# Patient Record
Sex: Female | Born: 1945
Health system: Southern US, Community
[De-identification: ages and names within clinical notes are randomized; demographics above are authoritative.]

## PROBLEM LIST (undated history)

## (undated) DIAGNOSIS — I10 Essential (primary) hypertension: Secondary | ICD-10-CM

## (undated) DIAGNOSIS — I5032 Chronic diastolic (congestive) heart failure: Secondary | ICD-10-CM

## (undated) DIAGNOSIS — I2510 Atherosclerotic heart disease of native coronary artery without angina pectoris: Secondary | ICD-10-CM

## (undated) DIAGNOSIS — R0609 Other forms of dyspnea: Secondary | ICD-10-CM

## (undated) DIAGNOSIS — R001 Bradycardia, unspecified: Secondary | ICD-10-CM

## (undated) DIAGNOSIS — Z7982 Long term (current) use of aspirin: Secondary | ICD-10-CM

## (undated) DIAGNOSIS — C4490 Unspecified malignant neoplasm of skin, unspecified: Secondary | ICD-10-CM

## (undated) DIAGNOSIS — M5136 Other intervertebral disc degeneration, lumbar region: Secondary | ICD-10-CM

## (undated) DIAGNOSIS — M51369 Other intervertebral disc degeneration, lumbar region without mention of lumbar back pain or lower extremity pain: Secondary | ICD-10-CM

## (undated) DIAGNOSIS — G8929 Other chronic pain: Secondary | ICD-10-CM

## (undated) DIAGNOSIS — I700 Atherosclerosis of aorta: Secondary | ICD-10-CM

## (undated) DIAGNOSIS — M1990 Unspecified osteoarthritis, unspecified site: Secondary | ICD-10-CM

## (undated) DIAGNOSIS — M5416 Radiculopathy, lumbar region: Secondary | ICD-10-CM

## (undated) DIAGNOSIS — M4317 Spondylolisthesis, lumbosacral region: Secondary | ICD-10-CM

## (undated) DIAGNOSIS — I739 Peripheral vascular disease, unspecified: Secondary | ICD-10-CM

## (undated) DIAGNOSIS — E7800 Pure hypercholesterolemia, unspecified: Secondary | ICD-10-CM

## (undated) DIAGNOSIS — M75121 Complete rotator cuff tear or rupture of right shoulder, not specified as traumatic: Secondary | ICD-10-CM

## (undated) DIAGNOSIS — M4310 Spondylolisthesis, site unspecified: Secondary | ICD-10-CM

## (undated) DIAGNOSIS — I779 Disorder of arteries and arterioles, unspecified: Secondary | ICD-10-CM

## (undated) DIAGNOSIS — C801 Malignant (primary) neoplasm, unspecified: Secondary | ICD-10-CM

## (undated) DIAGNOSIS — G4700 Insomnia, unspecified: Secondary | ICD-10-CM

## (undated) DIAGNOSIS — R002 Palpitations: Secondary | ICD-10-CM

## (undated) DIAGNOSIS — M79606 Pain in leg, unspecified: Secondary | ICD-10-CM

## (undated) DIAGNOSIS — R0600 Dyspnea, unspecified: Secondary | ICD-10-CM

## (undated) DIAGNOSIS — G3184 Mild cognitive impairment, so stated: Secondary | ICD-10-CM

## (undated) DIAGNOSIS — M5410 Radiculopathy, site unspecified: Secondary | ICD-10-CM

## (undated) DIAGNOSIS — R5382 Chronic fatigue, unspecified: Secondary | ICD-10-CM

## (undated) HISTORY — DX: Other chronic pain: G89.29

## (undated) HISTORY — DX: Peripheral vascular disease, unspecified: I73.9

## (undated) HISTORY — DX: Essential (primary) hypertension: I10

## (undated) HISTORY — DX: Bradycardia, unspecified: R00.1

## (undated) HISTORY — DX: Other forms of dyspnea: R06.09

## (undated) HISTORY — DX: Other intervertebral disc degeneration, lumbar region without mention of lumbar back pain or lower extremity pain: M51.369

## (undated) HISTORY — DX: Palpitations: R00.2

## (undated) HISTORY — PX: RECONSTRUCTION OF NOSE: SHX2301

## (undated) HISTORY — DX: Radiculopathy, site unspecified: M54.10

## (undated) HISTORY — DX: Dyspnea, unspecified: R06.00

## (undated) HISTORY — DX: Pure hypercholesterolemia, unspecified: E78.00

## (undated) HISTORY — DX: Pain in leg, unspecified: M79.606

## (undated) HISTORY — DX: Other intervertebral disc degeneration, lumbar region: M51.36

## (undated) HISTORY — DX: Disorder of arteries and arterioles, unspecified: I77.9

## (undated) HISTORY — PX: BACK SURGERY: SHX140

## (undated) HISTORY — DX: Chronic fatigue, unspecified: R53.82

## (undated) HISTORY — DX: Spondylolisthesis, site unspecified: M43.10

## (undated) HISTORY — DX: Insomnia, unspecified: G47.00

---

## 1973-10-21 HISTORY — PX: ABDOMINAL HYSTERECTOMY: SHX81

## 2004-10-01 ENCOUNTER — Ambulatory Visit: Payer: Self-pay | Admitting: Internal Medicine

## 2005-10-01 ENCOUNTER — Ambulatory Visit: Payer: Self-pay | Admitting: Internal Medicine

## 2005-12-04 ENCOUNTER — Ambulatory Visit: Payer: Self-pay | Admitting: Otolaryngology

## 2006-10-07 ENCOUNTER — Ambulatory Visit: Payer: Self-pay | Admitting: Internal Medicine

## 2007-11-03 ENCOUNTER — Ambulatory Visit: Payer: Self-pay | Admitting: Internal Medicine

## 2008-11-03 ENCOUNTER — Ambulatory Visit: Payer: Self-pay | Admitting: Internal Medicine

## 2010-09-11 ENCOUNTER — Ambulatory Visit: Payer: Self-pay | Admitting: Internal Medicine

## 2011-11-03 LAB — HM MAMMOGRAPHY

## 2011-11-12 ENCOUNTER — Ambulatory Visit: Payer: Self-pay | Admitting: Internal Medicine

## 2012-01-12 ENCOUNTER — Ambulatory Visit: Payer: Self-pay | Admitting: Internal Medicine

## 2012-01-12 LAB — RAPID STREP-A WITH REFLX: Micro Text Report: NEGATIVE

## 2012-01-14 LAB — BETA STREP CULTURE(ARMC)

## 2012-08-19 ENCOUNTER — Other Ambulatory Visit (HOSPITAL_COMMUNITY)
Admission: RE | Admit: 2012-08-19 | Discharge: 2012-08-19 | Disposition: A | Discharge status: Home / Self Care | Payer: Medicare Other | Source: Ambulatory Visit | Attending: Internal Medicine | Admitting: Internal Medicine

## 2012-08-19 ENCOUNTER — Ambulatory Visit (INDEPENDENT_AMBULATORY_CARE_PROVIDER_SITE_OTHER): Payer: Medicare Other | Admitting: Internal Medicine

## 2012-08-19 ENCOUNTER — Encounter: Payer: Self-pay | Admitting: Internal Medicine

## 2012-08-19 VITALS — BP 128/76 | HR 67 | Temp 97.0°F | Ht 65.0 in | Wt 180.0 lb

## 2012-08-19 DIAGNOSIS — E7800 Pure hypercholesterolemia, unspecified: Secondary | ICD-10-CM

## 2012-08-19 DIAGNOSIS — Z1151 Encounter for screening for human papillomavirus (HPV): Secondary | ICD-10-CM | POA: Insufficient documentation

## 2012-08-19 DIAGNOSIS — R51 Headache: Secondary | ICD-10-CM | POA: Diagnosis not present

## 2012-08-19 DIAGNOSIS — Z01419 Encounter for gynecological examination (general) (routine) without abnormal findings: Secondary | ICD-10-CM

## 2012-08-19 DIAGNOSIS — I10 Essential (primary) hypertension: Secondary | ICD-10-CM | POA: Diagnosis not present

## 2012-08-19 DIAGNOSIS — R5381 Other malaise: Secondary | ICD-10-CM | POA: Diagnosis not present

## 2012-08-19 DIAGNOSIS — R5383 Other fatigue: Secondary | ICD-10-CM | POA: Diagnosis not present

## 2012-08-19 DIAGNOSIS — Z124 Encounter for screening for malignant neoplasm of cervix: Secondary | ICD-10-CM | POA: Diagnosis not present

## 2012-08-19 LAB — COMPREHENSIVE METABOLIC PANEL
ALT: 23 U/L (ref 0–35)
AST: 21 U/L (ref 0–37)
Albumin: 3.7 g/dL (ref 3.5–5.2)
Alkaline Phosphatase: 68 U/L (ref 39–117)
BUN: 13 mg/dL (ref 6–23)
CO2: 27 mEq/L (ref 19–32)
Calcium: 9.7 mg/dL (ref 8.4–10.5)
Chloride: 104 mEq/L (ref 96–112)
Creatinine, Ser: 0.6 mg/dL (ref 0.4–1.2)
GFR: 108.62 mL/min (ref >60.00)
Glucose, Bld: 80 mg/dL (ref 70–99)
Potassium: 3.9 mEq/L (ref 3.5–5.1)
Sodium: 139 mEq/L (ref 135–145)
Total Bilirubin: 0.9 mg/dL (ref 0.3–1.2)
Total Protein: 7 g/dL (ref 6.0–8.3)

## 2012-08-19 LAB — CBC WITH DIFFERENTIAL/PLATELET
Basophils Absolute: 0.1 10*3/uL (ref 0.0–0.1)
Basophils Relative: 1 % (ref 0.0–3.0)
Eosinophils Absolute: 0.1 10*3/uL (ref 0.0–0.7)
Eosinophils Relative: 1.5 % (ref 0.0–5.0)
HCT: 38.4 % (ref 36.0–46.0)
Hemoglobin: 12.7 g/dL (ref 12.0–15.0)
Lymphocytes Relative: 22.7 % (ref 12.0–46.0)
Lymphs Abs: 1.4 10*3/uL (ref 0.7–4.0)
MCHC: 33 g/dL (ref 30.0–36.0)
MCV: 90.3 fl (ref 78.0–100.0)
Monocytes Absolute: 0.4 10*3/uL (ref 0.1–1.0)
Monocytes Relative: 6.7 % (ref 3.0–12.0)
Neutro Abs: 4.1 10*3/uL (ref 1.4–7.7)
Neutrophils Relative %: 68.1 % (ref 43.0–77.0)
Platelets: 256 10*3/uL (ref 150.0–400.0)
RBC: 4.25 Mil/uL (ref 3.87–5.11)
RDW: 12.9 % (ref 11.5–14.6)
WBC: 6 10*3/uL (ref 4.5–10.5)

## 2012-08-19 LAB — TSH: TSH: 3.6 u[IU]/mL (ref 0.35–5.50)

## 2012-08-19 LAB — SEDIMENTATION RATE: Sed Rate: 25 mm/hr — ABNORMAL HIGH (ref 0–22)

## 2012-08-19 NOTE — Patient Instructions (Addendum)
It was nice seeing you today.  I am glad you have been doing well.  I am going to check labs today and we will notify you of your results - once they are available.

## 2012-08-23 ENCOUNTER — Encounter: Payer: Self-pay | Admitting: Internal Medicine

## 2012-08-23 DIAGNOSIS — E785 Hyperlipidemia, unspecified: Secondary | ICD-10-CM | POA: Insufficient documentation

## 2012-08-23 DIAGNOSIS — I10 Essential (primary) hypertension: Secondary | ICD-10-CM | POA: Insufficient documentation

## 2012-08-23 NOTE — Assessment & Plan Note (Addendum)
Blood pressure elevated.  Discussed changing her meds.  She desires not to change meds at this time.  Will have her monitor her blood pressure and send in readings.  If persistent elevation, will require adjustments in her meds. She declines earlier appt to follow up on her blood pressure.  Assures me she will send in readings.

## 2012-08-23 NOTE — Assessment & Plan Note (Addendum)
History of elevated cholesterol.  Low cholesterol diet and exercise.  Desires not to take a statin.  Check lipid panel with next fasting labs.

## 2012-08-23 NOTE — Progress Notes (Signed)
  Subjective:    Patient ID: Kristen Strickland, female    DOB: 1945-12-20, 66 y.o.   MRN: [id number]  HPI 66 year old female with past history of hypertension and hypercholesterolemia who comes in today to follow up on these issues as well as for a complete physical exam.  States she has been doing relatively well.  She has noticed headache.  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  This resolved.  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  Intermittent.  None today.  No sinus symptoms.  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  No vision change.  Ibuprofen helps.  No nausea or vomiting.  No abdominal pain.    Past Medical History  Diagnosis Date  . Hypercholesterolemia   . Hypertension     Review of Systems Headache as outlined.  None today.  No lightheadedness or dizziness.  No vision change.  No sinus symptoms.   No chest pain, tightness or palpitations.  No increased shortness of breath, cough or congestion.  No nausea or vomiting.  No abdominal pain or cramping.  No bowel change, such as diarrhea, constipation, BRBPR or melana.  No urine change.        Objective:   Physical Exam Filed Vitals:   08/19/12 0920  BP: 128/76  Pulse: 67  Temp: 97 F (69.103 C)   66 year old female in no acute distress.   HEENT:  Nares- clear.  Oropharynx - without lesions.  No sinus tenderness to palpation.  No tenderness to palpation over the temporal arteries.   NECK:  Supple.  Nontender.  No audible bruit.  HEART:  Appears to be regular. LUNGS:  No crackles or wheezing audible.  Respirations even and unlabored.  RADIAL PULSE:  Equal bilaterally.    BREASTS:  No nipple discharge or nipple retraction present.  Could not appreciate any distinct nodules or axillary adenopathy.  ABDOMEN:  Soft, nontender.  Bowel sounds present and normal.  No audible abdominal bruit.  GU:  Normal external genitalia.  Vaginal vault without lesions.  S/P hysterectomy.  Pap  performed. Could not appreciate any adnexal masses or tenderness.   RECTAL:  Heme negative.   EXTREMITIES:  No increased edema present.  DP pulses palpable and equal bilaterally.           Assessment & Plan:  HEADACHE.  Discussed at length with her today.  No significant sinus or allergy triggers.  No vision change.  Discussed need to get her eyes examined.  Check cbc and ESR.  Discussed scanning.  She declines.  Wants to monitor.  Will notify me or be reevaluated if persistent or change.    GYN.  Pelvic and pap today.    FATIGUE.  Check cbc, met c and tsh.   HEALTH MAINTENANCE.  Physical today.  Schedule mammogram.  Check cholesterol.  She declines colonoscopy.  Declines flu shot.

## 2012-09-01 ENCOUNTER — Encounter: Payer: Self-pay | Admitting: *Deleted

## 2012-09-02 ENCOUNTER — Encounter: Payer: Self-pay | Admitting: *Deleted

## 2012-09-04 ENCOUNTER — Encounter: Payer: Self-pay | Admitting: *Deleted

## 2012-09-04 NOTE — Progress Notes (Signed)
Result letter mailed to patient's home address.

## 2012-09-07 IMAGING — MG MM DIGITAL SCREENING BILAT W/ CAD
1 series · 4 of 4 positions shown · non-contrast
Comparison: none

REASON FOR EXAM: screening
COMMENTS:  Submitted by practice: [facility] [HOSPITAL] Scheduled by
[REDACTED]

PROCEDURE:     MMM - MMM DGTL SCREENING MAMMO W/CAD  - November 12, 2011  [DATE]
RESULT:     No dominant masses or pathologic clustered calcifications are
demonstrated.  The exam is stable from prior exams. CAD evaluation is
non-focal.

[Series 4875: R CC · right · 4 of 4 slices shown]
[im 1/4]
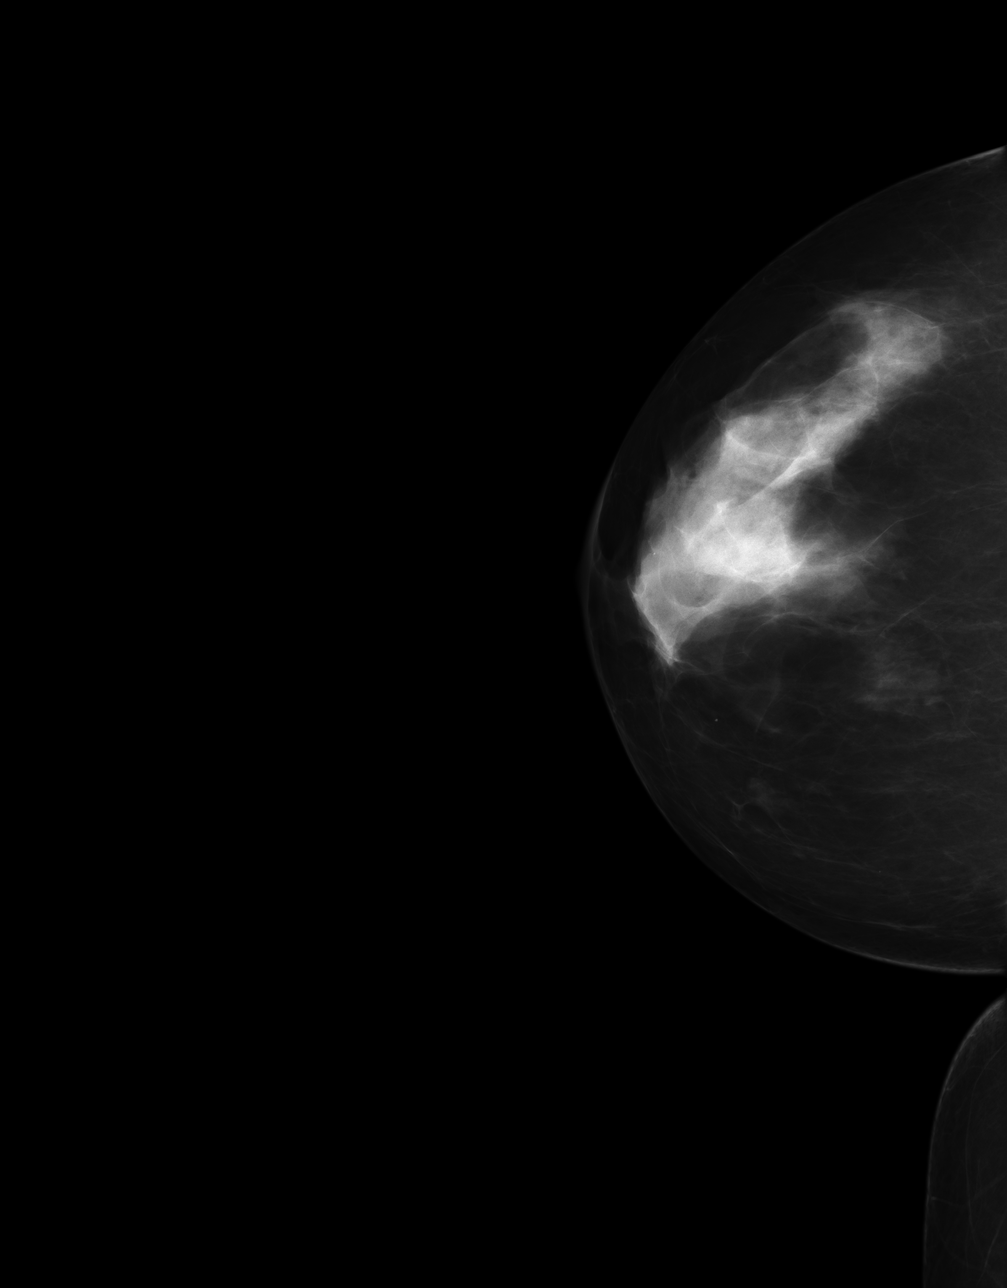
[im 2/4]
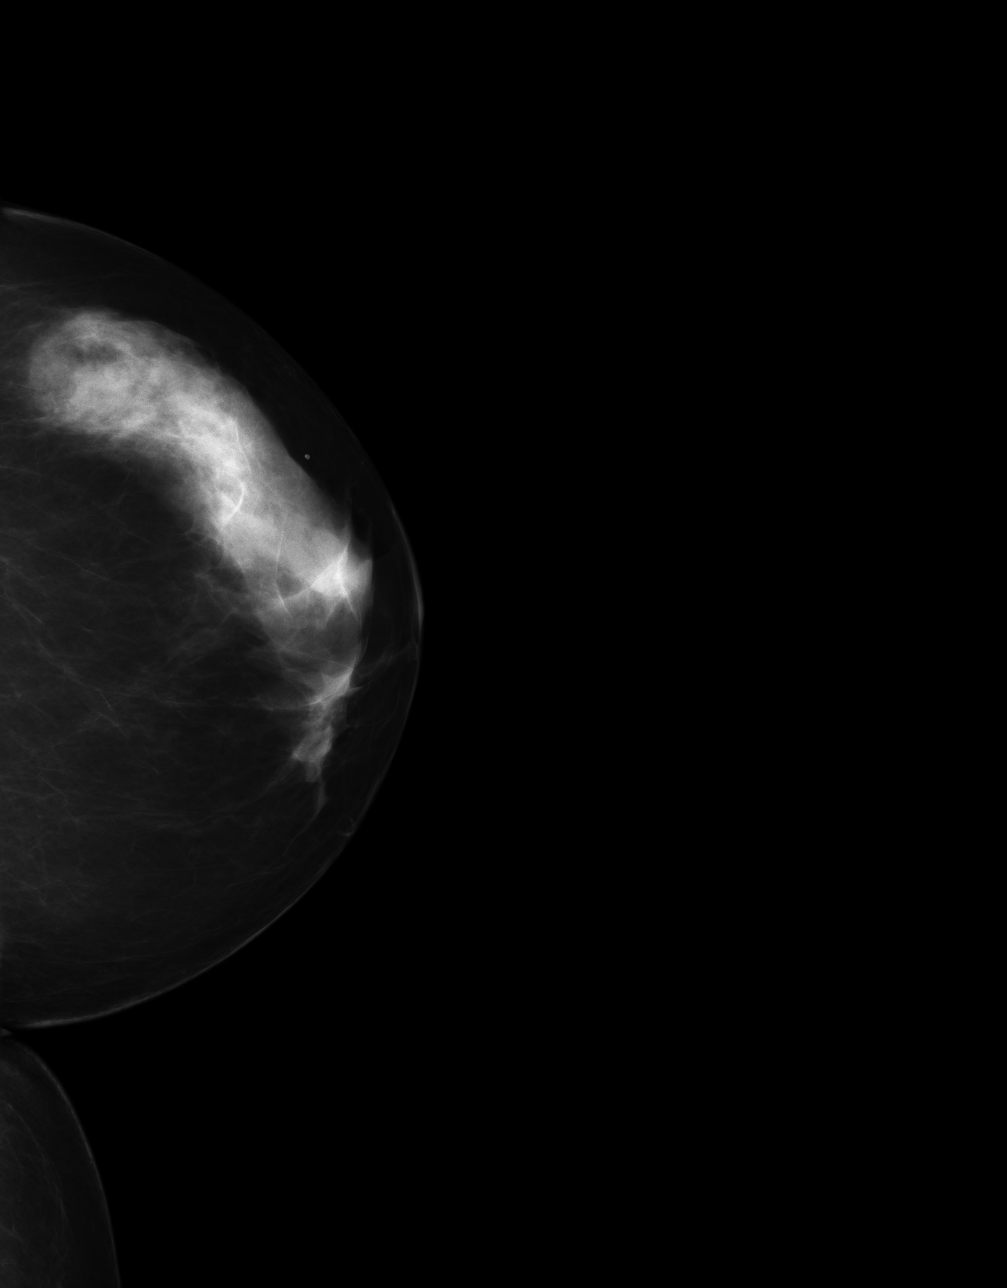
[im 3/4]
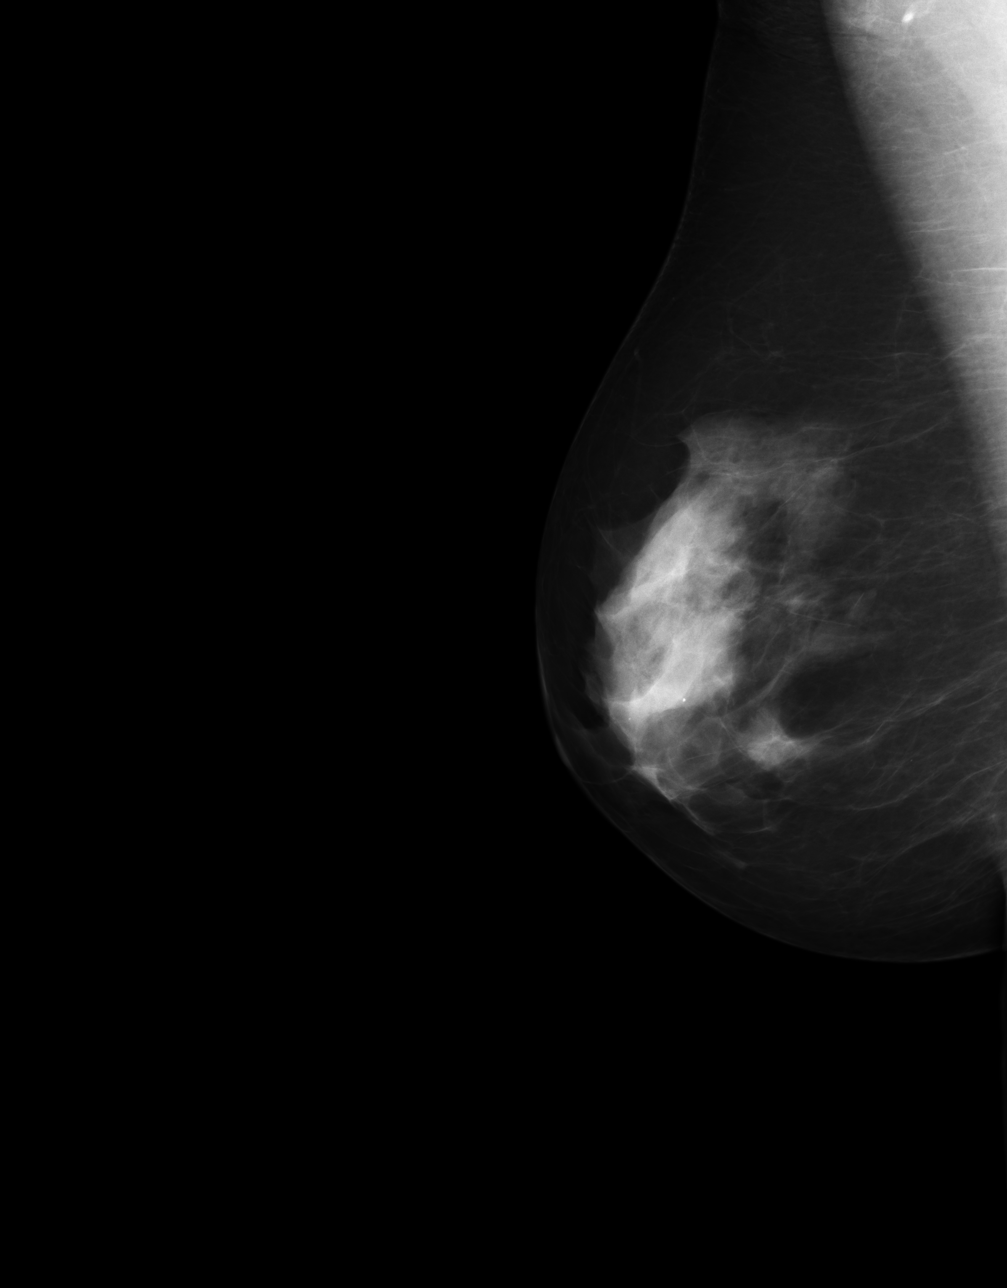
[im 4/4]
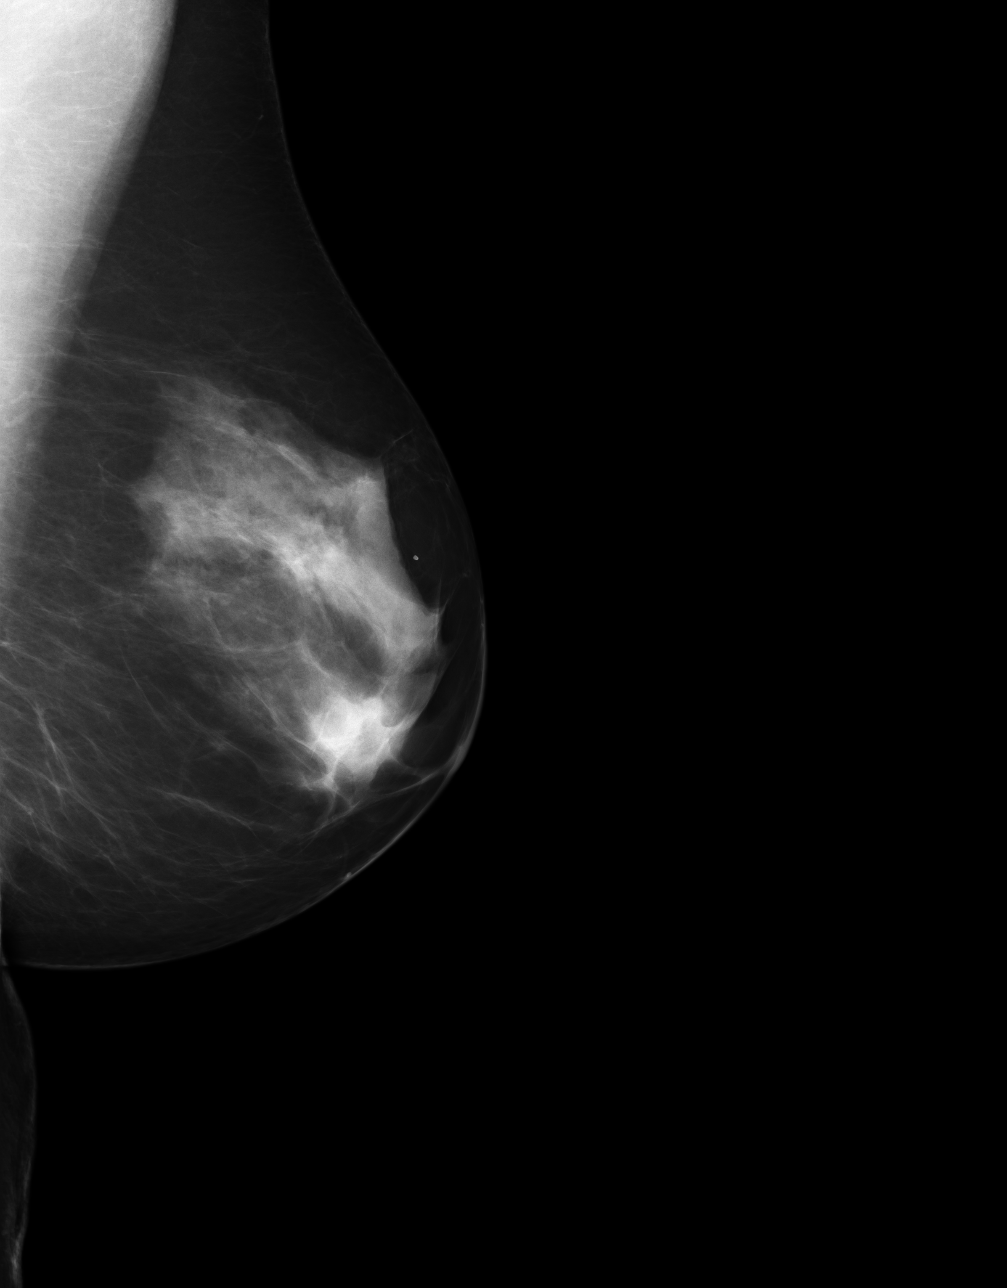

[4 of 4 positions shown; findings below may reference images not displayed]

IMPRESSION: 1.Benign exam. Routine yearly follow-up mammograms suggested.

BI-RADS: Category 2 - Benign Findings

A NEGATIVE MAMMOGRAM REPORT DOES NOT PRECLUDE BIOPSY OR OTHER EVALUATION OF
A CLINICALLY PALPABLE OR OTHERWISE SUSPICIOUS MASS OR LESION. BREAST CANCER
MAY NOT BE DETECTED BY MAMMOGRAPHY IN UP TO 10% OF CASES.

## 2012-11-23 ENCOUNTER — Telehealth: Payer: Self-pay | Admitting: *Deleted

## 2012-11-23 NOTE — Telephone Encounter (Signed)
Patient called stating that she is due to have a mammogram and blood work. Please advise.

## 2012-11-24 ENCOUNTER — Other Ambulatory Visit: Payer: Self-pay | Admitting: Internal Medicine

## 2012-11-24 DIAGNOSIS — Z139 Encounter for screening, unspecified: Secondary | ICD-10-CM

## 2012-11-24 DIAGNOSIS — E7800 Pure hypercholesterolemia, unspecified: Secondary | ICD-10-CM

## 2012-11-24 NOTE — Telephone Encounter (Signed)
I have placed order for both.  Amber should be calling her with a mammogram time and you will need to schedule her for a fasting lab time.  Thanks.

## 2012-11-25 NOTE — Telephone Encounter (Signed)
Left message for patient to call back to schedule labs

## 2012-12-01 ENCOUNTER — Ambulatory Visit: Payer: Self-pay | Admitting: Internal Medicine

## 2012-12-01 DIAGNOSIS — Z1231 Encounter for screening mammogram for malignant neoplasm of breast: Secondary | ICD-10-CM | POA: Diagnosis not present

## 2012-12-04 ENCOUNTER — Encounter: Payer: Self-pay | Admitting: Internal Medicine

## 2012-12-16 ENCOUNTER — Other Ambulatory Visit (INDEPENDENT_AMBULATORY_CARE_PROVIDER_SITE_OTHER): Payer: Medicare Other

## 2012-12-16 DIAGNOSIS — E7800 Pure hypercholesterolemia, unspecified: Secondary | ICD-10-CM | POA: Diagnosis not present

## 2012-12-16 LAB — LIPID PANEL
Cholesterol: 217 mg/dL — ABNORMAL HIGH (ref 0–200)
HDL: 49.4 mg/dL (ref >39.00)
Total CHOL/HDL Ratio: 4
Triglycerides: 136 mg/dL (ref 0.0–149.0)
VLDL: 27.2 mg/dL (ref 0.0–40.0)

## 2012-12-16 LAB — LDL CHOLESTEROL, DIRECT: Direct LDL: 149 mg/dL

## 2012-12-18 ENCOUNTER — Telehealth: Payer: Self-pay | Admitting: Internal Medicine

## 2012-12-18 ENCOUNTER — Encounter: Payer: Self-pay | Admitting: *Deleted

## 2012-12-18 NOTE — Telephone Encounter (Signed)
error 

## 2012-12-21 ENCOUNTER — Encounter: Payer: Self-pay | Admitting: Internal Medicine

## 2013-01-01 ENCOUNTER — Other Ambulatory Visit: Payer: Self-pay | Admitting: *Deleted

## 2013-01-01 MED ORDER — ESTRADIOL 0.1 MG/24HR TD PTWK: 1.0000 | MEDICATED_PATCH | TRANSDERMAL | Status: DC

## 2013-01-01 MED ORDER — HYDROCHLOROTHIAZIDE 25 MG PO TABS: 25.0000 mg | ORAL_TABLET | Freq: Every day | ORAL | Status: DC

## 2013-01-01 NOTE — Telephone Encounter (Signed)
Sent in to pharmacy.  

## 2013-02-09 ENCOUNTER — Encounter: Payer: Self-pay | Admitting: Internal Medicine

## 2013-02-09 ENCOUNTER — Ambulatory Visit (INDEPENDENT_AMBULATORY_CARE_PROVIDER_SITE_OTHER): Payer: Medicare Other | Admitting: Internal Medicine

## 2013-02-09 VITALS — BP 120/72 | HR 65 | Temp 98.5°F | Ht 65.0 in | Wt 158.8 lb

## 2013-02-09 DIAGNOSIS — I10 Essential (primary) hypertension: Secondary | ICD-10-CM

## 2013-02-09 DIAGNOSIS — R5383 Other fatigue: Secondary | ICD-10-CM

## 2013-02-09 DIAGNOSIS — R5381 Other malaise: Secondary | ICD-10-CM | POA: Diagnosis not present

## 2013-02-09 DIAGNOSIS — Z13220 Encounter for screening for lipoid disorders: Secondary | ICD-10-CM | POA: Diagnosis not present

## 2013-02-09 DIAGNOSIS — E7800 Pure hypercholesterolemia, unspecified: Secondary | ICD-10-CM | POA: Diagnosis not present

## 2013-02-15 ENCOUNTER — Encounter: Payer: Self-pay | Admitting: Internal Medicine

## 2013-02-15 NOTE — Progress Notes (Signed)
  Subjective:    Patient ID: Kristen Strickland, female    DOB: [birth year], 67 y.o.   MRN: [id number]  HPI 67 year old female with past history of hypertension and hypercholesterolemia who comes in today for a scheduled follow up.  States she has been doing relatively well.  Stays active.  No cardiac symptoms with increased activity or exertion.  Breathing stable.  No acid reflux.  Bowels stable.  Discussed cholesterol.  Discussed diet and exercise.  She desires not to take medication.    Past Medical History  Diagnosis Date  . Hypercholesterolemia   . Hypertension     Current Outpatient Prescriptions on File Prior to Visit  Medication Sig Dispense Refill  . estradiol (CLIMARA - DOSED IN MG/24 HR) 0.1 mg/24hr Place 1 patch (0.1 mg total) onto the skin once a week.  4 patch  5  . hydrochlorothiazide (HYDRODIURIL) 25 MG tablet Take 1 tablet (25 mg total) by mouth daily.  30 tablet  5   No current facility-administered medications on file prior to visit.    Review of Systems No headache.  No lightheadedness or dizziness.  No vision change.  No sinus symptoms.   No chest pain, tightness or palpitations.  No increased shortness of breath, cough or congestion.  No nausea or vomiting.  No acid reflux.  No abdominal pain or cramping.  No bowel change, such as diarrhea, constipation, BRBPR or melana.  No urine change.  Overall she feels she is doing relatively well.       Objective:   Physical Exam  Filed Vitals:   02/09/13 1023  BP: 120/72  Pulse: 65  Temp: 98.5 F (53.82 C)   67 year old female in no acute distress.   HEENT:  Nares- clear.  Oropharynx - without lesions.  No sinus tenderness to palpation.  No tenderness to palpation over the temporal arteries.   NECK:  Supple.  Nontender.  No audible bruit.  HEART:  Appears to be regular. LUNGS:  No crackles or wheezing audible.  Respirations even and unlabored.  RADIAL PULSE:  Equal bilaterally.  ABDOMEN:  Soft, nontender.  Bowel sounds  present and normal.  No audible abdominal bruit.   EXTREMITIES:  No increased edema present.  DP pulses palpable and equal bilaterally.           Assessment & Plan:  HEADACHE.  Resolved.  Not an issue now.    GYN.  Pelvic and pap 08/19/12.  Negative.     HEALTH MAINTENANCE.  Physical 08/19/12.  Mammogram 12/01/12 - Birads II.  Pap negative.  She declines colonoscopy.

## 2013-02-15 NOTE — Assessment & Plan Note (Signed)
History of elevated cholesterol.  Low cholesterol diet and exercise.  Desires not to take a statin.  Follow lipid profile.    

## 2013-02-15 NOTE — Assessment & Plan Note (Signed)
Blood pressure doing better.  Follow.  Continue hctz.  Follow metabolic panel.

## 2013-03-16 ENCOUNTER — Encounter: Payer: Self-pay | Admitting: Internal Medicine

## 2013-03-16 ENCOUNTER — Ambulatory Visit (INDEPENDENT_AMBULATORY_CARE_PROVIDER_SITE_OTHER): Payer: Medicare Other | Admitting: Internal Medicine

## 2013-03-16 VITALS — BP 160/90 | HR 67 | Temp 98.1°F | Ht 65.0 in | Wt 157.5 lb

## 2013-03-16 DIAGNOSIS — R5383 Other fatigue: Secondary | ICD-10-CM

## 2013-03-16 DIAGNOSIS — M542 Cervicalgia: Secondary | ICD-10-CM

## 2013-03-16 DIAGNOSIS — M79609 Pain in unspecified limb: Secondary | ICD-10-CM | POA: Diagnosis not present

## 2013-03-16 DIAGNOSIS — R209 Unspecified disturbances of skin sensation: Secondary | ICD-10-CM

## 2013-03-16 DIAGNOSIS — I10 Essential (primary) hypertension: Secondary | ICD-10-CM | POA: Diagnosis not present

## 2013-03-16 DIAGNOSIS — Z13220 Encounter for screening for lipoid disorders: Secondary | ICD-10-CM

## 2013-03-16 DIAGNOSIS — M79601 Pain in right arm: Secondary | ICD-10-CM

## 2013-03-16 DIAGNOSIS — R5381 Other malaise: Secondary | ICD-10-CM

## 2013-03-16 DIAGNOSIS — R200 Anesthesia of skin: Secondary | ICD-10-CM

## 2013-03-16 LAB — CBC WITH DIFFERENTIAL/PLATELET
Basophils Absolute: 0.1 10*3/uL (ref 0.0–0.1)
Basophils Relative: 1.4 % (ref 0.0–3.0)
Eosinophils Absolute: 0.1 10*3/uL (ref 0.0–0.7)
Eosinophils Relative: 2.4 % (ref 0.0–5.0)
HCT: 40.2 % (ref 36.0–46.0)
Hemoglobin: 13.8 g/dL (ref 12.0–15.0)
Lymphocytes Relative: 26.8 % (ref 12.0–46.0)
Lymphs Abs: 1.5 10*3/uL (ref 0.7–4.0)
MCHC: 34.4 g/dL (ref 30.0–36.0)
MCV: 88.8 fl (ref 78.0–100.0)
Monocytes Absolute: 0.4 10*3/uL (ref 0.1–1.0)
Monocytes Relative: 7.1 % (ref 3.0–12.0)
Neutro Abs: 3.5 10*3/uL (ref 1.4–7.7)
Neutrophils Relative %: 62.3 % (ref 43.0–77.0)
Platelets: 245 10*3/uL (ref 150.0–400.0)
RBC: 4.53 Mil/uL (ref 3.87–5.11)
RDW: 12.9 % (ref 11.5–14.6)
WBC: 5.7 10*3/uL (ref 4.5–10.5)

## 2013-03-16 LAB — COMPREHENSIVE METABOLIC PANEL
ALT: 24 U/L (ref 0–35)
AST: 17 U/L (ref 0–37)
Albumin: 3.7 g/dL (ref 3.5–5.2)
Alkaline Phosphatase: 48 U/L (ref 39–117)
BUN: 17 mg/dL (ref 6–23)
CO2: 25 mEq/L (ref 19–32)
Calcium: 9.4 mg/dL (ref 8.4–10.5)
Chloride: 104 mEq/L (ref 96–112)
Creatinine, Ser: 0.9 mg/dL (ref 0.4–1.2)
GFR: 68.35 mL/min (ref >60.00)
Glucose, Bld: 100 mg/dL — ABNORMAL HIGH (ref 70–99)
Potassium: 3.6 mEq/L (ref 3.5–5.1)
Sodium: 139 mEq/L (ref 135–145)
Total Bilirubin: 0.7 mg/dL (ref 0.3–1.2)
Total Protein: 6.7 g/dL (ref 6.0–8.3)

## 2013-03-16 LAB — TSH: TSH: 3.25 u[IU]/mL (ref 0.35–5.50)

## 2013-03-17 ENCOUNTER — Encounter: Payer: Self-pay | Admitting: *Deleted

## 2013-03-18 ENCOUNTER — Ambulatory Visit (HOSPITAL_COMMUNITY)
Admission: RE | Admit: 2013-03-18 | Discharge: 2013-03-18 | Disposition: A | Discharge status: Home / Self Care | Payer: Medicare Other | Source: Ambulatory Visit | Attending: Internal Medicine | Admitting: Internal Medicine

## 2013-03-18 ENCOUNTER — Encounter: Payer: Self-pay | Admitting: Internal Medicine

## 2013-03-18 ENCOUNTER — Telehealth: Payer: Self-pay | Admitting: Internal Medicine

## 2013-03-18 DIAGNOSIS — M79601 Pain in right arm: Secondary | ICD-10-CM

## 2013-03-18 DIAGNOSIS — M79609 Pain in unspecified limb: Secondary | ICD-10-CM | POA: Diagnosis not present

## 2013-03-18 DIAGNOSIS — M47812 Spondylosis without myelopathy or radiculopathy, cervical region: Secondary | ICD-10-CM | POA: Insufficient documentation

## 2013-03-18 DIAGNOSIS — R200 Anesthesia of skin: Secondary | ICD-10-CM

## 2013-03-18 DIAGNOSIS — M542 Cervicalgia: Secondary | ICD-10-CM

## 2013-03-18 DIAGNOSIS — M4802 Spinal stenosis, cervical region: Secondary | ICD-10-CM | POA: Insufficient documentation

## 2013-03-18 DIAGNOSIS — R29898 Other symptoms and signs involving the musculoskeletal system: Secondary | ICD-10-CM | POA: Diagnosis not present

## 2013-03-18 DIAGNOSIS — M5030 Other cervical disc degeneration, unspecified cervical region: Secondary | ICD-10-CM | POA: Diagnosis not present

## 2013-03-18 MED ORDER — GADOBENATE DIMEGLUMINE 529 MG/ML IV SOLN
15.0000 mL | Freq: Once | INTRAVENOUS | Status: AC
  Administered 2013-03-18: 15 mL via INTRAVENOUS

## 2013-03-18 NOTE — Telephone Encounter (Signed)
I gave her a prescription for a medrol dose pack (or she should have received). If not then call in medrol dose pack 6 day taper.  She should already be on this though.  Regarding her surgery appt -- Kristen Strickland should be contacting her with this appt.

## 2013-03-18 NOTE — Telephone Encounter (Signed)
Patient left a message on voicemail stating she need some prednisone called in and would like someone to call her. Also she would like to know if you have heard anything about the surgery.

## 2013-03-18 NOTE — Assessment & Plan Note (Signed)
Blood pressure elevated today.  She is in pain and is not resting.  Treat her arm pain as outlined.  Follow pressures.  Notify me if persistent elevation.

## 2013-03-18 NOTE — Telephone Encounter (Signed)
Agree/Noted. 

## 2013-03-18 NOTE — Progress Notes (Signed)
Subjective:    Patient ID: Kristen Strickland, female    DOB: [birth year], 67 y.o.   MRN: [id number]  Arm Pain   67 year old female with past history of hypertension and hypercholesterolemia who comes in today as a work in with concerns regarding right arm pain and neck pain.  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  She states the pain has persisted.  Some neck discomfort.  Able to turn head without significant difficulty.  Increased arm pain. Not able to rest.  No shoulder pain.  Does report some numbness in her hand.  Now experiencing pain in her right thumb and index finger.  Pain mostly localized above the elbow. Took her daughter's Flexeril.  No relief.  Taking antiinflammatories.  No relief.   No cardiac symptoms with increased activity or exertion.  Breathing stable.  No acid reflux.    Past Medical History  Diagnosis Date  . Hypercholesterolemia   . Hypertension     Current Outpatient Prescriptions on File Prior to Visit  Medication Sig Dispense Refill  . estradiol (CLIMARA - DOSED IN MG/24 HR) 0.1 mg/24hr Place 1 patch (0.1 mg total) onto the skin once a week.  4 patch  5  . hydrochlorothiazide (HYDRODIURIL) 25 MG tablet Take 1 tablet (25 mg total) by mouth daily.  30 tablet  5  . ibuprofen (ADVIL,MOTRIN) 200 MG tablet Take 200 mg by mouth daily.      . Multiple Vitamins-Minerals (CENTRUM PO) Take by mouth daily.       No current facility-administered medications on file prior to visit.    Review of Systems No headache.  No lightheadedness or dizziness.  No chest pain, tightness or palpitations. No acid reflux.  Some neck pain and right arm pain as outlined.  Not resting.  OTC meds not helping.  Flexeril not helping.  Hand numbness previously.  Thumb and index finger numbness now.  Only relief of her pain is when she lifts her arm over her head.       Objective:   Physical Exam  Filed Vitals:   03/16/13 0915   BP: 160/90  Pulse: 67  Temp: 98.1 F (5.44 C)   67 year old female in no acute distress.   HEENT:  Nares- clear.  Oropharynx - without lesions.   NECK:  Supple.  Minimal tenderness posterior neck.  No audible bruit.  Appears to have good rom.  HEART:  Appears to be regular. LUNGS:  No crackles or wheezing audible.  Respirations even and unlabored.  RADIAL PULSE:  Equal bilaterally.  MSK:  Appears to have good rom in her neck and right shoulder.  No pain to palpation over the arm.  Only relief is raising her arm above her head.             Assessment & Plan:  MSK.  Neck and arm pain and hand numbness as outlined.  Only relief is when she lifts her arm over her head.  Not resting.  No known injury.  Not responding to otc antiinflammatories.  Not responding to Flexeril.  Will give her a medrol dose pack.  Check MRI C-spine.  Refer to Dr Jeral Fruit at pts request.   HEADACHE.  Resolved.  Not an issue now.    GYN.  Pelvic and pap 08/19/12.  Negative.     HEALTH MAINTENANCE.  Physical 08/19/12.  Mammogram 12/01/12 - Birads II.  Pap negative.  She declines colonoscopy.

## 2013-03-18 NOTE — Telephone Encounter (Signed)
FYI: Pt is currently on the Medrol Dose pack, but states it will run out on Sunday. She states she feels better when she takes a dose, but doesn't feel that 6 days will be enough. I informed pt the Amber will give her a call Re: referral & I told the patient to call back on Monday with an update on her symptoms after she has completed her round of Prednisone

## 2013-03-19 NOTE — Progress Notes (Signed)
MRI report was faxed with referral to [facility] this morning.

## 2013-03-24 ENCOUNTER — Encounter: Payer: Self-pay | Admitting: Internal Medicine

## 2013-03-25 ENCOUNTER — Telehealth: Payer: Self-pay | Admitting: *Deleted

## 2013-03-25 MED ORDER — METHYLPREDNISOLONE 4 MG PO KIT: PACK | ORAL | Status: DC

## 2013-03-25 NOTE — Telephone Encounter (Signed)
Refilled pt informed.  

## 2013-03-25 NOTE — Telephone Encounter (Signed)
Just a FYI re: referral

## 2013-03-25 NOTE — Telephone Encounter (Signed)
Pt called stating that she spoke with Amber & it could be a while before she can get an appt with Neurosurgery. She states that she really needs something until she gets an appointment. Please see prior note re: Medrol dose pack

## 2013-03-25 NOTE — Telephone Encounter (Signed)
Patient is under review with Dr. Cassandria Santee office. They will not provide apt until he says ok.

## 2013-03-25 NOTE — Telephone Encounter (Signed)
Can call in another medrol dose pack and then send to Amber to see if we can get her in asap since having increased pain.

## 2013-04-02 ENCOUNTER — Telehealth: Payer: Self-pay | Admitting: Internal Medicine

## 2013-04-02 NOTE — Telephone Encounter (Signed)
Pt called wanting to know why it took so long to get her referral Please call pt asap

## 2013-04-07 DIAGNOSIS — M542 Cervicalgia: Secondary | ICD-10-CM | POA: Diagnosis not present

## 2013-04-07 DIAGNOSIS — M4802 Spinal stenosis, cervical region: Secondary | ICD-10-CM | POA: Diagnosis not present

## 2013-04-08 ENCOUNTER — Encounter (HOSPITAL_COMMUNITY): Payer: Self-pay | Admitting: Pharmacy Technician

## 2013-04-08 ENCOUNTER — Encounter (HOSPITAL_COMMUNITY): Payer: Self-pay | Admitting: *Deleted

## 2013-04-08 ENCOUNTER — Other Ambulatory Visit: Payer: Self-pay | Admitting: Neurosurgery

## 2013-04-08 MED ORDER — CEFAZOLIN SODIUM-DEXTROSE 2-3 GM-% IV SOLR
2.0000 g | INTRAVENOUS | Status: AC
  Administered 2013-04-09: 2 g via INTRAVENOUS
  Filled 2013-04-08: qty 50

## 2013-04-09 ENCOUNTER — Inpatient Hospital Stay (HOSPITAL_COMMUNITY): Payer: Medicare Other

## 2013-04-09 ENCOUNTER — Encounter (HOSPITAL_COMMUNITY): Payer: Self-pay | Admitting: *Deleted

## 2013-04-09 ENCOUNTER — Inpatient Hospital Stay (HOSPITAL_COMMUNITY): Payer: Medicare Other | Admitting: Anesthesiology

## 2013-04-09 ENCOUNTER — Inpatient Hospital Stay (HOSPITAL_COMMUNITY)
Admission: RE | Admit: 2013-04-09 | Discharge: 2013-04-10 | DRG: 473 | Disposition: A | Discharge status: Home / Self Care | Payer: Medicare Other | Source: Ambulatory Visit | Attending: Neurosurgery | Admitting: Neurosurgery

## 2013-04-09 ENCOUNTER — Encounter (HOSPITAL_COMMUNITY): Payer: Self-pay | Admitting: Anesthesiology

## 2013-04-09 ENCOUNTER — Encounter (HOSPITAL_COMMUNITY)
Admission: RE | Disposition: A | Discharge status: Home / Self Care | Payer: Self-pay | Source: Ambulatory Visit | Attending: Neurosurgery

## 2013-04-09 DIAGNOSIS — M79609 Pain in unspecified limb: Secondary | ICD-10-CM | POA: Diagnosis not present

## 2013-04-09 DIAGNOSIS — Z8249 Family history of ischemic heart disease and other diseases of the circulatory system: Secondary | ICD-10-CM | POA: Diagnosis not present

## 2013-04-09 DIAGNOSIS — M4802 Spinal stenosis, cervical region: Secondary | ICD-10-CM | POA: Diagnosis not present

## 2013-04-09 DIAGNOSIS — Z01818 Encounter for other preprocedural examination: Secondary | ICD-10-CM | POA: Diagnosis not present

## 2013-04-09 DIAGNOSIS — Z8049 Family history of malignant neoplasm of other genital organs: Secondary | ICD-10-CM

## 2013-04-09 DIAGNOSIS — Z79899 Other long term (current) drug therapy: Secondary | ICD-10-CM | POA: Diagnosis not present

## 2013-04-09 DIAGNOSIS — M47812 Spondylosis without myelopathy or radiculopathy, cervical region: Principal | ICD-10-CM | POA: Diagnosis present

## 2013-04-09 DIAGNOSIS — Z8041 Family history of malignant neoplasm of ovary: Secondary | ICD-10-CM

## 2013-04-09 DIAGNOSIS — I10 Essential (primary) hypertension: Secondary | ICD-10-CM | POA: Diagnosis present

## 2013-04-09 DIAGNOSIS — Z800 Family history of malignant neoplasm of digestive organs: Secondary | ICD-10-CM | POA: Diagnosis not present

## 2013-04-09 DIAGNOSIS — E7800 Pure hypercholesterolemia, unspecified: Secondary | ICD-10-CM | POA: Diagnosis not present

## 2013-04-09 DIAGNOSIS — Z7982 Long term (current) use of aspirin: Secondary | ICD-10-CM

## 2013-04-09 DIAGNOSIS — M542 Cervicalgia: Secondary | ICD-10-CM | POA: Diagnosis not present

## 2013-04-09 DIAGNOSIS — M5382 Other specified dorsopathies, cervical region: Secondary | ICD-10-CM | POA: Diagnosis not present

## 2013-04-09 DIAGNOSIS — J40 Bronchitis, not specified as acute or chronic: Secondary | ICD-10-CM | POA: Diagnosis not present

## 2013-04-09 HISTORY — DX: Unspecified osteoarthritis, unspecified site: M19.90

## 2013-04-09 HISTORY — PX: ANTERIOR CERVICAL DECOMP/DISCECTOMY FUSION: SHX1161

## 2013-04-09 LAB — BASIC METABOLIC PANEL
BUN: 12 mg/dL (ref 6–23)
CO2: 27 mEq/L (ref 19–32)
Calcium: 9.3 mg/dL (ref 8.4–10.5)
Chloride: 105 mEq/L (ref 96–112)
Creatinine, Ser: 0.62 mg/dL (ref 0.50–1.10)
GFR calc Af Amer: >90 mL/min (ref >90)
GFR calc non Af Amer: >90 mL/min (ref >90)
Glucose, Bld: 98 mg/dL (ref 70–99)
Potassium: 4.6 mEq/L (ref 3.5–5.1)
Sodium: 140 mEq/L (ref 135–145)

## 2013-04-09 LAB — CBC
HCT: 37 % (ref 36.0–46.0)
Hemoglobin: 12.9 g/dL (ref 12.0–15.0)
MCH: 30.6 pg (ref 26.0–34.0)
MCHC: 34.9 g/dL (ref 30.0–36.0)
MCV: 87.9 fL (ref 78.0–100.0)
Platelets: 206 10*3/uL (ref 150–400)
RBC: 4.21 MIL/uL (ref 3.87–5.11)
RDW: 12.6 % (ref 11.5–15.5)
WBC: 5.2 10*3/uL (ref 4.0–10.5)

## 2013-04-09 LAB — SURGICAL PCR SCREEN
MRSA, PCR: NEGATIVE
Staphylococcus aureus: NEGATIVE

## 2013-04-09 LAB — GLUCOSE, CAPILLARY
Glucose-Capillary: 165 mg/dL — ABNORMAL HIGH (ref 70–99)
Glucose-Capillary: 173 mg/dL — ABNORMAL HIGH (ref 70–99)

## 2013-04-09 SURGERY — ANTERIOR CERVICAL DECOMPRESSION/DISCECTOMY FUSION 2 LEVELS
Anesthesia: General | Wound class: Clean

## 2013-04-09 MED ORDER — ESTRADIOL 0.1 MG/24HR TD PTWK
0.1000 mg | MEDICATED_PATCH | TRANSDERMAL | Status: DC
  Filled 2013-04-09: qty 1

## 2013-04-09 MED ORDER — EPHEDRINE SULFATE 50 MG/ML IJ SOLN
INTRAMUSCULAR | Status: DC | PRN
  Administered 2013-04-09: 5 mg via INTRAVENOUS

## 2013-04-09 MED ORDER — OXYCODONE-ACETAMINOPHEN 5-325 MG PO TABS
1.0000 | ORAL_TABLET | ORAL | Status: DC | PRN
  Administered 2013-04-09 – 2013-04-10 (×2): 2 via ORAL
  Filled 2013-04-09 (×2): qty 2

## 2013-04-09 MED ORDER — OXYCODONE HCL 5 MG/5ML PO SOLN: 5.0000 mg | Freq: Once | ORAL | Status: AC | PRN

## 2013-04-09 MED ORDER — DEXAMETHASONE 4 MG PO TABS
4.0000 mg | ORAL_TABLET | Freq: Four times a day (QID) | ORAL | Status: DC
  Administered 2013-04-09: 4 mg via ORAL
  Filled 2013-04-09 (×7): qty 1

## 2013-04-09 MED ORDER — MUPIROCIN 2 % EX OINT
TOPICAL_OINTMENT | CUTANEOUS | Status: AC
Start: 2013-04-09 — End: 2013-04-09
  Administered 2013-04-09: 1 via NASAL
  Filled 2013-04-09: qty 22

## 2013-04-09 MED ORDER — HYDROMORPHONE HCL PF 1 MG/ML IJ SOLN
INTRAMUSCULAR | Status: AC
  Filled 2013-04-09: qty 1

## 2013-04-09 MED ORDER — ACETAMINOPHEN 325 MG PO TABS: 650.0000 mg | ORAL_TABLET | ORAL | Status: DC | PRN

## 2013-04-09 MED ORDER — GLYCOPYRROLATE 0.2 MG/ML IJ SOLN
INTRAMUSCULAR | Status: DC | PRN
  Administered 2013-04-09: 0.4 mg via INTRAVENOUS
  Administered 2013-04-09: 0.2 mg via INTRAVENOUS

## 2013-04-09 MED ORDER — ALBUTEROL SULFATE HFA 108 (90 BASE) MCG/ACT IN AERS
INHALATION_SPRAY | RESPIRATORY_TRACT | Status: DC | PRN
  Administered 2013-04-09: 4 via RESPIRATORY_TRACT

## 2013-04-09 MED ORDER — LIDOCAINE HCL 4 % MT SOLN
OROMUCOSAL | Status: DC | PRN
  Administered 2013-04-09: 4 mL via TOPICAL

## 2013-04-09 MED ORDER — DIAZEPAM 5 MG PO TABS
ORAL_TABLET | ORAL | Status: AC
  Filled 2013-04-09: qty 1

## 2013-04-09 MED ORDER — NEOSTIGMINE METHYLSULFATE 1 MG/ML IJ SOLN
INTRAMUSCULAR | Status: DC | PRN
  Administered 2013-04-09: 3 mg via INTRAVENOUS

## 2013-04-09 MED ORDER — HYDROMORPHONE HCL PF 1 MG/ML IJ SOLN
0.2500 mg | INTRAMUSCULAR | Status: DC | PRN
  Administered 2013-04-09 (×2): 0.5 mg via INTRAVENOUS

## 2013-04-09 MED ORDER — PROMETHAZINE HCL 25 MG/ML IJ SOLN: 6.2500 mg | INTRAMUSCULAR | Status: DC | PRN

## 2013-04-09 MED ORDER — 0.9 % SODIUM CHLORIDE (POUR BTL) OPTIME
TOPICAL | Status: DC | PRN
  Administered 2013-04-09: 1000 mL

## 2013-04-09 MED ORDER — MENTHOL 3 MG MT LOZG
1.0000 | LOZENGE | OROMUCOSAL | Status: DC | PRN
  Filled 2013-04-09: qty 9

## 2013-04-09 MED ORDER — MORPHINE SULFATE 2 MG/ML IJ SOLN: 1.0000 mg | INTRAMUSCULAR | Status: DC | PRN

## 2013-04-09 MED ORDER — SODIUM CHLORIDE 0.9 % IJ SOLN
3.0000 mL | Freq: Two times a day (BID) | INTRAMUSCULAR | Status: DC
  Administered 2013-04-09: 3 mL via INTRAVENOUS

## 2013-04-09 MED ORDER — OXYCODONE HCL 5 MG PO TABS
ORAL_TABLET | ORAL | Status: AC
  Filled 2013-04-09: qty 1

## 2013-04-09 MED ORDER — PROPOFOL 10 MG/ML IV BOLUS
INTRAVENOUS | Status: DC | PRN
  Administered 2013-04-09: 180 mg via INTRAVENOUS

## 2013-04-09 MED ORDER — HEMOSTATIC AGENTS (NO CHARGE) OPTIME
TOPICAL | Status: DC | PRN
  Administered 2013-04-09: 1 via TOPICAL

## 2013-04-09 MED ORDER — MUPIROCIN 2 % EX OINT
TOPICAL_OINTMENT | Freq: Two times a day (BID) | CUTANEOUS | Status: DC
  Administered 2013-04-09: 1 via NASAL
  Filled 2013-04-09: qty 22

## 2013-04-09 MED ORDER — SODIUM CHLORIDE 0.9 % IV SOLN: 250.0000 mL | INTRAVENOUS | Status: DC

## 2013-04-09 MED ORDER — OXYCODONE HCL 5 MG PO TABS
5.0000 mg | ORAL_TABLET | Freq: Once | ORAL | Status: AC | PRN
  Administered 2013-04-09: 5 mg via ORAL

## 2013-04-09 MED ORDER — ZOLPIDEM TARTRATE 5 MG PO TABS
5.0000 mg | ORAL_TABLET | Freq: Every evening | ORAL | Status: DC | PRN
  Administered 2013-04-09: 5 mg via ORAL
  Filled 2013-04-09: qty 1

## 2013-04-09 MED ORDER — FENTANYL CITRATE 0.05 MG/ML IJ SOLN
INTRAMUSCULAR | Status: DC | PRN
  Administered 2013-04-09 (×3): 50 ug via INTRAVENOUS

## 2013-04-09 MED ORDER — SODIUM CHLORIDE 0.9 % IJ SOLN: 3.0000 mL | INTRAMUSCULAR | Status: DC | PRN

## 2013-04-09 MED ORDER — HYDROCHLOROTHIAZIDE 25 MG PO TABS
12.5000 mg | ORAL_TABLET | Freq: Every day | ORAL | Status: DC
  Administered 2013-04-09: 12.5 mg via ORAL
  Filled 2013-04-09 (×2): qty 0.5

## 2013-04-09 MED ORDER — MIDAZOLAM HCL 2 MG/2ML IJ SOLN: 1.0000 mg | INTRAMUSCULAR | Status: DC | PRN

## 2013-04-09 MED ORDER — LIDOCAINE HCL (CARDIAC) 20 MG/ML IV SOLN
INTRAVENOUS | Status: DC | PRN
  Administered 2013-04-09: 100 mg via INTRAVENOUS

## 2013-04-09 MED ORDER — THROMBIN 5000 UNITS EX SOLR
CUTANEOUS | Status: DC | PRN
  Administered 2013-04-09 (×3): 5000 [IU] via TOPICAL

## 2013-04-09 MED ORDER — MIDAZOLAM HCL 5 MG/5ML IJ SOLN
INTRAMUSCULAR | Status: DC | PRN
  Administered 2013-04-09: 2 mg via INTRAVENOUS

## 2013-04-09 MED ORDER — DIAZEPAM 5 MG PO TABS
5.0000 mg | ORAL_TABLET | Freq: Four times a day (QID) | ORAL | Status: DC | PRN
  Administered 2013-04-09 – 2013-04-10 (×2): 5 mg via ORAL
  Filled 2013-04-09: qty 1

## 2013-04-09 MED ORDER — DEXAMETHASONE SODIUM PHOSPHATE 4 MG/ML IJ SOLN
INTRAMUSCULAR | Status: DC | PRN
  Administered 2013-04-09: 8 mg via INTRAVENOUS

## 2013-04-09 MED ORDER — ONDANSETRON HCL 4 MG/2ML IJ SOLN
INTRAMUSCULAR | Status: DC | PRN
  Administered 2013-04-09: 4 mg via INTRAVENOUS

## 2013-04-09 MED ORDER — LACTATED RINGERS IV SOLN
INTRAVENOUS | Status: DC | PRN
  Administered 2013-04-09 (×2): via INTRAVENOUS

## 2013-04-09 MED ORDER — ONDANSETRON HCL 4 MG/2ML IJ SOLN: 4.0000 mg | INTRAMUSCULAR | Status: DC | PRN

## 2013-04-09 MED ORDER — FENTANYL CITRATE 0.05 MG/ML IJ SOLN: 50.0000 ug | Freq: Once | INTRAMUSCULAR | Status: DC

## 2013-04-09 MED ORDER — DEXAMETHASONE SODIUM PHOSPHATE 4 MG/ML IJ SOLN
4.0000 mg | Freq: Four times a day (QID) | INTRAMUSCULAR | Status: DC
  Administered 2013-04-09: 4 mg via INTRAVENOUS
  Filled 2013-04-09 (×5): qty 1

## 2013-04-09 MED ORDER — SODIUM CHLORIDE 0.9 % IV SOLN: INTRAVENOUS | Status: DC

## 2013-04-09 MED ORDER — CEFAZOLIN SODIUM 1-5 GM-% IV SOLN
1.0000 g | Freq: Three times a day (TID) | INTRAVENOUS | Status: AC
  Administered 2013-04-09: 1 g via INTRAVENOUS
  Filled 2013-04-09 (×2): qty 50

## 2013-04-09 MED ORDER — CEFAZOLIN SODIUM-DEXTROSE 2-3 GM-% IV SOLR
INTRAVENOUS | Status: AC
Start: 2013-04-09 — End: 2013-04-09
  Filled 2013-04-09: qty 50

## 2013-04-09 MED ORDER — ACETAMINOPHEN 650 MG RE SUPP: 650.0000 mg | RECTAL | Status: DC | PRN

## 2013-04-09 MED ORDER — ROCURONIUM BROMIDE 100 MG/10ML IV SOLN
INTRAVENOUS | Status: DC | PRN
  Administered 2013-04-09: 10 mg via INTRAVENOUS
  Administered 2013-04-09: 50 mg via INTRAVENOUS

## 2013-04-09 MED ORDER — PHENOL 1.4 % MT LIQD: 1.0000 | OROMUCOSAL | Status: DC | PRN

## 2013-04-09 SURGICAL SUPPLY — 51 items
BANDAGE GAUZE ELAST BULKY 4 IN (GAUZE/BANDAGES/DRESSINGS) ×4 IMPLANT
BENZOIN TINCTURE PRP APPL 2/3 (GAUZE/BANDAGES/DRESSINGS) ×2 IMPLANT
BIT DRILL SM SPINE QC 12 (BIT) ×2 IMPLANT
BLADE ULTRA TIP 2M (BLADE) ×2 IMPLANT
BUR BARREL STRAIGHT FLUTE 4.0 (BURR) IMPLANT
BUR MATCHSTICK NEURO 3.0 LAGG (BURR) ×2 IMPLANT
CANISTER SUCTION 2500CC (MISCELLANEOUS) ×2 IMPLANT
CLOTH BEACON ORANGE TIMEOUT ST (SAFETY) ×2 IMPLANT
CONT SPEC 4OZ CLIKSEAL STRL BL (MISCELLANEOUS) ×2 IMPLANT
COVER MAYO STAND STRL (DRAPES) ×2 IMPLANT
DRAPE C-ARM 42X72 X-RAY (DRAPES) ×4 IMPLANT
DRAPE LAPAROTOMY 100X72 PEDS (DRAPES) ×2 IMPLANT
DRAPE MICROSCOPE LEICA (MISCELLANEOUS) ×2 IMPLANT
DRAPE POUCH INSTRU U-SHP 10X18 (DRAPES) ×2 IMPLANT
DURAPREP 6ML APPLICATOR 50/CS (WOUND CARE) ×2 IMPLANT
ELECT REM PT RETURN 9FT ADLT (ELECTROSURGICAL) ×2
ELECTRODE REM PT RTRN 9FT ADLT (ELECTROSURGICAL) ×1 IMPLANT
GAUZE SPONGE 4X4 16PLY XRAY LF (GAUZE/BANDAGES/DRESSINGS) IMPLANT
GLOVE BIOGEL M 8.0 STRL (GLOVE) ×2 IMPLANT
GLOVE BIOGEL PI IND STRL 7.0 (GLOVE) ×2 IMPLANT
GLOVE BIOGEL PI INDICATOR 7.0 (GLOVE) ×2
GLOVE EXAM NITRILE LRG STRL (GLOVE) IMPLANT
GLOVE EXAM NITRILE MD LF STRL (GLOVE) ×2 IMPLANT
GLOVE EXAM NITRILE XL STR (GLOVE) IMPLANT
GLOVE EXAM NITRILE XS STR PU (GLOVE) IMPLANT
GLOVE INDICATOR 7.0 STRL GRN (GLOVE) ×2 IMPLANT
GOWN BRE IMP SLV AUR LG STRL (GOWN DISPOSABLE) ×2 IMPLANT
GOWN BRE IMP SLV AUR XL STRL (GOWN DISPOSABLE) ×4 IMPLANT
GOWN STRL REIN 2XL LVL4 (GOWN DISPOSABLE) IMPLANT
HEAD HALTER (SOFTGOODS) ×2 IMPLANT
HEMOSTAT POWDER KIT SURGIFOAM (HEMOSTASIS) ×2 IMPLANT
KIT BASIN OR (CUSTOM PROCEDURE TRAY) ×2 IMPLANT
KIT ROOM TURNOVER OR (KITS) ×2 IMPLANT
NEEDLE SPNL 22GX3.5 QUINCKE BK (NEEDLE) ×2 IMPLANT
NS IRRIG 1000ML POUR BTL (IV SOLUTION) ×2 IMPLANT
PACK LAMINECTOMY NEURO (CUSTOM PROCEDURE TRAY) ×2 IMPLANT
PATTIES SURGICAL .5 X1 (DISPOSABLE) ×2 IMPLANT
PLATE ANT CERV XTEND 2 LV 30 (Plate) ×2 IMPLANT
PUTTY BONE GRAFT KIT 2.5ML (Bone Implant) ×2 IMPLANT
RUBBERBAND STERILE (MISCELLANEOUS) ×4 IMPLANT
SCREW XTD VAR 4.2 SELF TAP 12 (Screw) ×12 IMPLANT
SPACER ACDF SM LORDOTIC 7 (Spacer) ×4 IMPLANT
SPONGE GAUZE 4X4 12PLY (GAUZE/BANDAGES/DRESSINGS) ×2 IMPLANT
SPONGE INTESTINAL PEANUT (DISPOSABLE) ×2 IMPLANT
SPONGE SURGIFOAM ABS GEL SZ50 (HEMOSTASIS) ×2 IMPLANT
STRIP CLOSURE SKIN 1/2X4 (GAUZE/BANDAGES/DRESSINGS) ×2 IMPLANT
SUT VIC AB 3-0 SH 8-18 (SUTURE) ×2 IMPLANT
SYR 20ML ECCENTRIC (SYRINGE) ×2 IMPLANT
TOWEL OR 17X24 6PK STRL BLUE (TOWEL DISPOSABLE) ×2 IMPLANT
TOWEL OR 17X26 10 PK STRL BLUE (TOWEL DISPOSABLE) ×2 IMPLANT
WATER STERILE IRR 1000ML POUR (IV SOLUTION) ×2 IMPLANT

## 2013-04-09 NOTE — Progress Notes (Signed)
ASSESSMENT DONE 1200 BY DORY DAY RN

## 2013-04-09 NOTE — Progress Notes (Signed)
Op note [phone]

## 2013-04-09 NOTE — Op Note (Signed)
NAMELAKEITA, PANTHER               ACCOUNT NO.:  [id number]  MEDICAL RECORD NO.:  [id number]  LOCATION:  [id number]                        FACILITY:  [facility]  PHYSICIAN:  Hilda Lias, M.D.   DATE OF BIRTH:  [birth year]  DATE OF PROCEDURE:  04/09/2013 DATE OF DISCHARGE:                              OPERATIVE REPORT   PREOPERATIVE DIAGNOSIS:  Cervical stenosis with spondylosis and chronic radiculopathy at the level cervical C5-C6 and C6-C7.  POSTOPERATIVE DIAGNOSIS:  Cervical stenosis with spondylosis and chronic radiculopathy at the level cervical C5-C6 and C6-C7.  PROCEDURE:  Anterior cervical C5-C6, C6-C7 diskectomy, decompression of the spinal cord, foraminotomy, interbody fusion with cage, plate, microscope.  SURGEON:  Hilda Lias, M.D.  ASSISTANT:  Marikay Alar, MD  CLINICAL HISTORY:  Ms. Friedli is a lady who was in my office complaining of neck pain with radiation to the right upper extremity with a burning sensation and weakness.  She has failed conservative treatment.  The x- rays show stenosis and spondylosis at C5-C6, C6-C7.  Surgery was advised.  The risks were explained to her and her family.  PROCEDURE IN DETAIL:  The patient was taken to the OR, and after intubation, the left side of the neck was cleaned with DuraPrep. Transverse incision was done through the skin, subcu tissue, and a straight to the cervical spine.  X-rays showed that we were at C5-C6. The space between C5-C6 was quite narrow, and we had to drill our way all the way posterior.  We brought the microscope into the area.  The posterior ligament was opened and using the 1 and 2 mm Kerrison punch, we decompressed the foramen bilateral as well the canal.  Then, the space was found for the spinal cord as well as the C6 nerve root.  The same procedure with the same narrowing between C6-C7 was found at the level C6-C7.  Decompression of the spinal cord as well as the C7 nerve root was done.  Then, the  endplates were drilled and 2 cages of 7 mm height, lordotic with autograft bone extender were inserted at those 2 levels.  This was followed with a plate using 6 screws.  Lateral cervical spine x-rays showed good position of the plate and the cage. The area was irrigated.  I waited 10 minute to be sure that we had good hemostasis.  Once this was achieved, the wound was closed with Vicryl and Steri-Strips.         ______________________________ Hilda Lias, M.D.    EB/MEDQ  D:  04/09/2013  T:  04/09/2013  Job:  [id number]

## 2013-04-09 NOTE — H&P (Signed)
Kristen Strickland is an 67 y.o. female.   Chief Complaint: right arm pain HPI: patient who came to my office with her husband and daughter complaining of neck pain with burning sensation radiating to the right arm for at least 6 months and lately to the left. She has had 2 rounds of prednisone with no improvement. Can not sleep  Past Medical History  Diagnosis Date  . Hypercholesterolemia   . Hypertension   . Arthritis     Past Surgical History  Procedure Laterality Date  . Abdominal hysterectomy  1975  . Back surgery      ruptured disc  . Reconstruction of nose      Family History  Problem Relation Age of Onset  . Cancer Mother     ovarian/uterine  . Heart disease Mother   . Colon cancer Father    Social History:  reports that she has never smoked. She has never used smokeless tobacco. She reports that  drinks alcohol. She reports that she does not use illicit drugs.  Allergies: No Known Allergies  No prescriptions prior to admission    No results found for this or any previous visit (from the past 48 hour(s)). No results found.  Review of Systems  Constitutional: Negative.   HENT: Positive for neck pain.   Eyes: Negative.   Respiratory: Negative.   Cardiovascular: Negative.   Gastrointestinal: Negative.   Skin: Negative.   Neurological: Positive for focal weakness.  Psychiatric/Behavioral: Negative.     There were no vitals taken for this visit. Physical Exam hent, nl. Neck, pain with lateralization. Cv, nl. Lungs, clear. Abdomen, soft. Extremities, nl. NEURO weakness of both biceps and right wrist extensor. Burning paing along the c6 dermatome. Dtr, decrease on both biceps. Mri shows spondylosis with foraminal narrow at cervical 5-6 and 6-7  Assessment/Plan Patient to go ahead with decompression and fusion at c56 and 67. sshe and her family are aware of risks and benefits  Orton Capell M 04/09/2013, 7:56 AM

## 2013-04-09 NOTE — Progress Notes (Signed)
UR COMPLETED  

## 2013-04-09 NOTE — Transfer of Care (Signed)
Immediate Anesthesia Transfer of Care Note  Patient: Kristen Strickland  Procedure(s) Performed: Procedure(s) with comments: ANTERIOR CERVICAL DECOMPRESSION/DISCECTOMY FUSION 2 LEVELS (N/A) - Cervical five-six Cervical six-seven  Anterior cervical decompression/diskectomy/fusion  Patient Location: PACU  Anesthesia Type:General  Level of Consciousness: awake, alert , oriented and patient cooperative  Airway & Oxygen Therapy: Patient Spontanous Breathing and Patient connected to nasal cannula oxygen  Post-op Assessment: Report given to PACU RN and Post -op Vital signs reviewed and stable  Post vital signs: Reviewed and stable  Complications: No apparent anesthesia complications

## 2013-04-09 NOTE — Preoperative (Signed)
Beta Blockers   Reason not to administer Beta Blockers:Not Applicable 

## 2013-04-09 NOTE — Anesthesia Preprocedure Evaluation (Addendum)
Anesthesia Evaluation  Patient identified by MRN, date of birth, ID band Patient awake    Reviewed: Allergy & Precautions, H&P , NPO status , Patient's Chart, lab work & pertinent test results  History of Anesthesia Complications Negative for: history of anesthetic complications  Airway Mallampati: II TM Distance: <3 FB Neck ROM: Limited    Dental  (+) Teeth Intact and Dental Advisory Given   Pulmonary neg pulmonary ROS,  breath sounds clear to auscultation        Cardiovascular hypertension, Pt. on medications Rhythm:Regular Rate:Normal     Neuro/Psych negative psych ROS   GI/Hepatic negative GI ROS, Neg liver ROS,   Endo/Other  negative endocrine ROS  Renal/GU negative Renal ROS     Musculoskeletal   Abdominal   Peds  Hematology negative hematology ROS (+)   Anesthesia Other Findings   Reproductive/Obstetrics negative OB ROS                          Anesthesia Physical Anesthesia Plan  ASA: II  Anesthesia Plan: General   Post-op Pain Management:    Induction: Intravenous  Airway Management Planned: Oral ETT  Additional Equipment:   Intra-op Plan:   Post-operative Plan: Extubation in OR  Informed Consent: I have reviewed the patients History and Physical, chart, labs and discussed the procedure including the risks, benefits and alternatives for the proposed anesthesia with the patient or authorized representative who has indicated his/her understanding and acceptance.     Plan Discussed with: CRNA and Surgeon  Anesthesia Plan Comments:         Anesthesia Quick Evaluation

## 2013-04-10 LAB — GLUCOSE, CAPILLARY: Glucose-Capillary: 127 mg/dL — ABNORMAL HIGH (ref 70–99)

## 2013-04-10 MED ORDER — HYDROCHLOROTHIAZIDE 12.5 MG PO CAPS
12.5000 mg | ORAL_CAPSULE | Freq: Every day | ORAL | Status: DC
  Filled 2013-04-10: qty 1

## 2013-04-10 NOTE — Progress Notes (Signed)
Pt d/c home with husband. D/c instructions and medications reviewed with Pt. Pt states understanding. All Pt questions answered

## 2013-04-10 NOTE — Discharge Instructions (Signed)
No lifting no bending no twisting no driving a riding a car unless she's come back and forth to see Dr. Jeral Fruit. Keep the incision clean and dry remove the outer dressing tomorrow leaving the Steri-Strips on and intact.

## 2013-04-10 NOTE — Discharge Summary (Signed)
  Physician Discharge Summary  Patient ID: Kristen Strickland MRN: [id number] DOB/AGE: 05/10/1946 67 y.o.  Admit date: 04/09/2013 Discharge date: 04/10/2013  Admission Diagnoses:cervical spondylosis with stenosis  Discharge Diagnoses: same Active Problems:   * No active hospital problems. *   Discharged Condition: good  Hospital Course: patient was admitted to the hospital underwent an ACDF and did very well postoperatively patient went to recovery room then the floor on the floor patient was convalescing well and living and voiding spontaneously tolerating a regular diet and was stable to be discharged home. She'll be discharged scheduled followup in Dr. Jeral Fruit and approximately 1-2 weeks  Consults: Significant Diagnostic Studies: Treatments:ACDF Discharge Exam: Blood pressure 129/54, pulse 72, temperature 98.1 F (36.7 C), temperature source Oral, resp. rate 18, height 5\' 4"  (1.626 m), weight 74.1 kg (163 lb 5.8 oz), SpO2 94.00%. Strength out of 5 wound clean and dry  Disposition: home   Future Appointments Provider Department Dept Phone   08/16/2013 8:15 AM [facility] [place] [phone]   08/19/2013 8:30 AM Charm Barges, MD [facility] [place] [phone]       Medication List    ASK your doctor about these medications       aspirin EC 81 MG tablet  Take 81 mg by mouth daily.     CENTRUM PO  Take 1 tablet by mouth daily.     estradiol 0.1 mg/24hr  Commonly known as:  CLIMARA - Dosed in mg/24 hr  Place 1 patch (0.1 mg total) onto the skin once a week.     hydrochlorothiazide 25 MG tablet  Commonly known as:  HYDRODIURIL  Take 12.5 mg by mouth daily.     ibuprofen 200 MG tablet  Commonly known as:  ADVIL,MOTRIN  Take 200 mg by mouth every 8 (eight) hours as needed for pain, fever or headache.         Signed: Petros Ahart P 04/10/2013, 6:47 AM

## 2013-04-10 NOTE — Progress Notes (Signed)
Subjective: Patient reports he still better she is having no arm pain her swallowing is manageable she is ready for discharge  Objective: Vital signs in last 24 hours: Temp:  [97 F (36.1 C)-98.1 F (36.7 C)] 98.1 F (36.7 C) (06/21 0537) Pulse Rate:  [50-72] 72 (06/21 0537) Resp:  [11-25] 18 (06/21 0537) BP: (129-180)/(54-79) 129/54 mmHg (06/21 0537) SpO2:  [93 %-99 %] 94 % (06/21 0537) Weight:  [69.6 kg (153 lb 7 oz)-74.1 kg (163 lb 5.8 oz)] 74.1 kg (163 lb 5.8 oz) (06/20 1606)  Intake/Output from previous day: 06/20 0701 - 06/21 0700 In: 1803 [P.O.:500; I.V.:1303] Out: 465 [Urine:365; Blood:100] Intake/Output this shift: Total I/O In: 503 [P.O.:500; I.V.:3] Out: 365 [Urine:365]  strength 5 out of 5 wound clean and dry  Lab Results:  Recent Labs  04/09/13 0851  WBC 5.2  HGB 12.9  HCT 37.0  PLT 206   BMET  Recent Labs  04/09/13 0851  NA 140  K 4.6  CL 105  CO2 27  GLUCOSE 98  BUN 12  CREATININE 0.62  CALCIUM 9.3    Studies/Results: Dg Chest 2 View  04/09/2013   *RADIOLOGY REPORT*  Clinical Data: Preoperative examination (cervical fusion)  CHEST - 2 VIEW  Comparison: None.  Findings:  Normal cardiac silhouette and mediastinal contours.  There is mild diffuse thickening of the pulmonary interstitium.  There is a minimal amount of pleural parenchymal thickening along the right minor fissure.  There is mild elevation of the right hemidiaphragm. No focal airspace opacity.  No pleural effusion or pneumothorax. No definite evidence of edema.  Unchanged bones.  IMPRESSION: Mild bronchitic change without acute cardiopulmonary disease.   Original Report Authenticated By: Tacey Ruiz, MD   Dg Cervical Spine 2-3 Views  04/09/2013   *RADIOLOGY REPORT*  Clinical Data: ACDF C5-C6 and C6-C7  CERVICAL SPINE - 2-3 VIEW  Comparison: Preoperative MRI of the cervical spine 03/18/2013  Findings: Two intraoperative cross-table lateral radiographs are submitted for interpretation.   The first image demonstrates a metallic marker positioned at the C5-C6 interspace.  The second image demonstrates interval surgical changes of the C5-C7 anterior cervical discectomy and fusion with interbody spacers at C5-C6 and C6-C7.  No evidence of immediate hardware complication.  Alignment remains anatomic.  The patient is intubated.  Serpentine linear radiopacity anterior to the spine likely reflects the radiopaque marker on a retained sponge.  IMPRESSION:  1.  Surgical changes of ACDF of C5-C7 as above.  2.  Serpentine linear radiopacity anterior to the spine likely reflects the radiopaque marker on a surgical sponge.   Original Report Authenticated By: Malachy Moan, M.D.    Assessment/Plan: Discharge home   LOS: 1 day     Kristen Strickland P 04/10/2013, 6:46 AM

## 2013-04-10 NOTE — Progress Notes (Signed)
Physical Therapy Evaluation Patient Details Name: Kristen Strickland MRN: [id number] DOB: 1946/06/24 Today's Date: 04/10/2013 Time: 1191-4782 PT Time Calculation (min): 33 min  PT Assessment / Plan / Recommendation Clinical Impression  Pt is 67 yo efmale s/p ACDF C5-7 who is mobilizing safely and independently. Postural education given as well as activity modifications. Pt does not need any f/u therapy. PT signing off.    PT Assessment  Patent does not need any further PT services    Follow Up Recommendations  No PT follow up    Does the patient have the potential to tolerate intense rehabilitation      Barriers to Discharge        Equipment Recommendations  None recommended by PT    Recommendations for Other Services     Frequency      Precautions / Restrictions Precautions Precautions: Cervical Required Braces or Orthoses: Cervical Brace Cervical Brace: Soft collar Restrictions Weight Bearing Restrictions: No   Pertinent Vitals/Pain No pain currently      Mobility  Bed Mobility Bed Mobility: Rolling Left;Left Sidelying to Sit;Sit to Supine Rolling Left: 7: Independent Left Sidelying to Sit: 7: Independent Sit to Supine: 7: Independent Details for Bed Mobility Assistance: pt educated on safe mvmt patterns to decrease pressure on neck Transfers Transfers: Sit to Stand;Stand to Sit Sit to Stand: 7: Independent Stand to Sit: 7: Independent Ambulation/Gait Ambulation/Gait Assistance: 7: Independent Ambulation Distance (Feet): 400 Feet Assistive device: None Gait Pattern: Within Functional Limits Gait velocity: WFL Stairs: No Wheelchair Mobility Wheelchair Mobility: No    Exercises     PT Diagnosis:    PT Problem List:   PT Treatment Interventions:     PT Goals    Visit Information  Last PT Received On: 04/10/13 Assistance Needed: +1    Subjective Data  Subjective: pt has many questions about what she can and can't do Patient Stated Goal: return  home   Prior Functioning  Home Living Lives With: Spouse Available Help at Discharge: Available PRN/intermittently;Family Type of Home: House Home Adaptive Equipment: None Prior Function Level of Independence: Independent Able to Take Stairs?: Yes Driving: Yes Vocation: Retired Musician: No difficulties    Copywriter, advertising Arousal/Alertness: Awake/alert Behavior During Therapy: WFL for tasks assessed/performed Overall Cognitive Status: Within Functional Limits for tasks assessed    Extremity/Trunk Assessment Right Upper Extremity Assessment RUE ROM/Strength/Tone: WFL for tasks assessed RUE Sensation: Deficits RUE Sensation Deficits: index finger still numb RUE Coordination: WFL - gross motor Left Upper Extremity Assessment LUE ROM/Strength/Tone: WFL for tasks assessed LUE Sensation: WFL - Light Touch;WFL - Proprioception LUE Coordination: WFL - gross motor Right Lower Extremity Assessment RLE ROM/Strength/Tone: Within functional levels RLE Sensation: WFL - Light Touch;WFL - Proprioception RLE Coordination: WFL - gross motor Left Lower Extremity Assessment LLE ROM/Strength/Tone: Within functional levels LLE Sensation: WFL - Light Touch;WFL - Proprioception LLE Coordination: WFL - gross motor Trunk Assessment Trunk Assessment: Normal   Balance Balance Balance Assessed: Yes Dynamic Standing Balance Dynamic Standing - Balance Support: No upper extremity supported;During functional activity Dynamic Standing - Level of Assistance: 7: Independent  End of Session PT - End of Session Equipment Utilized During Treatment: Cervical collar Activity Tolerance: Patient tolerated treatment well Patient left: in bed;with call bell/phone within reach;with family/visitor present Nurse Communication: Mobility status  GP   Lyanne Co, PT  Acute Rehab Services  [phone]   Lyanne Co 04/10/2013, 8:38 AM

## 2013-04-12 NOTE — Anesthesia Postprocedure Evaluation (Signed)
  Anesthesia Post-op Note  Patient: Kristen Strickland  Procedure(s) Performed: Procedure(s) with comments: ANTERIOR CERVICAL DECOMPRESSION/DISCECTOMY FUSION 2 LEVELS (N/A) - Cervical five-six Cervical six-seven  Anterior cervical decompression/diskectomy/fusion  Patient Location: PACU  Anesthesia Type:General  Level of Consciousness: awake and alert   Airway and Oxygen Therapy: Patient Spontanous Breathing  Post-op Pain: mild  Post-op Assessment: Post-op Vital signs reviewed, Patient's Cardiovascular Status Stable, Respiratory Function Stable, Patent Airway, No signs of Nausea or vomiting and Pain level controlled  Post-op Vital Signs: stable  Complications: No apparent anesthesia complications

## 2013-04-14 ENCOUNTER — Encounter (HOSPITAL_COMMUNITY): Payer: Self-pay | Admitting: Neurosurgery

## 2013-04-26 ENCOUNTER — Encounter: Payer: Self-pay | Admitting: Adult Health

## 2013-04-26 ENCOUNTER — Ambulatory Visit (INDEPENDENT_AMBULATORY_CARE_PROVIDER_SITE_OTHER): Payer: Medicare Other | Admitting: Adult Health

## 2013-04-26 VITALS — BP 158/70 | HR 73 | Temp 97.8°F | Resp 12 | Wt 154.0 lb

## 2013-04-26 DIAGNOSIS — T148 Other injury of unspecified body region: Secondary | ICD-10-CM | POA: Diagnosis not present

## 2013-04-26 DIAGNOSIS — M25511 Pain in right shoulder: Secondary | ICD-10-CM | POA: Insufficient documentation

## 2013-04-26 DIAGNOSIS — W57XXXA Bitten or stung by nonvenomous insect and other nonvenomous arthropods, initial encounter: Secondary | ICD-10-CM

## 2013-04-26 MED ORDER — PREDNISONE 10 MG PO TABS: ORAL_TABLET | ORAL | Status: DC

## 2013-04-26 MED ORDER — PREDNISONE (PAK) 10 MG PO TABS: 10.0000 mg | ORAL_TABLET | Freq: Every day | ORAL | Status: DC

## 2013-04-26 NOTE — Assessment & Plan Note (Signed)
Small papules on chest, abdomen, back, groin and legs. Appeared to be flea bites. Start prednisone taper - Take 60 mg (6 tablets) on the first day and then decrease by 10 mg (1 tablet) daily until gone. RTC if symptoms are not improved within 4-5 days or sooner if needed

## 2013-04-26 NOTE — Progress Notes (Signed)
  Subjective:    Patient ID: ARRYN TERRONES, female    DOB: 1946/09/06, 68 y.o.   MRN: [id number]  HPI  Patient is a 67 year old female who presents to clinic with complaints of rash and itching. She first noticed these symptoms on Saturday after she had been outdoors. She was also carrying a little dog that she noticed was scratching significantly. She has tried Benadryl topical spray with relief of the itching. Denies any new medications, soaps, lotions.   Current Outpatient Prescriptions on File Prior to Visit  Medication Sig Dispense Refill  . aspirin EC 81 MG tablet Take 81 mg by mouth daily.      Marland Kitchen estradiol (CLIMARA - DOSED IN MG/24 HR) 0.1 mg/24hr Place 1 patch (0.1 mg total) onto the skin once a week.  4 patch  5  . hydrochlorothiazide (HYDRODIURIL) 25 MG tablet Take 12.5 mg by mouth daily.      Marland Kitchen ibuprofen (ADVIL,MOTRIN) 200 MG tablet Take 200 mg by mouth every 8 (eight) hours as needed for pain, fever or headache.       . Multiple Vitamins-Minerals (CENTRUM PO) Take 1 tablet by mouth daily.        No current facility-administered medications on file prior to visit.     Review of Systems  Skin: Positive for rash on legs, groin, abdomen, back, chest. Negative for fever, chills    Objective:   Physical Exam  Skin: Papular rash noted on chest, back, abdomen, groin, legs. No evidence of infection.      Assessment & Plan:

## 2013-04-26 NOTE — Patient Instructions (Addendum)
  Start prednisone taper: Take 60 mg (6 tablets) on the first day and then decrease by 10 mg (1 tablet) daily until gone.  Continue to use the Benadryl topical spray for the itching.  You can also take one Benadryl at bedtime for itching.  Please let us know if symptoms are not improved within 4-5 days.

## 2013-05-03 DIAGNOSIS — M4802 Spinal stenosis, cervical region: Secondary | ICD-10-CM | POA: Diagnosis not present

## 2013-05-03 DIAGNOSIS — M542 Cervicalgia: Secondary | ICD-10-CM | POA: Diagnosis not present

## 2013-05-24 DIAGNOSIS — M4802 Spinal stenosis, cervical region: Secondary | ICD-10-CM | POA: Diagnosis not present

## 2013-05-24 DIAGNOSIS — Z6829 Body mass index (BMI) 29.0-29.9, adult: Secondary | ICD-10-CM | POA: Diagnosis not present

## 2013-06-09 ENCOUNTER — Other Ambulatory Visit: Payer: Self-pay | Admitting: Internal Medicine

## 2013-08-02 ENCOUNTER — Other Ambulatory Visit: Payer: Self-pay | Admitting: *Deleted

## 2013-08-02 MED ORDER — HYDROCHLOROTHIAZIDE 25 MG PO TABS: 12.5000 mg | ORAL_TABLET | Freq: Every day | ORAL | Status: DC

## 2013-08-16 ENCOUNTER — Other Ambulatory Visit (INDEPENDENT_AMBULATORY_CARE_PROVIDER_SITE_OTHER): Payer: Medicare Other

## 2013-08-16 DIAGNOSIS — E7800 Pure hypercholesterolemia, unspecified: Secondary | ICD-10-CM | POA: Diagnosis not present

## 2013-08-16 LAB — LIPID PANEL
Cholesterol: 222 mg/dL — ABNORMAL HIGH (ref 0–200)
HDL: 48.7 mg/dL (ref >39.00)
Total CHOL/HDL Ratio: 5
Triglycerides: 136 mg/dL (ref 0.0–149.0)
VLDL: 27.2 mg/dL (ref 0.0–40.0)

## 2013-08-16 LAB — LDL CHOLESTEROL, DIRECT: Direct LDL: 151.7 mg/dL

## 2013-08-19 ENCOUNTER — Encounter: Payer: Self-pay | Admitting: Internal Medicine

## 2013-08-19 ENCOUNTER — Ambulatory Visit (INDEPENDENT_AMBULATORY_CARE_PROVIDER_SITE_OTHER): Payer: Medicare Other | Admitting: Internal Medicine

## 2013-08-19 VITALS — BP 130/80 | HR 54 | Temp 97.8°F | Ht 64.0 in | Wt 154.5 lb

## 2013-08-19 DIAGNOSIS — E7800 Pure hypercholesterolemia, unspecified: Secondary | ICD-10-CM

## 2013-08-19 DIAGNOSIS — M25511 Pain in right shoulder: Secondary | ICD-10-CM

## 2013-08-19 DIAGNOSIS — I10 Essential (primary) hypertension: Secondary | ICD-10-CM | POA: Diagnosis not present

## 2013-08-19 DIAGNOSIS — M25519 Pain in unspecified shoulder: Secondary | ICD-10-CM

## 2013-08-19 MED ORDER — LISINOPRIL 10 MG PO TABS: 10.0000 mg | ORAL_TABLET | Freq: Every day | ORAL | Status: DC

## 2013-08-22 ENCOUNTER — Encounter: Payer: Self-pay | Admitting: Internal Medicine

## 2013-08-22 ENCOUNTER — Telehealth: Payer: Self-pay | Admitting: Internal Medicine

## 2013-08-22 NOTE — Assessment & Plan Note (Signed)
Neck pain better s/p anterior cervical decompression.  Some right shoulder pain.  Discussed further w/up.  She declines.  Wants to discuss with Dr Jeral Fruit.  Declines earlier appt.

## 2013-08-22 NOTE — Telephone Encounter (Signed)
Please schedule her for a non fasting lab appt in within the next 10-14 days.  This is because of the new medication she started.  Please contact her with an appt date and time.

## 2013-08-22 NOTE — Progress Notes (Signed)
Subjective:    Patient ID: Kristen Strickland, female    DOB: 1946-03-16, 68 y.o.   MRN: [id number]  HPI 67 year old female with past history of hypertension and hypercholesterolemia who comes in today to follow up on these issues as well as for a complete physical exam.   States she has been doing relatively well.  Stays active.  No cardiac symptoms with increased activity or exertion.  Breathing stable.  No acid reflux.  Bowels stable.  Discussed cholesterol.  Discussed diet and exercise.  She desires not to take medication.  S/p neck surgery.  Neck is better.  Still with some right shoulder pain.  Overall better.  She has f/u with her surgeon 09/22/13.  Desires no further intervention at this time.     Past Medical History  Diagnosis Date  . Hypercholesterolemia   . Hypertension   . Arthritis     Current Outpatient Prescriptions on File Prior to Visit  Medication Sig Dispense Refill  . aspirin EC 81 MG tablet Take 81 mg by mouth daily.      Marland Kitchen estradiol (CLIMARA - DOSED IN MG/24 HR) 0.1 mg/24hr patch APPLY 1 PATCH ON SKIN ONCE WEEKLY.  4 patch  4  . hydrochlorothiazide (HYDRODIURIL) 25 MG tablet Take 0.5 tablets (12.5 mg total) by mouth daily.  30 tablet  5  . ibuprofen (ADVIL,MOTRIN) 200 MG tablet Take 200 mg by mouth every 8 (eight) hours as needed for pain, fever or headache.       . Multiple Vitamins-Minerals (CENTRUM PO) Take 1 tablet by mouth daily.        No current facility-administered medications on file prior to visit.    Review of Systems occasional headache.  No severe headache.  No lightheadedness or dizziness.  No vision change.  No sinus symptoms.  No chest pain, tightness or palpitations.  No increased shortness of breath, cough or congestion.  No nausea or vomiting. No acid reflux.  No abdominal pain or cramping.  No bowel change, such as diarrhea, constipation, BRBPR or melana.  No urine change.  Overall she feels she is doing relatively well.  Neck is better since her  surgery.  Still with some right shoulder pain.  Desires not further intervention.  Some increased belching.       Objective:   Physical Exam  Filed Vitals:   08/19/13 0850  BP: 130/80  Pulse: 54  Temp: 97.8 F (36.6 C)   Blood pressure recheck:  57/4  67 year old female in no acute distress.   HEENT:  Nares- clear.  Oropharynx - without lesions. NECK:  Supple.  Nontender.  No audible bruit.  HEART:  Appears to be regular. LUNGS:  No crackles or wheezing audible.  Respirations even and unlabored.  RADIAL PULSE:  Equal bilaterally.    BREASTS:  No nipple discharge or nipple retraction present.  Could not appreciate any distinct nodules or axillary adenopathy.  ABDOMEN:  Soft, nontender.  Bowel sounds present and normal.  No audible abdominal bruit.  GU:  Not performed.    EXTREMITIES:  No increased edema present.  DP pulses palpable and equal bilaterally.          Assessment & Plan:  HEADACHE.  Describes occasional headaches.  Not severe.  Follow.    GYN.  Pelvic and pap 08/19/12.  Negative.     HEALTH MAINTENANCE.  Physical today.  Mammogram 12/01/12 - Birads II.  Pap negative.  She declines colonoscopy.  Again discussed  with her today.

## 2013-08-22 NOTE — Assessment & Plan Note (Signed)
History of elevated cholesterol.  Just checked - still elevated.  Low cholesterol diet and exercise.  Desires not to take a statin.  Follow lipid profile.

## 2013-08-22 NOTE — Assessment & Plan Note (Signed)
Blood pressure elevated on my check and has been elevated on previous visits.  She is not checking her pressure.  Will start lisinopril 10mg  q day.  Check metabolic panel in 10-14 days.  Follow pressures.  Get her back in soon to reassess.

## 2013-08-24 NOTE — Telephone Encounter (Signed)
Left message for pt to call office

## 2013-08-27 NOTE — Telephone Encounter (Signed)
Left message for pt to call office

## 2013-08-30 NOTE — Telephone Encounter (Signed)
Left message for pt to call office Made non fasting lab appointment for Monday 09/06/13 @ 2:15 Mailed appointment card to patient

## 2013-09-06 ENCOUNTER — Other Ambulatory Visit (INDEPENDENT_AMBULATORY_CARE_PROVIDER_SITE_OTHER): Payer: Medicare Other

## 2013-09-06 DIAGNOSIS — I10 Essential (primary) hypertension: Secondary | ICD-10-CM

## 2013-09-07 LAB — BASIC METABOLIC PANEL
BUN: 16 mg/dL (ref 6–23)
CO2: 28 mEq/L (ref 19–32)
Calcium: 9.2 mg/dL (ref 8.4–10.5)
Chloride: 103 mEq/L (ref 96–112)
Creatinine, Ser: 0.6 mg/dL (ref 0.4–1.2)
GFR: 102.24 mL/min (ref >60.00)
Glucose, Bld: 91 mg/dL (ref 70–99)
Potassium: 3.9 mEq/L (ref 3.5–5.1)
Sodium: 136 mEq/L (ref 135–145)

## 2013-09-08 ENCOUNTER — Encounter: Payer: Self-pay | Admitting: *Deleted

## 2013-09-20 ENCOUNTER — Other Ambulatory Visit: Payer: Self-pay | Admitting: Internal Medicine

## 2013-09-20 DIAGNOSIS — M545 Low back pain, unspecified: Secondary | ICD-10-CM | POA: Diagnosis not present

## 2013-09-20 DIAGNOSIS — E663 Overweight: Secondary | ICD-10-CM | POA: Diagnosis not present

## 2013-09-20 DIAGNOSIS — M4802 Spinal stenosis, cervical region: Secondary | ICD-10-CM | POA: Diagnosis not present

## 2013-09-24 ENCOUNTER — Encounter: Payer: Self-pay | Admitting: Internal Medicine

## 2013-09-24 ENCOUNTER — Encounter (INDEPENDENT_AMBULATORY_CARE_PROVIDER_SITE_OTHER): Payer: Self-pay

## 2013-09-24 ENCOUNTER — Ambulatory Visit (INDEPENDENT_AMBULATORY_CARE_PROVIDER_SITE_OTHER): Payer: Medicare Other | Admitting: Internal Medicine

## 2013-09-24 VITALS — BP 120/62 | HR 64 | Temp 98.0°F | Ht 64.0 in | Wt 154.5 lb

## 2013-09-24 DIAGNOSIS — M25519 Pain in unspecified shoulder: Secondary | ICD-10-CM

## 2013-09-24 DIAGNOSIS — I10 Essential (primary) hypertension: Secondary | ICD-10-CM

## 2013-09-24 DIAGNOSIS — M25511 Pain in right shoulder: Secondary | ICD-10-CM

## 2013-09-24 LAB — BASIC METABOLIC PANEL
BUN: 13 mg/dL (ref 6–23)
CO2: 29 mEq/L (ref 19–32)
Calcium: 9.7 mg/dL (ref 8.4–10.5)
Chloride: 102 mEq/L (ref 96–112)
Creatinine, Ser: 0.7 mg/dL (ref 0.4–1.2)
GFR: 87.43 mL/min (ref >60.00)
Glucose, Bld: 92 mg/dL (ref 70–99)
Potassium: 3.8 mEq/L (ref 3.5–5.1)
Sodium: 137 mEq/L (ref 135–145)

## 2013-09-24 MED ORDER — LISINOPRIL-HYDROCHLOROTHIAZIDE 10-12.5 MG PO TABS: 1.0000 | ORAL_TABLET | Freq: Every day | ORAL | Status: DC

## 2013-09-24 NOTE — Progress Notes (Signed)
  Subjective:    Patient ID: Kristen Strickland, female    DOB: May 21, 1946, 67 y.o.   MRN: [id number]  HPI 67 year old female with past history of hypertension and hypercholesterolemia who comes in today to for a scheduled follow up.  States she has been doing relatively well.  Stays active.  No cardiac symptoms with increased activity or exertion.  Breathing stable.  No acid reflux.  Bowels stable.  Discussed cholesterol.  Discussed diet and exercise.  She desires not to take medication.  S/p neck surgery.  Neck is better.  Still with some right shoulder pain.  Overall better.  Last visit, we started her on lisinopril 10mg  q day.  She is tolerating.  Blood pressure doing well.  She is taking 25mg  of hctz now q day.  Tolerating.  Feels good.      Past Medical History  Diagnosis Date  . Hypercholesterolemia   . Hypertension   . Arthritis     Current Outpatient Prescriptions on File Prior to Visit  Medication Sig Dispense Refill  . aspirin EC 81 MG tablet Take 81 mg by mouth daily.      Marland Kitchen estradiol (CLIMARA - DOSED IN MG/24 HR) 0.1 mg/24hr patch APPLY 1 PATCH ON SKIN ONCE WEEKLY.  4 patch  4  . hydrochlorothiazide (HYDRODIURIL) 25 MG tablet Take 0.5 tablets (12.5 mg total) by mouth daily.  30 tablet  5  . ibuprofen (ADVIL,MOTRIN) 200 MG tablet Take 200 mg by mouth every 8 (eight) hours as needed for pain, fever or headache.       . lisinopril (PRINIVIL,ZESTRIL) 10 MG tablet TAKE 1 TABLET BY MOUTH ONCE DAILY.  30 tablet  5  . Multiple Vitamins-Minerals (CENTRUM PO) Take 1 tablet by mouth daily.        No current facility-administered medications on file prior to visit.    Review of Systems no headache reported.  No lightheadedness or dizziness.  No vision change.  No sinus symptoms.  No chest pain, tightness or palpitations.  No increased shortness of breath, cough or congestion.  No nausea or vomiting. No acid reflux.  No abdominal pain or cramping.  No bowel change, such as diarrhea,  constipation, BRBPR or melana.  No urine change.  Overall she feels she is doing relatively well.  Neck is better since her surgery.  Still with some right shoulder pain.  Blood pressure better.  Tolerating lisinopril.       Objective:   Physical Exam  Filed Vitals:   09/24/13 1341  BP: 120/62  Pulse: 64  Temp: 98 F (36.7 C)   Blood pressure recheck:  21/73  67 year old female in no acute distress.   HEENT:  Nares- clear.  Oropharynx - without lesions. NECK:  Supple.  Nontender.  No audible bruit.  HEART:  Appears to be regular. LUNGS:  No crackles or wheezing audible.  Respirations even and unlabored.  RADIAL PULSE:  Equal bilaterally.    ABDOMEN:  Soft, nontender.  Bowel sounds present and normal.  No audible abdominal bruit.    EXTREMITIES:  No increased edema present.  DP pulses palpable and equal bilaterally.          Assessment & Plan:  HEADACHE.  No headaches reported today.    GYN.  Pelvic and pap 08/19/12.  Negative.     HEALTH MAINTENANCE.  Physical last visit.  Mammogram 12/01/12 - Birads II.  Pap negative.  She declines colonoscopy.

## 2013-09-24 NOTE — Progress Notes (Signed)
Pre-visit discussion using our clinic review tool. No additional management support is needed unless otherwise documented below in the visit note.  

## 2013-09-24 NOTE — Assessment & Plan Note (Addendum)
Blood pressure is better.  She wants to take one pill.  Will change her to lisinopril/hctz 10/12.5 q day.  Follow pressures.  Check metabolic panel.

## 2013-09-26 ENCOUNTER — Encounter: Payer: Self-pay | Admitting: Internal Medicine

## 2013-09-26 NOTE — Assessment & Plan Note (Addendum)
Neck pain better s/p anterior cervical decompression.  Some right shoulder pain.  Sees Dr Jeral Fruit.  Follow.  Doing well.

## 2013-09-27 ENCOUNTER — Encounter: Payer: Self-pay | Admitting: *Deleted

## 2013-09-27 ENCOUNTER — Ambulatory Visit: Payer: Medicare Other | Admitting: Internal Medicine

## 2013-09-27 IMAGING — MG MM DIGITAL SCREENING BILAT W/ CAD
1 series · 4 of 4 positions shown · non-contrast
Comparison: none

REASON FOR EXAM: SCR MAMMO NO ORDER
COMMENTS:

PROCEDURE:     MMM - MMM DGT SCR NO ORDER W/CAD  - December 01, 2012  [DATE]
RESULT:     Comparison made to multiple prior study dated 01/17/2003. No
mass. Benign calcifications. CAD evaluation nonfocal.

[R CC · right · 4 of 4 slices shown]
[im 1/4]
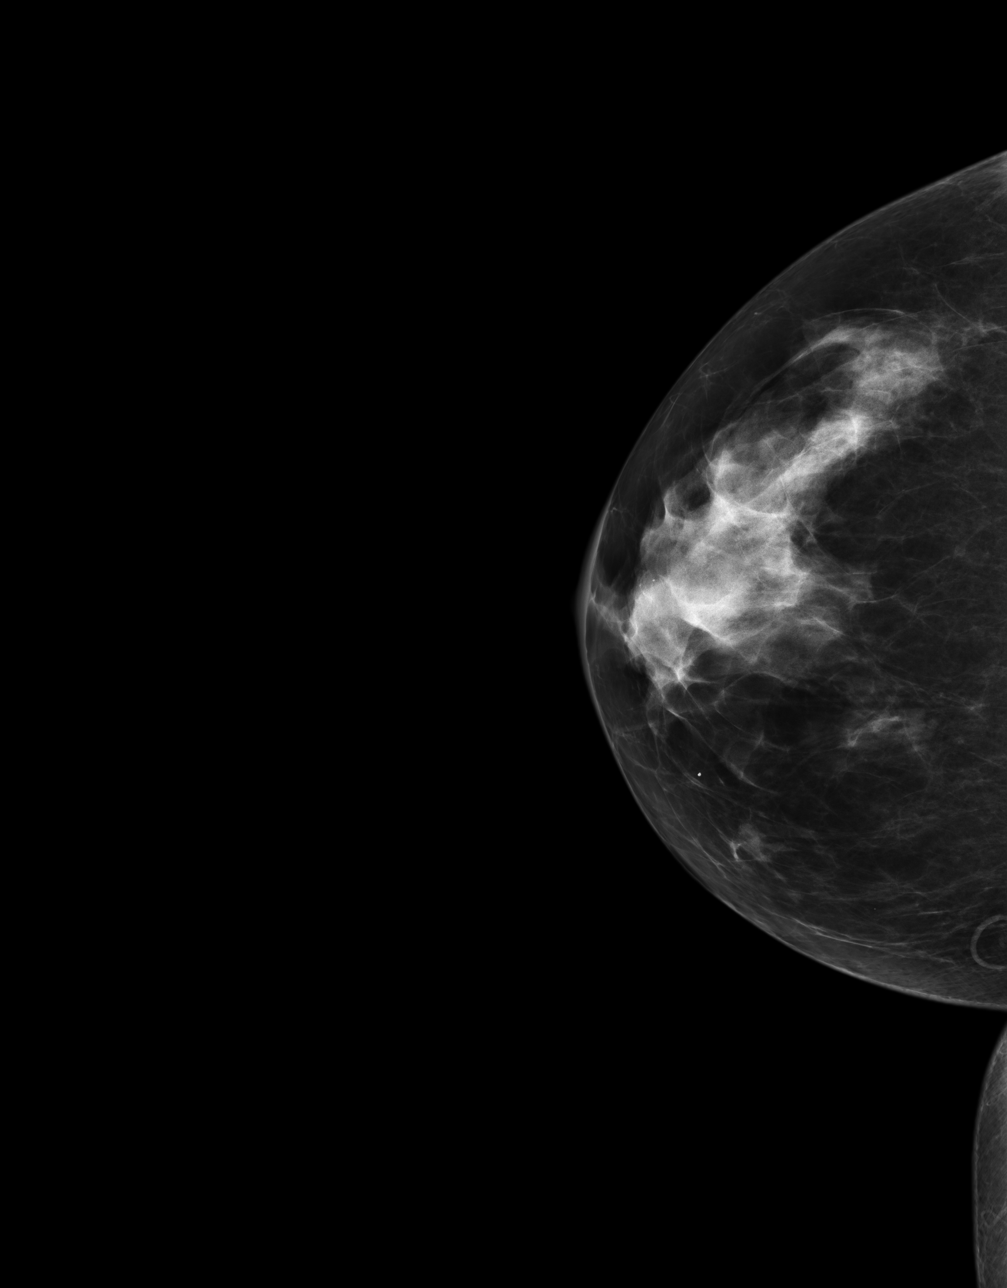
[im 2/4]
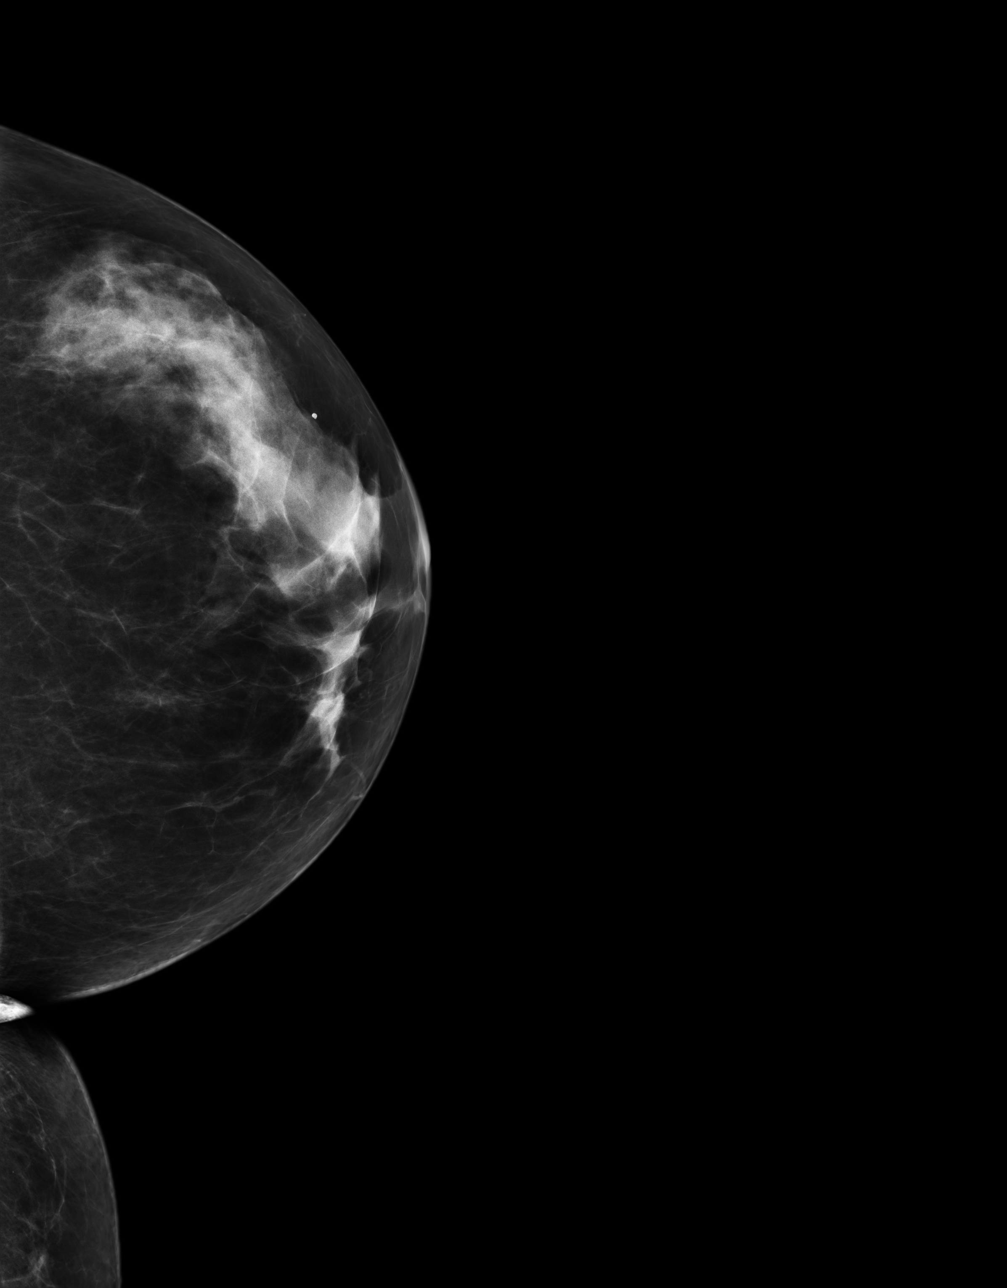
[im 3/4]
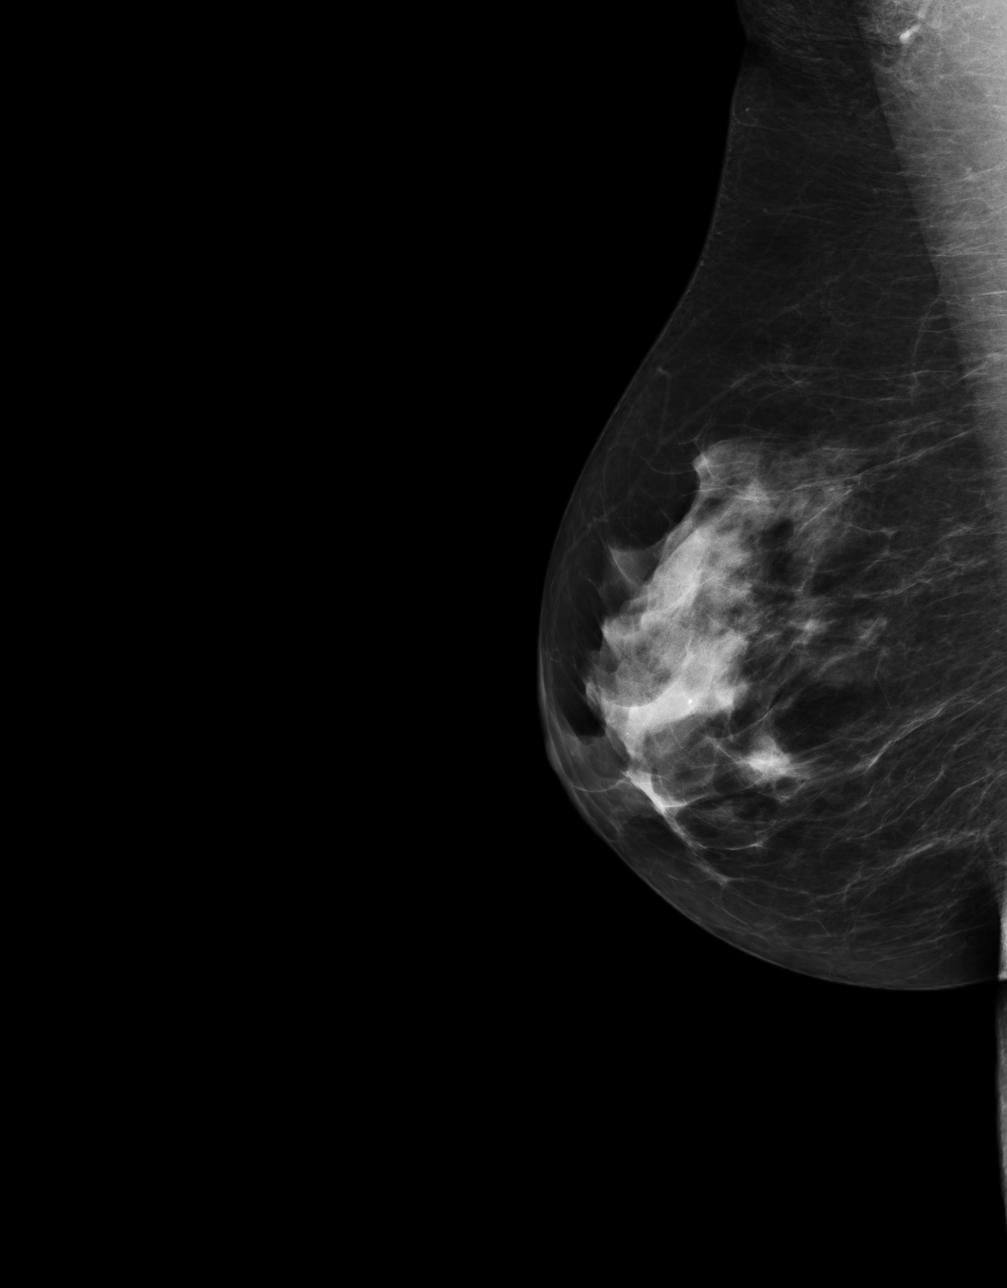
[im 4/4]
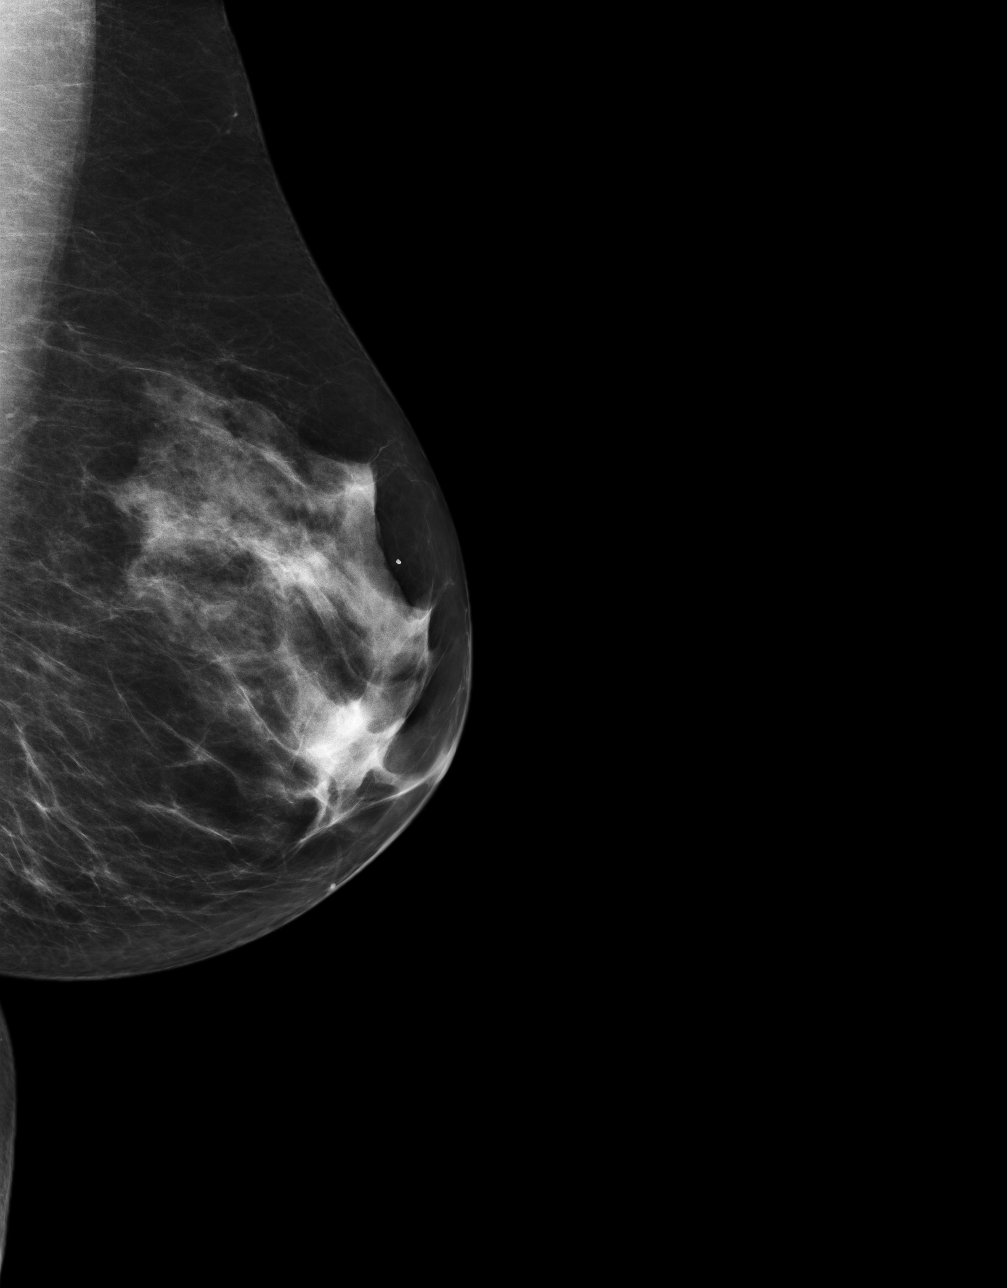

[4 of 4 positions shown; findings below may reference images not displayed]

IMPRESSION: Benign exam.

BI-RADS: Category 2- Benign Finding

A NEGATIVE MAMMOGRAM REPORT DOES NOT PRECLUDE BIOPSY OR OTHER EVALUATION OF
A CLINICALLY PALPABLE OR OTHERWISE SUSPICIOUS MASS OR LESION. BREAST CANCER
MAY NOT BE DETECTED IN UP TO 10% OF CASES.

Thank you for the oppurtunity to contribute to the care of your patient. .

BREAST COMPOSITION: The breast composition is SCATTERED FIBROGLANDULAR
TISSUE (glandular tissue is 25-50%)

## 2013-10-04 DIAGNOSIS — M545 Low back pain, unspecified: Secondary | ICD-10-CM | POA: Diagnosis not present

## 2013-11-08 DIAGNOSIS — I10 Essential (primary) hypertension: Secondary | ICD-10-CM | POA: Diagnosis not present

## 2013-11-08 DIAGNOSIS — M542 Cervicalgia: Secondary | ICD-10-CM | POA: Diagnosis not present

## 2013-11-08 DIAGNOSIS — E669 Obesity, unspecified: Secondary | ICD-10-CM | POA: Diagnosis not present

## 2013-11-30 ENCOUNTER — Other Ambulatory Visit: Payer: Self-pay | Admitting: Internal Medicine

## 2013-12-01 ENCOUNTER — Ambulatory Visit (INDEPENDENT_AMBULATORY_CARE_PROVIDER_SITE_OTHER): Payer: Medicare Other | Admitting: Adult Health

## 2013-12-01 ENCOUNTER — Encounter: Payer: Self-pay | Admitting: Adult Health

## 2013-12-01 VITALS — BP 140/68 | HR 59 | Temp 98.0°F | Resp 14 | Wt 157.0 lb

## 2013-12-01 DIAGNOSIS — J3489 Other specified disorders of nose and nasal sinuses: Secondary | ICD-10-CM | POA: Diagnosis not present

## 2013-12-01 DIAGNOSIS — R51 Headache: Secondary | ICD-10-CM

## 2013-12-01 DIAGNOSIS — R519 Headache, unspecified: Secondary | ICD-10-CM

## 2013-12-01 LAB — CBC WITH DIFFERENTIAL/PLATELET
Basophils Absolute: 0.1 10*3/uL (ref 0.0–0.1)
Basophils Relative: 1 % (ref 0–1)
Eosinophils Absolute: 0.1 10*3/uL (ref 0.0–0.7)
Eosinophils Relative: 2 % (ref 0–5)
HCT: 38.1 % (ref 36.0–46.0)
Hemoglobin: 12.9 g/dL (ref 12.0–15.0)
Lymphocytes Relative: 22 % (ref 12–46)
Lymphs Abs: 1.3 10*3/uL (ref 0.7–4.0)
MCH: 29.5 pg (ref 26.0–34.0)
MCHC: 33.9 g/dL (ref 30.0–36.0)
MCV: 87.2 fL (ref 78.0–100.0)
Monocytes Absolute: 0.5 10*3/uL (ref 0.1–1.0)
Monocytes Relative: 9 % (ref 3–12)
Neutro Abs: 3.9 10*3/uL (ref 1.7–7.7)
Neutrophils Relative %: 66 % (ref 43–77)
Platelets: 264 10*3/uL (ref 150–400)
RBC: 4.37 MIL/uL (ref 3.87–5.11)
RDW: 13.5 % (ref 11.5–15.5)
WBC: 5.9 10*3/uL (ref 4.0–10.5)

## 2013-12-01 NOTE — Progress Notes (Signed)
Pre visit review using our clinic review tool, if applicable. No additional management support is needed unless otherwise documented below in the visit note. 

## 2013-12-01 NOTE — Progress Notes (Signed)
Patient ID: Kristen Strickland, female   DOB: [birth year], 68 y.o.   MRN: [id number]    Subjective:    Patient ID: Kristen Strickland, female    DOB: 05/03/1946, 68 y.o.   MRN: [id number]  HPI  Pt is a pleasant 68 y/o female who presents with HA on the right side of her forehead and down the right side of her face. She reports having a viral infection that started January 9th but her symptoms improved. Her HA has been "off and on since then". She reports pain in the right temporal area as well as over her maxillary sinuses. She has a dull ache this morning but not sinus pain. Denies congestion, rhinorrhea or other symptoms. She reports feeling pressure within her ears while traveling in an airplane back on January 9th. None currently.   Past Medical History  Diagnosis Date  . Hypercholesterolemia   . Hypertension   . Arthritis     Current Outpatient Prescriptions on File Prior to Visit  Medication Sig Dispense Refill  . aspirin EC 81 MG tablet Take 81 mg by mouth daily.      Marland Kitchen estradiol (CLIMARA - DOSED IN MG/24 HR) 0.1 mg/24hr patch APPLY 1 PATCH ON SKIN ONCE WEEKLY.  4 patch  5  . ibuprofen (ADVIL,MOTRIN) 200 MG tablet Take 200 mg by mouth every 8 (eight) hours as needed for pain, fever or headache.       . lisinopril-hydrochlorothiazide (PRINZIDE,ZESTORETIC) 10-12.5 MG per tablet TAKE 1 TABLET BY MOUTH ONCE DAILY.  30 tablet  5  . Multiple Vitamins-Minerals (CENTRUM PO) Take 1 tablet by mouth daily.        No current facility-administered medications on file prior to visit.     Review of Systems  Constitutional: Negative for fever, chills and fatigue.  HENT: Positive for ear pain (pressure) and sinus pressure (maxillary sinus tenderness; none today). Negative for congestion, ear discharge, postnasal drip and rhinorrhea.   Eyes: Negative for visual disturbance.  Respiratory: Negative.   Cardiovascular: Negative.   Neurological: Positive for headaches.       Right temporal pain  All other  systems reviewed and are negative.       Objective:  BP 140/68  Pulse 59  Temp(Src) 98 F (36.7 C) (Oral)  Resp 14  Wt 157 lb (71.215 kg)  SpO2 97%   Physical Exam  Constitutional: She is oriented to person, place, and time. She appears well-developed and well-nourished. No distress.  HENT:  Head: Normocephalic and atraumatic.  Right Ear: External ear normal.  Left Ear: External ear normal.  Mouth/Throat: Oropharynx is clear and moist. No oropharyngeal exudate.  Eyes: Conjunctivae and EOM are normal.  Cardiovascular: Normal rate and regular rhythm.   Pulmonary/Chest: Effort normal. No respiratory distress.  Lymphadenopathy:    She has no cervical adenopathy.  Neurological: She is alert and oriented to person, place, and time.  Skin: Skin is warm and dry.  Psychiatric: She has a normal mood and affect. Her behavior is normal. Judgment and thought content normal.       Assessment & Plan:   1. Temporal pain There is no observable inflammation. Pt reports ongoing symptoms since 10/29/13. Pain on right temporal region and down the right side of her face. With the pain going on this long I do not suspect prodromal episode of shingles. Will check labs for temporal arteritis  - CBC with Differential - Sedimentation rate - Rheumatoid factor - C-reactive protein  2. Sinus pressure  This could be sequela of previous sinus infection she reported. Sample of Nasonex provided and asked her to complete the vial. Also start Dimetapp Elixir x 7 days for decongestant. She will need to f/u with PCP if no improvement in symptoms.

## 2013-12-01 NOTE — Addendum Note (Signed)
Addended by: Karlene Einstein D on: 12/01/2013 04:09 PM   Modules accepted: Orders

## 2013-12-01 NOTE — Patient Instructions (Signed)
  Please have your labs drawn prior to leaving.  Start Nasonex 2 sprays into each nostril daily until done.  Take [facility] Dimetapp for the next week.  If symptoms are not improved within 1 week please follow up with your doctor.

## 2013-12-02 ENCOUNTER — Encounter: Payer: Self-pay | Admitting: *Deleted

## 2013-12-02 LAB — RHEUMATOID FACTOR: Rhuematoid fact SerPl-aCnc: <10 IU/mL (ref <=14)

## 2013-12-02 LAB — C-REACTIVE PROTEIN: CRP: <0.5 mg/dL (ref <0.60)

## 2013-12-02 LAB — SEDIMENTATION RATE: Sed Rate: 10 mm/hr (ref 0–22)

## 2014-01-04 ENCOUNTER — Encounter: Payer: Self-pay | Admitting: Internal Medicine

## 2014-01-04 ENCOUNTER — Encounter (INDEPENDENT_AMBULATORY_CARE_PROVIDER_SITE_OTHER): Payer: Self-pay

## 2014-01-04 ENCOUNTER — Ambulatory Visit (INDEPENDENT_AMBULATORY_CARE_PROVIDER_SITE_OTHER): Payer: Medicare Other | Admitting: Internal Medicine

## 2014-01-04 VITALS — BP 140/80 | HR 60 | Temp 97.8°F | Ht 64.0 in | Wt 155.8 lb

## 2014-01-04 DIAGNOSIS — E7800 Pure hypercholesterolemia, unspecified: Secondary | ICD-10-CM

## 2014-01-04 DIAGNOSIS — R5383 Other fatigue: Secondary | ICD-10-CM

## 2014-01-04 DIAGNOSIS — R42 Dizziness and giddiness: Secondary | ICD-10-CM

## 2014-01-04 DIAGNOSIS — I10 Essential (primary) hypertension: Secondary | ICD-10-CM

## 2014-01-04 DIAGNOSIS — Z1211 Encounter for screening for malignant neoplasm of colon: Secondary | ICD-10-CM | POA: Diagnosis not present

## 2014-01-04 DIAGNOSIS — R5381 Other malaise: Secondary | ICD-10-CM

## 2014-01-04 MED ORDER — LISINOPRIL-HYDROCHLOROTHIAZIDE 20-12.5 MG PO TABS: 1.0000 | ORAL_TABLET | Freq: Every day | ORAL | Status: DC

## 2014-01-04 NOTE — Progress Notes (Signed)
Subjective:    Patient ID: Kristen Strickland, female    DOB: 1946/02/24, 68 y.o.   MRN: [id number]  HPI 68 year old female with past history of hypertension and hypercholesterolemia who comes in today to follow up on these issues as well as for a complete physical exam.  States she has been doing relatively well.  Stays active.  No cardiac symptoms with increased activity or exertion.  Breathing stable.  No acid reflux.  Bowels stable.  Discussed cholesterol.  Discussed diet and exercise.  She desires not to take medication.  S/p neck surgery.  Neck is better.  States in 1/15 had congestion, fever and cough.  Subsequently developed headache.  Was evaluated by Raquel in 2/15.  Developed inner ear.  States rolled over in bed and noticed room spinning.  Dizziness persisted.  Some nausea associated.  Took benadryl.  She is better.  She still notices if she turn quick - will feel light headed.  Also notices if she looks up.  No significant congestion.  Just occasional cough and runny nose.  No acid reflux.  No dysphagia.  No nausea or vomiting.  Eating and drinking well.      Past Medical History  Diagnosis Date  . Hypercholesterolemia   . Hypertension   . Arthritis     Current Outpatient Prescriptions on File Prior to Visit  Medication Sig Dispense Refill  . aspirin EC 81 MG tablet Take 81 mg by mouth daily.      Marland Kitchen estradiol (CLIMARA - DOSED IN MG/24 HR) 0.1 mg/24hr patch APPLY 1 PATCH ON SKIN ONCE WEEKLY.  4 patch  5  . ibuprofen (ADVIL,MOTRIN) 200 MG tablet Take 200 mg by mouth every 8 (eight) hours as needed for pain, fever or headache.       . lisinopril-hydrochlorothiazide (PRINZIDE,ZESTORETIC) 10-12.5 MG per tablet TAKE 1 TABLET BY MOUTH ONCE DAILY.  30 tablet  5  . Multiple Vitamins-Minerals (CENTRUM PO) Take 1 tablet by mouth daily.        No current facility-administered medications on file prior to visit.    Review of Systems Occasional headache.  No severe headache.  Describes the  light headedness and dizziness as outlined.  No vision change.  No significant sinus symptoms.  No chest pain, tightness or palpitations.  No increased shortness of breath, cough or congestion.  No nausea or vomiting. No actual acid reflux.  Does report feeling like something hanging in her throat.  No abdominal pain or cramping.  No bowel change, such as diarrhea, constipation, BRBPR or melana.  No urine change.  Neck is better since her surgery.        Objective:   Physical Exam  Filed Vitals:   01/04/14 0752  BP: 140/80  Pulse: 60  Temp: 97.8 F (36.6 C)   Blood pressure recheck:  37/70-33  68 year old female in no acute distress.   HEENT:  Nares- clear.  Oropharynx - without lesions. NECK:  Supple.  Nontender.  No audible bruit.  HEART:  Appears to be regular. LUNGS:  No crackles or wheezing audible.  Respirations even and unlabored.  RADIAL PULSE:  Equal bilaterally.    BREASTS:  No nipple discharge or nipple retraction present.  Could not appreciate any distinct nodules or axillary adenopathy.  ABDOMEN:  Soft, nontender.  Bowel sounds present and normal.  No audible abdominal bruit.  GU:  Not performed.    EXTREMITIES:  No increased edema present.  DP pulses palpable and  equal bilaterally.          Assessment & Plan:  HEADACHE.  Describes occasional headaches.  Not severe.  Follow.    GYN.  Pelvic and pap 08/19/12.  Negative.    GI.  Describes feeling something in her throat at times.  No actual acid reflux or significant dysphagia.  Overdue colonoscopy.  Refer to GI.     HEALTH MAINTENANCE.  Physical today.  Mammogram 12/01/12 - Birads II.  Schedule a f/u mammogram.  Pap negative. Has declined colonoscopy.  Discussed with her.

## 2014-01-04 NOTE — Progress Notes (Signed)
Pre-visit discussion using our clinic review tool. No additional management support is needed unless otherwise documented below in the visit note.  

## 2014-01-08 ENCOUNTER — Encounter: Payer: Self-pay | Admitting: Internal Medicine

## 2014-01-08 DIAGNOSIS — R42 Dizziness and giddiness: Secondary | ICD-10-CM | POA: Insufficient documentation

## 2014-01-08 NOTE — Assessment & Plan Note (Signed)
History of elevated cholesterol.  Low cholesterol diet and exercise.  Desires not to take a statin.  Follow lipid profile.    

## 2014-01-08 NOTE — Assessment & Plan Note (Signed)
Persistent intermittent dizziness.  Symptoms as outlined.  Will refer to ENT given persistence.

## 2014-01-08 NOTE — Assessment & Plan Note (Signed)
Blood pressure still elevated.  Will increase lisinopril/hctz to 20/12.5.  Follow pressures.  Follow metabolic panel.

## 2014-01-12 DIAGNOSIS — J0100 Acute maxillary sinusitis, unspecified: Secondary | ICD-10-CM | POA: Diagnosis not present

## 2014-01-12 DIAGNOSIS — R42 Dizziness and giddiness: Secondary | ICD-10-CM | POA: Diagnosis not present

## 2014-01-12 IMAGING — MR MR CERVICAL SPINE WO/W CM
5 of 8 series · 27 of 48 positions shown · IV contrast (multihance)
Comparison: None.

CLINICAL DATA: Right arm pain and weakness.  Neck pain

MRI CERVICAL SPINE WITHOUT AND WITH CONTRAST
TECHNIQUE: Multiplanar and multiecho pulse sequences of the
cervical spine, to include the craniocervical junction and
cervicothoracic junction, were obtained according to standard
protocol without and with intravenous contrast.
Contrast: 15mL MULTIHANCE GADOBENATE DIMEGLUMINE 529 MG/ML IV SOLN

[Series 2: T2 · sagittal · 3.0mm · 0.43mm/px · 4 of 12 slices shown (1 of 2)]
[im 1/12]
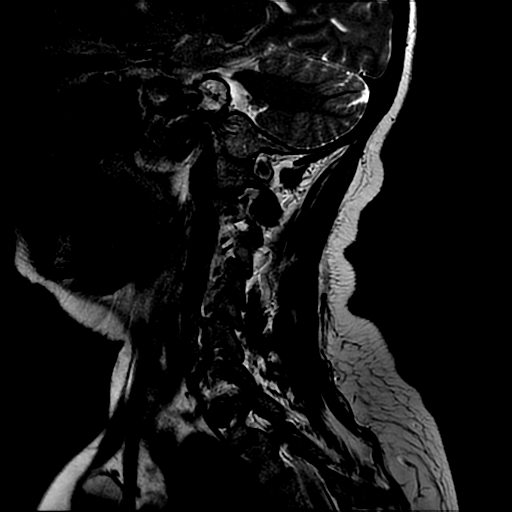
[im 4/12]
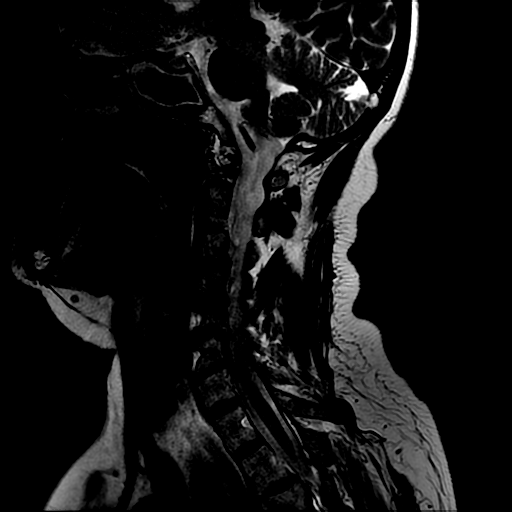
[im 8/12]
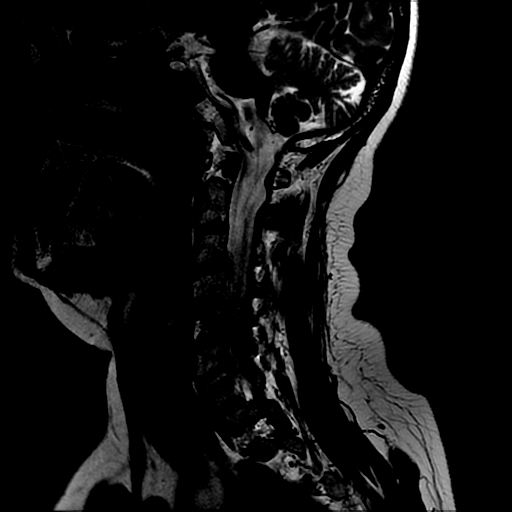
[im 12/12]
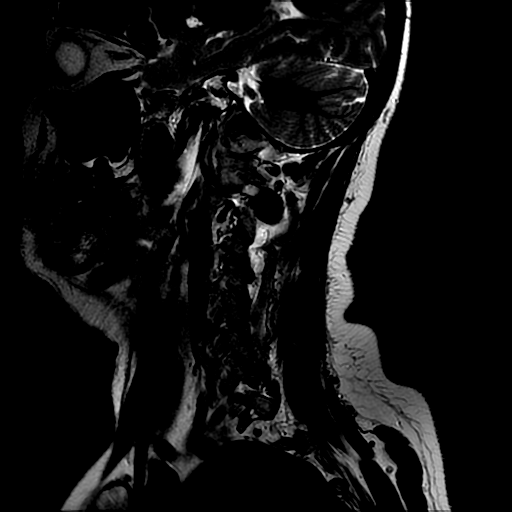

[Series 3: T1 · sagittal · 3.0mm · 0.43mm/px · 4 of 12 slices shown (1 of 2)]
[im 1/12]
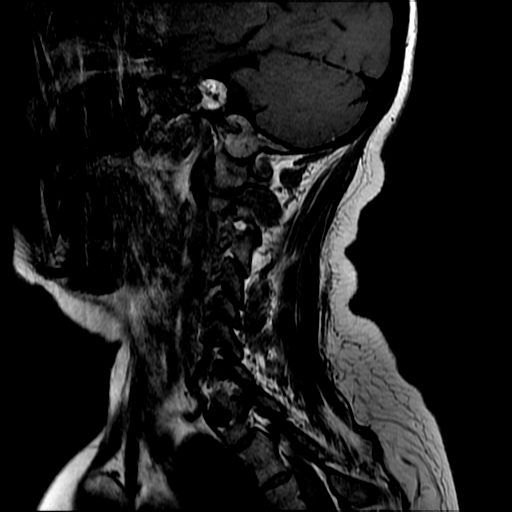
[im 4/12]
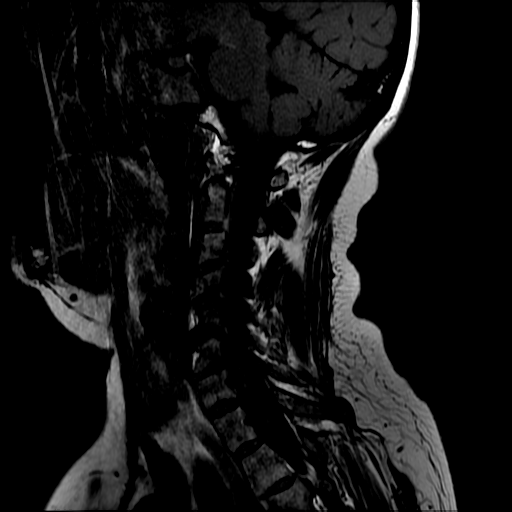
[im 8/12]
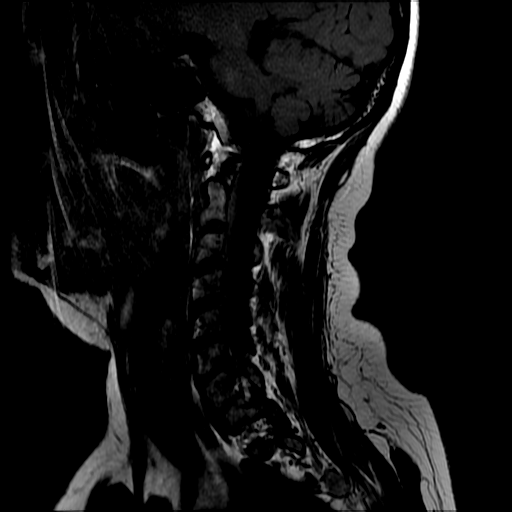
[im 12/12]
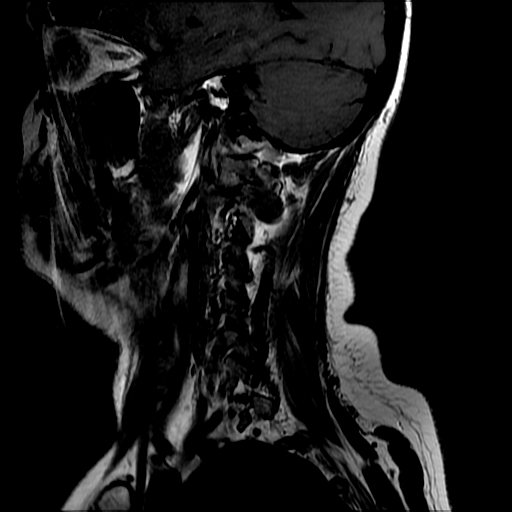

[Series 6: T2 · axial · 3.0mm · 0.39mm/px · z∈[-42,+43]mm · 8 of 26 slices shown (2 of 2)]
[im 1/26]
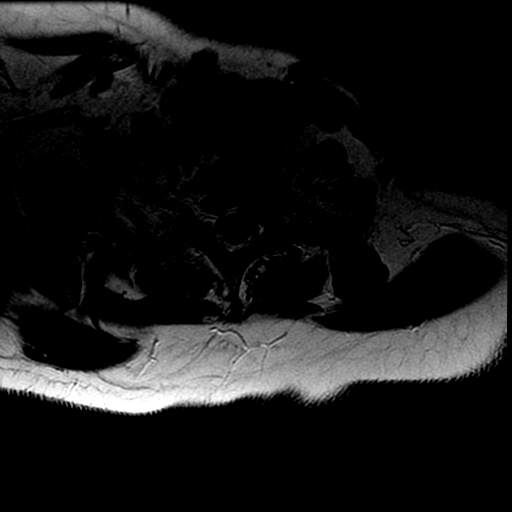
[im 4/26]
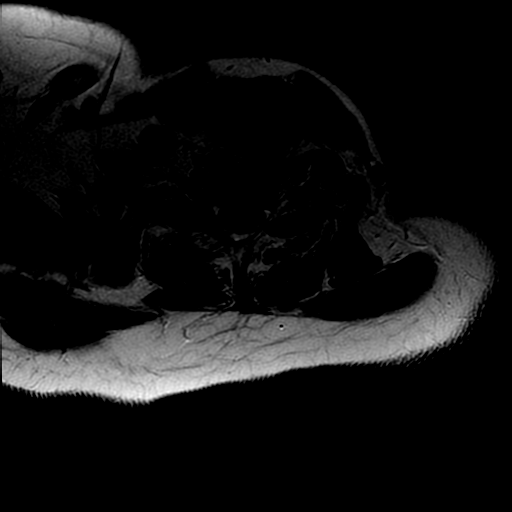
[im 8/26]
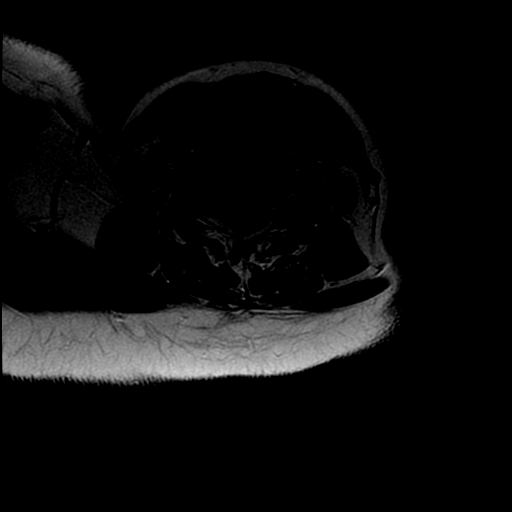
[im 11/26]
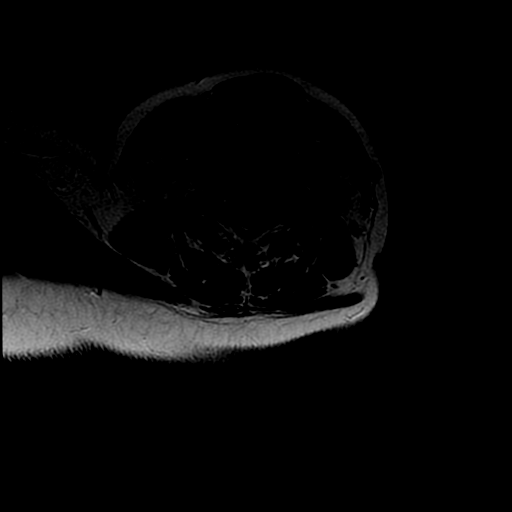
[im 15/26]
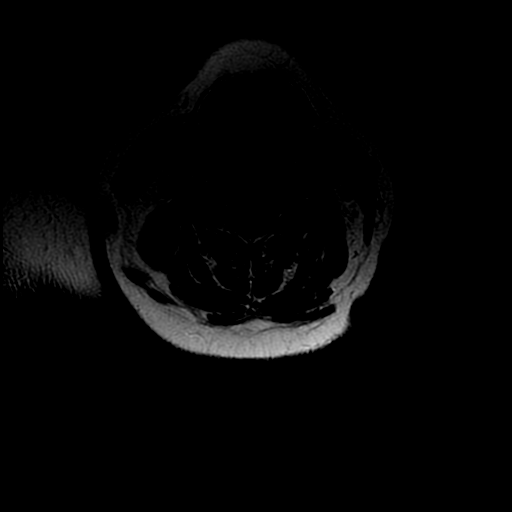
[im 18/26]
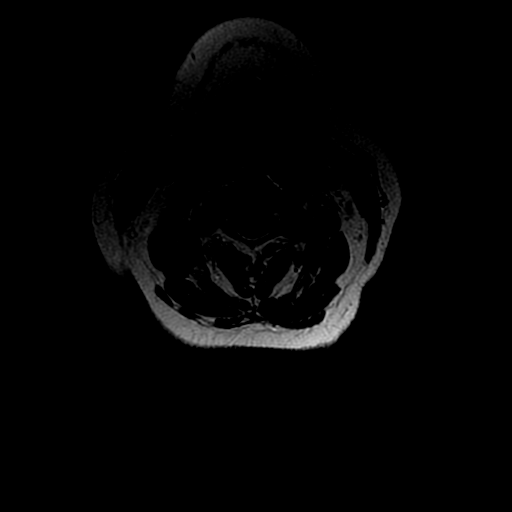
[im 22/26]
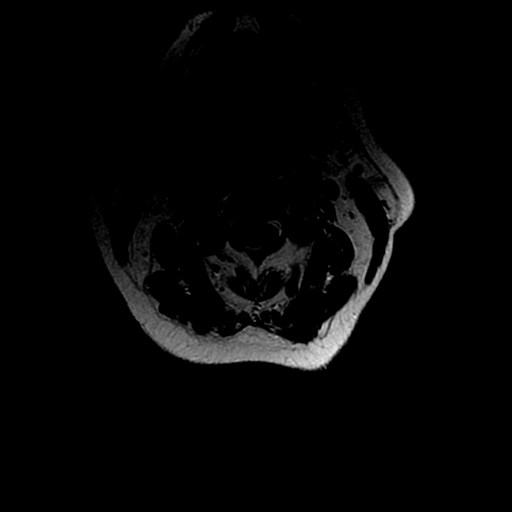
[im 26/26]
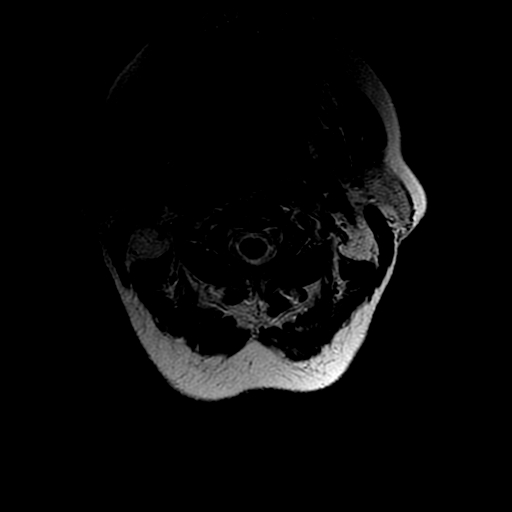

[Series 7: T1 · axial · non-contrast · 3.0mm · 0.78mm/px · z∈[-48,+49]mm · 8 of 26 slices shown (2 of 2)]
[im 1/26]
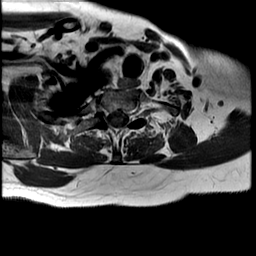
[im 4/26]
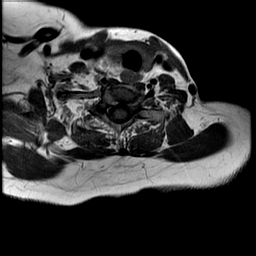
[im 8/26]
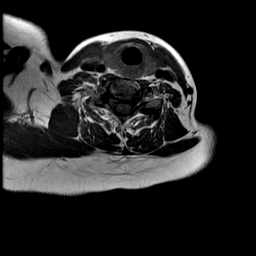
[im 11/26]
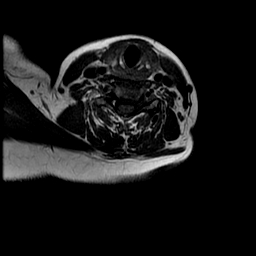
[im 15/26]
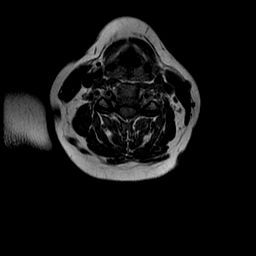
[im 18/26]
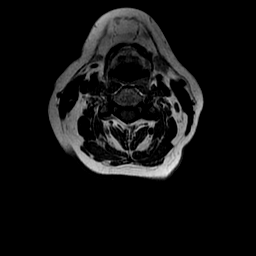
[im 22/26]
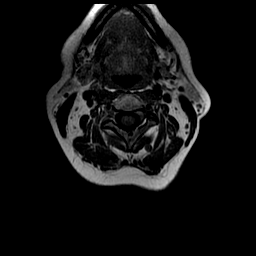
[im 26/26]
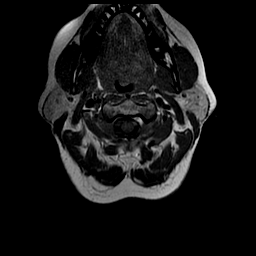

[Series 11: T1 fat-sat post-contrast · sagittal · 3.0mm · 0.86mm/px · 3 of 12 slices shown]
[im 1/12]
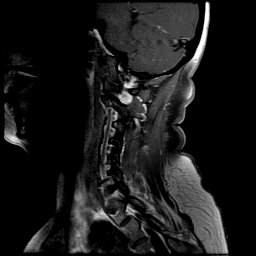
[im 4/12]
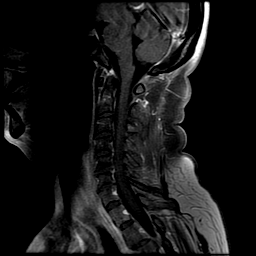
[im 8/12]
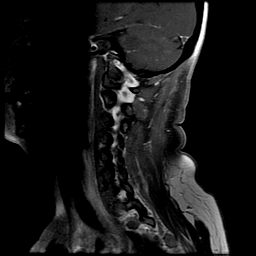

[27 of 48 positions shown; findings below may reference images not displayed]

FINDINGS: Normal cervical alignment.  Negative for fracture or
mass.  Spinal cord signal is normal.  No enhancing lesions are seen
following contrast administration.  Brainstem is normal.

C2-3:  Negative

C3-4:  Mild disc degeneration with early spurring.  No significant
spinal stenosis.

C4-5:  Disc degeneration and mild spondylosis.  Mild spinal
stenosis.

C5-6:  Disc degeneration and moderate spondylosis.  There is
diffuse uncinate spurring causing foraminal encroachment
bilaterally and mild spinal stenosis.

C6-7:  Disc degeneration and spondylosis causing foraminal
encroachment bilaterally and mild spinal stenosis

C7-T1:  Negative
IMPRESSION: Mild spondylosis C3-4 and C4-5.

Moderate spondylosis at C5-6 and C6-7 causing spinal stenosis and
foraminal encroachment bilaterally.  No acute disc protrusion.

## 2014-01-13 ENCOUNTER — Telehealth: Payer: Self-pay | Admitting: *Deleted

## 2014-01-13 NOTE — Telephone Encounter (Signed)
Called [employer] [facility] Prior Authorization form is being faxed over

## 2014-01-13 NOTE — Telephone Encounter (Signed)
Received PA request form placed in Dr.scott folder

## 2014-01-18 ENCOUNTER — Other Ambulatory Visit (INDEPENDENT_AMBULATORY_CARE_PROVIDER_SITE_OTHER): Payer: Medicare Other

## 2014-01-18 DIAGNOSIS — I10 Essential (primary) hypertension: Secondary | ICD-10-CM | POA: Diagnosis not present

## 2014-01-18 DIAGNOSIS — E7800 Pure hypercholesterolemia, unspecified: Secondary | ICD-10-CM | POA: Diagnosis not present

## 2014-01-18 DIAGNOSIS — R5381 Other malaise: Secondary | ICD-10-CM | POA: Diagnosis not present

## 2014-01-18 DIAGNOSIS — R5383 Other fatigue: Secondary | ICD-10-CM | POA: Diagnosis not present

## 2014-01-18 LAB — COMPREHENSIVE METABOLIC PANEL
ALT: 29 U/L (ref 0–35)
AST: 24 U/L (ref 0–37)
Albumin: 3.9 g/dL (ref 3.5–5.2)
Alkaline Phosphatase: 58 U/L (ref 39–117)
BUN: 13 mg/dL (ref 6–23)
CO2: 28 mEq/L (ref 19–32)
Calcium: 9.2 mg/dL (ref 8.4–10.5)
Chloride: 103 mEq/L (ref 96–112)
Creatinine, Ser: 0.7 mg/dL (ref 0.4–1.2)
GFR: 90.27 mL/min (ref >60.00)
Glucose, Bld: 98 mg/dL (ref 70–99)
Potassium: 4.4 mEq/L (ref 3.5–5.1)
Sodium: 139 mEq/L (ref 135–145)
Total Bilirubin: 1.1 mg/dL (ref 0.3–1.2)
Total Protein: 6.8 g/dL (ref 6.0–8.3)

## 2014-01-18 LAB — LIPID PANEL
Cholesterol: 230 mg/dL — ABNORMAL HIGH (ref 0–200)
HDL: 46.6 mg/dL (ref >39.00)
LDL Cholesterol: 157 mg/dL — ABNORMAL HIGH (ref 0–99)
Total CHOL/HDL Ratio: 5
Triglycerides: 132 mg/dL (ref 0.0–149.0)
VLDL: 26.4 mg/dL (ref 0.0–40.0)

## 2014-01-18 LAB — TSH: TSH: 4.76 u[IU]/mL (ref 0.35–5.50)

## 2014-01-19 ENCOUNTER — Other Ambulatory Visit: Payer: Self-pay | Admitting: Internal Medicine

## 2014-01-19 DIAGNOSIS — E7800 Pure hypercholesterolemia, unspecified: Secondary | ICD-10-CM

## 2014-01-19 NOTE — Progress Notes (Signed)
Order placed for labs.

## 2014-01-20 NOTE — Telephone Encounter (Signed)
PA request for Estradiol 0.1mg /24hr weekly transderm patch was denied. Medication is considered a high-risk medication in patients over the age of 47. (Denial notice place in your folder)

## 2014-01-24 ENCOUNTER — Encounter: Payer: Self-pay | Admitting: *Deleted

## 2014-01-24 NOTE — Telephone Encounter (Signed)
Notify pt that I sent in information for her estrogen patch and that her insurance is denying coverage.

## 2014-01-24 NOTE — Telephone Encounter (Signed)
Letter mailed

## 2014-01-30 ENCOUNTER — Other Ambulatory Visit: Payer: Self-pay | Admitting: Internal Medicine

## 2014-01-30 NOTE — Progress Notes (Signed)
Opened in error

## 2014-01-31 DIAGNOSIS — J342 Deviated nasal septum: Secondary | ICD-10-CM | POA: Diagnosis not present

## 2014-01-31 DIAGNOSIS — R42 Dizziness and giddiness: Secondary | ICD-10-CM | POA: Diagnosis not present

## 2014-02-01 DIAGNOSIS — F458 Other somatoform disorders: Secondary | ICD-10-CM | POA: Diagnosis not present

## 2014-02-01 DIAGNOSIS — Z1211 Encounter for screening for malignant neoplasm of colon: Secondary | ICD-10-CM | POA: Diagnosis not present

## 2014-02-01 DIAGNOSIS — K3189 Other diseases of stomach and duodenum: Secondary | ICD-10-CM | POA: Diagnosis not present

## 2014-02-01 DIAGNOSIS — R1013 Epigastric pain: Secondary | ICD-10-CM | POA: Diagnosis not present

## 2014-02-02 ENCOUNTER — Encounter: Payer: Self-pay | Admitting: *Deleted

## 2014-02-03 IMAGING — DX DG CERVICAL SPINE 2 OR 3 VIEWS
2 series · 2 of 2 positions shown · non-contrast
Comparison: Preoperative MRI of the cervical spine 03/18/2013

CLINICAL DATA: ACDF C5-C6 and C6-C7

CERVICAL SPINE - 2-3 VIEW

[lat (1 of 2)]
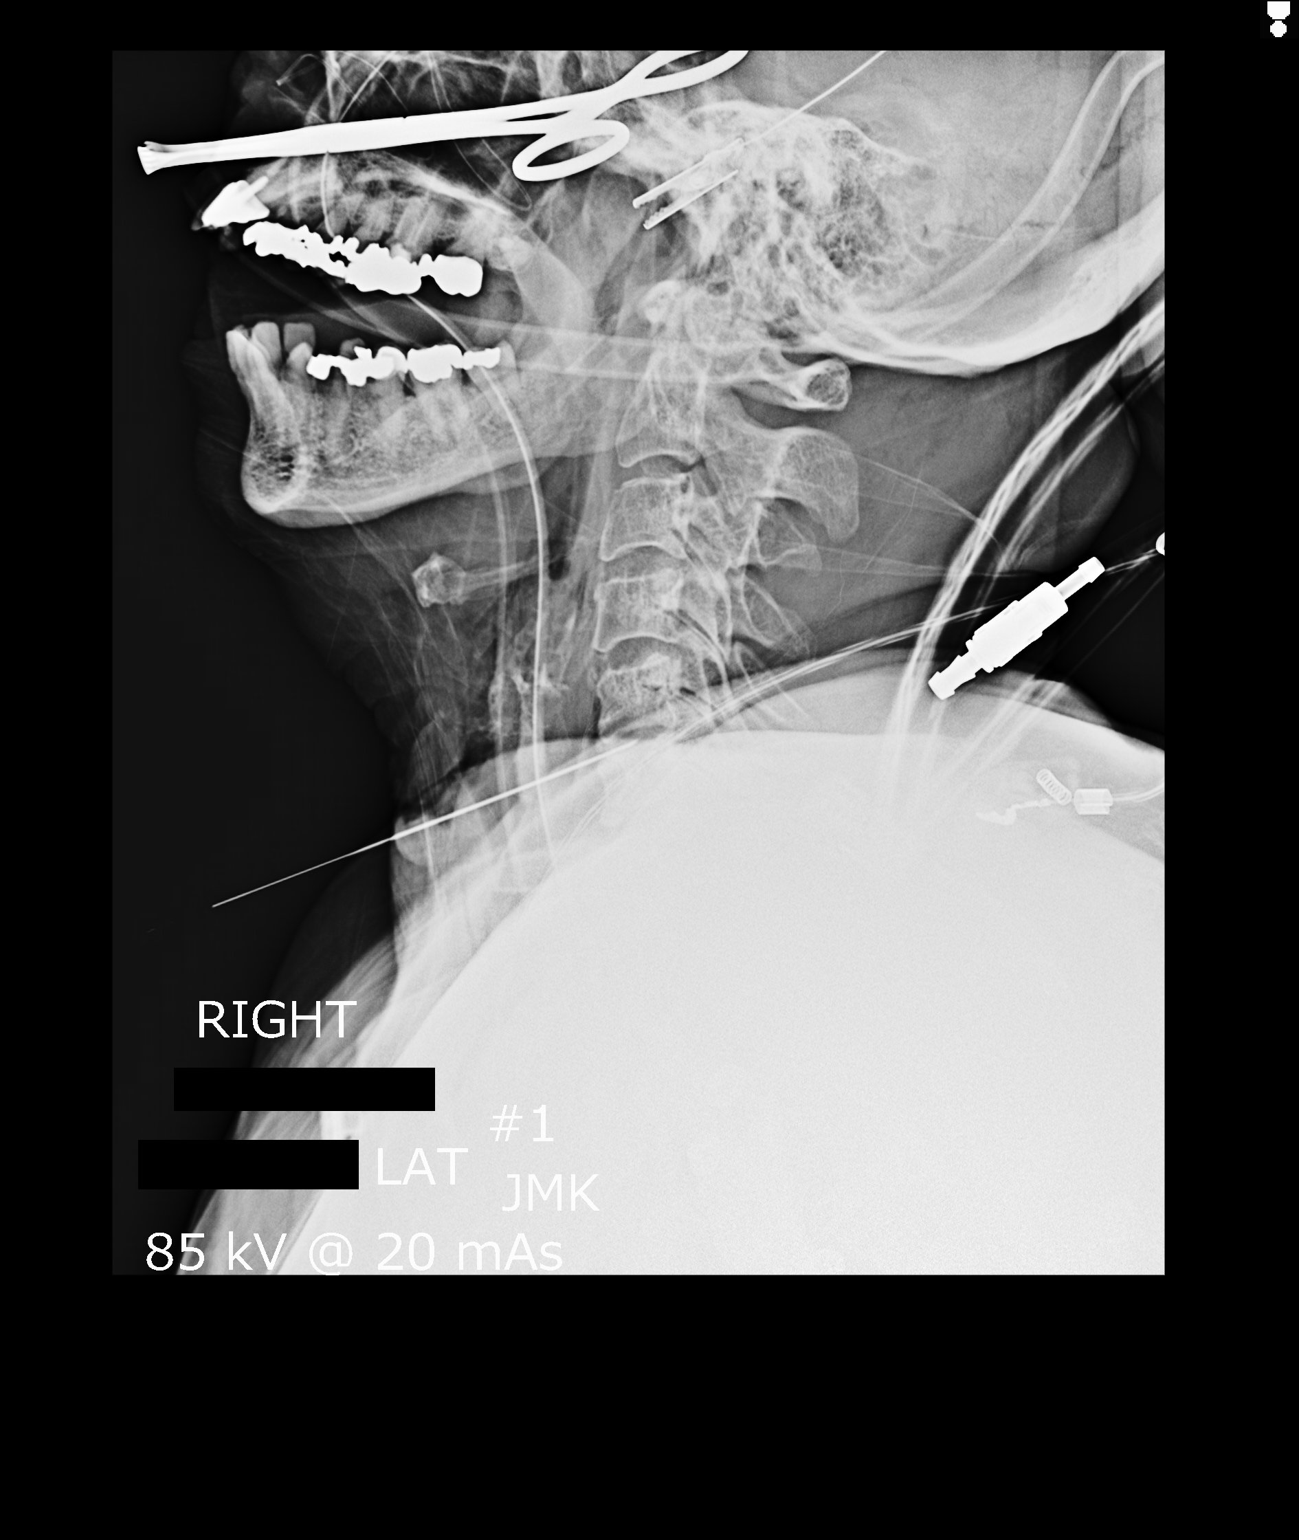

[lat (2 of 2)]
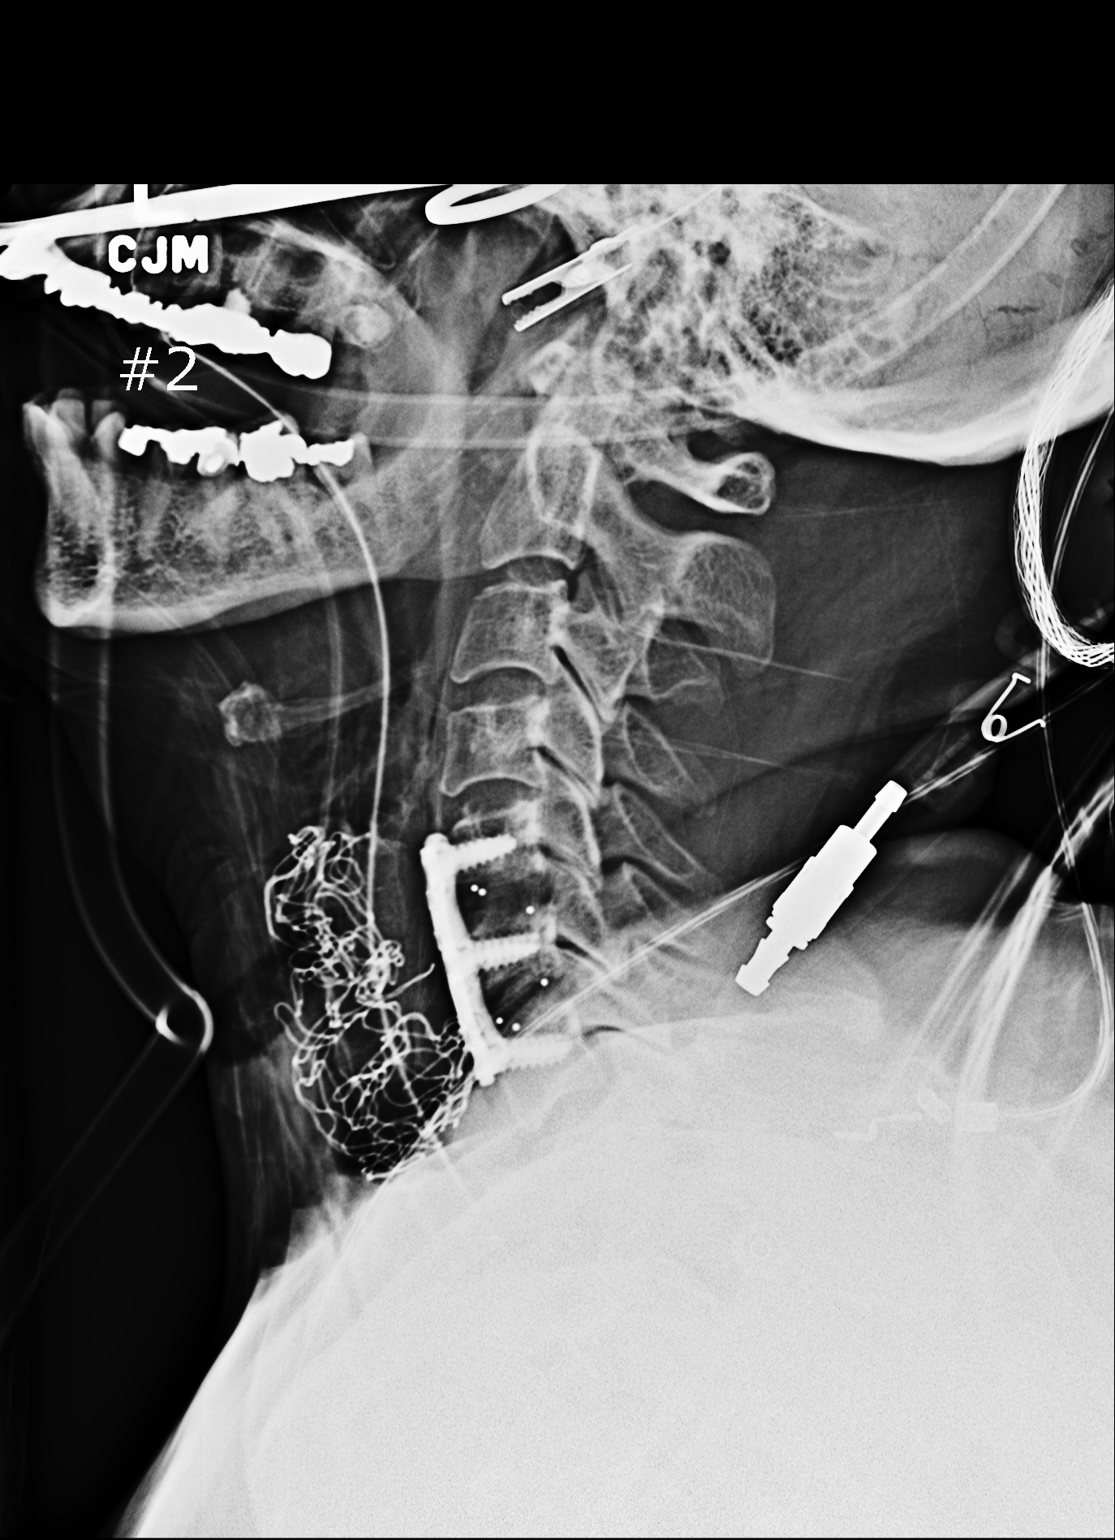

[2 of 2 positions shown; findings below may reference images not displayed]

FINDINGS: Two intraoperative cross-table lateral radiographs are
submitted for interpretation.  The first image demonstrates a
metallic marker positioned at the C5-C6 interspace.  The second
image demonstrates interval surgical changes of the C5-C7 anterior
cervical discectomy and fusion with interbody spacers at C5-C6 and
C6-C7.  No evidence of immediate hardware complication.  Alignment
remains anatomic.  The patient is intubated.  Serpentine linear
radiopacity anterior to the spine likely reflects the radiopaque
marker on a retained sponge.
IMPRESSION: 1.  Surgical changes of ACDF of C5-C7 as above.

2.  Serpentine linear radiopacity anterior to the spine likely
reflects the radiopaque marker on a surgical sponge.

## 2014-02-03 IMAGING — CR DG CHEST 2V
2 series · 2 of 2 positions shown · non-contrast
Comparison: None.

CLINICAL DATA: Preoperative examination (cervical fusion)

CHEST - 2 VIEW

[w chest pa]
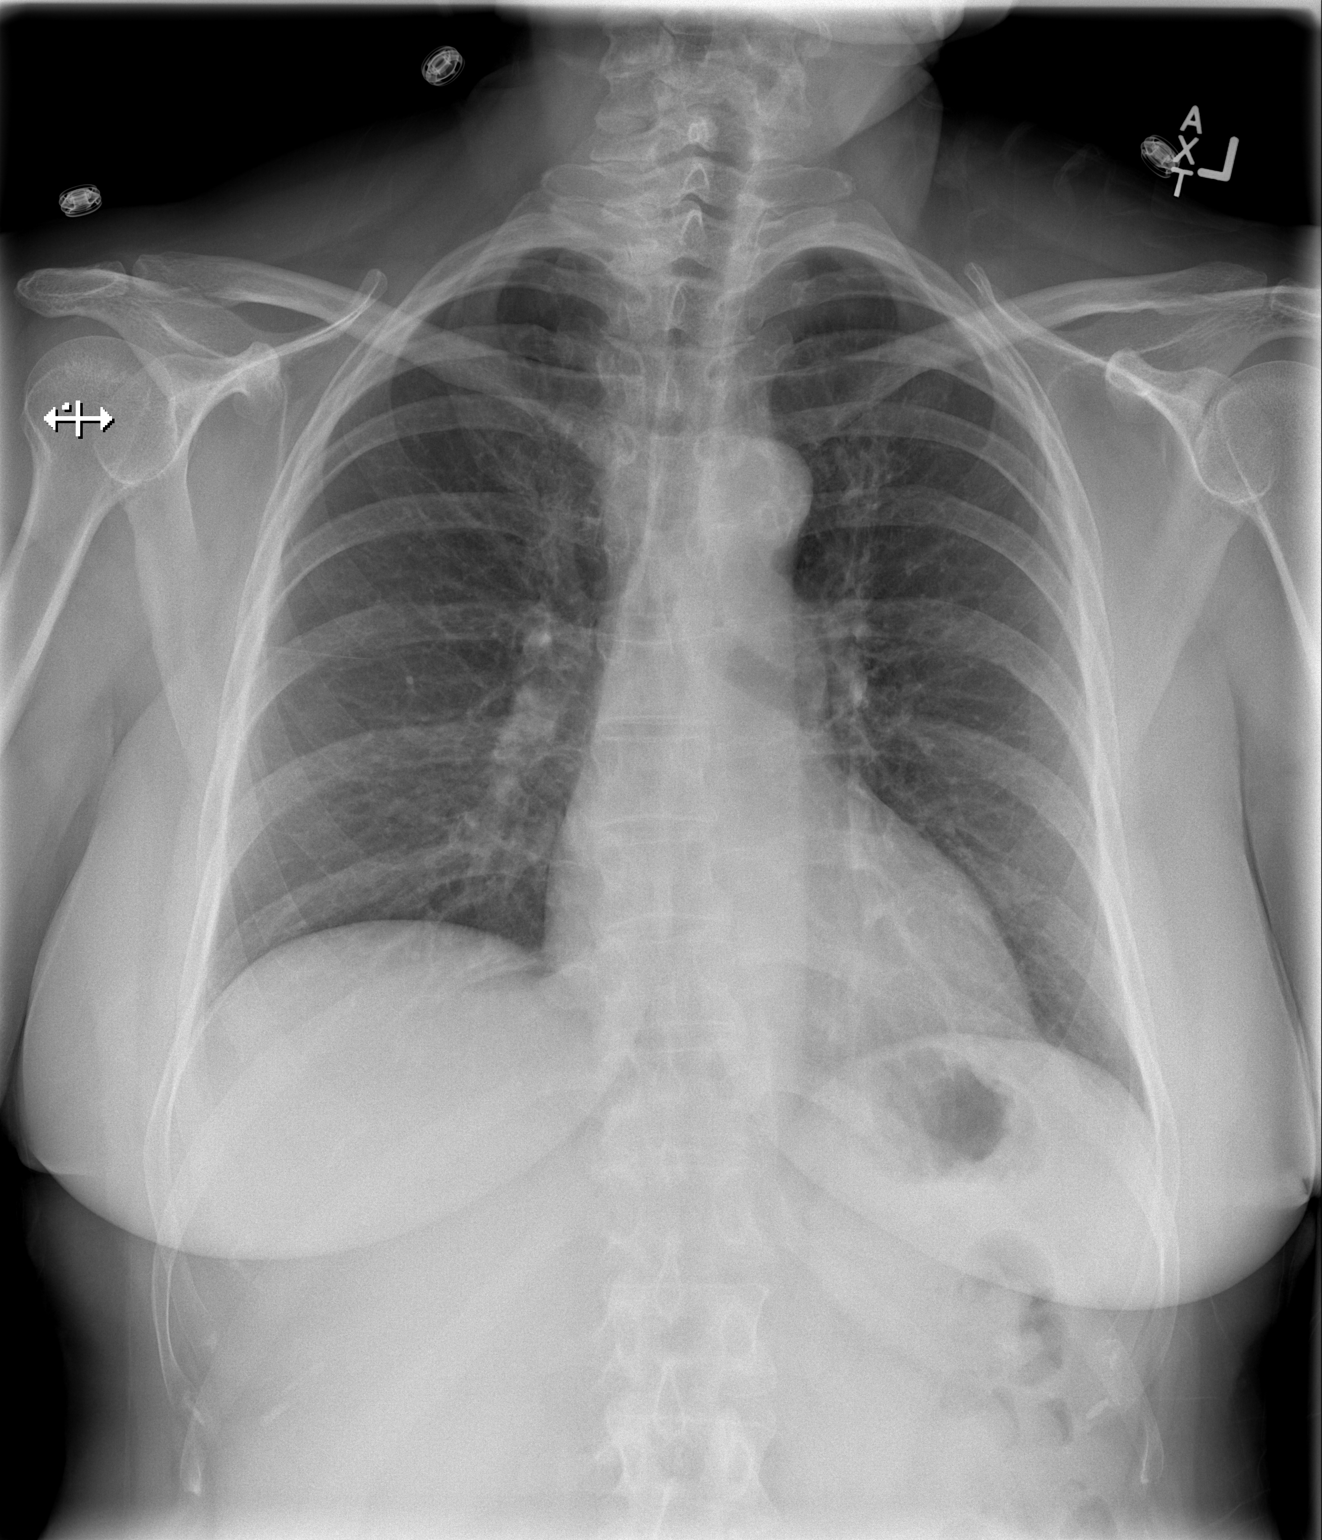

[w chest lat]
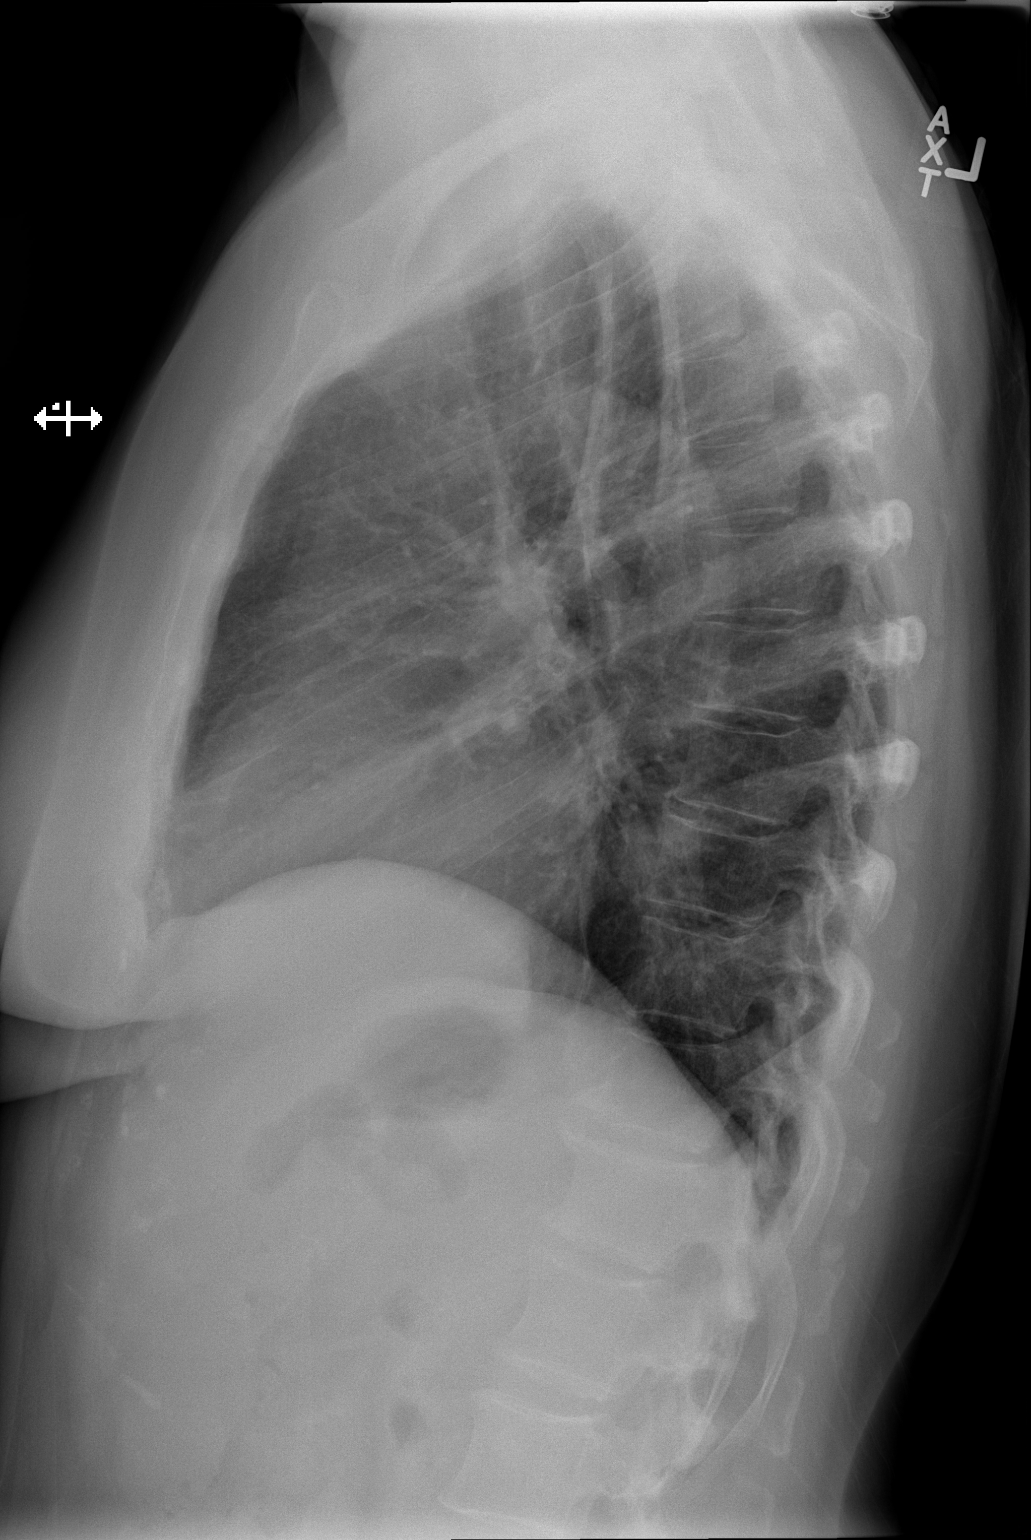

[2 of 2 positions shown; findings below may reference images not displayed]

FINDINGS: Normal cardiac silhouette and mediastinal contours.  There is mild
diffuse thickening of the pulmonary interstitium.  There is a
minimal amount of pleural parenchymal thickening along the right
minor fissure.  There is mild elevation of the right hemidiaphragm.
No focal airspace opacity.  No pleural effusion or pneumothorax.
No definite evidence of edema.  Unchanged bones.
IMPRESSION: Mild bronchitic change without acute cardiopulmonary disease.

## 2014-02-16 DIAGNOSIS — K648 Other hemorrhoids: Secondary | ICD-10-CM | POA: Diagnosis not present

## 2014-02-16 DIAGNOSIS — K621 Rectal polyp: Secondary | ICD-10-CM | POA: Diagnosis not present

## 2014-02-16 DIAGNOSIS — R1013 Epigastric pain: Secondary | ICD-10-CM | POA: Diagnosis not present

## 2014-02-16 DIAGNOSIS — K3189 Other diseases of stomach and duodenum: Secondary | ICD-10-CM | POA: Diagnosis not present

## 2014-02-16 DIAGNOSIS — D131 Benign neoplasm of stomach: Secondary | ICD-10-CM | POA: Diagnosis not present

## 2014-02-16 DIAGNOSIS — Z1211 Encounter for screening for malignant neoplasm of colon: Secondary | ICD-10-CM | POA: Diagnosis not present

## 2014-02-16 DIAGNOSIS — K2100 Gastro-esophageal reflux disease with esophagitis, without bleeding: Secondary | ICD-10-CM | POA: Diagnosis not present

## 2014-02-16 DIAGNOSIS — K620 Anal polyp: Secondary | ICD-10-CM | POA: Diagnosis not present

## 2014-02-16 DIAGNOSIS — F458 Other somatoform disorders: Secondary | ICD-10-CM | POA: Diagnosis not present

## 2014-02-22 ENCOUNTER — Encounter: Payer: Self-pay | Admitting: Internal Medicine

## 2014-02-22 ENCOUNTER — Ambulatory Visit (INDEPENDENT_AMBULATORY_CARE_PROVIDER_SITE_OTHER): Payer: Medicare Other | Admitting: Internal Medicine

## 2014-02-22 VITALS — BP 130/70 | HR 60 | Temp 97.9°F | Ht 64.0 in | Wt 153.8 lb

## 2014-02-22 DIAGNOSIS — R42 Dizziness and giddiness: Secondary | ICD-10-CM | POA: Diagnosis not present

## 2014-02-22 DIAGNOSIS — Z1239 Encounter for other screening for malignant neoplasm of breast: Secondary | ICD-10-CM

## 2014-02-22 DIAGNOSIS — M25519 Pain in unspecified shoulder: Secondary | ICD-10-CM

## 2014-02-22 DIAGNOSIS — N951 Menopausal and female climacteric states: Secondary | ICD-10-CM

## 2014-02-22 DIAGNOSIS — I10 Essential (primary) hypertension: Secondary | ICD-10-CM

## 2014-02-22 DIAGNOSIS — E7800 Pure hypercholesterolemia, unspecified: Secondary | ICD-10-CM

## 2014-02-22 DIAGNOSIS — M25511 Pain in right shoulder: Secondary | ICD-10-CM

## 2014-02-22 NOTE — Progress Notes (Signed)
Pre visit review using our clinic review tool, if applicable. No additional management support is needed unless otherwise documented below in the visit note. 

## 2014-02-22 NOTE — Assessment & Plan Note (Addendum)
Neck pain better s/p anterior cervical decompression.  Not reported as a significant issues today.  Follow.

## 2014-02-22 NOTE — Assessment & Plan Note (Addendum)
Not reported as a significant issue today.  Follow.   

## 2014-02-22 NOTE — Assessment & Plan Note (Addendum)
Blood pressure better on lisinopril/hctz 20/12.5.  Continue to follow pressures.  Follow metabolic panel.   

## 2014-02-25 NOTE — Telephone Encounter (Signed)
LMTCB-need to know if pt has tried anything prior to the patches. Also need to know if she was using the patch for hot flashes so that we can find an alternative medication.

## 2014-02-27 ENCOUNTER — Telehealth: Payer: Self-pay | Admitting: Internal Medicine

## 2014-02-27 ENCOUNTER — Encounter: Payer: Self-pay | Admitting: Internal Medicine

## 2014-02-27 DIAGNOSIS — N951 Menopausal and female climacteric states: Secondary | ICD-10-CM | POA: Insufficient documentation

## 2014-02-27 NOTE — Telephone Encounter (Signed)
Notify pt that I did speak to her pharmacy.  The insurance is now not covering the generic form of her patch.  I gave her samples of premarin.  Tell her to call us in a couple of weeks after starting premarin.  If she is doing ok, then I will send in a prescription for premarin.  Thanks.

## 2014-02-27 NOTE — Assessment & Plan Note (Signed)
History of elevated cholesterol.  Low cholesterol diet and exercise.  Desires not to take a statin.  Follow lipid profile.    

## 2014-02-27 NOTE — Progress Notes (Signed)
  Subjective:    Patient ID: Kristen Strickland, female    DOB: Aug 23, 1946, 68 y.o.   MRN: [id number]  HPI 68 year old female with past history of hypertension and hypercholesterolemia who comes in today for a scheduled follow up.  States she has been doing relatively well.  Stays active.  No cardiac symptoms with increased activity or exertion.  Breathing stable.  No acid reflux.  Bowels stable.  She is here to f/u on her blood pressure.  We changed her medication last visit.  Tolerating.  Blood pressure appears to be doing better.  She is concerned because her insurance is not covering her estradiol patch.  Needs the estrogen for her menopausal symptoms.  Feels better on the estrogen.  We discussed the need to try and taper off the medication.  Overall, otherwise doing well.       Past Medical History  Diagnosis Date  . Hypercholesterolemia   . Hypertension   . Arthritis     Current Outpatient Prescriptions on File Prior to Visit  Medication Sig Dispense Refill  . aspirin EC 81 MG tablet Take 81 mg by mouth daily.      Marland Kitchen estradiol (CLIMARA - DOSED IN MG/24 HR) 0.1 mg/24hr patch APPLY 1 PATCH ON SKIN ONCE WEEKLY.  4 patch  5  . ibuprofen (ADVIL,MOTRIN) 200 MG tablet Take 200 mg by mouth every 8 (eight) hours as needed for pain, fever or headache.       . lisinopril-hydrochlorothiazide (ZESTORETIC) 20-12.5 MG per tablet Take 1 tablet by mouth daily.  30 tablet  2  . Multiple Vitamins-Minerals (CENTRUM PO) Take 1 tablet by mouth daily.        No current facility-administered medications on file prior to visit.    Review of Systems No headache reported.  No vision change.  No significant sinus symptoms.  No chest pain, tightness or palpitations.  No increased shortness of breath, cough or congestion.  No nausea or vomiting. No acid reflux.  No abdominal pain or cramping.  No bowel change, such as diarrhea, constipation, BRBPR or melana.  Blood pressure doing better.  On estrogen.  See above.          Objective:   Physical Exam  Filed Vitals:   02/22/14 1102  BP: 130/70  Pulse: 60  Temp: 97.9 F (63.23 C)   68 year old female in no acute distress.   HEENT:  Nares- clear.  Oropharynx - without lesions. NECK:  Supple.  Nontender.  No audible bruit.  HEART:  Appears to be regular. LUNGS:  No crackles or wheezing audible.  Respirations even and unlabored.  RADIAL PULSE:  Equal bilaterally.   ABDOMEN:  Soft, nontender.  Bowel sounds present and normal.  No audible abdominal bruit.     EXTREMITIES:  No increased edema present.  DP pulses palpable and equal bilaterally.          Assessment & Plan:  GYN.  Pelvic and pap 08/19/12.  Negative.    GI. Referred to GI last visit.    HEALTH MAINTENANCE.  Physical 01/04/14.  Mammogram 12/01/12 - Birads II.  Schedule a f/u mammogram.  Pap (08/19/12) - negative.  Referred to GI last visit.

## 2014-02-27 NOTE — Assessment & Plan Note (Signed)
On estradiol patch to control symptoms.  Has tried to stop and did not do well.  Insurance refusing to cover.  Had samples of the premarin.  Will start her on .625mg  q day.  Hopefully then can taper to .3mg  q day.  Samples given.  Instructed her on how to take the medication.  Follow.  Will see if can gradually taper her off the premarin.

## 2014-02-28 NOTE — Telephone Encounter (Signed)
Left message, notifying pt, and to call back with any further questions.

## 2014-03-04 NOTE — Telephone Encounter (Signed)
Left message for pt to return my call.

## 2014-03-07 ENCOUNTER — Other Ambulatory Visit: Payer: Self-pay | Admitting: Internal Medicine

## 2014-03-22 ENCOUNTER — Encounter: Payer: Self-pay | Admitting: Internal Medicine

## 2014-03-22 ENCOUNTER — Ambulatory Visit (INDEPENDENT_AMBULATORY_CARE_PROVIDER_SITE_OTHER)
Admission: RE | Admit: 2014-03-22 | Discharge: 2014-03-22 | Disposition: A | Discharge status: Home / Self Care | Payer: Medicare Other | Source: Ambulatory Visit | Attending: Internal Medicine | Admitting: Internal Medicine

## 2014-03-22 ENCOUNTER — Ambulatory Visit (INDEPENDENT_AMBULATORY_CARE_PROVIDER_SITE_OTHER): Payer: Medicare Other | Admitting: Internal Medicine

## 2014-03-22 VITALS — BP 124/70 | HR 60 | Temp 97.9°F | Ht 64.0 in | Wt 153.5 lb

## 2014-03-22 DIAGNOSIS — M25569 Pain in unspecified knee: Secondary | ICD-10-CM

## 2014-03-22 DIAGNOSIS — I10 Essential (primary) hypertension: Secondary | ICD-10-CM

## 2014-03-22 DIAGNOSIS — M25561 Pain in right knee: Secondary | ICD-10-CM

## 2014-03-22 DIAGNOSIS — M1120 Other chondrocalcinosis, unspecified site: Secondary | ICD-10-CM | POA: Diagnosis not present

## 2014-03-22 NOTE — Progress Notes (Signed)
Pre visit review using our clinic review tool, if applicable. No additional management support is needed unless otherwise documented below in the visit note. 

## 2014-03-27 ENCOUNTER — Encounter: Payer: Self-pay | Admitting: Internal Medicine

## 2014-03-27 DIAGNOSIS — M25561 Pain in right knee: Secondary | ICD-10-CM | POA: Insufficient documentation

## 2014-03-27 NOTE — Assessment & Plan Note (Signed)
Persistent.  Discussed taking scheduled tylenol and gently use of ibuprofen.  Check xray of right knee.  Further w/up pending.

## 2014-03-27 NOTE — Progress Notes (Signed)
  Subjective:    Patient ID: Kristen Strickland, female    DOB: 02-19-1946, 68 y.o.   MRN: [id number]  Knee Pain   68 year old female with past history of hypertension and hypercholesterolemia who comes in today as a work in with concerns regarding persistent right knee pain.  States worsened approximately one month ago.  States she limps when she walks.  Knee caused her to fall recently.  Caught her right foot.  States occasionally will flare worse.  No increased swelling today.   Is taking ibuprofen prn.  Tolerating.      Past Medical History  Diagnosis Date  . Hypercholesterolemia   . Hypertension   . Arthritis     Current Outpatient Prescriptions on File Prior to Visit  Medication Sig Dispense Refill  . aspirin EC 81 MG tablet Take 81 mg by mouth daily.      Marland Kitchen estradiol (CLIMARA - DOSED IN MG/24 HR) 0.1 mg/24hr patch APPLY 1 PATCH ON SKIN ONCE WEEKLY.  4 patch  5  . ibuprofen (ADVIL,MOTRIN) 200 MG tablet Take 200 mg by mouth every 8 (eight) hours as needed for pain, fever or headache.       . lisinopril-hydrochlorothiazide (PRINZIDE,ZESTORETIC) 20-12.5 MG per tablet TAKE (1) TABLET BY MOUTH DAILY FOR HIGH BLOOD PRESSURE.  30 tablet  5  . Multiple Vitamins-Minerals (CENTRUM PO) Take 1 tablet by mouth daily.        No current facility-administered medications on file prior to visit.    Review of Systems No chest pain, tightness or palpitations.  No increased shortness of breath, cough or congestion.  No nausea or vomiting. No acid reflux.  No acid reflux.  Persistent knee pain as outlined.         Objective:   Physical Exam  Filed Vitals:   03/22/14 1430  BP: 124/70  Pulse: 60  Temp: 97.9 F (36.6 C)   Blood pressure recheck:  19/3  68 year old female in no acute distress.   HEART:  Appears to be regular. LUNGS:  No crackles or wheezing audible.  Respirations even and unlabored.     EXTREMITIES:  No increased edema present.  DP pulses palpable and equal bilaterally.       MSK:  No significant pain with flexion.  Some pain to palpation over the anterior knee.  Some increased pain with weight bearing.       Assessment & Plan:  GYN.  Pelvic and pap 08/19/12.  Negative.    GI. Referred to GI.    HEALTH MAINTENANCE.  Physical 01/04/14.  Mammogram 12/01/12 - Birads II.  Schedule a f/u mammogram.  Pap (08/19/12) - negative.  Referred to GI.

## 2014-03-27 NOTE — Assessment & Plan Note (Signed)
Blood pressure better on lisinopril/hctz 20/12.5.  Continue to follow pressures.  Follow metabolic panel.

## 2014-03-28 ENCOUNTER — Telehealth: Payer: Self-pay | Admitting: Internal Medicine

## 2014-03-28 DIAGNOSIS — M25569 Pain in unspecified knee: Secondary | ICD-10-CM

## 2014-03-28 NOTE — Telephone Encounter (Signed)
Order placed for referral to rheumatology.  

## 2014-03-29 ENCOUNTER — Ambulatory Visit: Payer: Self-pay | Admitting: Internal Medicine

## 2014-03-29 ENCOUNTER — Encounter: Payer: Self-pay | Admitting: Internal Medicine

## 2014-03-29 DIAGNOSIS — Z1231 Encounter for screening mammogram for malignant neoplasm of breast: Secondary | ICD-10-CM | POA: Diagnosis not present

## 2014-03-29 LAB — HM MAMMOGRAPHY: HM Mammogram: NEGATIVE

## 2014-04-04 DIAGNOSIS — M1990 Unspecified osteoarthritis, unspecified site: Secondary | ICD-10-CM | POA: Diagnosis not present

## 2014-04-04 DIAGNOSIS — M25569 Pain in unspecified knee: Secondary | ICD-10-CM | POA: Diagnosis not present

## 2014-04-25 ENCOUNTER — Other Ambulatory Visit: Payer: Self-pay | Admitting: Internal Medicine

## 2014-05-03 DIAGNOSIS — M1990 Unspecified osteoarthritis, unspecified site: Secondary | ICD-10-CM | POA: Insufficient documentation

## 2014-05-03 DIAGNOSIS — M25569 Pain in unspecified knee: Secondary | ICD-10-CM | POA: Diagnosis not present

## 2014-05-27 ENCOUNTER — Ambulatory Visit: Payer: Medicare Other | Admitting: Internal Medicine

## 2014-07-01 ENCOUNTER — Encounter: Payer: Self-pay | Admitting: Internal Medicine

## 2014-07-01 ENCOUNTER — Ambulatory Visit (INDEPENDENT_AMBULATORY_CARE_PROVIDER_SITE_OTHER): Payer: Medicare Other | Admitting: Internal Medicine

## 2014-07-01 VITALS — BP 118/70 | HR 63 | Temp 98.2°F | Ht 64.0 in | Wt 153.0 lb

## 2014-07-01 DIAGNOSIS — Z733 Stress, not elsewhere classified: Secondary | ICD-10-CM

## 2014-07-01 DIAGNOSIS — E7800 Pure hypercholesterolemia, unspecified: Secondary | ICD-10-CM

## 2014-07-01 DIAGNOSIS — M25569 Pain in unspecified knee: Secondary | ICD-10-CM | POA: Diagnosis not present

## 2014-07-01 DIAGNOSIS — N951 Menopausal and female climacteric states: Secondary | ICD-10-CM

## 2014-07-01 DIAGNOSIS — I10 Essential (primary) hypertension: Secondary | ICD-10-CM | POA: Diagnosis not present

## 2014-07-01 DIAGNOSIS — R5381 Other malaise: Secondary | ICD-10-CM

## 2014-07-01 DIAGNOSIS — F439 Reaction to severe stress, unspecified: Secondary | ICD-10-CM

## 2014-07-01 DIAGNOSIS — M25561 Pain in right knee: Secondary | ICD-10-CM

## 2014-07-01 DIAGNOSIS — R5383 Other fatigue: Secondary | ICD-10-CM

## 2014-07-01 NOTE — Progress Notes (Signed)
Pre visit review using our clinic review tool, if applicable. No additional management support is needed unless otherwise documented below in the visit note. 

## 2014-07-03 ENCOUNTER — Encounter: Payer: Self-pay | Admitting: Internal Medicine

## 2014-07-03 DIAGNOSIS — F439 Reaction to severe stress, unspecified: Secondary | ICD-10-CM | POA: Insufficient documentation

## 2014-07-03 NOTE — Assessment & Plan Note (Signed)
History of elevated cholesterol.  Low cholesterol diet and exercise.  Desires not to take a statin.  Follow lipid profile.

## 2014-07-03 NOTE — Progress Notes (Signed)
  Subjective:    Patient ID: Kristen Strickland, female    DOB: January 05, 1946, 68 y.o.   MRN: [id number]  HPI 68 year old female with past history of hypertension and hypercholesterolemia who comes in today for a scheduled follow up.  States she has been doing relatively well.  Stays active.  No cardiac symptoms with increased activity or exertion.  Breathing stable.  No acid reflux.  Bowels stable.  She is back on estrogen patch.  Her insurance is not covering her estradiol patch. She tried premarin.  Did not feel as good.  Back on estrogen patch now.  Feels better on the patch.  No hot flashes.  S/p injection in her knee.  Knee pain is better.  Increased stress.  Mother recently passed away unexpectedly.  She is having to take care of her father with alzheimers.  Overall feels she is handling stress relatively well.         Past Medical History  Diagnosis Date  . Hypercholesterolemia   . Hypertension   . Arthritis     Current Outpatient Prescriptions on File Prior to Visit  Medication Sig Dispense Refill  . aspirin EC 81 MG tablet Take 81 mg by mouth daily.      Marland Kitchen estradiol (CLIMARA - DOSED IN MG/24 HR) 0.1 mg/24hr patch APPLY 1 PATCH ON SKIN ONCE WEEKLY.  4 patch  5  . ibuprofen (ADVIL,MOTRIN) 200 MG tablet Take 200 mg by mouth every 8 (eight) hours as needed for pain, fever or headache.       . lisinopril-hydrochlorothiazide (PRINZIDE,ZESTORETIC) 20-12.5 MG per tablet TAKE (1) TABLET BY MOUTH DAILY FOR HIGH BLOOD PRESSURE.  30 tablet  5  . Multiple Vitamins-Minerals (CENTRUM PO) Take 1 tablet by mouth daily.        No current facility-administered medications on file prior to visit.    Review of Systems No headache reported.  No vision change.  No significant sinus symptoms.  No chest pain, tightness or palpitations.  No increased shortness of breath, cough or congestion.  No nausea or vomiting. No acid reflux.  No abdominal pain or cramping.  No bowel change, such as diarrhea, constipation,  BRBPR or melana.  Blood pressure doing better.  Back on her estrogen patch.  Feels better on the patch.  Knee pain is better.  Increased stress as outlined.        Objective:   Physical Exam  Filed Vitals:   07/01/14 1531  BP: 118/70  Pulse: 63  Temp: 98.2 F (36.8 C)   Blood pressure recheck prior to leaving:  21/78  68 year old female in no acute distress.   HEENT:  Nares- clear.  Oropharynx - without lesions. NECK:  Supple.  Nontender.  No audible bruit.  HEART:  Appears to be regular. LUNGS:  No crackles or wheezing audible.  Respirations even and unlabored.  RADIAL PULSE:  Equal bilaterally.   ABDOMEN:  Soft, nontender.  Bowel sounds present and normal.  No audible abdominal bruit.     EXTREMITIES:  No increased edema present.  DP pulses palpable and equal bilaterally.          Assessment & Plan:  GYN.  Pelvic and pap 08/19/12.  Negative.    GI. Referred to GI last visit.    HEALTH MAINTENANCE.  Physical 01/04/14.  Mammogram 03/29/14 - Birads I.   Pap (08/19/12) - negative.

## 2014-07-03 NOTE — Assessment & Plan Note (Signed)
Increased stress as outlined.  Feels she is handling things relatively well.  Follow.   

## 2014-07-03 NOTE — Assessment & Plan Note (Signed)
Better on estrogen patch.  Follow.

## 2014-07-03 NOTE — Assessment & Plan Note (Signed)
Better s/p injection.  Follow.

## 2014-07-03 NOTE — Assessment & Plan Note (Signed)
Blood pressure better on lisinopril/hctz 20/12.5.  Continue to follow pressures.  Follow metabolic panel.

## 2014-07-25 ENCOUNTER — Other Ambulatory Visit: Payer: Self-pay | Admitting: Internal Medicine

## 2014-07-28 ENCOUNTER — Other Ambulatory Visit (INDEPENDENT_AMBULATORY_CARE_PROVIDER_SITE_OTHER): Payer: Medicare Other

## 2014-07-28 DIAGNOSIS — I10 Essential (primary) hypertension: Secondary | ICD-10-CM

## 2014-07-28 DIAGNOSIS — E7800 Pure hypercholesterolemia, unspecified: Secondary | ICD-10-CM

## 2014-07-28 DIAGNOSIS — R5383 Other fatigue: Secondary | ICD-10-CM

## 2014-07-28 DIAGNOSIS — R5381 Other malaise: Secondary | ICD-10-CM

## 2014-07-28 LAB — LIPID PANEL
Cholesterol: 226 mg/dL — ABNORMAL HIGH (ref 0–200)
HDL: 43.5 mg/dL (ref >39.00)
LDL Cholesterol: 158 mg/dL — ABNORMAL HIGH (ref 0–99)
NonHDL: 182.5
Total CHOL/HDL Ratio: 5
Triglycerides: 125 mg/dL (ref 0.0–149.0)
VLDL: 25 mg/dL (ref 0.0–40.0)

## 2014-07-28 LAB — COMPREHENSIVE METABOLIC PANEL
ALT: 22 U/L (ref 0–35)
AST: 20 U/L (ref 0–37)
Albumin: 3.4 g/dL — ABNORMAL LOW (ref 3.5–5.2)
Alkaline Phosphatase: 61 U/L (ref 39–117)
BUN: 10 mg/dL (ref 6–23)
CO2: 28 mEq/L (ref 19–32)
Calcium: 9.3 mg/dL (ref 8.4–10.5)
Chloride: 103 mEq/L (ref 96–112)
Creatinine, Ser: 0.6 mg/dL (ref 0.4–1.2)
GFR: 117.09 mL/min (ref >60.00)
Glucose, Bld: 88 mg/dL (ref 70–99)
Potassium: 4.4 mEq/L (ref 3.5–5.1)
Sodium: 139 mEq/L (ref 135–145)
Total Bilirubin: 0.8 mg/dL (ref 0.2–1.2)
Total Protein: 7.1 g/dL (ref 6.0–8.3)

## 2014-07-28 LAB — HEPATIC FUNCTION PANEL
ALT: 22 U/L (ref 0–35)
AST: 20 U/L (ref 0–37)
Albumin: 3.4 g/dL — ABNORMAL LOW (ref 3.5–5.2)
Alkaline Phosphatase: 61 U/L (ref 39–117)
Bilirubin, Direct: 0.1 mg/dL (ref 0.0–0.3)
Total Bilirubin: 0.8 mg/dL (ref 0.2–1.2)
Total Protein: 7.1 g/dL (ref 6.0–8.3)

## 2014-07-28 LAB — TSH: TSH: 1.74 u[IU]/mL (ref 0.35–4.50)

## 2014-08-01 ENCOUNTER — Encounter: Payer: Self-pay | Admitting: *Deleted

## 2014-09-19 ENCOUNTER — Other Ambulatory Visit: Payer: Self-pay | Admitting: *Deleted

## 2014-09-19 MED ORDER — LISINOPRIL-HYDROCHLOROTHIAZIDE 20-12.5 MG PO TABS: ORAL_TABLET | ORAL | Status: DC

## 2014-10-05 ENCOUNTER — Encounter: Payer: Self-pay | Admitting: Internal Medicine

## 2014-10-05 ENCOUNTER — Ambulatory Visit (INDEPENDENT_AMBULATORY_CARE_PROVIDER_SITE_OTHER): Payer: Medicare Other | Admitting: Internal Medicine

## 2014-10-05 ENCOUNTER — Encounter (INDEPENDENT_AMBULATORY_CARE_PROVIDER_SITE_OTHER): Payer: Self-pay

## 2014-10-05 VITALS — BP 122/80 | HR 60 | Temp 97.7°F | Ht 64.0 in | Wt 154.8 lb

## 2014-10-05 DIAGNOSIS — I10 Essential (primary) hypertension: Secondary | ICD-10-CM

## 2014-10-05 DIAGNOSIS — Z658 Other specified problems related to psychosocial circumstances: Secondary | ICD-10-CM

## 2014-10-05 DIAGNOSIS — E7800 Pure hypercholesterolemia, unspecified: Secondary | ICD-10-CM

## 2014-10-05 DIAGNOSIS — F439 Reaction to severe stress, unspecified: Secondary | ICD-10-CM

## 2014-10-05 DIAGNOSIS — R5383 Other fatigue: Secondary | ICD-10-CM | POA: Diagnosis not present

## 2014-10-05 MED ORDER — LISINOPRIL-HYDROCHLOROTHIAZIDE 20-12.5 MG PO TABS: ORAL_TABLET | ORAL | Status: DC

## 2014-10-05 NOTE — Progress Notes (Signed)
Subjective:    Patient ID: Kristen Strickland, female    DOB: September 09, 1946, 68 y.o.   MRN: [id number]  HPI 68 year old female with past history of hypertension and hypercholesterolemia who comes in today for a scheduled follow up.  States she has been doing relatively well.  Stays active.  No cardiac symptoms with increased activity or exertion.  Breathing stable.  No acid reflux.  Bowels stable.   S/p injection in her knee.  Knee pain is better.  Increased stress.  Mother recently passed away unexpectedly.  She is having to take care of her father with alzheimers.  Overall feels she is handling stress relatively well.         Past Medical History  Diagnosis Date  . Hypercholesterolemia   . Hypertension   . Arthritis     Current Outpatient Prescriptions on File Prior to Visit  Medication Sig Dispense Refill  . aspirin EC 81 MG tablet Take 81 mg by mouth daily.    Marland Kitchen estradiol (CLIMARA - DOSED IN MG/24 HR) 0.1 mg/24hr patch APPLY 1 PATCH ON SKIN ONCE WEEKLY. 4 patch 5  . ibuprofen (ADVIL,MOTRIN) 200 MG tablet Take 200 mg by mouth every 8 (eight) hours as needed for pain, fever or headache.     . lisinopril-hydrochlorothiazide (PRINZIDE,ZESTORETIC) 20-12.5 MG per tablet TAKE (1) TABLET BY MOUTH DAILY FOR HIGH BLOOD PRESSURE. 30 tablet 5  . Multiple Vitamins-Minerals (CENTRUM PO) Take 1 tablet by mouth daily.      No current facility-administered medications on file prior to visit.    Review of Systems No headache reported.  No vision change.  No significant sinus symptoms.  No chest pain, tightness or palpitations.  No increased shortness of breath, cough or congestion.  No nausea or vomiting. No acid reflux.  No abdominal pain or cramping.  No bowel change, such as diarrhea, constipation, BRBPR or melana.  Blood pressure doing better.  Feels she is handling stress well.         Objective:   Physical Exam  Filed Vitals:   10/05/14 0925  BP: 122/80  Pulse: 60  Temp: 97.7 F (36.5 C)    Blood pressure recheck: 51/96  68 year old female in no acute distress.   HEENT:  Nares- clear.  Oropharynx - without lesions. NECK:  Supple.  Nontender.  No audible bruit.  HEART:  Appears to be regular. LUNGS:  No crackles or wheezing audible.  Respirations even and unlabored.  RADIAL PULSE:  Equal bilaterally.   ABDOMEN:  Soft, nontender.  Bowel sounds present and normal.  No audible abdominal bruit.     EXTREMITIES:  No increased edema present.  DP pulses palpable and equal bilaterally.          Assessment & Plan:  1. Essential hypertension Blood pressure overall better.  Follow.  Same medication regimen.  Follow met b.    2. Hypercholesteremia Low cholesterol diet and exercise.  Follow lipid panel.  Declines cholesterol medication.   Lab Results  Component Value Date   CHOL 226* 07/28/2014   HDL 43.50 07/28/2014   LDLCALC 158* 07/28/2014   LDLDIRECT 151.7 08/16/2013   TRIG 125.0 07/28/2014   CHOLHDL 5 07/28/2014   3. Stress Feels she is handling stress relatively well.  Follow.   4. GYN.  Pelvic and pap 08/19/12.  Negative.    5. GI. Referred to GI last visit.    HEALTH MAINTENANCE.  Physical 01/04/14.  Mammogram 03/29/14 - Birads I.  Pap (08/19/12) - negative.

## 2014-10-05 NOTE — Progress Notes (Signed)
Pre visit review using our clinic review tool, if applicable. No additional management support is needed unless otherwise documented below in the visit note. 

## 2014-10-09 ENCOUNTER — Encounter: Payer: Self-pay | Admitting: Internal Medicine

## 2015-01-10 ENCOUNTER — Other Ambulatory Visit: Payer: Self-pay | Admitting: Internal Medicine

## 2015-01-16 IMAGING — CR DG KNEE 1-2V*R*
3 series · 3 of 3 positions shown · non-contrast
Comparison: None.

CLINICAL DATA: Right knee pain

EXAM:
RIGHT KNEE - 1-2 VIEW

[view not recorded (1 of 3)]
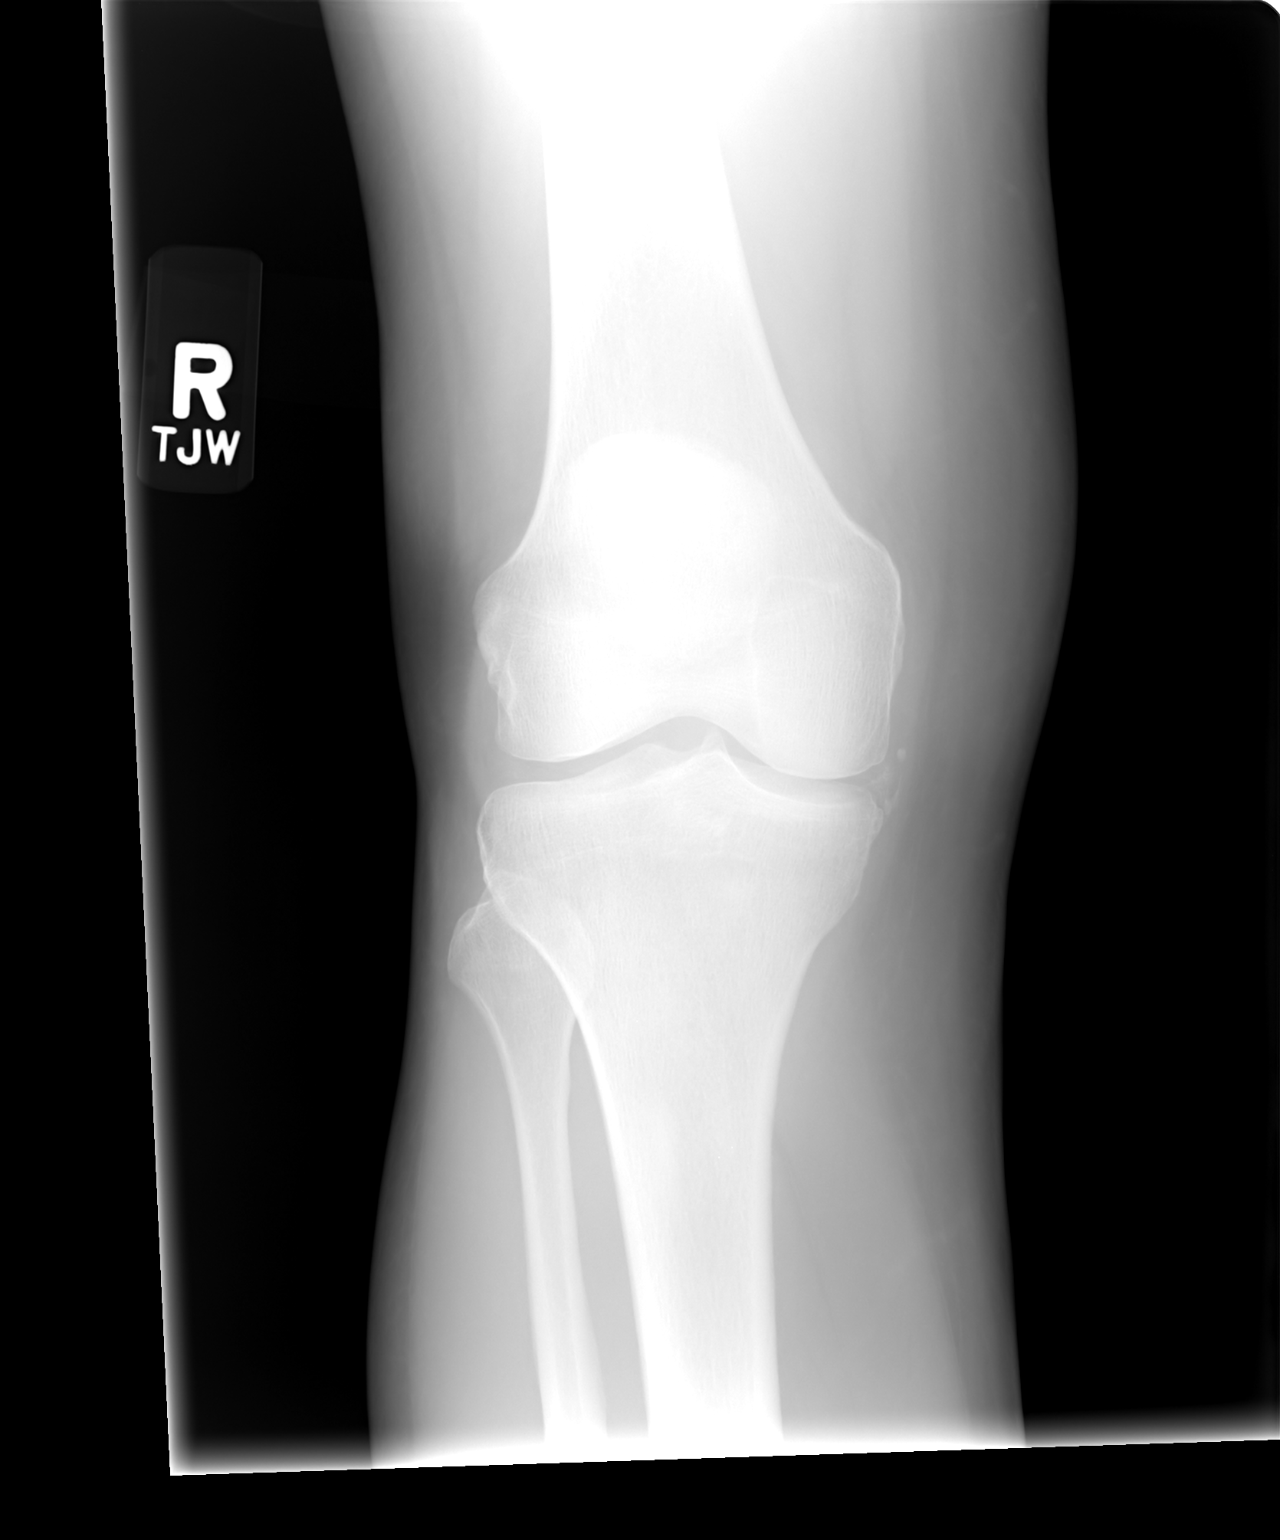

[view not recorded (2 of 3)]
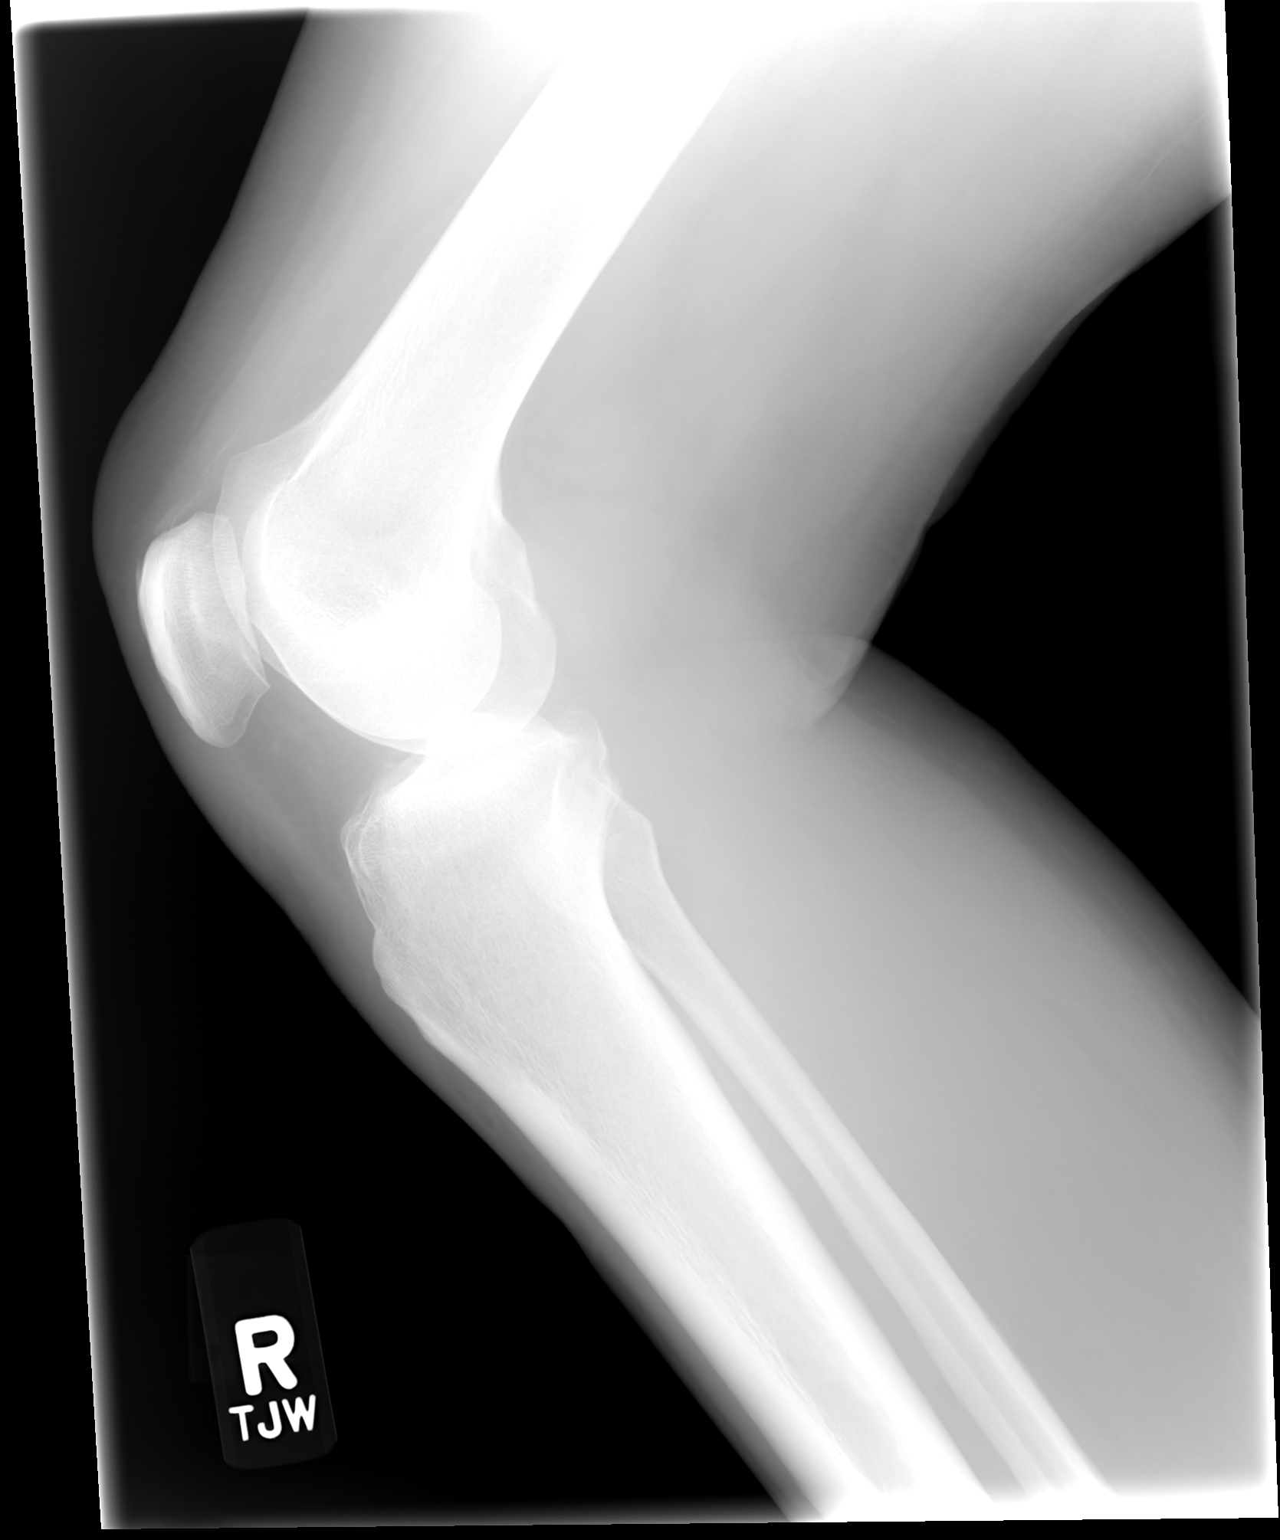

[view not recorded (3 of 3)]
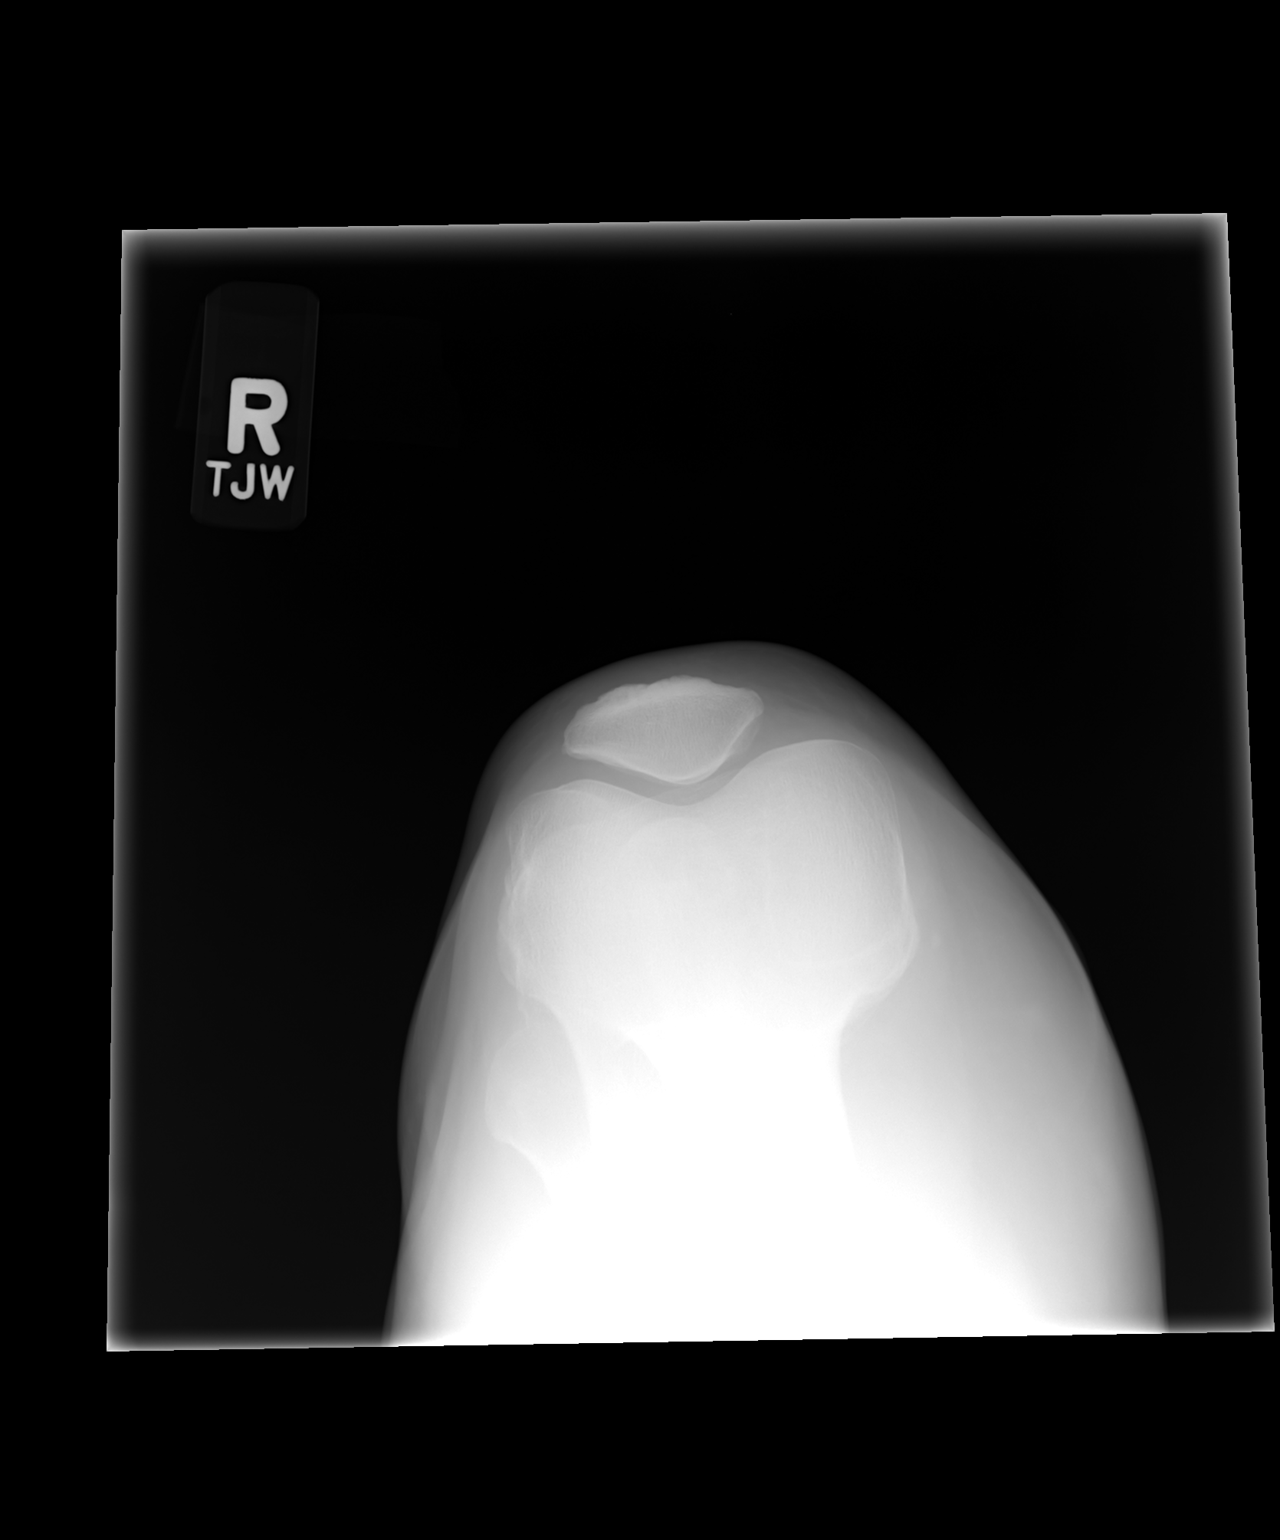

[3 of 3 positions shown; findings below may reference images not displayed]

FINDINGS: No acute fracture is seen. There does appear to be faint
chondrocalcinosis medially and to a lesser degree laterally which
may indicate CPPD arthropathy. Clinical correlation is recommended.
No knee joint effusion is noted. The patella appears normally
positioned.
IMPRESSION: 1. No fracture or effusion.
2. Apparent faint chondrocalcinosis.  Question CPPD arthropathy.

## 2015-02-03 ENCOUNTER — Other Ambulatory Visit (INDEPENDENT_AMBULATORY_CARE_PROVIDER_SITE_OTHER): Payer: Medicare Other

## 2015-02-03 DIAGNOSIS — I10 Essential (primary) hypertension: Secondary | ICD-10-CM | POA: Diagnosis not present

## 2015-02-03 DIAGNOSIS — R5383 Other fatigue: Secondary | ICD-10-CM

## 2015-02-03 DIAGNOSIS — E7800 Pure hypercholesterolemia, unspecified: Secondary | ICD-10-CM

## 2015-02-03 LAB — COMPREHENSIVE METABOLIC PANEL
ALT: 22 U/L (ref 0–35)
AST: 20 U/L (ref 0–37)
Albumin: 3.8 g/dL (ref 3.5–5.2)
Alkaline Phosphatase: 58 U/L (ref 39–117)
BUN: 14 mg/dL (ref 6–23)
CO2: 28 mEq/L (ref 19–32)
Calcium: 9.3 mg/dL (ref 8.4–10.5)
Chloride: 102 mEq/L (ref 96–112)
Creatinine, Ser: 0.61 mg/dL (ref 0.40–1.20)
GFR: 103.74 mL/min (ref >60.00)
Glucose, Bld: 93 mg/dL (ref 70–99)
Potassium: 4.1 mEq/L (ref 3.5–5.1)
Sodium: 136 mEq/L (ref 135–145)
Total Bilirubin: 0.8 mg/dL (ref 0.2–1.2)
Total Protein: 6.5 g/dL (ref 6.0–8.3)

## 2015-02-03 LAB — CBC WITH DIFFERENTIAL/PLATELET
Basophils Absolute: 0 10*3/uL (ref 0.0–0.1)
Basophils Relative: 0.8 % (ref 0.0–3.0)
Eosinophils Absolute: 0.1 10*3/uL (ref 0.0–0.7)
Eosinophils Relative: 1.8 % (ref 0.0–5.0)
HCT: 38.4 % (ref 36.0–46.0)
Hemoglobin: 13.1 g/dL (ref 12.0–15.0)
Lymphocytes Relative: 23.8 % (ref 12.0–46.0)
Lymphs Abs: 1.3 10*3/uL (ref 0.7–4.0)
MCHC: 34.2 g/dL (ref 30.0–36.0)
MCV: 88.3 fl (ref 78.0–100.0)
Monocytes Absolute: 0.4 10*3/uL (ref 0.1–1.0)
Monocytes Relative: 7.1 % (ref 3.0–12.0)
Neutro Abs: 3.5 10*3/uL (ref 1.4–7.7)
Neutrophils Relative %: 66.5 % (ref 43.0–77.0)
Platelets: 220 10*3/uL (ref 150.0–400.0)
RBC: 4.35 Mil/uL (ref 3.87–5.11)
RDW: 13.1 % (ref 11.5–15.5)
WBC: 5.3 10*3/uL (ref 4.0–10.5)

## 2015-02-03 LAB — LIPID PANEL
Cholesterol: 212 mg/dL — ABNORMAL HIGH (ref 0–200)
HDL: 48.5 mg/dL (ref >39.00)
LDL Cholesterol: 129 mg/dL — ABNORMAL HIGH (ref 0–99)
NonHDL: 163.5
Total CHOL/HDL Ratio: 4
Triglycerides: 171 mg/dL — ABNORMAL HIGH (ref 0.0–149.0)
VLDL: 34.2 mg/dL (ref 0.0–40.0)

## 2015-02-08 ENCOUNTER — Ambulatory Visit (INDEPENDENT_AMBULATORY_CARE_PROVIDER_SITE_OTHER): Payer: Medicare Other | Admitting: Internal Medicine

## 2015-02-08 ENCOUNTER — Encounter: Payer: Self-pay | Admitting: Internal Medicine

## 2015-02-08 VITALS — BP 122/78 | HR 61 | Temp 98.1°F | Ht 64.0 in | Wt 158.1 lb

## 2015-02-08 DIAGNOSIS — E7800 Pure hypercholesterolemia, unspecified: Secondary | ICD-10-CM

## 2015-02-08 DIAGNOSIS — Z0000 Encounter for general adult medical examination without abnormal findings: Secondary | ICD-10-CM

## 2015-02-08 DIAGNOSIS — R5383 Other fatigue: Secondary | ICD-10-CM

## 2015-02-08 DIAGNOSIS — Z658 Other specified problems related to psychosocial circumstances: Secondary | ICD-10-CM

## 2015-02-08 DIAGNOSIS — I10 Essential (primary) hypertension: Secondary | ICD-10-CM | POA: Diagnosis not present

## 2015-02-08 DIAGNOSIS — L989 Disorder of the skin and subcutaneous tissue, unspecified: Secondary | ICD-10-CM | POA: Insufficient documentation

## 2015-02-08 DIAGNOSIS — F439 Reaction to severe stress, unspecified: Secondary | ICD-10-CM

## 2015-02-08 NOTE — Assessment & Plan Note (Signed)
Abdominal wall lesion.  Persistent.  Have dermatology evaluate.

## 2015-02-08 NOTE — Assessment & Plan Note (Signed)
Physical today.  Mammogram 03/29/14 - Birads I.  Declines mammogram this year.  States recently had colonoscopy.  Need results.

## 2015-02-08 NOTE — Assessment & Plan Note (Signed)
Low cholesterol diet and exercise.  LDL improved.  Follow lipid panel.

## 2015-02-08 NOTE — Assessment & Plan Note (Signed)
Increased stress.  Feels doing well.  Follow.

## 2015-02-08 NOTE — Assessment & Plan Note (Signed)
Blood pressure as outlined.  Same medication regimen.  Follow pressures.  Follow metabolic panel.  

## 2015-02-08 NOTE — Progress Notes (Signed)
Pre visit review using our clinic review tool, if applicable. No additional management support is needed unless otherwise documented below in the visit note. 

## 2015-02-08 NOTE — Progress Notes (Signed)
Patient ID: Kristen Strickland, female   DOB: [birth year], 69 y.o.   MRN: [id number]   Subjective:    Patient ID: Kristen Strickland, female    DOB: 09/15/1946, 69 y.o.   MRN: [id number]  HPI  Patient here for her physical exam.  Stays active.  No cardiac symptoms with increased activity or exertion.  Breathing stable.  No acid reflux reported.  Eating and drinking well.  Bowels stable.  Persistent upper abdominal wall lesion.  No itching or pain.     Past Medical History  Diagnosis Date  . Hypercholesterolemia   . Hypertension   . Arthritis     Current Outpatient Prescriptions on File Prior to Visit  Medication Sig Dispense Refill  . aspirin EC 81 MG tablet Take 81 mg by mouth daily.    Marland Kitchen estradiol (CLIMARA - DOSED IN MG/24 HR) 0.1 mg/24hr patch APPLY 1 PATCH ON SKIN ONCE WEEKLY. 4 patch 11  . ibuprofen (ADVIL,MOTRIN) 200 MG tablet Take 200 mg by mouth every 8 (eight) hours as needed for pain, fever or headache.     . lisinopril-hydrochlorothiazide (PRINZIDE,ZESTORETIC) 20-12.5 MG per tablet TAKE (1) TABLET BY MOUTH DAILY FOR HIGH BLOOD PRESSURE. 90 tablet 1  . Multiple Vitamins-Minerals (CENTRUM PO) Take 1 tablet by mouth daily.      No current facility-administered medications on file prior to visit.    Review of Systems  Constitutional: Negative for appetite change and unexpected weight change.  HENT: Negative for congestion and sinus pressure.   Eyes: Negative for pain and visual disturbance.  Respiratory: Negative for cough, chest tightness and shortness of breath.   Cardiovascular: Negative for chest pain, palpitations and leg swelling.  Gastrointestinal: Negative for nausea, vomiting, abdominal pain and diarrhea.  Genitourinary: Negative for frequency and difficulty urinating.  Musculoskeletal:       Some stiffness in her hands.    Skin: Negative for color change and rash.  Neurological: Negative for dizziness, light-headedness and headaches.  Hematological: Negative for  adenopathy. Does not bruise/bleed easily.  Psychiatric/Behavioral: Negative for dysphoric mood and agitation.       Objective:     Blood pressure recheck:  132/78  Physical Exam  Constitutional: She is oriented to person, place, and time. She appears well-developed and well-nourished.  HENT:  Nose: Nose normal.  Mouth/Throat: Oropharynx is clear and moist.  Eyes: Right eye exhibits no discharge. Left eye exhibits no discharge. No scleral icterus.  Neck: Neck supple. No thyromegaly present.  Cardiovascular: Normal rate and regular rhythm.   Pulmonary/Chest: Breath sounds normal. No accessory muscle usage. No tachypnea. No respiratory distress. She has no decreased breath sounds. She has no wheezes. She has no rhonchi. Right breast exhibits no inverted nipple, no mass, no nipple discharge and no tenderness (no axillary adenopathy). Left breast exhibits no inverted nipple, no mass, no nipple discharge and no tenderness (no axilarry adenopathy).  Abdominal: Soft. Bowel sounds are normal. There is no tenderness.  Musculoskeletal: She exhibits no edema or tenderness.  Lymphadenopathy:    She has no cervical adenopathy.  Neurological: She is alert and oriented to person, place, and time.  Skin: Skin is warm. No rash noted.  Psychiatric: She has a normal mood and affect. Her behavior is normal.    BP 122/78 mmHg  Pulse 61  Temp(Src) 98.1 F (36.7 C) (Oral)  Ht 5\' 4"  (1.626 m)  Wt 158 lb 2 oz (71.725 kg)  BMI 27.13 kg/m2  SpO2 96% Wt Readings from Last 3  Encounters:  02/08/15 158 lb 2 oz (71.725 kg)  10/05/14 154 lb 12 oz (70.194 kg)  07/01/14 153 lb (69.4 kg)     Lab Results  Component Value Date   WBC 5.3 02/03/2015   HGB 13.1 02/03/2015   HCT 38.4 02/03/2015   PLT 220.0 02/03/2015   GLUCOSE 93 02/03/2015   CHOL 212* 02/03/2015   TRIG 171.0* 02/03/2015   HDL 48.50 02/03/2015   LDLDIRECT 151.7 08/16/2013   LDLCALC 129* 02/03/2015   ALT 22 02/03/2015   AST 20 02/03/2015     NA 136 02/03/2015   K 4.1 02/03/2015   CL 102 02/03/2015   CREATININE 0.61 02/03/2015   BUN 14 02/03/2015   CO2 28 02/03/2015   TSH 1.74 07/28/2014       Assessment & Plan:   Problem List Items Addressed This Visit    Health care maintenance    Physical today.  Mammogram 03/29/14 - Birads I.  Declines mammogram this year.  States recently had colonoscopy.  Need results.        Hypercholesteremia    Low cholesterol diet and exercise.  LDL improved.  Follow lipid panel.       Relevant Orders   Lipid panel   Hypertension - Primary    Blood pressure as outlined.  Same medication regimen.  Follow pressures.  Follow metabolic panel.        Relevant Orders   Comprehensive metabolic panel   Skin lesion    Abdominal wall lesion.  Persistent.  Have dermatology evaluate.        Stress    Increased stress.  Feels doing well.  Follow.         Other Visit Diagnoses    Other fatigue        Relevant Orders    TSH      I spent 25 minutes with the patient and more than 50% of the time was spent in consultation regarding the above.     Einar Pheasant, MD

## 2015-05-25 ENCOUNTER — Other Ambulatory Visit: Payer: Self-pay

## 2015-05-25 DIAGNOSIS — I10 Essential (primary) hypertension: Secondary | ICD-10-CM

## 2015-05-25 MED ORDER — LISINOPRIL-HYDROCHLOROTHIAZIDE 20-12.5 MG PO TABS: ORAL_TABLET | ORAL | Status: DC

## 2015-07-12 ENCOUNTER — Telehealth: Payer: Self-pay | Admitting: Internal Medicine

## 2015-07-12 NOTE — Telephone Encounter (Signed)
Cloe sister in law Santiago Glad [phone] or home [phone] called and states that she can become a Pt of Dr Nicki Reaper. I wanted to check if that's ok? Let me know. Thank You!

## 2015-07-12 NOTE — Telephone Encounter (Signed)
Ok to schedule.  Need to let them know may be several months before appt.  Thanks

## 2015-08-08 ENCOUNTER — Other Ambulatory Visit (INDEPENDENT_AMBULATORY_CARE_PROVIDER_SITE_OTHER): Payer: Medicare Other

## 2015-08-08 DIAGNOSIS — I10 Essential (primary) hypertension: Secondary | ICD-10-CM

## 2015-08-08 DIAGNOSIS — E7800 Pure hypercholesterolemia, unspecified: Secondary | ICD-10-CM | POA: Diagnosis not present

## 2015-08-08 DIAGNOSIS — R5383 Other fatigue: Secondary | ICD-10-CM | POA: Diagnosis not present

## 2015-08-08 LAB — COMPREHENSIVE METABOLIC PANEL
ALT: 23 U/L (ref 0–35)
AST: 16 U/L (ref 0–37)
Albumin: 4.2 g/dL (ref 3.5–5.2)
Alkaline Phosphatase: 65 U/L (ref 39–117)
BUN: 14 mg/dL (ref 6–23)
CO2: 28 mEq/L (ref 19–32)
Calcium: 9.5 mg/dL (ref 8.4–10.5)
Chloride: 104 mEq/L (ref 96–112)
Creatinine, Ser: 0.62 mg/dL (ref 0.40–1.20)
GFR: 101.65 mL/min (ref >60.00)
Glucose, Bld: 93 mg/dL (ref 70–99)
Potassium: 4.2 mEq/L (ref 3.5–5.1)
Sodium: 139 mEq/L (ref 135–145)
Total Bilirubin: 0.9 mg/dL (ref 0.2–1.2)
Total Protein: 6.9 g/dL (ref 6.0–8.3)

## 2015-08-08 LAB — LIPID PANEL
Cholesterol: 241 mg/dL — ABNORMAL HIGH (ref 0–200)
HDL: 56.8 mg/dL (ref >39.00)
LDL Cholesterol: 149 mg/dL — ABNORMAL HIGH (ref 0–99)
NonHDL: 183.73
Total CHOL/HDL Ratio: 4
Triglycerides: 174 mg/dL — ABNORMAL HIGH (ref 0.0–149.0)
VLDL: 34.8 mg/dL (ref 0.0–40.0)

## 2015-08-08 LAB — TSH: TSH: 3.25 u[IU]/mL (ref 0.35–4.50)

## 2015-08-10 ENCOUNTER — Ambulatory Visit (INDEPENDENT_AMBULATORY_CARE_PROVIDER_SITE_OTHER): Payer: Medicare Other | Admitting: Internal Medicine

## 2015-08-10 ENCOUNTER — Encounter: Payer: Self-pay | Admitting: Internal Medicine

## 2015-08-10 VITALS — BP 122/80 | HR 62 | Temp 97.7°F | Resp 18 | Ht 64.0 in | Wt 157.0 lb

## 2015-08-10 DIAGNOSIS — Z658 Other specified problems related to psychosocial circumstances: Secondary | ICD-10-CM | POA: Diagnosis not present

## 2015-08-10 DIAGNOSIS — I10 Essential (primary) hypertension: Secondary | ICD-10-CM

## 2015-08-10 DIAGNOSIS — M545 Low back pain, unspecified: Secondary | ICD-10-CM

## 2015-08-10 DIAGNOSIS — E7800 Pure hypercholesterolemia, unspecified: Secondary | ICD-10-CM

## 2015-08-10 DIAGNOSIS — F439 Reaction to severe stress, unspecified: Secondary | ICD-10-CM

## 2015-08-10 DIAGNOSIS — L989 Disorder of the skin and subcutaneous tissue, unspecified: Secondary | ICD-10-CM | POA: Diagnosis not present

## 2015-08-10 MED ORDER — LOSARTAN POTASSIUM 50 MG PO TABS: 50.0000 mg | ORAL_TABLET | Freq: Every day | ORAL | Status: DC

## 2015-08-10 MED ORDER — TRAMADOL HCL 50 MG PO TABS: 50.0000 mg | ORAL_TABLET | Freq: Two times a day (BID) | ORAL | Status: DC | PRN

## 2015-08-10 NOTE — Progress Notes (Signed)
Patient ID: Kristen Strickland, female   DOB: [birth year], 69 y.o.   MRN: [id number]   Subjective:    Patient ID: Kristen Strickland, female    DOB: 01-16-1946, 69 y.o.   MRN: [id number]  HPI  Patient with past history of hypertension, increased stress and hypercholesterolemia who comes in today to follow up on these issues.  She has stopped lisinopril/hctz.  Stopped secondary to cough. Cough resolved with stopping the medication.  Has not been checking her blood pressure.  Stays active.  No cardiac symptoms with increased activity or exertion.  No sob.  No acid reflux.  No abdominal pain or cramping.  Bowels stable.  She is pain in her back and hips.  Hurts when she lies down.  Has been taking ibuprofen bid.  Wants something stronger to have if needed.  Tylenol does not help.  She has a persistent skin lesion on her right upper abdomen.  Have dermatology evaluate.     Past Medical History  Diagnosis Date  . Hypercholesterolemia   . Hypertension   . Arthritis    Past Surgical History  Procedure Laterality Date  . Abdominal hysterectomy  1975  . Back surgery      ruptured disc  . Reconstruction of nose    . Anterior cervical decomp/discectomy fusion N/A 04/09/2013    Procedure: ANTERIOR CERVICAL DECOMPRESSION/DISCECTOMY FUSION 2 LEVELS;  Surgeon: Floyce Stakes, MD;  Location: [facility];  Service: Neurosurgery;  Laterality: N/A;  Cervical five-six Cervical six-seven  Anterior cervical decompression/diskectomy/fusion   Family History  Problem Relation Age of Onset  . Cancer Mother     ovarian/uterine  . Heart disease Mother   . Colon cancer Father    Social History   Social History  . Marital Status: Married    Spouse Name: N/A  . Number of Children: 2  . Years of Education: N/A   Social History Main Topics  . Smoking status: Never Smoker   . Smokeless tobacco: Never Used  . Alcohol Use: 0.0 oz/week    0 Standard drinks or equivalent per week     Comment: rarely  . Drug Use: No    . Sexual Activity: Yes   Other Topics Concern  . None   Social History Narrative    Outpatient Encounter Prescriptions as of 08/10/2015  Medication Sig  . aspirin EC 81 MG tablet Take 81 mg by mouth daily.  Marland Kitchen estradiol (CLIMARA - DOSED IN MG/24 HR) 0.1 mg/24hr patch APPLY 1 PATCH ON SKIN ONCE WEEKLY.  Marland Kitchen ibuprofen (ADVIL,MOTRIN) 200 MG tablet Take 200 mg by mouth every 8 (eight) hours as needed for pain, fever or headache.   . Multiple Vitamins-Minerals (CENTRUM PO) Take 1 tablet by mouth daily.   Marland Kitchen losartan (COZAAR) 50 MG tablet Take 1 tablet (50 mg total) by mouth daily.  . traMADol (ULTRAM) 50 MG tablet Take 1 tablet (50 mg total) by mouth 2 (two) times daily as needed.  . [DISCONTINUED] lisinopril-hydrochlorothiazide (PRINZIDE,ZESTORETIC) 20-12.5 MG per tablet TAKE (1) TABLET BY MOUTH DAILY FOR HIGH BLOOD PRESSURE. (Patient not taking: Reported on 08/10/2015)   No facility-administered encounter medications on file as of 08/10/2015.    Review of Systems  Constitutional: Negative for appetite change and unexpected weight change.  HENT: Negative for congestion and sinus pressure.   Eyes: Negative for discharge and visual disturbance.  Respiratory: Negative for cough, chest tightness and shortness of breath.   Cardiovascular: Negative for chest pain, palpitations and leg swelling.  Gastrointestinal: Negative for nausea, vomiting, abdominal pain and diarrhea.  Genitourinary: Negative for dysuria and difficulty urinating.  Musculoskeletal: Positive for back pain.       Pain in her hips and back.  Hurts more when lying down.  Improves with movement.    Skin: Negative for color change and rash.  Neurological: Negative for dizziness, light-headedness and headaches.  Psychiatric/Behavioral: Negative for dysphoric mood and agitation.       Feels she is handling stress relatively well.         Objective:     Blood pressure rechecked by me:  154/84  Physical Exam  Constitutional:  She appears well-developed and well-nourished. No distress.  HENT:  Nose: Nose normal.  Mouth/Throat: Oropharynx is clear and moist.  Eyes: Conjunctivae are normal. Right eye exhibits no discharge. Left eye exhibits no discharge.  Neck: Neck supple. No thyromegaly present.  Cardiovascular: Normal rate and regular rhythm.   Pulmonary/Chest: Breath sounds normal. No respiratory distress. She has no wheezes.  Abdominal: Soft. Bowel sounds are normal. There is no tenderness.  Musculoskeletal: She exhibits no edema or tenderness.  No pain with straight leg raise.  Motor strength equal bilateral lower extremities.    Lymphadenopathy:    She has no cervical adenopathy.  Skin: No rash noted. No erythema.  Psychiatric: She has a normal mood and affect. Her behavior is normal.    BP 122/80 mmHg  Pulse 62  Temp(Src) 97.7 F (36.5 C) (Oral)  Resp 18  Ht 5\' 4"  (1.626 m)  Wt 157 lb (71.215 kg)  BMI 26.94 kg/m2  SpO2 96% Wt Readings from Last 3 Encounters:  08/10/15 157 lb (71.215 kg)  02/08/15 158 lb 2 oz (71.725 kg)  10/05/14 154 lb 12 oz (70.194 kg)     Lab Results  Component Value Date   WBC 5.3 02/03/2015   HGB 13.1 02/03/2015   HCT 38.4 02/03/2015   PLT 220.0 02/03/2015   GLUCOSE 93 08/08/2015   CHOL 241* 08/08/2015   TRIG 174.0* 08/08/2015   HDL 56.80 08/08/2015   LDLDIRECT 151.7 08/16/2013   LDLCALC 149* 08/08/2015   ALT 23 08/08/2015   AST 16 08/08/2015   NA 139 08/08/2015   K 4.2 08/08/2015   CL 104 08/08/2015   CREATININE 0.62 08/08/2015   BUN 14 08/08/2015   CO2 28 08/08/2015   TSH 3.25 08/08/2015       Assessment & Plan:   Problem List Items Addressed This Visit    Back pain    Pain in her back and hips as outlined.  Discussed further evaluation.  She declines.  Declines xray.  Discussed stretches.  Will follow.  Tramadol given for pain.        Relevant Medications   traMADol (ULTRAM) 50 MG tablet   Hypercholesteremia    Low cholesterol diet and  exercise.  LDL increased to 149.  Discussed cholesterol medication.  She declines.  Follow lipid panel.        Relevant Medications   losartan (COZAAR) 50 MG tablet   Hypertension    Blood pressure elevated.  She stopped lisinopril secondary to cough.  Needs to be on blood pressure medication.  Start losartan 50mg  q day.  Follow pressures.  Follow metabolic panel.  Get her back in soon to reassess.        Relevant Medications   losartan (COZAAR) 50 MG tablet   Skin lesion - Primary    Abdominal wall lesion.  Refer to dermatology for  evaluation.        Relevant Orders   Ambulatory referral to Dermatology   Stress    Stress is some better.  She is coping with the recent death of her father.  Desires no further intervention.  Follow.            Einar Pheasant, MD

## 2015-08-10 NOTE — Progress Notes (Signed)
Pre-visit discussion using our clinic review tool. No additional management support is needed unless otherwise documented below in the visit note.  

## 2015-08-11 DIAGNOSIS — D692 Other nonthrombocytopenic purpura: Secondary | ICD-10-CM | POA: Diagnosis not present

## 2015-08-11 DIAGNOSIS — L820 Inflamed seborrheic keratosis: Secondary | ICD-10-CM | POA: Diagnosis not present

## 2015-08-11 DIAGNOSIS — L570 Actinic keratosis: Secondary | ICD-10-CM | POA: Diagnosis not present

## 2015-08-11 DIAGNOSIS — D225 Melanocytic nevi of trunk: Secondary | ICD-10-CM | POA: Diagnosis not present

## 2015-08-11 DIAGNOSIS — L814 Other melanin hyperpigmentation: Secondary | ICD-10-CM | POA: Diagnosis not present

## 2015-08-11 DIAGNOSIS — L821 Other seborrheic keratosis: Secondary | ICD-10-CM | POA: Diagnosis not present

## 2015-08-13 ENCOUNTER — Encounter: Payer: Self-pay | Admitting: Internal Medicine

## 2015-08-13 DIAGNOSIS — M549 Dorsalgia, unspecified: Secondary | ICD-10-CM | POA: Insufficient documentation

## 2015-08-13 NOTE — Assessment & Plan Note (Signed)
Abdominal wall lesion.  Refer to dermatology for evaluation.

## 2015-08-13 NOTE — Assessment & Plan Note (Signed)
Pain in her back and hips as outlined.  Discussed further evaluation.  She declines.  Declines xray.  Discussed stretches.  Will follow.  Tramadol given for pain.

## 2015-08-13 NOTE — Assessment & Plan Note (Signed)
Low cholesterol diet and exercise.  LDL increased to 149.  Discussed cholesterol medication.  She declines.  Follow lipid panel.

## 2015-08-13 NOTE — Assessment & Plan Note (Signed)
Blood pressure elevated.  She stopped lisinopril secondary to cough.  Needs to be on blood pressure medication.  Start losartan 50mg  q day.  Follow pressures.  Follow metabolic panel.  Get her back in soon to reassess.

## 2015-08-13 NOTE — Assessment & Plan Note (Signed)
Stress is some better.  She is coping with the recent death of her father.  Desires no further intervention.  Follow.

## 2015-09-07 DIAGNOSIS — M542 Cervicalgia: Secondary | ICD-10-CM | POA: Diagnosis not present

## 2015-09-07 DIAGNOSIS — I10 Essential (primary) hypertension: Secondary | ICD-10-CM | POA: Diagnosis not present

## 2015-09-11 ENCOUNTER — Other Ambulatory Visit: Payer: Self-pay | Admitting: Neurosurgery

## 2015-09-11 ENCOUNTER — Encounter: Payer: Self-pay | Admitting: Internal Medicine

## 2015-09-11 ENCOUNTER — Ambulatory Visit (INDEPENDENT_AMBULATORY_CARE_PROVIDER_SITE_OTHER): Payer: Medicare Other | Admitting: Internal Medicine

## 2015-09-11 VITALS — BP 120/70 | HR 59 | Temp 98.1°F | Resp 18 | Ht 64.0 in | Wt 157.0 lb

## 2015-09-11 DIAGNOSIS — E7800 Pure hypercholesterolemia, unspecified: Secondary | ICD-10-CM | POA: Diagnosis not present

## 2015-09-11 DIAGNOSIS — F439 Reaction to severe stress, unspecified: Secondary | ICD-10-CM

## 2015-09-11 DIAGNOSIS — M25511 Pain in right shoulder: Secondary | ICD-10-CM

## 2015-09-11 DIAGNOSIS — Z658 Other specified problems related to psychosocial circumstances: Secondary | ICD-10-CM | POA: Diagnosis not present

## 2015-09-11 DIAGNOSIS — M545 Low back pain, unspecified: Secondary | ICD-10-CM

## 2015-09-11 DIAGNOSIS — I10 Essential (primary) hypertension: Secondary | ICD-10-CM | POA: Diagnosis not present

## 2015-09-11 DIAGNOSIS — G8929 Other chronic pain: Secondary | ICD-10-CM

## 2015-09-11 DIAGNOSIS — M542 Cervicalgia: Secondary | ICD-10-CM

## 2015-09-11 DIAGNOSIS — R0989 Other specified symptoms and signs involving the circulatory and respiratory systems: Secondary | ICD-10-CM

## 2015-09-11 MED ORDER — TRAMADOL HCL 50 MG PO TABS: 50.0000 mg | ORAL_TABLET | Freq: Two times a day (BID) | ORAL | Status: DC | PRN

## 2015-09-11 MED ORDER — LOSARTAN POTASSIUM 100 MG PO TABS: 100.0000 mg | ORAL_TABLET | Freq: Every day | ORAL | Status: DC

## 2015-09-11 NOTE — Progress Notes (Signed)
Patient ID: KHAILEE BRY, female   DOB: 05/20/1946, 69 y.o.   MRN: [id number]   Subjective:    Patient ID: JOHNA OVERMILLER, female    DOB: 1946-01-16, 69 y.o.   MRN: [id number]  HPI  Patient with past history of hypercholesterolemia and hypertension who comes in today to follow up on these issues.  Lisinopril was stopped previously secondary to cough.  She was started on losartan last visit.  Tolerating.  Cough has subsided.  Not checking her sugars.  She stays active.  No cardiac symptoms with increased activity or exertion.  No sob.  Was having back and hip pain.  Started on tramadol.  Better.  Some neck discomfort - right.  Some pain in her shoulder and arm.  No weakness.  No nausea or vomiting.  Bowels stable.     Past Medical History  Diagnosis Date  . Hypercholesterolemia   . Hypertension   . Arthritis    Past Surgical History  Procedure Laterality Date  . Abdominal hysterectomy  1975  . Back surgery      ruptured disc  . Reconstruction of nose    . Anterior cervical decomp/discectomy fusion N/A 04/09/2013    Procedure: ANTERIOR CERVICAL DECOMPRESSION/DISCECTOMY FUSION 2 LEVELS;  Surgeon: Floyce Stakes, MD;  Location: [facility];  Service: Neurosurgery;  Laterality: N/A;  Cervical five-six Cervical six-seven  Anterior cervical decompression/diskectomy/fusion   Family History  Problem Relation Age of Onset  . Cancer Mother     ovarian/uterine  . Heart disease Mother   . Colon cancer Father    Social History   Social History  . Marital Status: Married    Spouse Name: N/A  . Number of Children: 2  . Years of Education: N/A   Social History Main Topics  . Smoking status: Never Smoker   . Smokeless tobacco: Never Used  . Alcohol Use: 0.0 oz/week    0 Standard drinks or equivalent per week     Comment: rarely  . Drug Use: No  . Sexual Activity: Yes   Other Topics Concern  . None   Social History Narrative    Outpatient Encounter Prescriptions as of 09/11/2015   Medication Sig  . aspirin EC 81 MG tablet Take 81 mg by mouth daily.  Marland Kitchen estradiol (CLIMARA - DOSED IN MG/24 HR) 0.1 mg/24hr patch APPLY 1 PATCH ON SKIN ONCE WEEKLY.  Marland Kitchen ibuprofen (ADVIL,MOTRIN) 200 MG tablet Take 200 mg by mouth every 8 (eight) hours as needed for pain, fever or headache.   . Multiple Vitamins-Minerals (CENTRUM PO) Take 1 tablet by mouth daily.   . traMADol (ULTRAM) 50 MG tablet Take 1 tablet (50 mg total) by mouth 2 (two) times daily as needed.  . [DISCONTINUED] losartan (COZAAR) 50 MG tablet Take 1 tablet (50 mg total) by mouth daily.  . [DISCONTINUED] traMADol (ULTRAM) 50 MG tablet Take 1 tablet (50 mg total) by mouth 2 (two) times daily as needed.  Marland Kitchen losartan (COZAAR) 100 MG tablet Take 1 tablet (100 mg total) by mouth daily.   No facility-administered encounter medications on file as of 09/11/2015.    Review of Systems  Constitutional: Negative for appetite change and unexpected weight change.  HENT: Negative for congestion and sinus pressure.   Respiratory: Negative for cough, chest tightness and shortness of breath.   Cardiovascular: Negative for chest pain, palpitations and leg swelling.  Gastrointestinal: Negative for nausea, vomiting, abdominal pain and diarrhea.  Genitourinary: Negative for dysuria and difficulty urinating.  Musculoskeletal: Positive for back pain (better. ).       Some neck and shoulder discomfort as outlined.   Skin: Negative for color change and rash.  Neurological: Negative for dizziness, light-headedness and headaches.  Psychiatric/Behavioral: Negative for dysphoric mood and agitation.       Objective:     Blood pressure rechecked by me:  158/72  Physical Exam  Constitutional: She appears well-developed and well-nourished. No distress.  HENT:  Nose: Nose normal.  Mouth/Throat: Oropharynx is clear and moist.  Eyes: Conjunctivae are normal. Right eye exhibits no discharge. Left eye exhibits no discharge.  Neck: Neck supple. No  thyromegaly present.  Cardiovascular: Normal rate and regular rhythm.   Left carotid bruit.    Pulmonary/Chest: Breath sounds normal. No respiratory distress. She has no wheezes.  Abdominal: Soft. Bowel sounds are normal. There is no tenderness.  Musculoskeletal: She exhibits no edema or tenderness.  Some increased discomfort - right neck.    Lymphadenopathy:    She has no cervical adenopathy.  Skin: No rash noted. No erythema.  Psychiatric: She has a normal mood and affect. Her behavior is normal.    BP 120/70 mmHg  Pulse 59  Temp(Src) 98.1 F (36.7 C) (Oral)  Resp 18  Ht 5\' 4"  (1.626 m)  Wt 157 lb (71.215 kg)  BMI 26.94 kg/m2  SpO2 96% Wt Readings from Last 3 Encounters:  09/11/15 157 lb (71.215 kg)  08/10/15 157 lb (71.215 kg)  02/08/15 158 lb 2 oz (71.725 kg)     Lab Results  Component Value Date   WBC 5.3 02/03/2015   HGB 13.1 02/03/2015   HCT 38.4 02/03/2015   PLT 220.0 02/03/2015   GLUCOSE 93 08/08/2015   CHOL 241* 08/08/2015   TRIG 174.0* 08/08/2015   HDL 56.80 08/08/2015   LDLDIRECT 151.7 08/16/2013   LDLCALC 149* 08/08/2015   ALT 23 08/08/2015   AST 16 08/08/2015   NA 139 08/08/2015   K 4.2 08/08/2015   CL 104 08/08/2015   CREATININE 0.62 08/08/2015   BUN 14 08/08/2015   CO2 28 08/08/2015   TSH 3.25 08/08/2015       Assessment & Plan:   Problem List Items Addressed This Visit    Back pain    Better on tramadol.  Follow.        Relevant Medications   traMADol (ULTRAM) 50 MG tablet   Hypercholesteremia    Low cholesterol diet and exercise.  Has declined cholesterol medication.  Follow lipid panel.        Relevant Medications   losartan (COZAAR) 100 MG tablet   Hypertension - Primary    Blood pressure still elevated.  Increase losartan to 100mg  q day.  Follow pressures.  Follow metabolic panel.  Have her spot check her pressure.        Relevant Medications   losartan (COZAAR) 100 MG tablet   Left carotid bruit    Schedule carotid  ultrasound.        Relevant Orders   Ambulatory referral to Vascular Surgery   Right shoulder pain    Is s/p anterior cervical decompression.  Neck had been doing better.  Some minimal discomfort right neck and shoulder.  Aggravated with turning.  Is some better.  Has tramadol.  Desires no further w/up at this time.  Follow.        Stress    Stress better.  Doing well.           Einar Pheasant, MD

## 2015-09-17 ENCOUNTER — Encounter: Payer: Self-pay | Admitting: Internal Medicine

## 2015-09-17 DIAGNOSIS — I6529 Occlusion and stenosis of unspecified carotid artery: Secondary | ICD-10-CM | POA: Insufficient documentation

## 2015-09-17 DIAGNOSIS — I779 Disorder of arteries and arterioles, unspecified: Secondary | ICD-10-CM | POA: Insufficient documentation

## 2015-09-17 NOTE — Assessment & Plan Note (Signed)
Low cholesterol diet and exercise.  Has declined cholesterol medication.  Follow lipid panel.

## 2015-09-17 NOTE — Assessment & Plan Note (Signed)
Schedule carotid ultrasound.  

## 2015-09-17 NOTE — Assessment & Plan Note (Signed)
Is s/p anterior cervical decompression.  Neck had been doing better.  Some minimal discomfort right neck and shoulder.  Aggravated with turning.  Is some better.  Has tramadol.  Desires no further w/up at this time.  Follow.

## 2015-09-17 NOTE — Assessment & Plan Note (Signed)
Better on tramadol.  Follow.

## 2015-09-17 NOTE — Assessment & Plan Note (Signed)
Blood pressure still elevated.  Increase losartan to 100mg  q day.  Follow pressures.  Follow metabolic panel.  Have her spot check her pressure.

## 2015-09-17 NOTE — Assessment & Plan Note (Signed)
Stress better.  Doing well.

## 2015-09-26 ENCOUNTER — Ambulatory Visit
Admission: RE | Admit: 2015-09-26 | Discharge: 2015-09-26 | Disposition: A | Discharge status: Home / Self Care | Payer: Medicare Other | Source: Ambulatory Visit | Attending: Neurosurgery | Admitting: Neurosurgery

## 2015-09-26 DIAGNOSIS — M545 Low back pain, unspecified: Secondary | ICD-10-CM

## 2015-09-26 DIAGNOSIS — G8929 Other chronic pain: Secondary | ICD-10-CM

## 2015-09-26 DIAGNOSIS — M4322 Fusion of spine, cervical region: Secondary | ICD-10-CM | POA: Diagnosis not present

## 2015-09-26 DIAGNOSIS — M542 Cervicalgia: Secondary | ICD-10-CM

## 2015-09-26 DIAGNOSIS — M5126 Other intervertebral disc displacement, lumbar region: Secondary | ICD-10-CM | POA: Diagnosis not present

## 2015-09-26 MED ORDER — IOHEXOL 300 MG/ML  SOLN
10.0000 mL | Freq: Once | INTRAMUSCULAR | Status: AC | PRN
  Administered 2015-09-26: 10 mL via INTRATHECAL

## 2015-09-26 MED ORDER — DIAZEPAM 5 MG PO TABS
5.0000 mg | ORAL_TABLET | Freq: Once | ORAL | Status: AC
  Administered 2015-09-26: 5 mg via ORAL

## 2015-09-26 NOTE — Progress Notes (Signed)
Pt states she has been off Tramadol for the past 2 days. Discharge instructions explained to pt. 

## 2015-09-26 NOTE — Discharge Instructions (Signed)
Myelogram Discharge Instructions  1. Go home and rest quietly for the next 24 hours.  It is important to lie flat for the next 24 hours.  Get up only to go to the restroom.  You may lie in the bed or on a couch on your back, your stomach, your left side or your right side.  You may have one pillow under your head.  You may have pillows between your knees while you are on your side or under your knees while you are on your back.  2. DO NOT drive today.  Recline the seat as far back as it will go, while still wearing your seat belt, on the way home.  3. You may get up to go to the bathroom as needed.  You may sit up for 10 minutes to eat.  You may resume your normal diet and medications unless otherwise indicated.  Drink lots of extra fluids today and tomorrow.  4. The incidence of headache, nausea, or vomiting is about 5% (one in 20 patients).  If you develop a headache, lie flat and drink plenty of fluids until the headache goes away.  Caffeinated beverages may be helpful.  If you develop severe nausea and vomiting or a headache that does not go away with flat bed rest, call [phone].  5. You may resume normal activities after your 24 hours of bed rest is over; however, do not exert yourself strongly or do any heavy lifting tomorrow. If when you get up you have a headache when standing, go back to bed and force fluids for another 24 hours.  6. Call your physician for a follow-up appointment.  The results of your myelogram will be sent directly to your physician by the following day.  7. If you have any questions or if complications develop after you arrive home, please call [phone].  Discharge instructions have been explained to the patient.  The patient, or the person responsible for the patient, fully understands these instructions.       May resume Tramadol on Dec. 7, 2016, after 11:00 am.

## 2015-10-05 DIAGNOSIS — I10 Essential (primary) hypertension: Secondary | ICD-10-CM | POA: Diagnosis not present

## 2015-10-05 DIAGNOSIS — M5412 Radiculopathy, cervical region: Secondary | ICD-10-CM | POA: Diagnosis not present

## 2015-10-05 DIAGNOSIS — R0989 Other specified symptoms and signs involving the circulatory and respiratory systems: Secondary | ICD-10-CM | POA: Diagnosis not present

## 2015-10-05 DIAGNOSIS — M5416 Radiculopathy, lumbar region: Secondary | ICD-10-CM | POA: Diagnosis not present

## 2015-10-11 ENCOUNTER — Other Ambulatory Visit: Payer: Self-pay | Admitting: Internal Medicine

## 2015-11-01 ENCOUNTER — Other Ambulatory Visit: Payer: Self-pay | Admitting: Internal Medicine

## 2015-11-01 NOTE — Telephone Encounter (Signed)
ok'd refill estrogen patch #12 with no refills.

## 2015-11-01 NOTE — Telephone Encounter (Signed)
Pt last filled 01/10/15 #4 patches with 11 refills, Last OV 09/16/15. Please advise/tvw

## 2015-11-15 ENCOUNTER — Ambulatory Visit (INDEPENDENT_AMBULATORY_CARE_PROVIDER_SITE_OTHER): Payer: Medicare Other | Admitting: Internal Medicine

## 2015-11-15 ENCOUNTER — Encounter: Payer: Self-pay | Admitting: Internal Medicine

## 2015-11-15 VITALS — BP 170/80 | HR 63 | Temp 97.8°F | Resp 18 | Ht 64.0 in | Wt 157.5 lb

## 2015-11-15 DIAGNOSIS — E7800 Pure hypercholesterolemia, unspecified: Secondary | ICD-10-CM | POA: Diagnosis not present

## 2015-11-15 DIAGNOSIS — R0989 Other specified symptoms and signs involving the circulatory and respiratory systems: Secondary | ICD-10-CM | POA: Diagnosis not present

## 2015-11-15 DIAGNOSIS — I6523 Occlusion and stenosis of bilateral carotid arteries: Secondary | ICD-10-CM

## 2015-11-15 DIAGNOSIS — G2581 Restless legs syndrome: Secondary | ICD-10-CM

## 2015-11-15 DIAGNOSIS — Z658 Other specified problems related to psychosocial circumstances: Secondary | ICD-10-CM

## 2015-11-15 DIAGNOSIS — I10 Essential (primary) hypertension: Secondary | ICD-10-CM | POA: Diagnosis not present

## 2015-11-15 DIAGNOSIS — M545 Low back pain, unspecified: Secondary | ICD-10-CM

## 2015-11-15 DIAGNOSIS — F439 Reaction to severe stress, unspecified: Secondary | ICD-10-CM

## 2015-11-15 MED ORDER — ESTRADIOL 0.1 MG/24HR TD PTWK: MEDICATED_PATCH | TRANSDERMAL | Status: DC

## 2015-11-15 MED ORDER — LOSARTAN POTASSIUM-HCTZ 100-12.5 MG PO TABS
1.0000 | ORAL_TABLET | Freq: Every day | ORAL | Status: DC
Start: 2015-11-15 — End: 2016-01-19

## 2015-11-15 MED ORDER — ROSUVASTATIN CALCIUM 5 MG PO TABS: 5.0000 mg | ORAL_TABLET | Freq: Every day | ORAL | Status: DC

## 2015-11-15 NOTE — Progress Notes (Signed)
Pre-visit discussion using our clinic review tool. No additional management support is needed unless otherwise documented below in the visit note.  

## 2015-11-15 NOTE — Progress Notes (Signed)
Patient ID: Kristen Strickland, female   DOB: [birth year], 70 y.o.   MRN: [id number]   Subjective:    Patient ID: Kristen Strickland, female    DOB: 1946-03-01, 70 y.o.   MRN: [id number]  HPI  Patient with past history of hypercholesterolemia and hypertension.  She comes in today to follow up on these issues.  Saw Dr Joya Salm for her back.  Had MRI.  Has spondylosis with narrowing at the level of L3-L4 and a calcified disc at the level of 4-5.  Also increased DDD L5-S1.  He started her on meloxicam.  This has helped.  Back is better.  Off tramadol.  Feels better.  Blood pressure elevated.  Discussed possible side effect of meloxicam.  No chest pain or tightness.  Tries to stay active.  No abdominal pain or cramping.  Bowels stable.     Past Medical History  Diagnosis Date  . Hypercholesterolemia   . Hypertension   . Arthritis    Past Surgical History  Procedure Laterality Date  . Abdominal hysterectomy  1975  . Back surgery      ruptured disc  . Reconstruction of nose    . Anterior cervical decomp/discectomy fusion N/A 04/09/2013    Procedure: ANTERIOR CERVICAL DECOMPRESSION/DISCECTOMY FUSION 2 LEVELS;  Surgeon: Floyce Stakes, MD;  Location: [facility];  Service: Neurosurgery;  Laterality: N/A;  Cervical five-six Cervical six-seven  Anterior cervical decompression/diskectomy/fusion   Family History  Problem Relation Age of Onset  . Cancer Mother     ovarian/uterine  . Heart disease Mother   . Colon cancer Father    Social History   Social History  . Marital Status: Married    Spouse Name: N/A  . Number of Children: 2  . Years of Education: N/A   Social History Main Topics  . Smoking status: Never Smoker   . Smokeless tobacco: Never Used  . Alcohol Use: 0.0 oz/week    0 Standard drinks or equivalent per week     Comment: rarely  . Drug Use: No  . Sexual Activity: Yes   Other Topics Concern  . None   Social History Narrative    Outpatient Encounter Prescriptions as of  11/15/2015  Medication Sig  . aspirin EC 81 MG tablet Take 81 mg by mouth daily.  Marland Kitchen estradiol (CLIMARA - DOSED IN MG/24 HR) 0.1 mg/24hr patch APPLY 1 PATCH ON SKIN ONCE WEEKLY.  Marland Kitchen ibuprofen (ADVIL,MOTRIN) 200 MG tablet Take 200 mg by mouth every 8 (eight) hours as needed for pain, fever or headache.   . meloxicam (MOBIC) 15 MG tablet   . Multiple Vitamins-Minerals (CENTRUM PO) Take 1 tablet by mouth daily.   . traMADol (ULTRAM) 50 MG tablet Take 1 tablet (50 mg total) by mouth 2 (two) times daily as needed.  . [DISCONTINUED] estradiol (CLIMARA - DOSED IN MG/24 HR) 0.1 mg/24hr patch APPLY 1 PATCH ON SKIN ONCE WEEKLY.  . [DISCONTINUED] lisinopril-hydrochlorothiazide (PRINZIDE,ZESTORETIC) 20-12.5 MG tablet TAKE (1) TABLET BY MOUTH DAILY FOR HIGH BLOOD PRESSURE.  . [DISCONTINUED] losartan (COZAAR) 100 MG tablet Take 1 tablet (100 mg total) by mouth daily.  Marland Kitchen losartan-hydrochlorothiazide (HYZAAR) 100-12.5 MG tablet Take 1 tablet by mouth daily.  . rosuvastatin (CRESTOR) 5 MG tablet Take 1 tablet (5 mg total) by mouth daily.   No facility-administered encounter medications on file as of 11/15/2015.    Review of Systems  Constitutional: Negative for appetite change and unexpected weight change.  HENT: Negative for congestion and sinus  pressure.   Respiratory: Negative for cough, chest tightness and shortness of breath.   Cardiovascular: Negative for chest pain, palpitations and leg swelling.  Gastrointestinal: Negative for nausea, vomiting, abdominal pain and diarrhea.  Genitourinary: Negative for dysuria and difficulty urinating.  Musculoskeletal:       Low back pain is better.  On meloxicam.    Skin: Negative for color change and rash.  Neurological: Negative for dizziness, light-headedness and headaches.  Psychiatric/Behavioral: Negative for dysphoric mood and agitation.       Objective:     Blood pressure rechecked by me:  160/82  Physical Exam  Constitutional: She appears  well-developed and well-nourished. No distress.  HENT:  Nose: Nose normal.  Mouth/Throat: Oropharynx is clear and moist.  Eyes: Conjunctivae are normal. Right eye exhibits no discharge. Left eye exhibits no discharge.  Neck: Neck supple. No thyromegaly present.  Cardiovascular: Normal rate and regular rhythm.   Pulmonary/Chest: Breath sounds normal. No respiratory distress. She has no wheezes.  Abdominal: Soft. Bowel sounds are normal. There is no tenderness.  Musculoskeletal: She exhibits no edema or tenderness.  Lymphadenopathy:    She has no cervical adenopathy.  Skin: No rash noted. No erythema.  Psychiatric: She has a normal mood and affect. Her behavior is normal.    BP 170/80 mmHg  Pulse 63  Temp(Src) 97.8 F (36.6 C) (Oral)  Resp 18  Ht 5\' 4"  (1.626 m)  Wt 157 lb 8 oz (71.442 kg)  BMI 27.02 kg/m2  SpO2 97% Wt Readings from Last 3 Encounters:  11/15/15 157 lb 8 oz (71.442 kg)  09/11/15 157 lb (71.215 kg)  08/10/15 157 lb (71.215 kg)     Lab Results  Component Value Date   WBC 5.3 02/03/2015   HGB 13.1 02/03/2015   HCT 38.4 02/03/2015   PLT 220.0 02/03/2015   GLUCOSE 93 08/08/2015   CHOL 241* 08/08/2015   TRIG 174.0* 08/08/2015   HDL 56.80 08/08/2015   LDLDIRECT 151.7 08/16/2013   LDLCALC 149* 08/08/2015   ALT 23 08/08/2015   AST 16 08/08/2015   NA 139 08/08/2015   K 4.2 08/08/2015   CL 104 08/08/2015   CREATININE 0.62 08/08/2015   BUN 14 08/08/2015   CO2 28 08/08/2015   TSH 3.25 08/08/2015    Ct Cervical Spine W Contrast  09/26/2015  CLINICAL DATA:  Low back pain. Prior lumbar discectomy 30 years ago. Neck pain. Restriction of neck motion, and deltoid weakness. Previous cervical fusion. 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 FLUOROSCOPY TIME:  1 minutes and 8 seconds. PROCEDURE: LUMBAR PUNCTURE FOR CERVICAL LUMBAR  MYELOGRAM CERVICAL AND LUMBAR MYELOGRAM CT CERVICAL MYELOGRAM CT LUMBAR MYELOGRAM After thorough discussion of risks and benefits of the  procedure including bleeding, infection, injury to nerves, blood vessels, adjacent structures as well as headache and CSF leak, written and oral informed consent was obtained. Consent was obtained by Dr. Rolla Flatten. Patient was positioned prone on the fluoroscopy table. Local anesthesia was provided with 1% lidocaine without epinephrine after prepped and draped in the usual sterile fashion. Puncture was performed at L3-L4 using a 3 1/2 inch 22-gauge spinal needle via midline approach. Using a single pass through the dura, the needle was placed within the thecal sac, with return of clear CSF. 10 mL Omnipaque-300 was injected into the thecal sac, with normal opacification of the nerve roots and cauda equina consistent with free flow within the subarachnoid space. The patient was then moved to the trendelenburg position and contrast flowed into the  Thoracic and Cervical spine regions. I personally performed the lumbar puncture and administered the intrathecal contrast. I also personally supervised acquisition of the myelogram images. TECHNIQUE: Contiguous axial images were obtained through the Cervical, Thoracic, and Lumbar spine after the intrathecal infusion of infusion. Coronal and sagittal reconstructions were obtained of the axial image sets. FINDINGS: CERVICAL AND LUMBAR MYELOGRAM FINDINGS: LUMBAR:  Good opacification lumbar subarachnoid space. Waist like narrowing at L5-S1 relates to prominent epidural fat, ventral osseous spurring, and posterior element hypertrophy. Severe disc space narrowing at the L5-S1 level is observed, status post lumbar discectomy. Anatomic alignment. Other intervertebral disc spaces are preserved. No frank S1 nerve root cut off. With patient standing, anatomic alignment is preserved, without dynamic instability. Increased bulging of the L4-5 disc is present with the patient upright. CERVICAL: Good opacification of the cervical subarachnoid space. Previous C5 through C7 fusion via ACDF  with anterior plating. Minor disc disease at C3-4 and C4-5 with calcified protrusions new effacing the anterior subarachnoid space, but no significant stenosis or cord compression. Hardware intact. Anatomic alignment. No dynamic instability on flexion extension CT CERVICAL MYELOGRAM FINDINGS: Alignment: Anatomic Vertebrae: No worrisome osseous lesions. Solid arthrodesis C5 through C7. Cord: No cord compression. Effacement anterior subarachnoid space with slight stenosis at C3-4 and C4-5. Posterior Fossa: No tonsillar herniation. Paraspinal tissues: No neck masses. Lung apices clear. Minor atherosclerosis. Disc levels: The individual disc spaces were examined as follows: C2-3:  Normal. C3-4: Calcified central protrusion. Mild effacement anterior subarachnoid space and minimal cord deformity. LEFT-sided facet arthropathy. LEFT-sided uncinate spurring results in mild effacement of the LEFT C4 nerve root. C4-5: Calcified central protrusion. Mild effacement anterior subarachnoid space and minimal cord deformity. LEFT greater than RIGHT uncinate spurring. Mild facet arthropathy. No definite C5 nerve root impingement. C5-6:  Solid fusion.  No impingement. C6-7:  Solid fusion.  No impingement. C7-T1: Slight bulge. Moderate RIGHT and mild LEFT facet arthropathy. No impingement. CT LUMBAR MYELOGRAM FINDINGS: L1-L2:  Normal. L2-L3:  Normal. L3-L4:  Osseous ridging.  Mild facet arthropathy.  No impingement. L4-L5: Osseous ridging. Mild facet arthropathy. Annular bulging. No significant stenosis or subarticular zone/foraminal zone narrowing. L5-S1: Severe disc space narrowing. Diffuse osseous ridging. Regrowth and filling in of previous laminotomy. LEFT greater than RIGHT facet arthropathy with annular bulging. Prominent ventral epidural fat. No subarticular zone narrowing affecting the S1 nerve roots but BILATERAL foraminal narrowing, much worse on the LEFT, associated with a leftward extraforaminal protrusion (image 59 series  3) with vacuum phenomenon, contributes to LEFT greater than RIGHT L5 nerve root impingement. IMPRESSION: CERVICAL: Status post C5-C7 fusion with solid arthrodesis. No dynamic instability. 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 Severe spinal stenosis is not established. Asymmetric foraminal narrowing at C3-4 on the LEFT could contribute to some LEFT-sided radicular symptoms. LUMBAR: Status post L5-S1 discectomy. No dynamic instability although mild annular bulging at L4-5 develops with the patient standing. Severe disc space narrowing at L5-S1. Extraforaminal protrusion at L5-S1 on the LEFT contributes to LEFT greater than RIGHT L5 nerve root impingement in conjunction with diffuse osseous ridging. Electronically Signed   By: Staci Righter M.D.   On: 09/26/2015 14:46   Ct Lumbar Spine W Contrast  09/26/2015  CLINICAL DATA:  Low back pain. Prior lumbar discectomy 30 years ago. Neck pain. Restriction of neck motion, and deltoid weakness. Previous cervical fusion. 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 FLUOROSCOPY TIME:  1 minutes and 8 seconds. PROCEDURE: LUMBAR  PUNCTURE FOR CERVICAL LUMBAR  MYELOGRAM CERVICAL AND LUMBAR MYELOGRAM CT CERVICAL MYELOGRAM CT LUMBAR MYELOGRAM After thorough discussion of risks and benefits of the procedure including bleeding, infection, injury to nerves, blood vessels, adjacent structures as well as headache and CSF leak, written and oral informed consent was obtained. Consent was obtained by Dr. Rolla Flatten. Patient was positioned prone on the fluoroscopy table. Local anesthesia was provided with 1% lidocaine without epinephrine after prepped and draped in the usual sterile fashion. Puncture was performed at L3-L4 using a 3 1/2 inch 22-gauge spinal needle via midline approach. Using a single pass through the dura, the needle was placed within the thecal sac, with return of clear CSF. 10 mL  Omnipaque-300 was injected into the thecal sac, with normal opacification of the nerve roots and cauda equina consistent with free flow within the subarachnoid space. The patient was then moved to the trendelenburg position and contrast flowed into the Thoracic and Cervical spine regions. I personally performed the lumbar puncture and administered the intrathecal contrast. I also personally supervised acquisition of the myelogram images. TECHNIQUE: Contiguous axial images were obtained through the Cervical, Thoracic, and Lumbar spine after the intrathecal infusion of infusion. Coronal and sagittal reconstructions were obtained of the axial image sets. FINDINGS: CERVICAL AND LUMBAR MYELOGRAM FINDINGS: LUMBAR:  Good opacification lumbar subarachnoid space. Waist like narrowing at L5-S1 relates to prominent epidural fat, ventral osseous spurring, and posterior element hypertrophy. Severe disc space narrowing at the L5-S1 level is observed, status post lumbar discectomy. Anatomic alignment. Other intervertebral disc spaces are preserved. No frank S1 nerve root cut off. With patient standing, anatomic alignment is preserved, without dynamic instability. Increased bulging of the L4-5 disc is present with the patient upright. CERVICAL: Good opacification of the cervical subarachnoid space. Previous C5 through C7 fusion via ACDF with anterior plating. Minor disc disease at C3-4 and C4-5 with calcified protrusions new effacing the anterior subarachnoid space, but no significant stenosis or cord compression. Hardware intact. Anatomic alignment. No dynamic instability on flexion extension CT CERVICAL MYELOGRAM FINDINGS: Alignment: Anatomic Vertebrae: No worrisome osseous lesions. Solid arthrodesis C5 through C7. Cord: No cord compression. Effacement anterior subarachnoid space with slight stenosis at C3-4 and C4-5. Posterior Fossa: No tonsillar herniation. Paraspinal tissues: No neck masses. Lung apices clear. Minor  atherosclerosis. Disc levels: The individual disc spaces were examined as follows: C2-3:  Normal. C3-4: Calcified central protrusion. Mild effacement anterior subarachnoid space and minimal cord deformity. LEFT-sided facet arthropathy. LEFT-sided uncinate spurring results in mild effacement of the LEFT C4 nerve root. C4-5: Calcified central protrusion. Mild effacement anterior subarachnoid space and minimal cord deformity. LEFT greater than RIGHT uncinate spurring. Mild facet arthropathy. No definite C5 nerve root impingement. C5-6:  Solid fusion.  No impingement. C6-7:  Solid fusion.  No impingement. C7-T1: Slight bulge. Moderate RIGHT and mild LEFT facet arthropathy. No impingement. CT LUMBAR MYELOGRAM FINDINGS: L1-L2:  Normal. L2-L3:  Normal. L3-L4:  Osseous ridging.  Mild facet arthropathy.  No impingement. L4-L5: Osseous ridging. Mild facet arthropathy. Annular bulging. No significant stenosis or subarticular zone/foraminal zone narrowing. L5-S1: Severe disc space narrowing. Diffuse osseous ridging. Regrowth and filling in of previous laminotomy. LEFT greater than RIGHT facet arthropathy with annular bulging. Prominent ventral epidural fat. No subarticular zone narrowing affecting the S1 nerve roots but BILATERAL foraminal narrowing, much worse on the LEFT, associated with a leftward extraforaminal protrusion (image 59 series 3) with vacuum phenomenon, contributes to LEFT greater than RIGHT L5 nerve root impingement. IMPRESSION: CERVICAL: Status  post C5-C7 fusion with solid arthrodesis. No dynamic instability. 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 Severe spinal stenosis is not established. Asymmetric foraminal narrowing at C3-4 on the LEFT could contribute to some LEFT-sided radicular symptoms. LUMBAR: Status post L5-S1 discectomy. No dynamic instability although mild annular bulging at L4-5 develops with the  patient standing. Severe disc space narrowing at L5-S1. Extraforaminal protrusion at L5-S1 on the LEFT contributes to LEFT greater than RIGHT L5 nerve root impingement in conjunction with diffuse osseous ridging. Electronically Signed   By: Staci Righter M.D.   On: 09/26/2015 14:46   Dg Myelography Lumbar Inj Multi Region  09/26/2015  CLINICAL DATA:  Low back pain. Prior lumbar discectomy 30 years ago. Neck pain. Restriction of neck motion, and deltoid weakness. Previous cervical fusion. 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 FLUOROSCOPY TIME:  1 minutes and 8 seconds. PROCEDURE: LUMBAR PUNCTURE FOR CERVICAL LUMBAR  MYELOGRAM CERVICAL AND LUMBAR MYELOGRAM CT CERVICAL MYELOGRAM CT LUMBAR MYELOGRAM After thorough discussion of risks and benefits of the procedure including bleeding, infection, injury to nerves, blood vessels, adjacent structures as well as headache and CSF leak, written and oral informed consent was obtained. Consent was obtained by Dr. Rolla Flatten. Patient was positioned prone on the fluoroscopy table. Local anesthesia was provided with 1% lidocaine without epinephrine after prepped and draped in the usual sterile fashion. Puncture was performed at L3-L4 using a 3 1/2 inch 22-gauge spinal needle via midline approach. Using a single pass through the dura, the needle was placed within the thecal sac, with return of clear CSF. 10 mL Omnipaque-300 was injected into the thecal sac, with normal opacification of the nerve roots and cauda equina consistent with free flow within the subarachnoid space. The patient was then moved to the trendelenburg position and contrast flowed into the Thoracic and Cervical spine regions. I personally performed the lumbar puncture and administered the intrathecal contrast. I also personally supervised acquisition of the myelogram images. TECHNIQUE: Contiguous axial images were obtained through the Cervical, Thoracic, and Lumbar spine after the intrathecal infusion of infusion.  Coronal and sagittal reconstructions were obtained of the axial image sets. FINDINGS: CERVICAL AND LUMBAR MYELOGRAM FINDINGS: LUMBAR:  Good opacification lumbar subarachnoid space. Waist like narrowing at L5-S1 relates to prominent epidural fat, ventral osseous spurring, and posterior element hypertrophy. Severe disc space narrowing at the L5-S1 level is observed, status post lumbar discectomy. Anatomic alignment. Other intervertebral disc spaces are preserved. No frank S1 nerve root cut off. With patient standing, anatomic alignment is preserved, without dynamic instability. Increased bulging of the L4-5 disc is present with the patient upright. CERVICAL: Good opacification of the cervical subarachnoid space. Previous C5 through C7 fusion via ACDF with anterior plating. Minor disc disease at C3-4 and C4-5 with calcified protrusions new effacing the anterior subarachnoid space, but no significant stenosis or cord compression. Hardware intact. Anatomic alignment. No dynamic instability on flexion extension CT CERVICAL MYELOGRAM FINDINGS: Alignment: Anatomic Vertebrae: No worrisome osseous lesions. Solid arthrodesis C5 through C7. Cord: No cord compression. Effacement anterior subarachnoid space with slight stenosis at C3-4 and C4-5. Posterior Fossa: No tonsillar herniation. Paraspinal tissues: No neck masses. Lung apices clear. Minor atherosclerosis. Disc levels: The individual disc spaces were examined as follows: C2-3:  Normal. C3-4: Calcified central protrusion. Mild effacement anterior subarachnoid space and minimal cord deformity. LEFT-sided facet arthropathy. LEFT-sided uncinate spurring results in mild effacement of the LEFT C4 nerve root. C4-5: Calcified  central protrusion. Mild effacement anterior subarachnoid space and minimal cord deformity. LEFT greater than RIGHT uncinate spurring. Mild facet arthropathy. No definite C5 nerve root impingement. C5-6:  Solid fusion.  No impingement. C6-7:  Solid fusion.   No impingement. C7-T1: Slight bulge. Moderate RIGHT and mild LEFT facet arthropathy. No impingement. CT LUMBAR MYELOGRAM FINDINGS: L1-L2:  Normal. L2-L3:  Normal. L3-L4:  Osseous ridging.  Mild facet arthropathy.  No impingement. L4-L5: Osseous ridging. Mild facet arthropathy. Annular bulging. No significant stenosis or subarticular zone/foraminal zone narrowing. L5-S1: Severe disc space narrowing. Diffuse osseous ridging. Regrowth and filling in of previous laminotomy. LEFT greater than RIGHT facet arthropathy with annular bulging. Prominent ventral epidural fat. No subarticular zone narrowing affecting the S1 nerve roots but BILATERAL foraminal narrowing, much worse on the LEFT, associated with a leftward extraforaminal protrusion (image 59 series 3) with vacuum phenomenon, contributes to LEFT greater than RIGHT L5 nerve root impingement. IMPRESSION: CERVICAL: Status post C5-C7 fusion with solid arthrodesis. No dynamic instability. 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 Severe spinal stenosis is not established. Asymmetric foraminal narrowing at C3-4 on the LEFT could contribute to some LEFT-sided radicular symptoms. LUMBAR: Status post L5-S1 discectomy. No dynamic instability although mild annular bulging at L4-5 develops with the patient standing. Severe disc space narrowing at L5-S1. Extraforaminal protrusion at L5-S1 on the LEFT contributes to LEFT greater than RIGHT L5 nerve root impingement in conjunction with diffuse osseous ridging. Electronically Signed   By: Staci Righter M.D.   On: 09/26/2015 14:46       Assessment & Plan:   Problem List Items Addressed This Visit    Back pain    MRI as outlined.  Saw [facility] - Dr Joya Salm.  On meloxicam.  Better.  Follow.  Try to taper.        Relevant Medications   meloxicam (MOBIC) 15 MG tablet   Hypercholesteremia    Cholesterol elevated above goal.  Carotid  ultrasound as outlined.  Start crestor as directed.  Check liver panel in 6 weeks.  Low cholesterol diet and exercise.        Relevant Medications   losartan-hydrochlorothiazide (HYZAAR) 100-12.5 MG tablet   rosuvastatin (CRESTOR) 5 MG tablet   Other Relevant Orders   Lipid panel   Hepatic function panel   Hypertension - Primary    Blood pressure elevated.  Discussed meloxicam and possible increase with this medication.  Will change losartan to losartan/hctz 100/12.5.  Follow pressures.  Follow metabolic panel.  Get her back in soon to reassess.  Try to decrease/taper meloxicam.        Relevant Medications   losartan-hydrochlorothiazide (HYZAAR) 100-12.5 MG tablet   rosuvastatin (CRESTOR) 5 MG tablet   Other Relevant Orders   Basic metabolic panel   Left carotid bruit    Carotid ultrasound revealed <50% right internal carotid artery stenosis and >70% left internal carotid artery stenosis.  Significant left external carotid artery stenosis.  Given findings, will refer to vascular surgery to follow.  Continue risk factor modification.  Start crestor.  Continue daily aspirin.        Stress    Doing better.  Follow.         Other Visit Diagnoses    Restless leg syndrome        Relevant Orders    CBC with Differential/Platelet        Einar Pheasant, MD

## 2015-11-15 NOTE — Patient Instructions (Signed)
Stop losartan  Start losartan/hctz 100/12.5 - one per day.    Start crestor.  Take three days per week.

## 2015-11-27 ENCOUNTER — Encounter: Payer: Self-pay | Admitting: Internal Medicine

## 2015-11-27 NOTE — Assessment & Plan Note (Signed)
Blood pressure elevated.  Discussed meloxicam and possible increase with this medication.  Will change losartan to losartan/hctz 100/12.5.  Follow pressures.  Follow metabolic panel.  Get her back in soon to reassess.  Try to decrease/taper meloxicam.

## 2015-11-27 NOTE — Assessment & Plan Note (Signed)
Doing better.  Follow.   

## 2015-11-27 NOTE — Assessment & Plan Note (Signed)
Cholesterol elevated above goal.  Carotid ultrasound as outlined.  Start crestor as directed.  Check liver panel in 6 weeks.  Low cholesterol diet and exercise.

## 2015-11-27 NOTE — Assessment & Plan Note (Signed)
MRI as outlined.  Saw [facility] - Dr Joya Salm.  On meloxicam.  Better.  Follow.  Try to taper.

## 2015-11-27 NOTE — Assessment & Plan Note (Signed)
Carotid ultrasound revealed <50% right internal carotid artery stenosis and >70% left internal carotid artery stenosis.  Significant left external carotid artery stenosis.  Given findings, will refer to vascular surgery to follow.  Continue risk factor modification.  Start crestor.  Continue daily aspirin.

## 2015-12-04 DIAGNOSIS — E785 Hyperlipidemia, unspecified: Secondary | ICD-10-CM | POA: Diagnosis not present

## 2015-12-04 DIAGNOSIS — I10 Essential (primary) hypertension: Secondary | ICD-10-CM | POA: Diagnosis not present

## 2015-12-04 DIAGNOSIS — M549 Dorsalgia, unspecified: Secondary | ICD-10-CM | POA: Diagnosis not present

## 2015-12-04 DIAGNOSIS — M542 Cervicalgia: Secondary | ICD-10-CM | POA: Diagnosis not present

## 2015-12-04 DIAGNOSIS — R0989 Other specified symptoms and signs involving the circulatory and respiratory systems: Secondary | ICD-10-CM | POA: Diagnosis not present

## 2015-12-04 DIAGNOSIS — I6523 Occlusion and stenosis of bilateral carotid arteries: Secondary | ICD-10-CM | POA: Diagnosis not present

## 2015-12-04 DIAGNOSIS — M5432 Sciatica, left side: Secondary | ICD-10-CM | POA: Diagnosis not present

## 2015-12-04 DIAGNOSIS — M5431 Sciatica, right side: Secondary | ICD-10-CM | POA: Diagnosis not present

## 2015-12-08 ENCOUNTER — Encounter: Payer: Self-pay | Admitting: Family Medicine

## 2015-12-08 ENCOUNTER — Ambulatory Visit (INDEPENDENT_AMBULATORY_CARE_PROVIDER_SITE_OTHER): Payer: Medicare Other | Admitting: Family Medicine

## 2015-12-08 VITALS — BP 138/88 | HR 60 | Temp 97.7°F | Ht 64.0 in | Wt 157.4 lb

## 2015-12-08 DIAGNOSIS — J069 Acute upper respiratory infection, unspecified: Secondary | ICD-10-CM

## 2015-12-08 DIAGNOSIS — I6523 Occlusion and stenosis of bilateral carotid arteries: Secondary | ICD-10-CM | POA: Diagnosis not present

## 2015-12-08 MED ORDER — LORATADINE 10 MG PO TABS: 10.0000 mg | ORAL_TABLET | Freq: Every day | ORAL | Status: DC

## 2015-12-08 MED ORDER — FLUTICASONE PROPIONATE 50 MCG/ACT NA SUSP: 2.0000 | Freq: Every day | NASAL | Status: DC

## 2015-12-08 NOTE — Assessment & Plan Note (Addendum)
Patient's symptoms most consistent with viral illness. Stable vital signs. Benign lung exam. We'll treat supportively with Flonase and Claritin. Tylenol as needed for discomfort. Can continue Chloraseptic drops. Stop Chloricidin. She'll continue to monitor. Given return precautions.

## 2015-12-08 NOTE — Progress Notes (Signed)
Pre visit review using our clinic review tool, if applicable. No additional management support is needed unless otherwise documented below in the visit note. 

## 2015-12-08 NOTE — Patient Instructions (Signed)
Nice to meet you. Your symptoms are likely related to a viral illness. We will treat this with Flonase and Claritin. He should stay well hydrated. She can take Tylenol as needed for discomfort. Please continue the Chloraseptic lozenges. If you develop chest pain, shortness of breath, cough, blood, worsening headache, vision changes, numbness, weakness, or any new or changing symptoms please seek medical attention immediately.

## 2015-12-08 NOTE — Progress Notes (Signed)
Patient ID: Kristen Strickland, female   DOB: 1946/09/05, 70 y.o.   MRN: [id number]  Tommi Rumps, MD Phone: [phone]  Kristen Strickland is a 70 y.o. female who presents today for same-day visit.  Patient notes she started to have a scratchy throat and then sore throat on Monday. Minimal postnasal drip. Some cough last night though not productive. No fevers, nasal or sinus congestion, or shortness of breath. Minimal headache that is stable from previously and has not changed. No vision changes, numbness, or weakness. No sick contacts. She states she thinks she is getting a virus. She's been using Coricidin and Chloraseptic lozenges.  PMH: nonsmoker.   ROS see history of present illness  Objective  Physical Exam Filed Vitals:   12/08/15 0808  BP: 138/88  Pulse: 60  Temp: 97.7 F (36.5 C)    BP Readings from Last 3 Encounters:  12/08/15 138/88  11/15/15 170/80  09/26/15 187/69   Wt Readings from Last 3 Encounters:  12/08/15 157 lb 6.4 oz (71.396 kg)  11/15/15 157 lb 8 oz (71.442 kg)  09/11/15 157 lb (71.215 kg)    Physical Exam  Constitutional: She is well-developed, well-nourished, and in no distress.  HENT:  Head: Normocephalic and atraumatic.  Right Ear: External ear normal.  Left Ear: External ear normal.  Mouth/Throat: Oropharynx is clear and moist. No oropharyngeal exudate.  Normal TMs bilaterally  Eyes: Conjunctivae are normal. Pupils are equal, round, and reactive to light.  Neck: Neck supple.  Cardiovascular: Normal rate, regular rhythm and normal heart sounds.  Exam reveals no gallop and no friction rub.   No murmur heard. Pulmonary/Chest: Effort normal and breath sounds normal. No respiratory distress. She has no wheezes. She has no rales.  Lymphadenopathy:    She has no cervical adenopathy.  Neurological: She is alert. Gait normal.  Skin: Skin is warm and dry. She is not diaphoretic.     Assessment/Plan: Please see individual problem list.  Viral  upper respiratory infection Patient's symptoms most consistent with viral illness. Stable vital signs. Benign lung exam. We'll treat supportively with Flonase and Claritin. Tylenol as needed for discomfort. Can continue Chloraseptic drops. Stop Chloricidin. She'll continue to monitor. Given return precautions.    Meds ordered this encounter  Medications  . loratadine (CLARITIN) 10 MG tablet    Sig: Take 1 tablet (10 mg total) by mouth daily.    Dispense:  30 tablet    Refill:  1  . fluticasone (FLONASE) 50 MCG/ACT nasal spray    Sig: Place 2 sprays into both nostrils daily.    Dispense:  16 g    Refill:  1     Tommi Rumps

## 2016-01-08 ENCOUNTER — Other Ambulatory Visit: Payer: Self-pay | Admitting: Internal Medicine

## 2016-01-12 ENCOUNTER — Other Ambulatory Visit (INDEPENDENT_AMBULATORY_CARE_PROVIDER_SITE_OTHER): Payer: Medicare Other

## 2016-01-12 DIAGNOSIS — I10 Essential (primary) hypertension: Secondary | ICD-10-CM

## 2016-01-12 DIAGNOSIS — E7800 Pure hypercholesterolemia, unspecified: Secondary | ICD-10-CM | POA: Diagnosis not present

## 2016-01-12 DIAGNOSIS — G2581 Restless legs syndrome: Secondary | ICD-10-CM

## 2016-01-12 LAB — LIPID PANEL
Cholesterol: 174 mg/dL (ref 0–200)
HDL: 50.6 mg/dL (ref >39.00)
LDL Cholesterol: 100 mg/dL — ABNORMAL HIGH (ref 0–99)
NonHDL: 123.64
Total CHOL/HDL Ratio: 3
Triglycerides: 116 mg/dL (ref 0.0–149.0)
VLDL: 23.2 mg/dL (ref 0.0–40.0)

## 2016-01-12 LAB — BASIC METABOLIC PANEL
BUN: 15 mg/dL (ref 6–23)
CO2: 30 mEq/L (ref 19–32)
Calcium: 9.2 mg/dL (ref 8.4–10.5)
Chloride: 104 mEq/L (ref 96–112)
Creatinine, Ser: 0.69 mg/dL (ref 0.40–1.20)
GFR: 89.73 mL/min (ref >60.00)
Glucose, Bld: 96 mg/dL (ref 70–99)
Potassium: 4.4 mEq/L (ref 3.5–5.1)
Sodium: 139 mEq/L (ref 135–145)

## 2016-01-12 LAB — CBC WITH DIFFERENTIAL/PLATELET
Basophils Absolute: 0 10*3/uL (ref 0.0–0.1)
Basophils Relative: 0.8 % (ref 0.0–3.0)
Eosinophils Absolute: 0.2 10*3/uL (ref 0.0–0.7)
Eosinophils Relative: 3.2 % (ref 0.0–5.0)
HCT: 38.2 % (ref 36.0–46.0)
Hemoglobin: 12.8 g/dL (ref 12.0–15.0)
Lymphocytes Relative: 26.7 % (ref 12.0–46.0)
Lymphs Abs: 1.5 10*3/uL (ref 0.7–4.0)
MCHC: 33.6 g/dL (ref 30.0–36.0)
MCV: 88.8 fl (ref 78.0–100.0)
Monocytes Absolute: 0.4 10*3/uL (ref 0.1–1.0)
Monocytes Relative: 7.4 % (ref 3.0–12.0)
Neutro Abs: 3.4 10*3/uL (ref 1.4–7.7)
Neutrophils Relative %: 61.9 % (ref 43.0–77.0)
Platelets: 251 10*3/uL (ref 150.0–400.0)
RBC: 4.3 Mil/uL (ref 3.87–5.11)
RDW: 13.9 % (ref 11.5–15.5)
WBC: 5.5 10*3/uL (ref 4.0–10.5)

## 2016-01-12 LAB — HEPATIC FUNCTION PANEL
ALT: 19 U/L (ref 0–35)
AST: 15 U/L (ref 0–37)
Albumin: 3.9 g/dL (ref 3.5–5.2)
Alkaline Phosphatase: 53 U/L (ref 39–117)
Bilirubin, Direct: 0.1 mg/dL (ref 0.0–0.3)
Total Bilirubin: 0.8 mg/dL (ref 0.2–1.2)
Total Protein: 6.7 g/dL (ref 6.0–8.3)

## 2016-01-19 ENCOUNTER — Ambulatory Visit (INDEPENDENT_AMBULATORY_CARE_PROVIDER_SITE_OTHER): Payer: Medicare Other | Admitting: Internal Medicine

## 2016-01-19 ENCOUNTER — Encounter: Payer: Self-pay | Admitting: Internal Medicine

## 2016-01-19 VITALS — BP 140/80 | HR 64 | Temp 97.8°F | Resp 18 | Ht 64.0 in | Wt 155.2 lb

## 2016-01-19 DIAGNOSIS — B001 Herpesviral vesicular dermatitis: Secondary | ICD-10-CM | POA: Diagnosis not present

## 2016-01-19 DIAGNOSIS — Z658 Other specified problems related to psychosocial circumstances: Secondary | ICD-10-CM

## 2016-01-19 DIAGNOSIS — I6523 Occlusion and stenosis of bilateral carotid arteries: Secondary | ICD-10-CM

## 2016-01-19 DIAGNOSIS — I10 Essential (primary) hypertension: Secondary | ICD-10-CM | POA: Diagnosis not present

## 2016-01-19 DIAGNOSIS — E7800 Pure hypercholesterolemia, unspecified: Secondary | ICD-10-CM

## 2016-01-19 DIAGNOSIS — F439 Reaction to severe stress, unspecified: Secondary | ICD-10-CM

## 2016-01-19 MED ORDER — LOSARTAN POTASSIUM-HCTZ 100-25 MG PO TABS: 1.0000 | ORAL_TABLET | Freq: Every day | ORAL | Status: DC

## 2016-01-19 MED ORDER — VALACYCLOVIR HCL 500 MG PO TABS: 500.0000 mg | ORAL_TABLET | Freq: Two times a day (BID) | ORAL | Status: DC | PRN

## 2016-01-19 NOTE — Progress Notes (Signed)
Patient ID: Kristen Strickland, female   DOB: 01-09-1946, 70 y.o.   MRN: [id number]   Subjective:    Patient ID: Kristen Strickland, female    DOB: 01/16/1946, 70 y.o.   MRN: [id number]  HPI  Patient here for a scheduled follow up.  We have been adjusting her blood pressure medication.  Blood pressure doing some better.  Still elevated.  She also started cholesterol medication.  Cholesterol improved.  Discussed diet and exercise.  No chest pain or tightness.  No sob.  No acid reflux.  No abdominal pain or cramping.  Some occasional nausea.  Bowels stable.  Had fever blister.  Took her daughter's valtrex.  Helped.  Requested rx for valtrex to have if needed.  Handling stress relatively well.     Past Medical History  Diagnosis Date  . Hypercholesterolemia   . Hypertension   . Arthritis    Past Surgical History  Procedure Laterality Date  . Abdominal hysterectomy  1975  . Back surgery      ruptured disc  . Reconstruction of nose    . Anterior cervical decomp/discectomy fusion N/A 04/09/2013    Procedure: ANTERIOR CERVICAL DECOMPRESSION/DISCECTOMY FUSION 2 LEVELS;  Surgeon: Floyce Stakes, MD;  Location: [facility];  Service: Neurosurgery;  Laterality: N/A;  Cervical five-six Cervical six-seven  Anterior cervical decompression/diskectomy/fusion   Family History  Problem Relation Age of Onset  . Cancer Mother     ovarian/uterine  . Heart disease Mother   . Colon cancer Father    Social History   Social History  . Marital Status: Married    Spouse Name: N/A  . Number of Children: 2  . Years of Education: N/A   Social History Main Topics  . Smoking status: Never Smoker   . Smokeless tobacco: Never Used  . Alcohol Use: 0.0 oz/week    0 Standard drinks or equivalent per week     Comment: rarely  . Drug Use: No  . Sexual Activity: Yes   Other Topics Concern  . None   Social History Narrative    Outpatient Encounter Prescriptions as of 01/19/2016  Medication Sig  . aspirin EC  81 MG tablet Take 81 mg by mouth daily.  Marland Kitchen estradiol (CLIMARA - DOSED IN MG/24 HR) 0.1 mg/24hr patch APPLY 1 PATCH ON SKIN ONCE WEEKLY.  . fluticasone (FLONASE) 50 MCG/ACT nasal spray Place 2 sprays into both nostrils daily.  Marland Kitchen ibuprofen (ADVIL,MOTRIN) 200 MG tablet Take 200 mg by mouth every 8 (eight) hours as needed for pain, fever or headache.   . loratadine (CLARITIN) 10 MG tablet Take 1 tablet (10 mg total) by mouth daily.  . meloxicam (MOBIC) 15 MG tablet   . Multiple Vitamins-Minerals (CENTRUM PO) Take 1 tablet by mouth daily.   . rosuvastatin (CRESTOR) 5 MG tablet TAKE 1 TABLET BY MOUTH ONCE DAILY.  . traMADol (ULTRAM) 50 MG tablet Take 1 tablet (50 mg total) by mouth 2 (two) times daily as needed.  . [DISCONTINUED] losartan-hydrochlorothiazide (HYZAAR) 100-12.5 MG tablet Take 1 tablet by mouth daily.  Marland Kitchen losartan-hydrochlorothiazide (HYZAAR) 100-25 MG tablet Take 1 tablet by mouth daily.  . valACYclovir (VALTREX) 500 MG tablet Take 1 tablet (500 mg total) by mouth 2 (two) times daily as needed.   No facility-administered encounter medications on file as of 01/19/2016.    Review of Systems  Constitutional: Negative for appetite change and unexpected weight change.  HENT: Negative for congestion and sinus pressure.   Respiratory: Negative  for cough, chest tightness and shortness of breath.   Cardiovascular: Negative for chest pain, palpitations and leg swelling.  Gastrointestinal: Positive for nausea. Negative for vomiting, abdominal pain and rectal pain.  Genitourinary: Negative for dysuria and difficulty urinating.  Musculoskeletal: Negative for back pain and joint swelling.  Skin: Negative for color change and rash.  Neurological: Negative for dizziness, light-headedness and headaches.  Psychiatric/Behavioral: Negative for dysphoric mood and agitation.       Objective:     Blood pressure rechecked by me:  164-158/80  Physical Exam  Constitutional: She appears well-developed  and well-nourished. No distress.  HENT:  Nose: Nose normal.  Mouth/Throat: Oropharynx is clear and moist.  Neck: Neck supple. No thyromegaly present.  Cardiovascular: Normal rate and regular rhythm.   Pulmonary/Chest: Breath sounds normal. No respiratory distress. She has no wheezes.  Abdominal: Soft. Bowel sounds are normal. There is no tenderness.  Musculoskeletal: She exhibits no edema or tenderness.  Lymphadenopathy:    She has no cervical adenopathy.  Skin: No rash noted. No erythema.  Psychiatric: She has a normal mood and affect. Her behavior is normal.    BP 140/80 mmHg  Pulse 64  Temp(Src) 97.8 F (36.6 C) (Oral)  Resp 18  Ht 5\' 4"  (1.626 m)  Wt 155 lb 4 oz (70.421 kg)  BMI 26.64 kg/m2  SpO2 96% Wt Readings from Last 3 Encounters:  01/19/16 155 lb 4 oz (70.421 kg)  12/08/15 157 lb 6.4 oz (71.396 kg)  11/15/15 157 lb 8 oz (71.442 kg)     Lab Results  Component Value Date   WBC 5.5 01/12/2016   HGB 12.8 01/12/2016   HCT 38.2 01/12/2016   PLT 251.0 01/12/2016   GLUCOSE 96 01/12/2016   CHOL 174 01/12/2016   TRIG 116.0 01/12/2016   HDL 50.60 01/12/2016   LDLDIRECT 151.7 08/16/2013   LDLCALC 100* 01/12/2016   ALT 19 01/12/2016   AST 15 01/12/2016   NA 139 01/12/2016   K 4.4 01/12/2016   CL 104 01/12/2016   CREATININE 0.69 01/12/2016   BUN 15 01/12/2016   CO2 30 01/12/2016   TSH 3.25 08/08/2015        Assessment & Plan:   Problem List Items Addressed This Visit    Fever blister    Resolved now.  Valtrex rx given to have if needed.        Relevant Medications   valACYclovir (VALTREX) 500 MG tablet   Hypercholesteremia    On crestor now.  LDL 100 on recent check.  Much improved.  Follow.        Relevant Medications   losartan-hydrochlorothiazide (HYZAAR) 100-25 MG tablet   Hypertension - Primary    Blood pressure still elevated.  Recheck as outlined.  Increase losartan/hctz to 100/25 q day.  Follow pressures.  Get her back in soon to reassess.   Cr just checked and wnl.        Relevant Medications   losartan-hydrochlorothiazide (HYZAAR) 100-25 MG tablet   Stress    Handling stress well.  Follow.            Einar Pheasant, MD

## 2016-01-19 NOTE — Progress Notes (Signed)
Pre-visit discussion using our clinic review tool. No additional management support is needed unless otherwise documented below in the visit note.  

## 2016-01-21 ENCOUNTER — Encounter: Payer: Self-pay | Admitting: Internal Medicine

## 2016-01-21 DIAGNOSIS — B001 Herpesviral vesicular dermatitis: Secondary | ICD-10-CM | POA: Insufficient documentation

## 2016-01-21 NOTE — Assessment & Plan Note (Signed)
Blood pressure still elevated.  Recheck as outlined.  Increase losartan/hctz to 100/25 q day.  Follow pressures.  Get her back in soon to reassess.  Cr just checked and wnl.

## 2016-01-21 NOTE — Assessment & Plan Note (Signed)
Handling stress well.  Follow.  

## 2016-01-21 NOTE — Assessment & Plan Note (Signed)
On crestor now.  LDL 100 on recent check.  Much improved.  Follow.

## 2016-01-21 NOTE — Assessment & Plan Note (Signed)
Resolved now.  Valtrex rx given to have if needed.

## 2016-02-23 ENCOUNTER — Ambulatory Visit (INDEPENDENT_AMBULATORY_CARE_PROVIDER_SITE_OTHER): Payer: Medicare Other | Admitting: Family Medicine

## 2016-02-23 ENCOUNTER — Encounter: Payer: Self-pay | Admitting: Family Medicine

## 2016-02-23 VITALS — BP 136/92 | HR 74 | Temp 98.4°F | Ht 64.0 in | Wt 159.2 lb

## 2016-02-23 DIAGNOSIS — S81802A Unspecified open wound, left lower leg, initial encounter: Secondary | ICD-10-CM

## 2016-02-23 DIAGNOSIS — I6523 Occlusion and stenosis of bilateral carotid arteries: Secondary | ICD-10-CM | POA: Diagnosis not present

## 2016-02-23 MED ORDER — MEDIHONEY WOUND/BURN DRESSING EX PSTE: 1.0000 "application " | PASTE | Freq: Every day | CUTANEOUS | Status: DC

## 2016-02-23 NOTE — Patient Instructions (Signed)
Use the medihoney for your wound.  Follow up in the next 2 weeks.  Take care  Dr. Lacinda Axon

## 2016-02-23 NOTE — Assessment & Plan Note (Signed)
New problem. Wound not yet healed. No signs of infection. Treating with Medihoney.

## 2016-02-23 NOTE — Progress Notes (Signed)
   Subjective:  Patient ID: Kristen Strickland, female    DOB: 1946/07/01  Age: 70 y.o. MRN: [id number]  CC: Wound, L leg  HPI:  70 year old female presents with the above complaints.  Patient states that she fell on some bricks steps approximately 2 months ago. She injured her anterior left shin. She suffered an abrasion. She states that since that time the wound has not completely healed. She states that she is quite concerned about this. She's had some intermittent drainage. No surrounding erythema. No reports of fevers or chills. She is concerned that it has not healed. She states that it intermittently is painful. She's been applying topical "grease" with lack of resolution.  Social Hx   Social History   Social History  . Marital Status: Married    Spouse Name: N/A  . Number of Children: 2  . Years of Education: N/A   Social History Main Topics  . Smoking status: Never Smoker   . Smokeless tobacco: Never Used  . Alcohol Use: 0.0 oz/week    0 Standard drinks or equivalent per week     Comment: rarely  . Drug Use: No  . Sexual Activity: Yes   Other Topics Concern  . None   Social History Narrative   Review of Systems  Constitutional: Negative.   Skin: Positive for wound.   Objective:  BP 136/92 mmHg  Pulse 74  Temp(Src) 98.4 F (36.9 C) (Oral)  Ht 5\' 4"  (1.626 m)  Wt 159 lb 4 oz (72.235 kg)  BMI 27.32 kg/m2  SpO2 97%  BP/Weight 02/23/2016 01/19/2016 99991111  Systolic BP [id number]  Diastolic BP 92 80 88  Wt. (Lbs) 159.25 155.25 157.4  BMI 27.32 26.64 27   Physical Exam  Constitutional: She is oriented to person, place, and time. She appears well-developed. No distress.  Pulmonary/Chest: Effort normal.  Neurological: She is alert and oriented to person, place, and time.  Skin:  Left anterior lower leg - 3 cm superficial wound. Central eschar 1.5 cm in diameter. No drainage. No erythema.  Psychiatric: She has a normal mood and affect.  Vitals reviewed.  Lab  Results  Component Value Date   WBC 5.5 01/12/2016   HGB 12.8 01/12/2016   HCT 38.2 01/12/2016   PLT 251.0 01/12/2016   GLUCOSE 96 01/12/2016   CHOL 174 01/12/2016   TRIG 116.0 01/12/2016   HDL 50.60 01/12/2016   LDLDIRECT 151.7 08/16/2013   LDLCALC 100* 01/12/2016   ALT 19 01/12/2016   AST 15 01/12/2016   NA 139 01/12/2016   K 4.4 01/12/2016   CL 104 01/12/2016   CREATININE 0.69 01/12/2016   BUN 15 01/12/2016   CO2 30 01/12/2016   TSH 3.25 08/08/2015   Assessment & Plan:   Problem List Items Addressed This Visit    Leg wound, left - Primary    New problem. Wound not yet healed. No signs of infection. Treating with Medihoney.         Meds ordered this encounter  Medications  . Wound Dressings (MEDIHONEY WOUND/BURN DRESSING) PSTE    Sig: Apply 1 application topically daily.    Dispense:  103 mL    Refill:  0   Follow-up: 2 weeks  [place]

## 2016-02-23 NOTE — Progress Notes (Signed)
Pre visit review using our clinic review tool, if applicable. No additional management support is needed unless otherwise documented below in the visit note. 

## 2016-03-12 ENCOUNTER — Ambulatory Visit (INDEPENDENT_AMBULATORY_CARE_PROVIDER_SITE_OTHER): Payer: Medicare Other

## 2016-03-12 VITALS — BP 148/80 | HR 67 | Temp 98.2°F | Resp 14 | Ht 64.0 in | Wt 157.8 lb

## 2016-03-12 DIAGNOSIS — Z0000 Encounter for general adult medical examination without abnormal findings: Secondary | ICD-10-CM | POA: Diagnosis not present

## 2016-03-12 NOTE — Progress Notes (Signed)
Subjective:   Kristen Strickland is a 70 y.o. female who presents for an Initial Medicare Annual Wellness Visit.  Review of Systems    No ROS.  Medicare Wellness Visit.  Cardiac Risk Factors include: advanced age (>66men, >39 women);hypertension     Objective:    Today's Vitals   03/12/16 1030  BP: 148/80  Pulse: 67  Temp: 98.2 F (36.8 C)  TempSrc: Oral  Resp: 14  Height: 5\' 4"  (1.626 m)  Weight: 157 lb 12.8 oz (71.578 kg)  SpO2: 97%   Body mass index is 27.07 kg/(m^2).   Current Medications (verified) Outpatient Encounter Prescriptions as of 03/12/2016  Medication Sig  . aspirin EC 81 MG tablet Take 81 mg by mouth daily.  Marland Kitchen estradiol (CLIMARA - DOSED IN MG/24 HR) 0.1 mg/24hr patch APPLY 1 PATCH ON SKIN ONCE WEEKLY.  . fluticasone (FLONASE) 50 MCG/ACT nasal spray Place 2 sprays into both nostrils daily.  Marland Kitchen ibuprofen (ADVIL,MOTRIN) 200 MG tablet Take 200 mg by mouth every 8 (eight) hours as needed for pain, fever or headache.   . loratadine (CLARITIN) 10 MG tablet Take 1 tablet (10 mg total) by mouth daily.  Marland Kitchen losartan-hydrochlorothiazide (HYZAAR) 100-25 MG tablet Take 1 tablet by mouth daily.  . meloxicam (MOBIC) 15 MG tablet   . Multiple Vitamins-Minerals (CENTRUM PO) Take 1 tablet by mouth daily.   . rosuvastatin (CRESTOR) 5 MG tablet TAKE 1 TABLET BY MOUTH ONCE DAILY.  . traMADol (ULTRAM) 50 MG tablet Take 1 tablet (50 mg total) by mouth 2 (two) times daily as needed.  . valACYclovir (VALTREX) 500 MG tablet Take 1 tablet (500 mg total) by mouth 2 (two) times daily as needed.  . Wound Dressings (MEDIHONEY WOUND/BURN DRESSING) PSTE Apply 1 application topically daily.   No facility-administered encounter medications on file as of 03/12/2016.    Allergies (verified) Review of patient's allergies indicates no known allergies.   History: Past Medical History  Diagnosis Date  . Hypercholesterolemia   . Hypertension   . Arthritis    Past Surgical History  Procedure  Laterality Date  . Abdominal hysterectomy  1975  . Back surgery      ruptured disc  . Reconstruction of nose    . Anterior cervical decomp/discectomy fusion N/A 04/09/2013    Procedure: ANTERIOR CERVICAL DECOMPRESSION/DISCECTOMY FUSION 2 LEVELS;  Surgeon: Floyce Stakes, MD;  Location: [facility];  Service: Neurosurgery;  Laterality: N/A;  Cervical five-six Cervical six-seven  Anterior cervical decompression/diskectomy/fusion   Family History  Problem Relation Age of Onset  . Cancer Mother     ovarian/uterine  . Heart disease Mother   . Colon cancer Father    Social History   Occupational History  . Not on file.   Social History Main Topics  . Smoking status: Never Smoker   . Smokeless tobacco: Never Used  . Alcohol Use: 0.0 oz/week    0 Standard drinks or equivalent per week     Comment: rarely  . Drug Use: No  . Sexual Activity: Yes    Tobacco Counseling Counseling given: Not Answered   Activities of Daily Living In your present state of health, do you have any difficulty performing the following activities: 03/12/2016  Hearing? N  Vision? N  Difficulty concentrating or making decisions? Y  Walking or climbing stairs? Y  Dressing or bathing? N  Doing errands, shopping? N  Preparing Food and eating ? N  Using the Toilet? N  In the past six  months, have you accidently leaked urine? Y  Do you have problems with loss of bowel control? Y  Managing your Medications? N  Managing your Finances? N  Housekeeping or managing your Housekeeping? N    Immunizations and Health Maintenance  There is no immunization history on file for this patient. Health Maintenance Due  Topic Date Due  . Hepatitis C Screening  1945-12-04  . COLONOSCOPY  04/24/1997  . DEXA SCAN  04/24/2012  . PNA vac Low Risk Adult (1 of 2 - PCV13) 04/24/2012    Patient Care Team: Einar Pheasant, MD as PCP - General (Internal Medicine)  Indicate any recent Medical Services you may have received  from other than [facility] providers in the past year (date may be approximate).     Assessment:   This is a routine wellness examination for Hani. The goal of the wellness visit is to assist the patient how to close the gaps in care and create a preventative care plan for the patient.   Osteoporosis risk reviewed.DEXA Scan postponed, patient request.    Medications reviewed; taking without issues or barriers.  Safety issues reviewed; smoke detectors in the home. No firearms in the home. Wears seatbelts when driving or riding with others. No violence in the home.  No identified risk were noted; The patient was oriented x 3; appropriate in dress and manner and no objective failures at ADL's or IADL's.   COLONOSCOPY deferred; educational material provided for COLOguard.  Follow up with PCP.  Prevnar 13, TDAP and ZOSTAVAX vaccine postponed, per patient request.    Hepatitis C screening; educational material provided.  Ophthalmologist; she will update PCP when selected.    Patient Concerns:  None at this time.  Follow up with PCP as needed.  Hearing/Vision screen Hearing Screening Comments: Passed the whisper test Vision Screening Comments: No current ophthalmologist selected Wears reading glasses only Vision screening deferred   Dietary issues and exercise activities discussed: Current Exercise Habits: The patient does not participate in regular exercise at present  Goals    . Healthy Lifestyle     Stay hydrated and drink plenty of fluids. Low carb foods. Lean meats and vegetables. Stay active and start walking for exercise, as tolerated.      Depression Screen PHQ 2/9 Scores 03/12/2016 02/08/2015 10/05/2014 08/19/2013 02/15/2013 08/19/2012  PHQ - 2 Score 0 0 0 0 0 1    Fall Risk Fall Risk  03/12/2016 02/08/2015 10/05/2014 08/19/2013 02/15/2013  Falls in the past year? Yes No No Yes No  Number falls in past yr: 1 - - 1 -  Injury with Fall? No - - No -  Risk for fall due  to : - - - - -  Risk for fall due to (comments): - - - - -  Follow up Education provided;Falls prevention discussed - - - -    Cognitive Function: MMSE - Mini Mental State Exam 03/12/2016  Orientation to time 5  Orientation to Place 5  Registration 3  Attention/ Calculation 5  Recall 2  Language- name 2 objects 2  Language- repeat 1  Language- follow 3 step command 3  Language- read & follow direction 1  Write a sentence 1  Copy design 1  Total score 29    Screening Tests Health Maintenance  Topic Date Due  . Hepatitis C Screening  [birth year]  . COLONOSCOPY  04/24/1997  . DEXA SCAN  04/24/2012  . PNA vac Low Risk Adult (1 of 2 - PCV13) 04/24/2012  .  MAMMOGRAM  03/21/2016 (Originally 03/30/2015)  . ZOSTAVAX  04/12/2017 (Originally 04/25/2007)  . TETANUS/TDAP  04/12/2017 (Originally 04/24/1966)  . INFLUENZA VACCINE  05/21/2016      Plan:   End of life planning; Advance aging; Advanced directives discussed. HCPOA/Living Will educational material deferred.  Follow up with PCP.   During the course of the visit, Shirleyan was educated and counseled about the following appropriate screening and preventive services:   Vaccines to include Pneumoccal, Influenza, Hepatitis B, Td, Zostavax, HCV  Electrocardiogram  Cardiovascular disease screening  Colorectal cancer screening  Bone density screening  Diabetes screening  Glaucoma screening  Mammography/PAP  Nutrition counseling  Smoking cessation counseling  Patient Instructions (the written plan) were given to the patient.    Varney Biles, LPN   [id number]    Reviewed above information.  Agree with plan.   Dr Nicki Reaper

## 2016-03-12 NOTE — Patient Instructions (Addendum)
Kristen Strickland , Thank you for taking time to come for your Medicare Wellness Visit. I appreciate your ongoing commitment to your health goals. Please review the following plan we discussed and let me know if I can assist you in the future.   Follow up with Dr. Nicki Reaper as needed.   This is a list of the screening recommended for you and due dates:  Health Maintenance  Topic Date Due  .  Hepatitis C: One time screening is recommended by Center for Disease Control  (CDC) for  adults born from [birth year] through 1964.   26-Dec-1945  . Colon Cancer Screening  04/24/1997  . DEXA scan (bone density measurement)  04/24/2012  . Pneumonia vaccines (1 of 2 - PCV13) 04/24/2012  . Mammogram  03/21/2016*  . Shingles Vaccine  04/12/2017*  . Tetanus Vaccine  04/12/2017*  . Flu Shot  05/21/2016  *Topic was postponed. The date shown is not the original due date.    Fall Prevention in the Home  Falls can cause injuries. They can happen to people of all ages. There are many things you can do to make your home safe and to help prevent falls.  WHAT CAN I DO ON THE OUTSIDE OF MY HOME?  Regularly fix the edges of walkways and driveways and fix any cracks.  Remove anything that might make you trip as you walk through a door, such as a raised step or threshold.  Trim any bushes or trees on the path to your home.  Use bright outdoor lighting.  Clear any walking paths of anything that might make someone trip, such as rocks or tools.  Regularly check to see if handrails are loose or broken. Make sure that both sides of any steps have handrails.  Any raised decks and porches should have guardrails on the edges.  Have any leaves, snow, or ice cleared regularly.  Use sand or salt on walking paths during winter.  Clean up any spills in your garage right away. This includes oil or grease spills. WHAT CAN I DO IN THE BATHROOM?   Use night lights.  Install grab bars by the toilet and in the tub and shower. Do not  use towel bars as grab bars.  Use non-skid mats or decals in the tub or shower.  If you need to sit down in the shower, use a plastic, non-slip stool.  Keep the floor dry. Clean up any water that spills on the floor as soon as it happens.  Remove soap buildup in the tub or shower regularly.  Attach bath mats securely with double-sided non-slip rug tape.  Do not have throw rugs and other things on the floor that can make you trip. WHAT CAN I DO IN THE BEDROOM?  Use night lights.  Make sure that you have a light by your bed that is easy to reach.  Do not use any sheets or blankets that are too big for your bed. They should not hang down onto the floor.  Have a firm chair that has side arms. You can use this for support while you get dressed.  Do not have throw rugs and other things on the floor that can make you trip. WHAT CAN I DO IN THE KITCHEN?  Clean up any spills right away.  Avoid walking on wet floors.  Keep items that you use a lot in easy-to-reach places.  If you need to reach something above you, use a strong step stool that has a grab  bar.  Keep electrical cords out of the way.  Do not use floor polish or wax that makes floors slippery. If you must use wax, use non-skid floor wax.  Do not have throw rugs and other things on the floor that can make you trip. WHAT CAN I DO WITH MY STAIRS?  Do not leave any items on the stairs.  Make sure that there are handrails on both sides of the stairs and use them. Fix handrails that are broken or loose. Make sure that handrails are as long as the stairways.  Check any carpeting to make sure that it is firmly attached to the stairs. Fix any carpet that is loose or worn.  Avoid having throw rugs at the top or bottom of the stairs. If you do have throw rugs, attach them to the floor with carpet tape.  Make sure that you have a light switch at the top of the stairs and the bottom of the stairs. If you do not have them, ask  someone to add them for you. WHAT ELSE CAN I DO TO HELP PREVENT FALLS?  Wear shoes that:  Do not have high heels.  Have rubber bottoms.  Are comfortable and fit you well.  Are closed at the toe. Do not wear sandals.  If you use a stepladder:  Make sure that it is fully opened. Do not climb a closed stepladder.  Make sure that both sides of the stepladder are locked into place.  Ask someone to hold it for you, if possible.  Clearly mark and make sure that you can see:  Any grab bars or handrails.  First and last steps.  Where the edge of each step is.  Use tools that help you move around (mobility aids) if they are needed. These include:  Canes.  Walkers.  Scooters.  Crutches.  Turn on the lights when you go into a dark area. Replace any light bulbs as soon as they burn out.  Set up your furniture so you have a clear path. Avoid moving your furniture around.  If any of your floors are uneven, fix them.  If there are any pets around you, be aware of where they are.  Review your medicines with your doctor. Some medicines can make you feel dizzy. This can increase your chance of falling. Ask your doctor what other things that you can do to help prevent falls.   This information is not intended to replace advice given to you by your health care provider. Make sure you discuss any questions you have with your health care provider.   Document Released: 08/03/2009 Document Revised: 02/21/2015 Document Reviewed: 11/11/2014 [employer] Interactive Patient Education [employer].

## 2016-03-21 ENCOUNTER — Other Ambulatory Visit: Payer: Self-pay | Admitting: Internal Medicine

## 2016-03-29 ENCOUNTER — Encounter: Payer: Self-pay | Admitting: Internal Medicine

## 2016-03-29 ENCOUNTER — Ambulatory Visit (INDEPENDENT_AMBULATORY_CARE_PROVIDER_SITE_OTHER): Payer: Medicare Other | Admitting: Internal Medicine

## 2016-03-29 VITALS — BP 140/80 | HR 68 | Temp 98.0°F | Wt 159.0 lb

## 2016-03-29 DIAGNOSIS — I10 Essential (primary) hypertension: Secondary | ICD-10-CM | POA: Diagnosis not present

## 2016-03-29 DIAGNOSIS — I6523 Occlusion and stenosis of bilateral carotid arteries: Secondary | ICD-10-CM | POA: Diagnosis not present

## 2016-03-29 DIAGNOSIS — S81802A Unspecified open wound, left lower leg, initial encounter: Secondary | ICD-10-CM

## 2016-03-29 DIAGNOSIS — E7800 Pure hypercholesterolemia, unspecified: Secondary | ICD-10-CM | POA: Diagnosis not present

## 2016-03-29 DIAGNOSIS — M25511 Pain in right shoulder: Secondary | ICD-10-CM

## 2016-03-29 MED ORDER — TRAMADOL HCL 50 MG PO TABS: 50.0000 mg | ORAL_TABLET | Freq: Two times a day (BID) | ORAL | Status: DC | PRN

## 2016-03-29 MED ORDER — TRIAMCINOLONE ACETONIDE 0.1 % EX CREA: 1.0000 "application " | TOPICAL_CREAM | Freq: Two times a day (BID) | CUTANEOUS | Status: DC

## 2016-03-29 NOTE — Progress Notes (Signed)
Pre visit review using our clinic review tool, if applicable. No additional management support is needed unless otherwise documented below in the visit note. 

## 2016-03-29 NOTE — Progress Notes (Signed)
Patient ID: Kristen Strickland, female   DOB: 11/15/1945, 70 y.o.   MRN: [id number]   Subjective:    Patient ID: Kristen Strickland, female    DOB: 10-05-1946, 70 y.o.   MRN: [id number]  HPI  Patient here for a scheduled follow up.  States she is doing well.  Feels good.  Stays active.  No cardiac symptoms with increased activity or exertion.  No sob.  No acid reflux.  No abdominal pain or cramping.  Bowels stable.  Has a persistent non healing lesion - left lower leg.  Has been present for months.  Has not been checking her blood pressure regularly.  Taking meloxicam.  Discussed possibility of meloxicam elevating blood pressure.     Past Medical History  Diagnosis Date  . Hypercholesterolemia   . Hypertension   . Arthritis    Past Surgical History  Procedure Laterality Date  . Abdominal hysterectomy  1975  . Back surgery      ruptured disc  . Reconstruction of nose    . Anterior cervical decomp/discectomy fusion N/A 04/09/2013    Procedure: ANTERIOR CERVICAL DECOMPRESSION/DISCECTOMY FUSION 2 LEVELS;  Surgeon: Floyce Stakes, MD;  Location: [facility];  Service: Neurosurgery;  Laterality: N/A;  Cervical five-six Cervical six-seven  Anterior cervical decompression/diskectomy/fusion   Family History  Problem Relation Age of Onset  . Cancer Mother     ovarian/uterine  . Heart disease Mother   . Colon cancer Father    Social History   Social History  . Marital Status: Married    Spouse Name: N/A  . Number of Children: 2  . Years of Education: N/A   Social History Main Topics  . Smoking status: Never Smoker   . Smokeless tobacco: Never Used  . Alcohol Use: 0.0 oz/week    0 Standard drinks or equivalent per week     Comment: rarely  . Drug Use: No  . Sexual Activity: Yes   Other Topics Concern  . None   Social History Narrative    Outpatient Encounter Prescriptions as of 03/29/2016  Medication Sig  . aspirin EC 81 MG tablet Take 81 mg by mouth daily.  Marland Kitchen estradiol (CLIMARA -  DOSED IN MG/24 HR) 0.1 mg/24hr patch APPLY 1 PATCH ON SKIN ONCE WEEKLY.  Marland Kitchen loratadine (CLARITIN) 10 MG tablet Take 1 tablet (10 mg total) by mouth daily.  Marland Kitchen losartan-hydrochlorothiazide (HYZAAR) 100-25 MG tablet TAKE 1 TABLET BY MOUTH ONCE DAILY.  . meloxicam (MOBIC) 15 MG tablet   . Multiple Vitamins-Minerals (CENTRUM PO) Take 1 tablet by mouth daily.   . rosuvastatin (CRESTOR) 5 MG tablet TAKE 1 TABLET BY MOUTH ONCE DAILY.  . traMADol (ULTRAM) 50 MG tablet Take 1 tablet (50 mg total) by mouth 2 (two) times daily as needed.  . valACYclovir (VALTREX) 500 MG tablet Take 1 tablet (500 mg total) by mouth 2 (two) times daily as needed.  . Wound Dressings (MEDIHONEY WOUND/BURN DRESSING) PSTE Apply 1 application topically daily.  . [DISCONTINUED] traMADol (ULTRAM) 50 MG tablet Take 1 tablet (50 mg total) by mouth 2 (two) times daily as needed.  . fluticasone (FLONASE) 50 MCG/ACT nasal spray Place 2 sprays into both nostrils daily. (Patient not taking: Reported on 03/29/2016)  . ibuprofen (ADVIL,MOTRIN) 200 MG tablet Take 200 mg by mouth every 8 (eight) hours as needed for pain, fever or headache. Reported on 03/29/2016  . triamcinolone cream (KENALOG) 0.1 % Apply 1 application topically 2 (two) times daily.   No  facility-administered encounter medications on file as of 03/29/2016.    Review of Systems  Constitutional: Negative for appetite change and unexpected weight change.  HENT: Negative for congestion and sinus pressure.   Respiratory: Negative for cough, chest tightness and shortness of breath.   Cardiovascular: Negative for chest pain, palpitations and leg swelling.  Gastrointestinal: Negative for nausea, vomiting, abdominal pain and diarrhea.  Genitourinary: Negative for dysuria and difficulty urinating.  Musculoskeletal: Negative for back pain and joint swelling.  Skin: Negative for color change and rash.       Persistent non healing leg lesion.   Neurological: Negative for dizziness,  light-headedness and headaches.  Psychiatric/Behavioral: Negative for dysphoric mood and agitation.       Objective:    Physical Exam  Constitutional: She appears well-developed and well-nourished. No distress.  HENT:  Nose: Nose normal.  Mouth/Throat: Oropharynx is clear and moist.  Neck: Neck supple. No thyromegaly present.  Cardiovascular: Normal rate and regular rhythm.   Pulmonary/Chest: Breath sounds normal. No respiratory distress. She has no wheezes.  Abdominal: Soft. Bowel sounds are normal. There is no tenderness.  Musculoskeletal: She exhibits no edema or tenderness.  Lymphadenopathy:    She has no cervical adenopathy.  Skin: No rash noted. No erythema.  Circular leg lesion - left lower leg.    Psychiatric: She has a normal mood and affect. Her behavior is normal.    BP 140/80 mmHg  Pulse 68  Temp(Src) 98 F (36.7 C) (Oral)  Wt 159 lb (72.122 kg) Wt Readings from Last 3 Encounters:  03/29/16 159 lb (72.122 kg)  03/12/16 157 lb 12.8 oz (71.578 kg)  02/23/16 159 lb 4 oz (72.235 kg)     Lab Results  Component Value Date   WBC 5.5 01/12/2016   HGB 12.8 01/12/2016   HCT 38.2 01/12/2016   PLT 251.0 01/12/2016   GLUCOSE 96 01/12/2016   CHOL 174 01/12/2016   TRIG 116.0 01/12/2016   HDL 50.60 01/12/2016   LDLDIRECT 151.7 08/16/2013   LDLCALC 100* 01/12/2016   ALT 19 01/12/2016   AST 15 01/12/2016   NA 139 01/12/2016   K 4.4 01/12/2016   CL 104 01/12/2016   CREATININE 0.69 01/12/2016   BUN 15 01/12/2016   CO2 30 01/12/2016   TSH 3.25 08/08/2015    Ct Cervical Spine W Contrast  09/26/2015  CLINICAL DATA:  Low back pain. Prior lumbar discectomy 30 years ago. Neck pain. Restriction of neck motion, and deltoid weakness. Previous cervical fusion. 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 FLUOROSCOPY TIME:  1 minutes and 8 seconds. PROCEDURE: LUMBAR PUNCTURE FOR CERVICAL LUMBAR  MYELOGRAM CERVICAL AND LUMBAR MYELOGRAM CT CERVICAL MYELOGRAM CT LUMBAR MYELOGRAM After  thorough discussion of risks and benefits of the procedure including bleeding, infection, injury to nerves, blood vessels, adjacent structures as well as headache and CSF leak, written and oral informed consent was obtained. Consent was obtained by Dr. Rolla Flatten. Patient was positioned prone on the fluoroscopy table. Local anesthesia was provided with 1% lidocaine without epinephrine after prepped and draped in the usual sterile fashion. Puncture was performed at L3-L4 using a 3 1/2 inch 22-gauge spinal needle via midline approach. Using a single pass through the dura, the needle was placed within the thecal sac, with return of clear CSF. 10 mL Omnipaque-300 was injected into the thecal sac, with normal opacification of the nerve roots and cauda equina consistent with free flow within the subarachnoid space. The patient was then moved to the trendelenburg position and  contrast flowed into the Thoracic and Cervical spine regions. I personally performed the lumbar puncture and administered the intrathecal contrast. I also personally supervised acquisition of the myelogram images. TECHNIQUE: Contiguous axial images were obtained through the Cervical, Thoracic, and Lumbar spine after the intrathecal infusion of infusion. Coronal and sagittal reconstructions were obtained of the axial image sets. FINDINGS: CERVICAL AND LUMBAR MYELOGRAM FINDINGS: LUMBAR:  Good opacification lumbar subarachnoid space. Waist like narrowing at L5-S1 relates to prominent epidural fat, ventral osseous spurring, and posterior element hypertrophy. Severe disc space narrowing at the L5-S1 level is observed, status post lumbar discectomy. Anatomic alignment. Other intervertebral disc spaces are preserved. No frank S1 nerve root cut off. With patient standing, anatomic alignment is preserved, without dynamic instability. Increased bulging of the L4-5 disc is present with the patient upright. CERVICAL: Good opacification of the cervical  subarachnoid space. Previous C5 through C7 fusion via ACDF with anterior plating. Minor disc disease at C3-4 and C4-5 with calcified protrusions new effacing the anterior subarachnoid space, but no significant stenosis or cord compression. Hardware intact. Anatomic alignment. No dynamic instability on flexion extension CT CERVICAL MYELOGRAM FINDINGS: Alignment: Anatomic Vertebrae: No worrisome osseous lesions. Solid arthrodesis C5 through C7. Cord: No cord compression. Effacement anterior subarachnoid space with slight stenosis at C3-4 and C4-5. Posterior Fossa: No tonsillar herniation. Paraspinal tissues: No neck masses. Lung apices clear. Minor atherosclerosis. Disc levels: The individual disc spaces were examined as follows: C2-3:  Normal. C3-4: Calcified central protrusion. Mild effacement anterior subarachnoid space and minimal cord deformity. LEFT-sided facet arthropathy. LEFT-sided uncinate spurring results in mild effacement of the LEFT C4 nerve root. C4-5: Calcified central protrusion. Mild effacement anterior subarachnoid space and minimal cord deformity. LEFT greater than RIGHT uncinate spurring. Mild facet arthropathy. No definite C5 nerve root impingement. C5-6:  Solid fusion.  No impingement. C6-7:  Solid fusion.  No impingement. C7-T1: Slight bulge. Moderate RIGHT and mild LEFT facet arthropathy. No impingement. CT LUMBAR MYELOGRAM FINDINGS: L1-L2:  Normal. L2-L3:  Normal. L3-L4:  Osseous ridging.  Mild facet arthropathy.  No impingement. L4-L5: Osseous ridging. Mild facet arthropathy. Annular bulging. No significant stenosis or subarticular zone/foraminal zone narrowing. L5-S1: Severe disc space narrowing. Diffuse osseous ridging. Regrowth and filling in of previous laminotomy. LEFT greater than RIGHT facet arthropathy with annular bulging. Prominent ventral epidural fat. No subarticular zone narrowing affecting the S1 nerve roots but BILATERAL foraminal narrowing, much worse on the LEFT, associated  with a leftward extraforaminal protrusion (image 59 series 3) with vacuum phenomenon, contributes to LEFT greater than RIGHT L5 nerve root impingement. IMPRESSION: CERVICAL: Status post C5-C7 fusion with solid arthrodesis. No dynamic instability. 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 Severe spinal stenosis is not established. Asymmetric foraminal narrowing at C3-4 on the LEFT could contribute to some LEFT-sided radicular symptoms. LUMBAR: Status post L5-S1 discectomy. No dynamic instability although mild annular bulging at L4-5 develops with the patient standing. Severe disc space narrowing at L5-S1. Extraforaminal protrusion at L5-S1 on the LEFT contributes to LEFT greater than RIGHT L5 nerve root impingement in conjunction with diffuse osseous ridging. Electronically Signed   By: Staci Righter M.D.   On: 09/26/2015 14:46   Ct Lumbar Spine W Contrast  09/26/2015  CLINICAL DATA:  Low back pain. Prior lumbar discectomy 30 years ago. Neck pain. Restriction of neck motion, and deltoid weakness. Previous cervical fusion. 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 FLUOROSCOPY TIME:  1 minutes and  8 seconds. PROCEDURE: LUMBAR PUNCTURE FOR CERVICAL LUMBAR  MYELOGRAM CERVICAL AND LUMBAR MYELOGRAM CT CERVICAL MYELOGRAM CT LUMBAR MYELOGRAM After thorough discussion of risks and benefits of the procedure including bleeding, infection, injury to nerves, blood vessels, adjacent structures as well as headache and CSF leak, written and oral informed consent was obtained. Consent was obtained by Dr. Rolla Flatten. Patient was positioned prone on the fluoroscopy table. Local anesthesia was provided with 1% lidocaine without epinephrine after prepped and draped in the usual sterile fashion. Puncture was performed at L3-L4 using a 3 1/2 inch 22-gauge spinal needle via midline approach. Using a single pass through the dura, the needle was placed  within the thecal sac, with return of clear CSF. 10 mL Omnipaque-300 was injected into the thecal sac, with normal opacification of the nerve roots and cauda equina consistent with free flow within the subarachnoid space. The patient was then moved to the trendelenburg position and contrast flowed into the Thoracic and Cervical spine regions. I personally performed the lumbar puncture and administered the intrathecal contrast. I also personally supervised acquisition of the myelogram images. TECHNIQUE: Contiguous axial images were obtained through the Cervical, Thoracic, and Lumbar spine after the intrathecal infusion of infusion. Coronal and sagittal reconstructions were obtained of the axial image sets. FINDINGS: CERVICAL AND LUMBAR MYELOGRAM FINDINGS: LUMBAR:  Good opacification lumbar subarachnoid space. Waist like narrowing at L5-S1 relates to prominent epidural fat, ventral osseous spurring, and posterior element hypertrophy. Severe disc space narrowing at the L5-S1 level is observed, status post lumbar discectomy. Anatomic alignment. Other intervertebral disc spaces are preserved. No frank S1 nerve root cut off. With patient standing, anatomic alignment is preserved, without dynamic instability. Increased bulging of the L4-5 disc is present with the patient upright. CERVICAL: Good opacification of the cervical subarachnoid space. Previous C5 through C7 fusion via ACDF with anterior plating. Minor disc disease at C3-4 and C4-5 with calcified protrusions new effacing the anterior subarachnoid space, but no significant stenosis or cord compression. Hardware intact. Anatomic alignment. No dynamic instability on flexion extension CT CERVICAL MYELOGRAM FINDINGS: Alignment: Anatomic Vertebrae: No worrisome osseous lesions. Solid arthrodesis C5 through C7. Cord: No cord compression. Effacement anterior subarachnoid space with slight stenosis at C3-4 and C4-5. Posterior Fossa: No tonsillar herniation. Paraspinal  tissues: No neck masses. Lung apices clear. Minor atherosclerosis. Disc levels: The individual disc spaces were examined as follows: C2-3:  Normal. C3-4: Calcified central protrusion. Mild effacement anterior subarachnoid space and minimal cord deformity. LEFT-sided facet arthropathy. LEFT-sided uncinate spurring results in mild effacement of the LEFT C4 nerve root. C4-5: Calcified central protrusion. Mild effacement anterior subarachnoid space and minimal cord deformity. LEFT greater than RIGHT uncinate spurring. Mild facet arthropathy. No definite C5 nerve root impingement. C5-6:  Solid fusion.  No impingement. C6-7:  Solid fusion.  No impingement. C7-T1: Slight bulge. Moderate RIGHT and mild LEFT facet arthropathy. No impingement. CT LUMBAR MYELOGRAM FINDINGS: L1-L2:  Normal. L2-L3:  Normal. L3-L4:  Osseous ridging.  Mild facet arthropathy.  No impingement. L4-L5: Osseous ridging. Mild facet arthropathy. Annular bulging. No significant stenosis or subarticular zone/foraminal zone narrowing. L5-S1: Severe disc space narrowing. Diffuse osseous ridging. Regrowth and filling in of previous laminotomy. LEFT greater than RIGHT facet arthropathy with annular bulging. Prominent ventral epidural fat. No subarticular zone narrowing affecting the S1 nerve roots but BILATERAL foraminal narrowing, much worse on the LEFT, associated with a leftward extraforaminal protrusion (image 59 series 3) with vacuum phenomenon, contributes to LEFT greater than RIGHT L5 nerve root  impingement. IMPRESSION: CERVICAL: Status post C5-C7 fusion with solid arthrodesis. No dynamic instability. 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 Severe spinal stenosis is not established. Asymmetric foraminal narrowing at C3-4 on the LEFT could contribute to some LEFT-sided radicular symptoms. LUMBAR: Status post L5-S1 discectomy. No dynamic instability although  mild annular bulging at L4-5 develops with the patient standing. Severe disc space narrowing at L5-S1. Extraforaminal protrusion at L5-S1 on the LEFT contributes to LEFT greater than RIGHT L5 nerve root impingement in conjunction with diffuse osseous ridging. Electronically Signed   By: Staci Righter M.D.   On: 09/26/2015 14:46   Dg Myelography Lumbar Inj Multi Region  09/26/2015  CLINICAL DATA:  Low back pain. Prior lumbar discectomy 30 years ago. Neck pain. Restriction of neck motion, and deltoid weakness. Previous cervical fusion. 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 FLUOROSCOPY TIME:  1 minutes and 8 seconds. PROCEDURE: LUMBAR PUNCTURE FOR CERVICAL LUMBAR  MYELOGRAM CERVICAL AND LUMBAR MYELOGRAM CT CERVICAL MYELOGRAM CT LUMBAR MYELOGRAM After thorough discussion of risks and benefits of the procedure including bleeding, infection, injury to nerves, blood vessels, adjacent structures as well as headache and CSF leak, written and oral informed consent was obtained. Consent was obtained by Dr. Rolla Flatten. Patient was positioned prone on the fluoroscopy table. Local anesthesia was provided with 1% lidocaine without epinephrine after prepped and draped in the usual sterile fashion. Puncture was performed at L3-L4 using a 3 1/2 inch 22-gauge spinal needle via midline approach. Using a single pass through the dura, the needle was placed within the thecal sac, with return of clear CSF. 10 mL Omnipaque-300 was injected into the thecal sac, with normal opacification of the nerve roots and cauda equina consistent with free flow within the subarachnoid space. The patient was then moved to the trendelenburg position and contrast flowed into the Thoracic and Cervical spine regions. I personally performed the lumbar puncture and administered the intrathecal contrast. I also personally supervised acquisition of the myelogram images. TECHNIQUE: Contiguous axial images were obtained through the Cervical, Thoracic, and Lumbar  spine after the intrathecal infusion of infusion. Coronal and sagittal reconstructions were obtained of the axial image sets. FINDINGS: CERVICAL AND LUMBAR MYELOGRAM FINDINGS: LUMBAR:  Good opacification lumbar subarachnoid space. Waist like narrowing at L5-S1 relates to prominent epidural fat, ventral osseous spurring, and posterior element hypertrophy. Severe disc space narrowing at the L5-S1 level is observed, status post lumbar discectomy. Anatomic alignment. Other intervertebral disc spaces are preserved. No frank S1 nerve root cut off. With patient standing, anatomic alignment is preserved, without dynamic instability. Increased bulging of the L4-5 disc is present with the patient upright. CERVICAL: Good opacification of the cervical subarachnoid space. Previous C5 through C7 fusion via ACDF with anterior plating. Minor disc disease at C3-4 and C4-5 with calcified protrusions new effacing the anterior subarachnoid space, but no significant stenosis or cord compression. Hardware intact. Anatomic alignment. No dynamic instability on flexion extension CT CERVICAL MYELOGRAM FINDINGS: Alignment: Anatomic Vertebrae: No worrisome osseous lesions. Solid arthrodesis C5 through C7. Cord: No cord compression. Effacement anterior subarachnoid space with slight stenosis at C3-4 and C4-5. Posterior Fossa: No tonsillar herniation. Paraspinal tissues: No neck masses. Lung apices clear. Minor atherosclerosis. Disc levels: The individual disc spaces were examined as follows: C2-3:  Normal. C3-4: Calcified central protrusion. Mild effacement anterior subarachnoid space and minimal cord deformity. LEFT-sided facet arthropathy. LEFT-sided uncinate spurring results in mild effacement of the LEFT C4  nerve root. C4-5: Calcified central protrusion. Mild effacement anterior subarachnoid space and minimal cord deformity. LEFT greater than RIGHT uncinate spurring. Mild facet arthropathy. No definite C5 nerve root impingement. C5-6:   Solid fusion.  No impingement. C6-7:  Solid fusion.  No impingement. C7-T1: Slight bulge. Moderate RIGHT and mild LEFT facet arthropathy. No impingement. CT LUMBAR MYELOGRAM FINDINGS: L1-L2:  Normal. L2-L3:  Normal. L3-L4:  Osseous ridging.  Mild facet arthropathy.  No impingement. L4-L5: Osseous ridging. Mild facet arthropathy. Annular bulging. No significant stenosis or subarticular zone/foraminal zone narrowing. L5-S1: Severe disc space narrowing. Diffuse osseous ridging. Regrowth and filling in of previous laminotomy. LEFT greater than RIGHT facet arthropathy with annular bulging. Prominent ventral epidural fat. No subarticular zone narrowing affecting the S1 nerve roots but BILATERAL foraminal narrowing, much worse on the LEFT, associated with a leftward extraforaminal protrusion (image 59 series 3) with vacuum phenomenon, contributes to LEFT greater than RIGHT L5 nerve root impingement. IMPRESSION: CERVICAL: Status post C5-C7 fusion with solid arthrodesis. No dynamic instability. 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 Severe spinal stenosis is not established. Asymmetric foraminal narrowing at C3-4 on the LEFT could contribute to some LEFT-sided radicular symptoms. LUMBAR: Status post L5-S1 discectomy. No dynamic instability although mild annular bulging at L4-5 develops with the patient standing. Severe disc space narrowing at L5-S1. Extraforaminal protrusion at L5-S1 on the LEFT contributes to LEFT greater than RIGHT L5 nerve root impingement in conjunction with diffuse osseous ridging. Electronically Signed   By: Staci Righter M.D.   On: 09/26/2015 14:46       Assessment & Plan:   Problem List Items Addressed This Visit    Carotid stenosis    Carotid ultrasound <50% right internal carotid artery stenosis and >70% left internal carotid artery stenosis.  Was seen 11/2015.  Recommended f/u with Dr Delana Meyer in  6 months after last check.        Hypercholesteremia    Low cholesterol diet and exercise.  On crestor.  Follow lipid panel and liver function tests.        Relevant Orders   Lipid panel   Hepatic function panel   Hypertension - Primary    Blood pressure elevated today.  Increased on my check.  Will stop meloxicam.  See if blood pressure improves.  Continue current medication regimen.  Follow pressures.  Get her back in soon to reassess.        Relevant Orders   Basic metabolic panel   Leg wound, left    Persistent.  Leave open.  Hold on topical medication.  Refer to dermatology given persistent non healing lesion.       Relevant Orders   Ambulatory referral to Dermatology   Right shoulder pain    S/p cervical decompression.  Will stop meloxicam.  Follow pressures.  Tramadol as needed.            Einar Pheasant, MD

## 2016-03-31 ENCOUNTER — Encounter: Payer: Self-pay | Admitting: Internal Medicine

## 2016-03-31 NOTE — Assessment & Plan Note (Signed)
Carotid ultrasound <50% right internal carotid artery stenosis and >70% left internal carotid artery stenosis.  Was seen 11/2015.  Recommended f/u with Dr Delana Meyer in 6 months after last check.

## 2016-03-31 NOTE — Assessment & Plan Note (Signed)
Blood pressure elevated today.  Increased on my check.  Will stop meloxicam.  See if blood pressure improves.  Continue current medication regimen.  Follow pressures.  Get her back in soon to reassess.

## 2016-03-31 NOTE — Assessment & Plan Note (Signed)
Low cholesterol diet and exercise.  On crestor.  Follow lipid panel and liver function tests.  

## 2016-03-31 NOTE — Assessment & Plan Note (Signed)
S/p cervical decompression.  Will stop meloxicam.  Follow pressures.  Tramadol as needed.

## 2016-03-31 NOTE — Assessment & Plan Note (Signed)
Persistent.  Leave open.  Hold on topical medication.  Refer to dermatology given persistent non healing lesion.

## 2016-05-24 ENCOUNTER — Other Ambulatory Visit (INDEPENDENT_AMBULATORY_CARE_PROVIDER_SITE_OTHER): Payer: Medicare Other

## 2016-05-24 DIAGNOSIS — I10 Essential (primary) hypertension: Secondary | ICD-10-CM

## 2016-05-24 DIAGNOSIS — E7800 Pure hypercholesterolemia, unspecified: Secondary | ICD-10-CM | POA: Diagnosis not present

## 2016-05-24 LAB — BASIC METABOLIC PANEL
BUN: 14 mg/dL (ref 6–23)
CO2: 29 mEq/L (ref 19–32)
Calcium: 9.4 mg/dL (ref 8.4–10.5)
Chloride: 101 mEq/L (ref 96–112)
Creatinine, Ser: 0.71 mg/dL (ref 0.40–1.20)
GFR: 86.73 mL/min (ref >60.00)
Glucose, Bld: 103 mg/dL — ABNORMAL HIGH (ref 70–99)
Potassium: 3.8 mEq/L (ref 3.5–5.1)
Sodium: 137 mEq/L (ref 135–145)

## 2016-05-24 LAB — HEPATIC FUNCTION PANEL
ALT: 23 U/L (ref 0–35)
AST: 18 U/L (ref 0–37)
Albumin: 4.1 g/dL (ref 3.5–5.2)
Alkaline Phosphatase: 58 U/L (ref 39–117)
Bilirubin, Direct: 0.1 mg/dL (ref 0.0–0.3)
Total Bilirubin: 1 mg/dL (ref 0.2–1.2)
Total Protein: 6.9 g/dL (ref 6.0–8.3)

## 2016-05-24 LAB — LIPID PANEL
Cholesterol: 186 mg/dL (ref 0–200)
HDL: 50.9 mg/dL (ref >39.00)
LDL Cholesterol: 106 mg/dL — ABNORMAL HIGH (ref 0–99)
NonHDL: 134.66
Total CHOL/HDL Ratio: 4
Triglycerides: 145 mg/dL (ref 0.0–149.0)
VLDL: 29 mg/dL (ref 0.0–40.0)

## 2016-05-28 ENCOUNTER — Ambulatory Visit (INDEPENDENT_AMBULATORY_CARE_PROVIDER_SITE_OTHER): Payer: Medicare Other | Admitting: Internal Medicine

## 2016-05-28 ENCOUNTER — Encounter: Payer: Self-pay | Admitting: Internal Medicine

## 2016-05-28 DIAGNOSIS — I6523 Occlusion and stenosis of bilateral carotid arteries: Secondary | ICD-10-CM

## 2016-05-28 DIAGNOSIS — M545 Low back pain, unspecified: Secondary | ICD-10-CM

## 2016-05-28 DIAGNOSIS — E7800 Pure hypercholesterolemia, unspecified: Secondary | ICD-10-CM | POA: Diagnosis not present

## 2016-05-28 DIAGNOSIS — I10 Essential (primary) hypertension: Secondary | ICD-10-CM | POA: Diagnosis not present

## 2016-05-28 DIAGNOSIS — Z658 Other specified problems related to psychosocial circumstances: Secondary | ICD-10-CM

## 2016-05-28 DIAGNOSIS — F439 Reaction to severe stress, unspecified: Secondary | ICD-10-CM

## 2016-05-28 NOTE — Progress Notes (Signed)
Pre visit review using our clinic review tool, if applicable. No additional management support is needed unless otherwise documented below in the visit note. 

## 2016-05-28 NOTE — Progress Notes (Signed)
Patient ID: Kristen Strickland, female   DOB: 04/13/1946, 70 y.o.   MRN: [id number]   Subjective:    Patient ID: Kristen Strickland, female    DOB: Feb 08, 1946, 70 y.o.   MRN: [id number]  HPI  Patient here for a scheduled follow up.  Her daughter just recently passed unexpectedly.  Had MI.  She feels she is handling things relatively well.  Has good support.  Does not feel she needs anything more at this time.  Will let me know.  Stays active.  No chest pain.  No sob.  No acid reflux.  No abdominal pain  Bowels stable.  Discussed lab results.  She is having increased pain in her back.  Plans to call her surgeon and discuss.  Taking tramadol.  Was having problems with elevated blood pressure.  Felt may be worsened by meloxicam.  She is off now.     Past Medical History:  Diagnosis Date  . Arthritis   . Hypercholesterolemia   . Hypertension    Past Surgical History:  Procedure Laterality Date  . ABDOMINAL HYSTERECTOMY  1975  . ANTERIOR CERVICAL DECOMP/DISCECTOMY FUSION N/A 04/09/2013   Procedure: ANTERIOR CERVICAL DECOMPRESSION/DISCECTOMY FUSION 2 LEVELS;  Surgeon: Floyce Stakes, MD;  Location: [facility];  Service: Neurosurgery;  Laterality: N/A;  Cervical five-six Cervical six-seven  Anterior cervical decompression/diskectomy/fusion  . BACK SURGERY     ruptured disc  . RECONSTRUCTION OF NOSE     Family History  Problem Relation Age of Onset  . Cancer Mother     ovarian/uterine  . Heart disease Mother   . Colon cancer Father    Social History   Social History  . Marital status: Married    Spouse name: N/A  . Number of children: 2  . Years of education: N/A   Social History Main Topics  . Smoking status: Never Smoker  . Smokeless tobacco: Never Used  . Alcohol use 0.0 oz/week     Comment: rarely  . Drug use: No  . Sexual activity: Yes   Other Topics Concern  . None   Social History Narrative  . None    Outpatient Encounter Prescriptions as of 05/28/2016  Medication Sig    . aspirin EC 81 MG tablet Take 81 mg by mouth daily.  Marland Kitchen estradiol (CLIMARA - DOSED IN MG/24 HR) 0.1 mg/24hr patch APPLY 1 PATCH ON SKIN ONCE WEEKLY.  Marland Kitchen losartan-hydrochlorothiazide (HYZAAR) 100-25 MG tablet TAKE 1 TABLET BY MOUTH ONCE DAILY.  . Multiple Vitamins-Minerals (CENTRUM PO) Take 1 tablet by mouth daily.   . rosuvastatin (CRESTOR) 5 MG tablet TAKE 1 TABLET BY MOUTH ONCE DAILY.  . traMADol (ULTRAM) 50 MG tablet Take 1 tablet (50 mg total) by mouth 2 (two) times daily as needed.  . valACYclovir (VALTREX) 500 MG tablet Take 1 tablet (500 mg total) by mouth 2 (two) times daily as needed. (Patient not taking: Reported on 05/28/2016)  . [DISCONTINUED] fluticasone (FLONASE) 50 MCG/ACT nasal spray Place 2 sprays into both nostrils daily. (Patient not taking: Reported on 03/29/2016)  . [DISCONTINUED] ibuprofen (ADVIL,MOTRIN) 200 MG tablet Take 200 mg by mouth every 8 (eight) hours as needed for pain, fever or headache. Reported on 03/29/2016  . [DISCONTINUED] loratadine (CLARITIN) 10 MG tablet Take 1 tablet (10 mg total) by mouth daily.  . [DISCONTINUED] meloxicam (MOBIC) 15 MG tablet   . [DISCONTINUED] triamcinolone cream (KENALOG) 0.1 % Apply 1 application topically 2 (two) times daily.  . [DISCONTINUED] Wound Dressings (MEDIHONEY  WOUND/BURN DRESSING) PSTE Apply 1 application topically daily.   No facility-administered encounter medications on file as of 05/28/2016.     Review of Systems  Constitutional: Negative for appetite change and unexpected weight change.  HENT: Negative for congestion and sinus pressure.   Respiratory: Negative for cough, chest tightness and shortness of breath.   Cardiovascular: Negative for chest pain, palpitations and leg swelling.  Gastrointestinal: Negative for abdominal pain, diarrhea, nausea and vomiting.  Musculoskeletal: Positive for back pain. Negative for joint swelling.  Skin: Negative for color change and rash.  Neurological: Negative for dizziness,  light-headedness and headaches.  Psychiatric/Behavioral: Negative for agitation.       Increased stress dealing with her daughter's death.         Objective:    Physical Exam  Constitutional: She appears well-developed and well-nourished. No distress.  HENT:  Nose: Nose normal.  Mouth/Throat: Oropharynx is clear and moist.  Neck: Neck supple. No thyromegaly present.  Cardiovascular: Normal rate and regular rhythm.   Pulmonary/Chest: Breath sounds normal. No respiratory distress. She has no wheezes.  Abdominal: Soft. Bowel sounds are normal. There is no tenderness.  Musculoskeletal: She exhibits no edema or tenderness.  Lymphadenopathy:    She has no cervical adenopathy.  Skin: No rash noted. No erythema.  Psychiatric: She has a normal mood and affect. Her behavior is normal.    BP 136/78   Pulse 64   Temp 97.7 F (36.5 C)   Resp 12   Wt 152 lb (68.9 kg)   BMI 26.09 kg/m  Wt Readings from Last 3 Encounters:  05/28/16 152 lb (68.9 kg)  03/29/16 159 lb (72.1 kg)  03/12/16 157 lb 12.8 oz (71.6 kg)     Lab Results  Component Value Date   WBC 5.5 01/12/2016   HGB 12.8 01/12/2016   HCT 38.2 01/12/2016   PLT 251.0 01/12/2016   GLUCOSE 103 (H) 05/24/2016   CHOL 186 05/24/2016   TRIG 145.0 05/24/2016   HDL 50.90 05/24/2016   LDLDIRECT 151.7 08/16/2013   LDLCALC 106 (H) 05/24/2016   ALT 23 05/24/2016   AST 18 05/24/2016   NA 137 05/24/2016   K 3.8 05/24/2016   CL 101 05/24/2016   CREATININE 0.71 05/24/2016   BUN 14 05/24/2016   CO2 29 05/24/2016   TSH 3.25 08/08/2015    Ct Cervical Spine W Contrast  Result Date: 09/26/2015 CLINICAL DATA:  Low back pain. Prior lumbar discectomy 30 years ago. Neck pain. Restriction of neck motion, and deltoid weakness. Previous cervical fusion. 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 FLUOROSCOPY TIME:  1 minutes and 8 seconds. PROCEDURE: LUMBAR PUNCTURE FOR CERVICAL LUMBAR  MYELOGRAM CERVICAL AND LUMBAR MYELOGRAM CT CERVICAL MYELOGRAM CT  LUMBAR MYELOGRAM After thorough discussion of risks and benefits of the procedure including bleeding, infection, injury to nerves, blood vessels, adjacent structures as well as headache and CSF leak, written and oral informed consent was obtained. Consent was obtained by Dr. Rolla Flatten. Patient was positioned prone on the fluoroscopy table. Local anesthesia was provided with 1% lidocaine without epinephrine after prepped and draped in the usual sterile fashion. Puncture was performed at L3-L4 using a 3 1/2 inch 22-gauge spinal needle via midline approach. Using a single pass through the dura, the needle was placed within the thecal sac, with return of clear CSF. 10 mL Omnipaque-300 was injected into the thecal sac, with normal opacification of the nerve roots and cauda equina consistent with free flow within the subarachnoid space. The patient was  then moved to the trendelenburg position and contrast flowed into the Thoracic and Cervical spine regions. I personally performed the lumbar puncture and administered the intrathecal contrast. I also personally supervised acquisition of the myelogram images. TECHNIQUE: Contiguous axial images were obtained through the Cervical, Thoracic, and Lumbar spine after the intrathecal infusion of infusion. Coronal and sagittal reconstructions were obtained of the axial image sets. FINDINGS: CERVICAL AND LUMBAR MYELOGRAM FINDINGS: LUMBAR:  Good opacification lumbar subarachnoid space. Waist like narrowing at L5-S1 relates to prominent epidural fat, ventral osseous spurring, and posterior element hypertrophy. Severe disc space narrowing at the L5-S1 level is observed, status post lumbar discectomy. Anatomic alignment. Other intervertebral disc spaces are preserved. No frank S1 nerve root cut off. With patient standing, anatomic alignment is preserved, without dynamic instability. Increased bulging of the L4-5 disc is present with the patient upright. CERVICAL: Good opacification of  the cervical subarachnoid space. Previous C5 through C7 fusion via ACDF with anterior plating. Minor disc disease at C3-4 and C4-5 with calcified protrusions new effacing the anterior subarachnoid space, but no significant stenosis or cord compression. Hardware intact. Anatomic alignment. No dynamic instability on flexion extension CT CERVICAL MYELOGRAM FINDINGS: Alignment: Anatomic Vertebrae: No worrisome osseous lesions. Solid arthrodesis C5 through C7. Cord: No cord compression. Effacement anterior subarachnoid space with slight stenosis at C3-4 and C4-5. Posterior Fossa: No tonsillar herniation. Paraspinal tissues: No neck masses. Lung apices clear. Minor atherosclerosis. Disc levels: The individual disc spaces were examined as follows: C2-3:  Normal. C3-4: Calcified central protrusion. Mild effacement anterior subarachnoid space and minimal cord deformity. LEFT-sided facet arthropathy. LEFT-sided uncinate spurring results in mild effacement of the LEFT C4 nerve root. C4-5: Calcified central protrusion. Mild effacement anterior subarachnoid space and minimal cord deformity. LEFT greater than RIGHT uncinate spurring. Mild facet arthropathy. No definite C5 nerve root impingement. C5-6:  Solid fusion.  No impingement. C6-7:  Solid fusion.  No impingement. C7-T1: Slight bulge. Moderate RIGHT and mild LEFT facet arthropathy. No impingement. CT LUMBAR MYELOGRAM FINDINGS: L1-L2:  Normal. L2-L3:  Normal. L3-L4:  Osseous ridging.  Mild facet arthropathy.  No impingement. L4-L5: Osseous ridging. Mild facet arthropathy. Annular bulging. No significant stenosis or subarticular zone/foraminal zone narrowing. L5-S1: Severe disc space narrowing. Diffuse osseous ridging. Regrowth and filling in of previous laminotomy. LEFT greater than RIGHT facet arthropathy with annular bulging. Prominent ventral epidural fat. No subarticular zone narrowing affecting the S1 nerve roots but BILATERAL foraminal narrowing, much worse on the  LEFT, associated with a leftward extraforaminal protrusion (image 59 series 3) with vacuum phenomenon, contributes to LEFT greater than RIGHT L5 nerve root impingement. IMPRESSION: CERVICAL: Status post C5-C7 fusion with solid arthrodesis. No dynamic instability. 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 Severe spinal stenosis is not established. Asymmetric foraminal narrowing at C3-4 on the LEFT could contribute to some LEFT-sided radicular symptoms. LUMBAR: Status post L5-S1 discectomy. No dynamic instability although mild annular bulging at L4-5 develops with the patient standing. Severe disc space narrowing at L5-S1. Extraforaminal protrusion at L5-S1 on the LEFT contributes to LEFT greater than RIGHT L5 nerve root impingement in conjunction with diffuse osseous ridging. Electronically Signed   By: Staci Righter M.D.   On: 09/26/2015 14:46   Ct Lumbar Spine W Contrast  Result Date: 09/26/2015 CLINICAL DATA:  Low back pain. Prior lumbar discectomy 30 years ago. Neck pain. Restriction of neck motion, and deltoid weakness. Previous cervical fusion. Possible adjacent  segment disease. FLUOROSCOPY TIME:  1 minutes and 8 seconds. PROCEDURE: LUMBAR PUNCTURE FOR CERVICAL LUMBAR  MYELOGRAM CERVICAL AND LUMBAR MYELOGRAM CT CERVICAL MYELOGRAM CT LUMBAR MYELOGRAM After thorough discussion of risks and benefits of the procedure including bleeding, infection, injury to nerves, blood vessels, adjacent structures as well as headache and CSF leak, written and oral informed consent was obtained. Consent was obtained by Dr. Rolla Flatten. Patient was positioned prone on the fluoroscopy table. Local anesthesia was provided with 1% lidocaine without epinephrine after prepped and draped in the usual sterile fashion. Puncture was performed at L3-L4 using a 3 1/2 inch 22-gauge spinal needle via midline approach. Using a single pass through  the dura, the needle was placed within the thecal sac, with return of clear CSF. 10 mL Omnipaque-300 was injected into the thecal sac, with normal opacification of the nerve roots and cauda equina consistent with free flow within the subarachnoid space. The patient was then moved to the trendelenburg position and contrast flowed into the Thoracic and Cervical spine regions. I personally performed the lumbar puncture and administered the intrathecal contrast. I also personally supervised acquisition of the myelogram images. TECHNIQUE: Contiguous axial images were obtained through the Cervical, Thoracic, and Lumbar spine after the intrathecal infusion of infusion. Coronal and sagittal reconstructions were obtained of the axial image sets. FINDINGS: CERVICAL AND LUMBAR MYELOGRAM FINDINGS: LUMBAR:  Good opacification lumbar subarachnoid space. Waist like narrowing at L5-S1 relates to prominent epidural fat, ventral osseous spurring, and posterior element hypertrophy. Severe disc space narrowing at the L5-S1 level is observed, status post lumbar discectomy. Anatomic alignment. Other intervertebral disc spaces are preserved. No frank S1 nerve root cut off. With patient standing, anatomic alignment is preserved, without dynamic instability. Increased bulging of the L4-5 disc is present with the patient upright. CERVICAL: Good opacification of the cervical subarachnoid space. Previous C5 through C7 fusion via ACDF with anterior plating. Minor disc disease at C3-4 and C4-5 with calcified protrusions new effacing the anterior subarachnoid space, but no significant stenosis or cord compression. Hardware intact. Anatomic alignment. No dynamic instability on flexion extension CT CERVICAL MYELOGRAM FINDINGS: Alignment: Anatomic Vertebrae: No worrisome osseous lesions. Solid arthrodesis C5 through C7. Cord: No cord compression. Effacement anterior subarachnoid space with slight stenosis at C3-4 and C4-5. Posterior Fossa: No  tonsillar herniation. Paraspinal tissues: No neck masses. Lung apices clear. Minor atherosclerosis. Disc levels: The individual disc spaces were examined as follows: C2-3:  Normal. C3-4: Calcified central protrusion. Mild effacement anterior subarachnoid space and minimal cord deformity. LEFT-sided facet arthropathy. LEFT-sided uncinate spurring results in mild effacement of the LEFT C4 nerve root. C4-5: Calcified central protrusion. Mild effacement anterior subarachnoid space and minimal cord deformity. LEFT greater than RIGHT uncinate spurring. Mild facet arthropathy. No definite C5 nerve root impingement. C5-6:  Solid fusion.  No impingement. C6-7:  Solid fusion.  No impingement. C7-T1: Slight bulge. Moderate RIGHT and mild LEFT facet arthropathy. No impingement. CT LUMBAR MYELOGRAM FINDINGS: L1-L2:  Normal. L2-L3:  Normal. L3-L4:  Osseous ridging.  Mild facet arthropathy.  No impingement. L4-L5: Osseous ridging. Mild facet arthropathy. Annular bulging. No significant stenosis or subarticular zone/foraminal zone narrowing. L5-S1: Severe disc space narrowing. Diffuse osseous ridging. Regrowth and filling in of previous laminotomy. LEFT greater than RIGHT facet arthropathy with annular bulging. Prominent ventral epidural fat. No subarticular zone narrowing affecting the S1 nerve roots but BILATERAL foraminal narrowing, much worse on the LEFT, associated with a leftward extraforaminal protrusion (image 59 series 3) with vacuum phenomenon, contributes  to LEFT greater than RIGHT L5 nerve root impingement. IMPRESSION: CERVICAL: Status post C5-C7 fusion with solid arthrodesis. No dynamic instability. 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 Severe spinal stenosis is not established. Asymmetric foraminal narrowing at C3-4 on the LEFT could contribute to some LEFT-sided radicular symptoms. LUMBAR: Status post L5-S1 discectomy.  No dynamic instability although mild annular bulging at L4-5 develops with the patient standing. Severe disc space narrowing at L5-S1. Extraforaminal protrusion at L5-S1 on the LEFT contributes to LEFT greater than RIGHT L5 nerve root impingement in conjunction with diffuse osseous ridging. Electronically Signed   By: Staci Righter M.D.   On: 09/26/2015 14:46   Dg Myelography Lumbar Inj Multi Region  Result Date: 09/26/2015 CLINICAL DATA:  Low back pain. Prior lumbar discectomy 30 years ago. Neck pain. Restriction of neck motion, and deltoid weakness. Previous cervical fusion. 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 FLUOROSCOPY TIME:  1 minutes and 8 seconds. PROCEDURE: LUMBAR PUNCTURE FOR CERVICAL LUMBAR  MYELOGRAM CERVICAL AND LUMBAR MYELOGRAM CT CERVICAL MYELOGRAM CT LUMBAR MYELOGRAM After thorough discussion of risks and benefits of the procedure including bleeding, infection, injury to nerves, blood vessels, adjacent structures as well as headache and CSF leak, written and oral informed consent was obtained. Consent was obtained by Dr. Rolla Flatten. Patient was positioned prone on the fluoroscopy table. Local anesthesia was provided with 1% lidocaine without epinephrine after prepped and draped in the usual sterile fashion. Puncture was performed at L3-L4 using a 3 1/2 inch 22-gauge spinal needle via midline approach. Using a single pass through the dura, the needle was placed within the thecal sac, with return of clear CSF. 10 mL Omnipaque-300 was injected into the thecal sac, with normal opacification of the nerve roots and cauda equina consistent with free flow within the subarachnoid space. The patient was then moved to the trendelenburg position and contrast flowed into the Thoracic and Cervical spine regions. I personally performed the lumbar puncture and administered the intrathecal contrast. I also personally supervised acquisition of the myelogram images. TECHNIQUE: Contiguous axial images were obtained  through the Cervical, Thoracic, and Lumbar spine after the intrathecal infusion of infusion. Coronal and sagittal reconstructions were obtained of the axial image sets. FINDINGS: CERVICAL AND LUMBAR MYELOGRAM FINDINGS: LUMBAR:  Good opacification lumbar subarachnoid space. Waist like narrowing at L5-S1 relates to prominent epidural fat, ventral osseous spurring, and posterior element hypertrophy. Severe disc space narrowing at the L5-S1 level is observed, status post lumbar discectomy. Anatomic alignment. Other intervertebral disc spaces are preserved. No frank S1 nerve root cut off. With patient standing, anatomic alignment is preserved, without dynamic instability. Increased bulging of the L4-5 disc is present with the patient upright. CERVICAL: Good opacification of the cervical subarachnoid space. Previous C5 through C7 fusion via ACDF with anterior plating. Minor disc disease at C3-4 and C4-5 with calcified protrusions new effacing the anterior subarachnoid space, but no significant stenosis or cord compression. Hardware intact. Anatomic alignment. No dynamic instability on flexion extension CT CERVICAL MYELOGRAM FINDINGS: Alignment: Anatomic Vertebrae: No worrisome osseous lesions. Solid arthrodesis C5 through C7. Cord: No cord compression. Effacement anterior subarachnoid space with slight stenosis at C3-4 and C4-5. Posterior Fossa: No tonsillar herniation. Paraspinal tissues: No neck masses. Lung apices clear. Minor atherosclerosis. Disc levels: The individual disc spaces were examined as follows: C2-3:  Normal. C3-4: Calcified central protrusion. Mild effacement anterior subarachnoid space and minimal cord deformity. LEFT-sided facet arthropathy. LEFT-sided uncinate  spurring results in mild effacement of the LEFT C4 nerve root. C4-5: Calcified central protrusion. Mild effacement anterior subarachnoid space and minimal cord deformity. LEFT greater than RIGHT uncinate spurring. Mild facet arthropathy. No  definite C5 nerve root impingement. C5-6:  Solid fusion.  No impingement. C6-7:  Solid fusion.  No impingement. C7-T1: Slight bulge. Moderate RIGHT and mild LEFT facet arthropathy. No impingement. CT LUMBAR MYELOGRAM FINDINGS: L1-L2:  Normal. L2-L3:  Normal. L3-L4:  Osseous ridging.  Mild facet arthropathy.  No impingement. L4-L5: Osseous ridging. Mild facet arthropathy. Annular bulging. No significant stenosis or subarticular zone/foraminal zone narrowing. L5-S1: Severe disc space narrowing. Diffuse osseous ridging. Regrowth and filling in of previous laminotomy. LEFT greater than RIGHT facet arthropathy with annular bulging. Prominent ventral epidural fat. No subarticular zone narrowing affecting the S1 nerve roots but BILATERAL foraminal narrowing, much worse on the LEFT, associated with a leftward extraforaminal protrusion (image 59 series 3) with vacuum phenomenon, contributes to LEFT greater than RIGHT L5 nerve root impingement. IMPRESSION: CERVICAL: Status post C5-C7 fusion with solid arthrodesis. No dynamic instability. 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 Severe spinal stenosis is not established. Asymmetric foraminal narrowing at C3-4 on the LEFT could contribute to some LEFT-sided radicular symptoms. LUMBAR: Status post L5-S1 discectomy. No dynamic instability although mild annular bulging at L4-5 develops with the patient standing. Severe disc space narrowing at L5-S1. Extraforaminal protrusion at L5-S1 on the LEFT contributes to LEFT greater than RIGHT L5 nerve root impingement in conjunction with diffuse osseous ridging. Electronically Signed   By: Staci Righter M.D.   On: 09/26/2015 14:46       Assessment & Plan:   Problem List Items Addressed This Visit    Back pain    MRI as outlined.  Saw [facility] - Dr Joya Salm.  Off meloxicam.  Taking tramadol.  This helps.  Only takes one per day.  Increased pain.   Plans to discuss with Dr Joya Salm.        Carotid stenosis    Carotid ultrasound <50% right internal carotid artery stenosis and >70% left internal carotid artery stenosis.  Was evalauted 11/2015.  Has f/u planned soon.  Sees Dr Delana Meyer.  Continue aspirin and risk factor modification.       Hypercholesteremia    On crestor now.  Tolerating.  Low cholesterol diet and exercise.  Follow lipid panel and liver function tests.   Lab Results  Component Value Date   CHOL 186 05/24/2016   HDL 50.90 05/24/2016   LDLCALC 106 (H) 05/24/2016   LDLDIRECT 151.7 08/16/2013   TRIG 145.0 05/24/2016   CHOLHDL 4 05/24/2016        Relevant Orders   Lipid panel   Hepatic function panel   Hypertension    Blood pressure doing better.  Continue current medication regimen.  Remain off meloxicam.        Relevant Orders   TSH   Basic metabolic panel   Stress    Increased stress with daughter's unexpected death.  Discussed with her today.  She does not feel needs anything more at this time.  Follow.         Other Visit Diagnoses   None.      Einar Pheasant, MD

## 2016-05-29 ENCOUNTER — Encounter: Payer: Self-pay | Admitting: Internal Medicine

## 2016-05-29 NOTE — Assessment & Plan Note (Signed)
On crestor now.  Tolerating.  Low cholesterol diet and exercise.  Follow lipid panel and liver function tests.   Lab Results  Component Value Date   CHOL 186 05/24/2016   HDL 50.90 05/24/2016   LDLCALC 106 (H) 05/24/2016   LDLDIRECT 151.7 08/16/2013   TRIG 145.0 05/24/2016   CHOLHDL 4 05/24/2016

## 2016-05-29 NOTE — Assessment & Plan Note (Addendum)
Carotid ultrasound <50% right internal carotid artery stenosis and >70% left internal carotid artery stenosis.  Was evalauted 11/2015.  Has f/u planned soon.  Sees Dr Delana Meyer.  Continue aspirin and risk factor modification.

## 2016-05-29 NOTE — Assessment & Plan Note (Signed)
Blood pressure doing better.  Continue current medication regimen.  Remain off meloxicam.

## 2016-05-29 NOTE — Assessment & Plan Note (Signed)
Increased stress with daughter's unexpected death.  Discussed with her today.  She does not feel needs anything more at this time.  Follow.

## 2016-05-29 NOTE — Assessment & Plan Note (Signed)
MRI as outlined.  Saw [facility] - Dr Joya Salm.  Off meloxicam.  Taking tramadol.  This helps.  Only takes one per day.  Increased pain.  Plans to discuss with Dr Joya Salm.

## 2016-06-03 ENCOUNTER — Other Ambulatory Visit: Payer: Self-pay | Admitting: Internal Medicine

## 2016-06-03 DIAGNOSIS — I6523 Occlusion and stenosis of bilateral carotid arteries: Secondary | ICD-10-CM | POA: Diagnosis not present

## 2016-06-03 DIAGNOSIS — M5431 Sciatica, right side: Secondary | ICD-10-CM | POA: Diagnosis not present

## 2016-06-03 DIAGNOSIS — R0989 Other specified symptoms and signs involving the circulatory and respiratory systems: Secondary | ICD-10-CM | POA: Diagnosis not present

## 2016-06-03 DIAGNOSIS — M549 Dorsalgia, unspecified: Secondary | ICD-10-CM | POA: Diagnosis not present

## 2016-06-03 DIAGNOSIS — E785 Hyperlipidemia, unspecified: Secondary | ICD-10-CM | POA: Diagnosis not present

## 2016-06-03 DIAGNOSIS — M5432 Sciatica, left side: Secondary | ICD-10-CM | POA: Diagnosis not present

## 2016-06-03 DIAGNOSIS — I10 Essential (primary) hypertension: Secondary | ICD-10-CM | POA: Diagnosis not present

## 2016-06-03 DIAGNOSIS — M542 Cervicalgia: Secondary | ICD-10-CM | POA: Diagnosis not present

## 2016-06-17 DIAGNOSIS — I10 Essential (primary) hypertension: Secondary | ICD-10-CM | POA: Diagnosis not present

## 2016-06-17 DIAGNOSIS — M5416 Radiculopathy, lumbar region: Secondary | ICD-10-CM | POA: Diagnosis not present

## 2016-06-17 DIAGNOSIS — M542 Cervicalgia: Secondary | ICD-10-CM | POA: Diagnosis not present

## 2016-06-17 DIAGNOSIS — Z6825 Body mass index (BMI) 25.0-25.9, adult: Secondary | ICD-10-CM | POA: Diagnosis not present

## 2016-06-18 ENCOUNTER — Other Ambulatory Visit: Payer: Self-pay | Admitting: Internal Medicine

## 2016-06-21 ENCOUNTER — Other Ambulatory Visit: Payer: Self-pay | Admitting: Neurosurgery

## 2016-06-21 DIAGNOSIS — M5416 Radiculopathy, lumbar region: Secondary | ICD-10-CM

## 2016-06-26 ENCOUNTER — Ambulatory Visit
Admission: RE | Admit: 2016-06-26 | Discharge: 2016-06-26 | Disposition: A | Discharge status: Home / Self Care | Payer: Medicare Other | Source: Ambulatory Visit | Attending: Neurosurgery | Admitting: Neurosurgery

## 2016-06-26 DIAGNOSIS — M5416 Radiculopathy, lumbar region: Secondary | ICD-10-CM

## 2016-06-26 MED ORDER — METHYLPREDNISOLONE ACETATE 40 MG/ML INJ SUSP (RADIOLOG
120.0000 mg | Freq: Once | INTRAMUSCULAR | Status: AC
  Administered 2016-06-26: 120 mg via EPIDURAL

## 2016-06-26 MED ORDER — IOPAMIDOL (ISOVUE-M 200) INJECTION 41%
1.0000 mL | Freq: Once | INTRAMUSCULAR | Status: AC
  Administered 2016-06-26: 1 mL via EPIDURAL

## 2016-06-26 NOTE — Discharge Instructions (Signed)

## 2016-07-22 IMAGING — CT CT CERVICAL SPINE W/ CM
2 series · 10 of 14 positions shown, 12 images · non-contrast
Comparison: none

CLINICAL DATA: Low back pain. Prior lumbar discectomy 30 years ago.
TECHNIQUE: Contiguous axial images were obtained through the Cervical,
Thoracic, and Lumbar spine after the intrathecal infusion of
infusion. Coronal and sagittal reconstructions were obtained of the
axial image sets.

[Series 2: cspine soft · axial · 0.25mm/px · z∈[+673,+781]mm · 5 of 82 slices shown]
[im 14/82  soft-tissue]
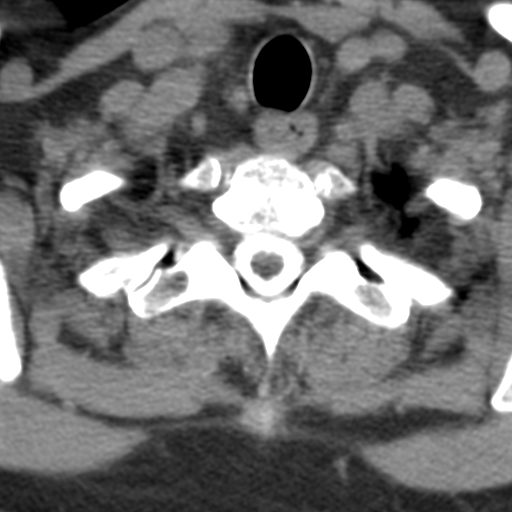
[im 28/82  soft-tissue]
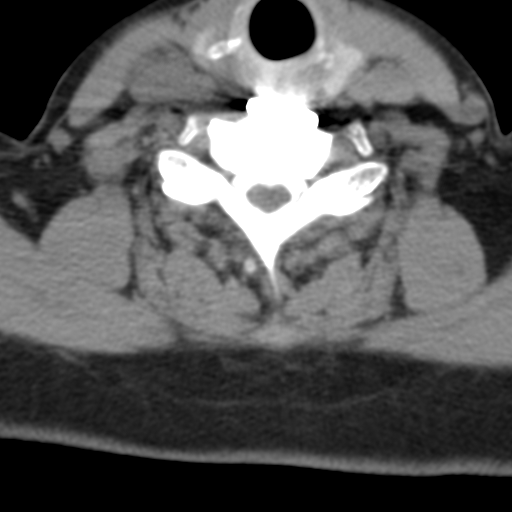
[im 41/82  soft-tissue]
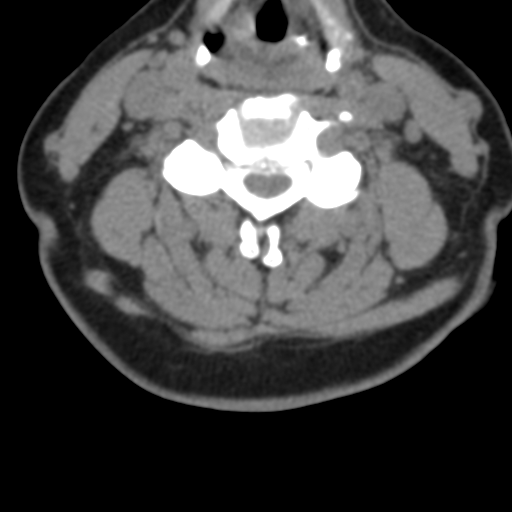
[im 55/82  soft-tissue]
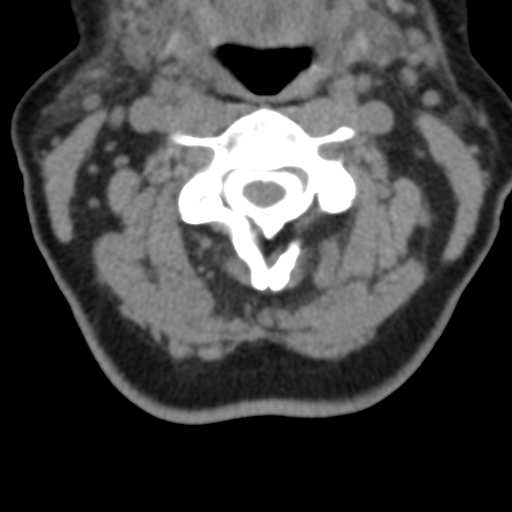
[im 68/82  soft-tissue]
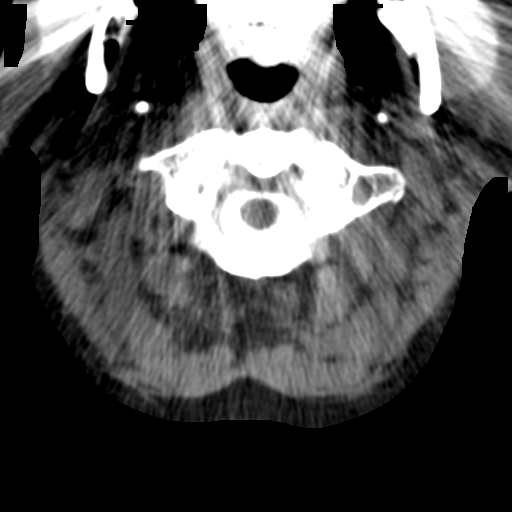

[Series 8: angled axial · axial · 0.25mm/px · z∈[+653,+769]mm · 5 of 89 slices shown, 7 images]
[im 15/89  soft-tissue]
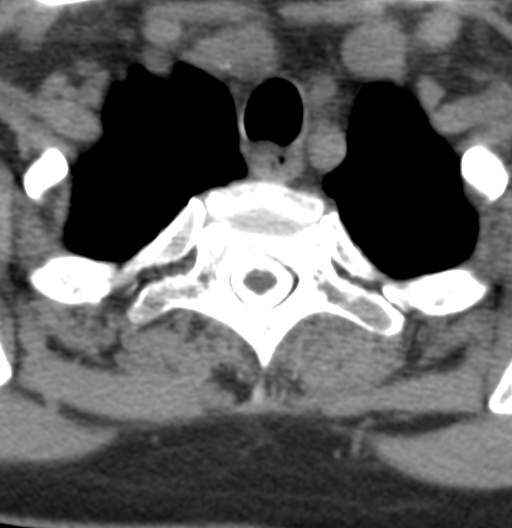
[im 15/89  bone]
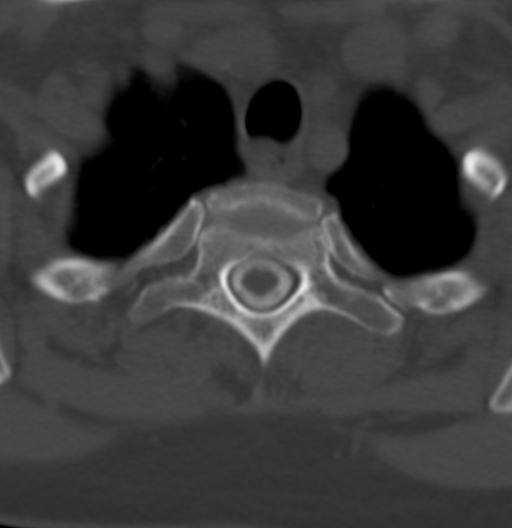
[im 30/89  bone]
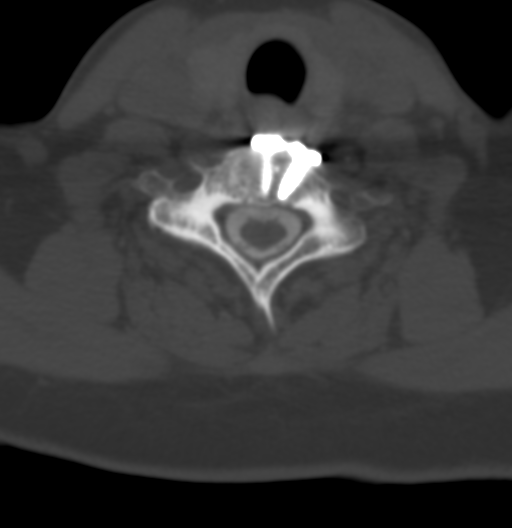
[im 45/89  bone]
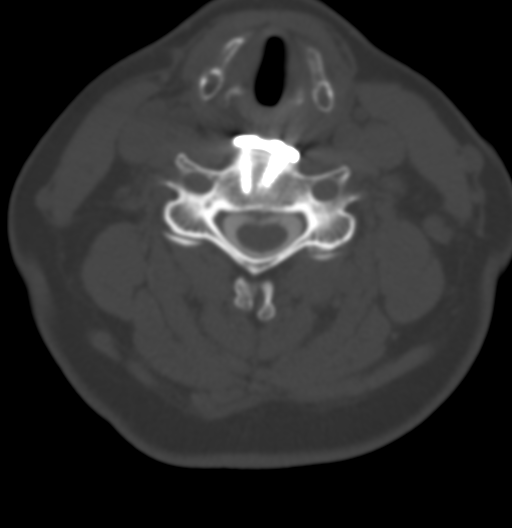
[im 59/89  bone]
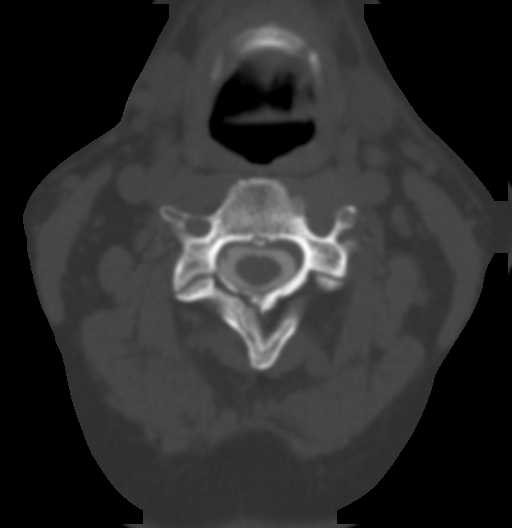
[im 74/89  soft-tissue]
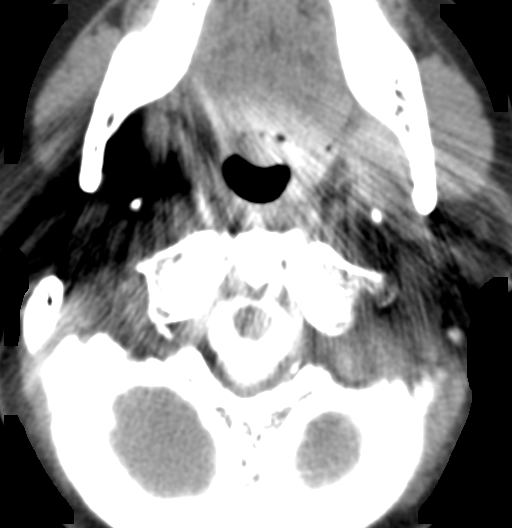
[im 74/89  bone]
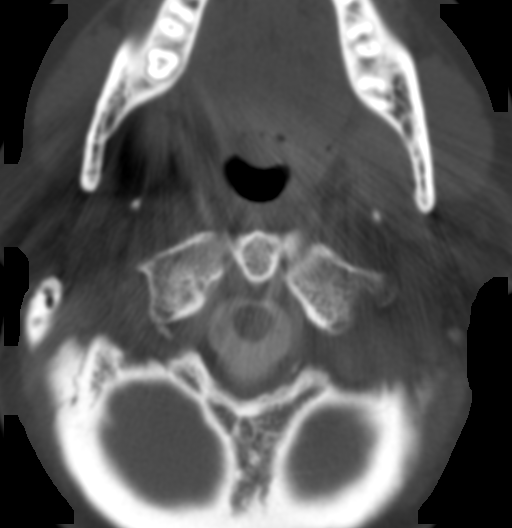

[10 of 14 positions shown; findings below may reference images not displayed]

Neck pain. Restriction of neck motion, and deltoid weakness.
Previous cervical fusion. Possible adjacent segment disease.

FLUOROSCOPY TIME:  1 minutes and 8 seconds.

PROCEDURE:
LUMBAR PUNCTURE FOR CERVICAL LUMBAR  MYELOGRAM

CERVICAL AND LUMBAR MYELOGRAM

CT CERVICAL MYELOGRAM

CT LUMBAR MYELOGRAM

After thorough discussion of risks and benefits of the procedure
including bleeding, infection, injury to nerves, blood vessels,
adjacent structures as well as headache and CSF leak, written and
oral informed consent was obtained. Consent was obtained by Dr. Jose Mauricio
Clinto.

Patient was positioned prone on the fluoroscopy table. Local
anesthesia was provided with 1% lidocaine without epinephrine after
prepped and draped in the usual sterile fashion. Puncture was
performed at L3-L4 using a 3 1/2 inch 22-gauge spinal needle via
midline approach. Using a single pass through the dura, the needle
was placed within the thecal sac, with return of clear CSF. 10 mL
Amnipaque-500 was injected into the thecal sac, with normal
opacification of the nerve roots and cauda equina consistent with
free flow within the subarachnoid space. The patient was then moved
to the trendelenburg position and contrast flowed into the Thoracic
and Cervical spine regions.

I personally performed the lumbar puncture and administered the
intrathecal contrast. I also personally supervised acquisition of
the myelogram images.
FINDINGS: CERVICAL AND LUMBAR MYELOGRAM FINDINGS:

LUMBAR:  Good opacification lumbar subarachnoid space.

Waist like narrowing at L5-S1 relates to prominent epidural fat,
ventral osseous spurring, and posterior element hypertrophy. Severe
disc space narrowing at the L5-S1 level is observed, status post
lumbar discectomy. Anatomic alignment. Other intervertebral disc
spaces are preserved. No frank S1 nerve root cut off.

With patient standing, anatomic alignment is preserved, without
dynamic instability. Increased bulging of the L4-5 disc is present
with the patient upright.

CERVICAL: Good opacification of the cervical subarachnoid space.
Previous C5 through C7 fusion via ACDF with anterior plating. Minor
disc disease at C3-4 and C4-5 with calcified protrusions new
effacing the anterior subarachnoid space, but no significant
stenosis or cord compression. Hardware intact. Anatomic alignment.
No dynamic instability on flexion extension

CT CERVICAL MYELOGRAM FINDINGS:

Alignment: Anatomic

Vertebrae: No worrisome osseous lesions. Solid arthrodesis C5
through C7.

Cord: No cord compression. Effacement anterior subarachnoid space
with slight stenosis at C3-4 and C4-5.

Posterior Fossa: No tonsillar herniation.

Paraspinal tissues: No neck masses. Lung apices clear. Minor
atherosclerosis.

Disc levels:

The individual disc spaces were examined as follows:

C2-3:  Normal.

C3-4: Calcified central protrusion. Mild effacement anterior
subarachnoid space and minimal cord deformity. LEFT-sided facet
arthropathy. LEFT-sided uncinate spurring results in mild effacement
of the LEFT C4 nerve root.

C4-5: Calcified central protrusion. Mild effacement anterior
subarachnoid space and minimal cord deformity. LEFT greater than
RIGHT uncinate spurring. Mild facet arthropathy. No definite C5
nerve root impingement.

C5-6:  Solid fusion.  No impingement.

C6-7:  Solid fusion.  No impingement.

C7-T1: Slight bulge. Moderate RIGHT and mild LEFT facet arthropathy.
No impingement.

CT LUMBAR MYELOGRAM FINDINGS:

L1-L2:  Normal.

L2-L3:  Normal.

L3-L4:  Osseous ridging.  Mild facet arthropathy.  No impingement.

L4-L5: Osseous ridging. Mild facet arthropathy. Annular bulging. No
significant stenosis or subarticular zone/foraminal zone narrowing.

L5-S1: Severe disc space narrowing. Diffuse osseous ridging.
Regrowth and filling in of previous laminotomy. LEFT greater than
RIGHT facet arthropathy with annular bulging. Prominent ventral
epidural fat. No subarticular zone narrowing affecting the S1 nerve
roots but BILATERAL foraminal narrowing, much worse on the LEFT,
associated with a leftward extraforaminal protrusion (image 59
series 3) with vacuum phenomenon, contributes to LEFT greater than
RIGHT L5 nerve root impingement.
IMPRESSION: CERVICAL: Status post C5-C7 fusion with solid arthrodesis. No
dynamic instability.

Relatively mild adjacent segment disease at C3-4 and C4-5, calcified
protrusions at both levels, with mild to moderate facet arthrosis.
Severe spinal stenosis is not established. Asymmetric foraminal
narrowing at C3-4 on the LEFT could contribute to some LEFT-sided
radicular symptoms.

LUMBAR: Status post L5-S1 discectomy. No dynamic instability
although mild annular bulging at L4-5 develops with the patient
standing.

Severe disc space narrowing at L5-S1. Extraforaminal protrusion at
L5-S1 on the LEFT contributes to LEFT greater than RIGHT L5 nerve
root impingement in conjunction with diffuse osseous ridging.

## 2016-07-22 IMAGING — CT CT L SPINE W/ CM
3 of 8 series · 10 of 33 positions shown, 12 images · non-contrast
Comparison: none

CLINICAL DATA: Low back pain. Prior lumbar discectomy 30 years ago.
TECHNIQUE: Contiguous axial images were obtained through the Cervical,
Thoracic, and Lumbar spine after the intrathecal infusion of
infusion. Coronal and sagittal reconstructions were obtained of the
axial image sets.

[Series 2: l spine soft · axial · 0.27mm/px · z∈[+270,+363]mm · 2 of 73 slices shown, 3 images]
[im 21/73  soft-tissue]
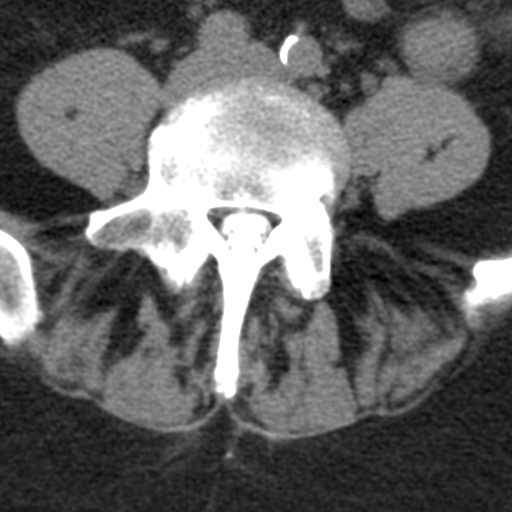
[im 21/73  bone]
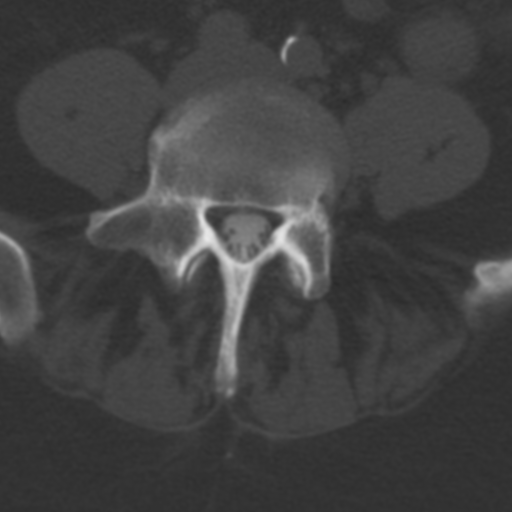
[im 52/73  bone]
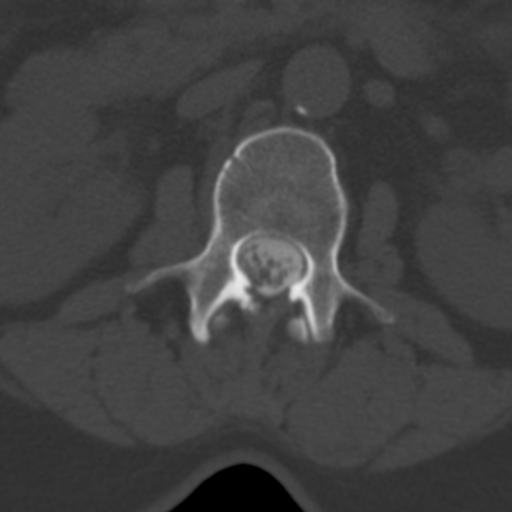

[Series 6: bone cor · coronal · 0.31mm/px · 3 of 32 slices shown]
[im 7/32  bone]
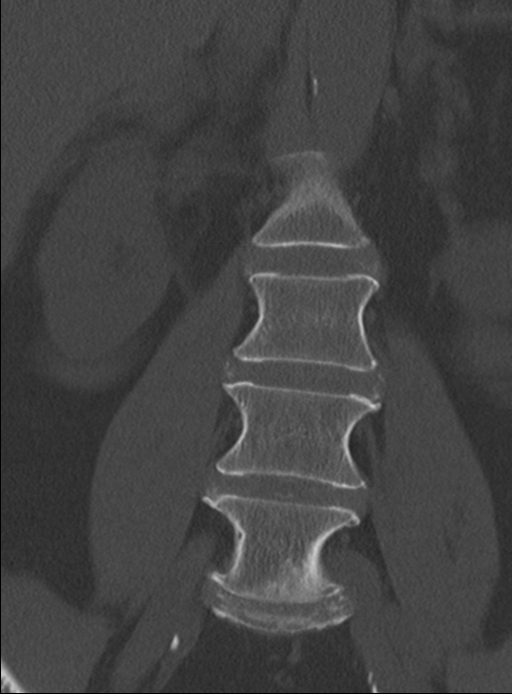
[im 13/32  bone]
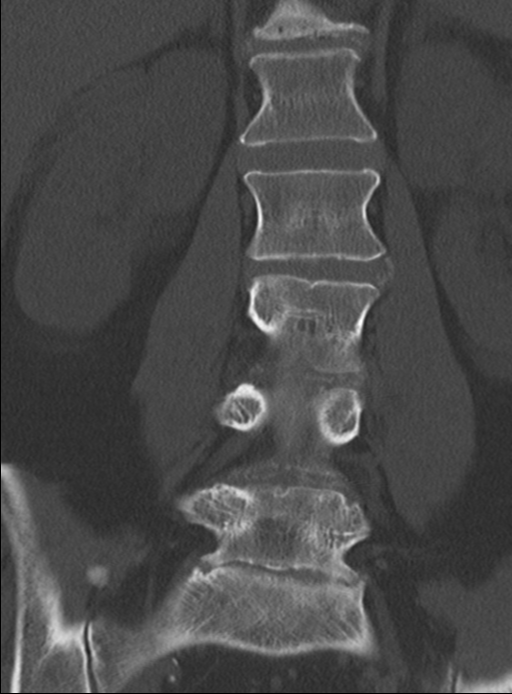
[im 19/32  bone]
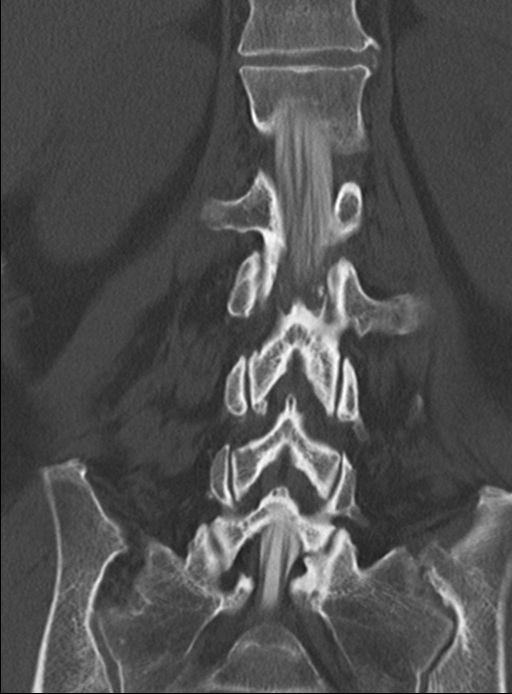

[Series 7: sag bone · sagittal · 0.29mm/px · 5 of 44 slices shown, 6 images]
[im 15/44  bone]
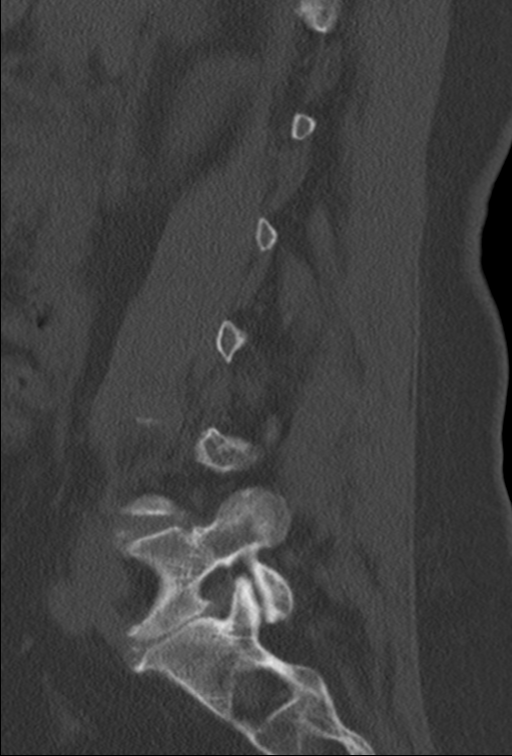
[im 18/44  bone]
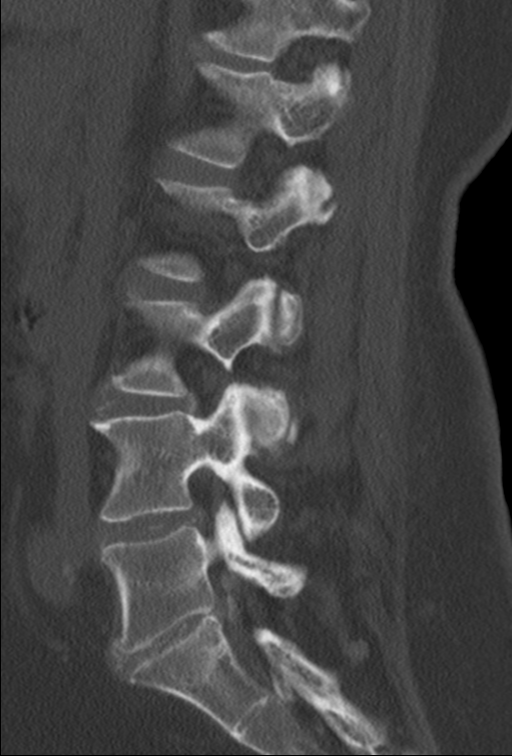
[im 22/44  soft-tissue]
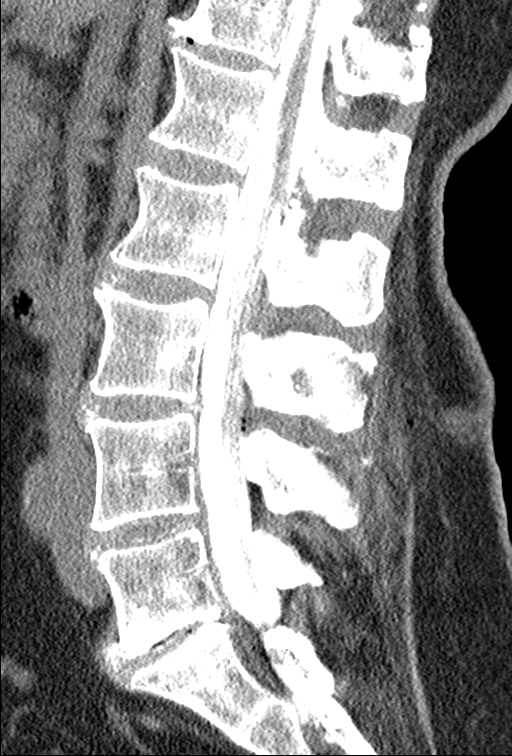
[im 22/44  bone]
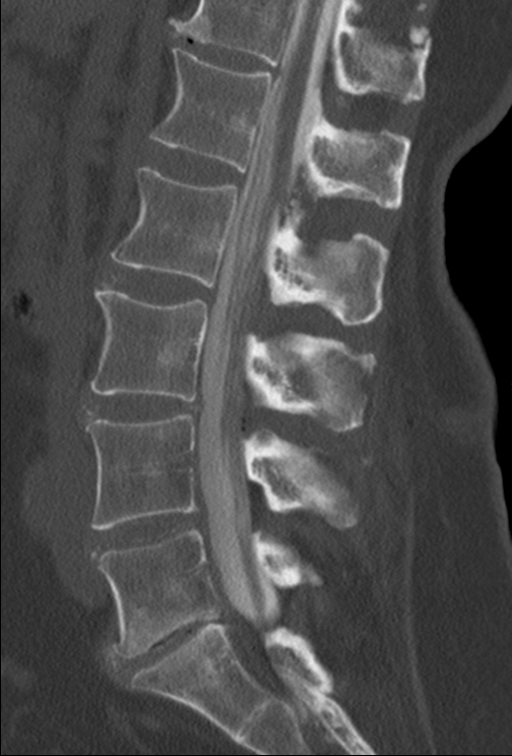
[im 26/44  bone]
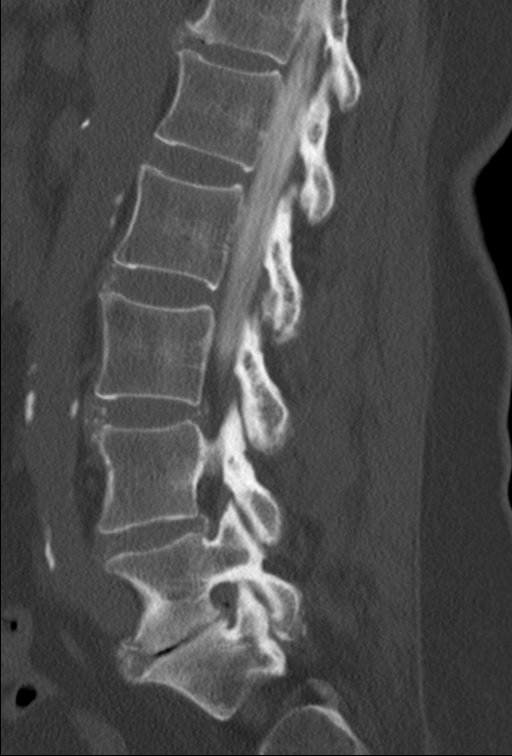
[im 29/44  bone]
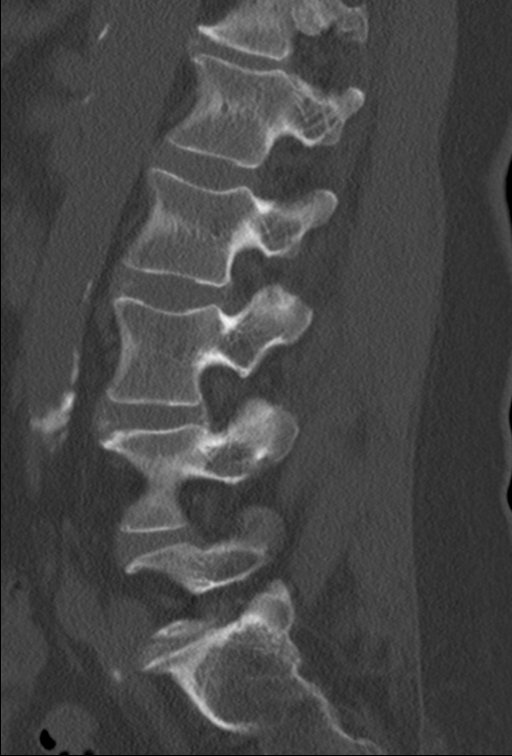

[10 of 33 positions shown; findings below may reference images not displayed]

Neck pain. Restriction of neck motion, and deltoid weakness.
Previous cervical fusion. Possible adjacent segment disease.

FLUOROSCOPY TIME:  1 minutes and 8 seconds.

PROCEDURE:
LUMBAR PUNCTURE FOR CERVICAL LUMBAR  MYELOGRAM

CERVICAL AND LUMBAR MYELOGRAM

CT CERVICAL MYELOGRAM

CT LUMBAR MYELOGRAM

After thorough discussion of risks and benefits of the procedure
including bleeding, infection, injury to nerves, blood vessels,
adjacent structures as well as headache and CSF leak, written and
oral informed consent was obtained. Consent was obtained by Dr. Jose Mauricio
Clinto.

Patient was positioned prone on the fluoroscopy table. Local
anesthesia was provided with 1% lidocaine without epinephrine after
prepped and draped in the usual sterile fashion. Puncture was
performed at L3-L4 using a 3 1/2 inch 22-gauge spinal needle via
midline approach. Using a single pass through the dura, the needle
was placed within the thecal sac, with return of clear CSF. 10 mL
Amnipaque-500 was injected into the thecal sac, with normal
opacification of the nerve roots and cauda equina consistent with
free flow within the subarachnoid space. The patient was then moved
to the trendelenburg position and contrast flowed into the Thoracic
and Cervical spine regions.

I personally performed the lumbar puncture and administered the
intrathecal contrast. I also personally supervised acquisition of
the myelogram images.
FINDINGS: CERVICAL AND LUMBAR MYELOGRAM FINDINGS:

LUMBAR:  Good opacification lumbar subarachnoid space.

Waist like narrowing at L5-S1 relates to prominent epidural fat,
ventral osseous spurring, and posterior element hypertrophy. Severe
disc space narrowing at the L5-S1 level is observed, status post
lumbar discectomy. Anatomic alignment. Other intervertebral disc
spaces are preserved. No frank S1 nerve root cut off.

With patient standing, anatomic alignment is preserved, without
dynamic instability. Increased bulging of the L4-5 disc is present
with the patient upright.

CERVICAL: Good opacification of the cervical subarachnoid space.
Previous C5 through C7 fusion via ACDF with anterior plating. Minor
disc disease at C3-4 and C4-5 with calcified protrusions new
effacing the anterior subarachnoid space, but no significant
stenosis or cord compression. Hardware intact. Anatomic alignment.
No dynamic instability on flexion extension

CT CERVICAL MYELOGRAM FINDINGS:

Alignment: Anatomic

Vertebrae: No worrisome osseous lesions. Solid arthrodesis C5
through C7.

Cord: No cord compression. Effacement anterior subarachnoid space
with slight stenosis at C3-4 and C4-5.

Posterior Fossa: No tonsillar herniation.

Paraspinal tissues: No neck masses. Lung apices clear. Minor
atherosclerosis.

Disc levels:

The individual disc spaces were examined as follows:

C2-3:  Normal.

C3-4: Calcified central protrusion. Mild effacement anterior
subarachnoid space and minimal cord deformity. LEFT-sided facet
arthropathy. LEFT-sided uncinate spurring results in mild effacement
of the LEFT C4 nerve root.

C4-5: Calcified central protrusion. Mild effacement anterior
subarachnoid space and minimal cord deformity. LEFT greater than
RIGHT uncinate spurring. Mild facet arthropathy. No definite C5
nerve root impingement.

C5-6:  Solid fusion.  No impingement.

C6-7:  Solid fusion.  No impingement.

C7-T1: Slight bulge. Moderate RIGHT and mild LEFT facet arthropathy.
No impingement.

CT LUMBAR MYELOGRAM FINDINGS:

L1-L2:  Normal.

L2-L3:  Normal.

L3-L4:  Osseous ridging.  Mild facet arthropathy.  No impingement.

L4-L5: Osseous ridging. Mild facet arthropathy. Annular bulging. No
significant stenosis or subarticular zone/foraminal zone narrowing.

L5-S1: Severe disc space narrowing. Diffuse osseous ridging.
Regrowth and filling in of previous laminotomy. LEFT greater than
RIGHT facet arthropathy with annular bulging. Prominent ventral
epidural fat. No subarticular zone narrowing affecting the S1 nerve
roots but BILATERAL foraminal narrowing, much worse on the LEFT,
associated with a leftward extraforaminal protrusion (image 59
series 3) with vacuum phenomenon, contributes to LEFT greater than
RIGHT L5 nerve root impingement.
IMPRESSION: CERVICAL: Status post C5-C7 fusion with solid arthrodesis. No
dynamic instability.

Relatively mild adjacent segment disease at C3-4 and C4-5, calcified
protrusions at both levels, with mild to moderate facet arthrosis.
Severe spinal stenosis is not established. Asymmetric foraminal
narrowing at C3-4 on the LEFT could contribute to some LEFT-sided
radicular symptoms.

LUMBAR: Status post L5-S1 discectomy. No dynamic instability
although mild annular bulging at L4-5 develops with the patient
standing.

Severe disc space narrowing at L5-S1. Extraforaminal protrusion at
L5-S1 on the LEFT contributes to LEFT greater than RIGHT L5 nerve
root impingement in conjunction with diffuse osseous ridging.

## 2016-08-14 ENCOUNTER — Other Ambulatory Visit: Payer: Self-pay | Admitting: Neurosurgery

## 2016-08-14 DIAGNOSIS — M5416 Radiculopathy, lumbar region: Secondary | ICD-10-CM

## 2016-08-23 ENCOUNTER — Other Ambulatory Visit: Payer: Self-pay | Admitting: Neurosurgery

## 2016-08-23 ENCOUNTER — Ambulatory Visit
Admission: RE | Admit: 2016-08-23 | Discharge: 2016-08-23 | Disposition: A | Discharge status: Home / Self Care | Payer: Medicare Other | Source: Ambulatory Visit | Attending: Neurosurgery | Admitting: Neurosurgery

## 2016-08-23 DIAGNOSIS — M5416 Radiculopathy, lumbar region: Secondary | ICD-10-CM

## 2016-08-23 DIAGNOSIS — M5417 Radiculopathy, lumbosacral region: Secondary | ICD-10-CM | POA: Diagnosis not present

## 2016-08-23 DIAGNOSIS — M47817 Spondylosis without myelopathy or radiculopathy, lumbosacral region: Secondary | ICD-10-CM | POA: Diagnosis not present

## 2016-08-23 MED ORDER — METHYLPREDNISOLONE ACETATE 40 MG/ML INJ SUSP (RADIOLOG
120.0000 mg | Freq: Once | INTRAMUSCULAR | Status: AC
  Administered 2016-08-23: 120 mg via EPIDURAL

## 2016-08-23 MED ORDER — IOPAMIDOL (ISOVUE-M 200) INJECTION 41%
1.0000 mL | Freq: Once | INTRAMUSCULAR | Status: AC
  Administered 2016-08-23: 1 mL via EPIDURAL

## 2016-08-23 NOTE — Discharge Instructions (Signed)

## 2016-09-11 ENCOUNTER — Other Ambulatory Visit: Payer: Self-pay | Admitting: Internal Medicine

## 2016-09-25 ENCOUNTER — Other Ambulatory Visit (INDEPENDENT_AMBULATORY_CARE_PROVIDER_SITE_OTHER): Payer: Medicare Other

## 2016-09-25 DIAGNOSIS — I10 Essential (primary) hypertension: Secondary | ICD-10-CM

## 2016-09-25 DIAGNOSIS — E7800 Pure hypercholesterolemia, unspecified: Secondary | ICD-10-CM | POA: Diagnosis not present

## 2016-09-25 LAB — HEPATIC FUNCTION PANEL
ALT: 23 U/L (ref 0–35)
AST: 16 U/L (ref 0–37)
Albumin: 3.9 g/dL (ref 3.5–5.2)
Alkaline Phosphatase: 58 U/L (ref 39–117)
Bilirubin, Direct: 0.1 mg/dL (ref 0.0–0.3)
Total Bilirubin: 0.9 mg/dL (ref 0.2–1.2)
Total Protein: 6.3 g/dL (ref 6.0–8.3)

## 2016-09-25 LAB — BASIC METABOLIC PANEL
BUN: 13 mg/dL (ref 6–23)
CO2: 30 mEq/L (ref 19–32)
Calcium: 9 mg/dL (ref 8.4–10.5)
Chloride: 101 mEq/L (ref 96–112)
Creatinine, Ser: 0.65 mg/dL (ref 0.40–1.20)
GFR: 95.94 mL/min (ref >60.00)
Glucose, Bld: 97 mg/dL (ref 70–99)
Potassium: 4.3 mEq/L (ref 3.5–5.1)
Sodium: 138 mEq/L (ref 135–145)

## 2016-09-25 LAB — LIPID PANEL
Cholesterol: 202 mg/dL — ABNORMAL HIGH (ref 0–200)
HDL: 59.3 mg/dL (ref >39.00)
LDL Cholesterol: 119 mg/dL — ABNORMAL HIGH (ref 0–99)
NonHDL: 142.87
Total CHOL/HDL Ratio: 3
Triglycerides: 118 mg/dL (ref 0.0–149.0)
VLDL: 23.6 mg/dL (ref 0.0–40.0)

## 2016-09-25 LAB — TSH: TSH: 3.87 u[IU]/mL (ref 0.35–4.50)

## 2016-09-30 ENCOUNTER — Encounter: Payer: Self-pay | Admitting: Internal Medicine

## 2016-09-30 ENCOUNTER — Ambulatory Visit (INDEPENDENT_AMBULATORY_CARE_PROVIDER_SITE_OTHER): Payer: Medicare Other | Admitting: Internal Medicine

## 2016-09-30 DIAGNOSIS — M545 Low back pain, unspecified: Secondary | ICD-10-CM

## 2016-09-30 DIAGNOSIS — E7800 Pure hypercholesterolemia, unspecified: Secondary | ICD-10-CM

## 2016-09-30 DIAGNOSIS — R0600 Dyspnea, unspecified: Secondary | ICD-10-CM | POA: Insufficient documentation

## 2016-09-30 DIAGNOSIS — I6523 Occlusion and stenosis of bilateral carotid arteries: Secondary | ICD-10-CM

## 2016-09-30 DIAGNOSIS — F439 Reaction to severe stress, unspecified: Secondary | ICD-10-CM

## 2016-09-30 DIAGNOSIS — R0609 Other forms of dyspnea: Secondary | ICD-10-CM | POA: Insufficient documentation

## 2016-09-30 DIAGNOSIS — I10 Essential (primary) hypertension: Secondary | ICD-10-CM | POA: Diagnosis not present

## 2016-09-30 DIAGNOSIS — R0602 Shortness of breath: Secondary | ICD-10-CM

## 2016-09-30 NOTE — Assessment & Plan Note (Signed)
Wanted to increase her crestor.  She is complaining of leg cramps.  Unclear if related.  Will temporarily stop the crestor.  Call with update of the next 2-3 weeks.  Will need treatment for her cholesterol.

## 2016-09-30 NOTE — Progress Notes (Signed)
Patient ID: Kristen Strickland, female   DOB: 1946-01-05, 70 y.o.   MRN: [id number]   Subjective:    Patient ID: Kristen Strickland, female    DOB: December 19, 1945, 70 y.o.   MRN: [id number]  HPI  Patient here for a scheduled follow up. Still trying to cope with the death of her daughter.  Overall she feels she is doing relatively well.  Has good support.  Stays active.  Notices some sob if she over exerts herself.  No chest pain.  No acid reflux.  Eating and drinking well.  No nausea or vomiting.  Bowels stable.  Has noticed getting winded when singing.  Back is doing better.  S/p injection.  Not taking pain medications.  Discussed labs.  She is having cramps.  Had planned to increase her crestor. If cramps, we discussed stopping for a brief period and seeing if symptoms resolve.  She will call with update over the next 2-3 weeks.  Will still need to treat cholesterol.     Past Medical History:  Diagnosis Date  . Arthritis   . Hypercholesterolemia   . Hypertension    Past Surgical History:  Procedure Laterality Date  . ABDOMINAL HYSTERECTOMY  1975  . ANTERIOR CERVICAL DECOMP/DISCECTOMY FUSION N/A 04/09/2013   Procedure: ANTERIOR CERVICAL DECOMPRESSION/DISCECTOMY FUSION 2 LEVELS;  Surgeon: Floyce Stakes, MD;  Location: [facility];  Service: Neurosurgery;  Laterality: N/A;  Cervical five-six Cervical six-seven  Anterior cervical decompression/diskectomy/fusion  . BACK SURGERY     ruptured disc  . RECONSTRUCTION OF NOSE     Family History  Problem Relation Age of Onset  . Cancer Mother     ovarian/uterine  . Heart disease Mother   . Colon cancer Father    Social History   Social History  . Marital status: Married    Spouse name: N/A  . Number of children: 2  . Years of education: N/A   Social History Main Topics  . Smoking status: Never Smoker  . Smokeless tobacco: Never Used  . Alcohol use 0.0 oz/week     Comment: rarely  . Drug use: No  . Sexual activity: Yes   Other Topics  Concern  . None   Social History Narrative  . None    Outpatient Encounter Prescriptions as of 09/30/2016  Medication Sig  . aspirin EC 81 MG tablet Take 81 mg by mouth daily.  Marland Kitchen estradiol (CLIMARA - DOSED IN MG/24 HR) 0.1 mg/24hr patch APPLY 1 PATCH ON SKIN ONCE WEEKLY.  Marland Kitchen losartan-hydrochlorothiazide (HYZAAR) 100-25 MG tablet TAKE 1 TABLET BY MOUTH ONCE DAILY.  . Multiple Vitamins-Minerals (CENTRUM PO) Take 1 tablet by mouth daily.   Marland Kitchen omeprazole (PRILOSEC) 20 MG capsule TAKE 1 CAPSULE BY MOUTH TWICE DAILY FOR REFLUX.  . rosuvastatin (CRESTOR) 5 MG tablet TAKE 1 TABLET BY MOUTH ONCE DAILY.  . traMADol (ULTRAM) 50 MG tablet Take 1 tablet (50 mg total) by mouth 2 (two) times daily as needed.  . valACYclovir (VALTREX) 500 MG tablet Take 1 tablet (500 mg total) by mouth 2 (two) times daily as needed.   No facility-administered encounter medications on file as of 09/30/2016.     Review of Systems  Constitutional: Negative for appetite change and unexpected weight change.  HENT: Negative for congestion and sinus pressure.   Respiratory: Positive for shortness of breath. Negative for cough and chest tightness.   Cardiovascular: Negative for chest pain, palpitations and leg swelling.  Gastrointestinal: Negative for abdominal pain, diarrhea, nausea  and vomiting.  Genitourinary: Negative for difficulty urinating and dysuria.  Musculoskeletal: Negative for joint swelling.       Back pain is better.    Skin: Negative for color change and rash.  Neurological: Negative for dizziness, light-headedness and headaches.  Psychiatric/Behavioral: Negative for agitation and dysphoric mood.       Objective:    Physical Exam  Constitutional: She appears well-developed and well-nourished. No distress.  HENT:  Nose: Nose normal.  Mouth/Throat: Oropharynx is clear and moist.  Neck: Neck supple. No thyromegaly present.  Cardiovascular: Normal rate and regular rhythm.   Pulmonary/Chest: Breath  sounds normal. No respiratory distress. She has no wheezes.  Abdominal: Soft. Bowel sounds are normal. There is no tenderness.  Musculoskeletal: She exhibits no edema or tenderness.  DP pulses palpable and equal bilaterally.   Lymphadenopathy:    She has no cervical adenopathy.  Skin: No rash noted. No erythema.  Psychiatric: She has a normal mood and affect. Her behavior is normal.    BP (!) 150/88   Pulse 65   Wt 157 lb (71.2 kg)   SpO2 98%   BMI 26.95 kg/m  Wt Readings from Last 3 Encounters:  09/30/16 157 lb (71.2 kg)  05/28/16 152 lb (68.9 kg)  03/29/16 159 lb (72.1 kg)     Lab Results  Component Value Date   WBC 5.5 01/12/2016   HGB 12.8 01/12/2016   HCT 38.2 01/12/2016   PLT 251.0 01/12/2016   GLUCOSE 97 09/25/2016   CHOL 202 (H) 09/25/2016   TRIG 118.0 09/25/2016   HDL 59.30 09/25/2016   LDLDIRECT 151.7 08/16/2013   LDLCALC 119 (H) 09/25/2016   ALT 23 09/25/2016   AST 16 09/25/2016   NA 138 09/25/2016   K 4.3 09/25/2016   CL 101 09/25/2016   CREATININE 0.65 09/25/2016   BUN 13 09/25/2016   CO2 30 09/25/2016   TSH 3.87 09/25/2016    Dg Epidural/nerve Root  Result Date: 08/23/2016 CLINICAL DATA:  Lumbosacral spondylosis without myelopathy. LEFT leg radicular symptoms. Partial improvement after the previous injection. EXAM: EPIDURAL/NERVE ROOT FLUOROSCOPY TIME:  Both 13 seconds corresponding to a Dose Area Product of 20.39 Gy*m2 PROCEDURE: The procedure, risks, benefits, and alternatives were explained to the patient. Questions regarding the procedure were encouraged and answered. The patient understands and consents to the procedure. LEFT S1 NERVE ROOT BLOCK AND TRANSFORAMINAL EPIDURAL: A posterior oblique approach was taken to the intervertebral foramen on the LEFT at L5-S1 using a curved 22 gauge spinal needle. Injection of Isovue M 200 outlined the LEFT S1 nerve root and showed good epidural spread. No vascular opacification is seen. 120.0 mg of Depo-Medrol  mixed with 2 mL 1% lidocaine were instilled. The procedure was well-tolerated, and the patient was discharged thirty minutes following the injection in good condition. COMPLICATIONS: None IMPRESSION: Technically successful injection consisting of a LEFT S1 nerve root block and transforaminal epidural. Electronically Signed   By: Staci Righter M.D.   On: 08/23/2016 08:24       Assessment & Plan:   Problem List Items Addressed This Visit    Back pain    Better s/p injection.  Not taking tramadol.  Follow.        Carotid stenosis    Due to f/u soon with Dr Delana Meyer.        Hypercholesteremia    Wanted to increase her crestor.  She is complaining of leg cramps.  Unclear if related.  Will temporarily stop the crestor.  Call with update of the next 2-3 weeks.  Will need treatment for her cholesterol.       Hypertension    Blood pressure elevated today.  Increased stress.  She will spot check her pressure.  Did not want to come in soon for f/u.  Agreed to 10 week f/u.  Hold on making changes.  If persistent elevation will adjust medications.        SOB (shortness of breath)    Shortness of breath as outlined.  She declined further evaluation and w/up.  Declined EKG.  Declined cxr.  Wants to monitor.  Follow.        Stress    Increased stress as outlined.  Discussed with her today.  She does not feel needs anything more at this time.  Follow.            Einar Pheasant, MD

## 2016-09-30 NOTE — Assessment & Plan Note (Signed)
Increased stress as outlined.  Discussed with her today.  She does not feel needs anything more at this time.  Follow.   

## 2016-09-30 NOTE — Assessment & Plan Note (Signed)
Blood pressure elevated today.  Increased stress.  She will spot check her pressure.  Did not want to come in soon for f/u.  Agreed to 10 week f/u.  Hold on making changes.  If persistent elevation will adjust medications.

## 2016-09-30 NOTE — Assessment & Plan Note (Signed)
Better s/p injection.  Not taking tramadol.  Follow.

## 2016-09-30 NOTE — Assessment & Plan Note (Signed)
Due to f/u soon with Dr Delana Meyer.

## 2016-09-30 NOTE — Assessment & Plan Note (Signed)
Shortness of breath as outlined.  She declined further evaluation and w/up.  Declined EKG.  Declined cxr.  Wants to monitor.  Follow.

## 2016-12-04 ENCOUNTER — Ambulatory Visit (INDEPENDENT_AMBULATORY_CARE_PROVIDER_SITE_OTHER): Payer: Medicare Other

## 2016-12-04 ENCOUNTER — Other Ambulatory Visit (INDEPENDENT_AMBULATORY_CARE_PROVIDER_SITE_OTHER): Payer: Self-pay | Admitting: Vascular Surgery

## 2016-12-04 ENCOUNTER — Ambulatory Visit (INDEPENDENT_AMBULATORY_CARE_PROVIDER_SITE_OTHER): Payer: Self-pay | Admitting: Vascular Surgery

## 2016-12-04 DIAGNOSIS — I6523 Occlusion and stenosis of bilateral carotid arteries: Secondary | ICD-10-CM | POA: Diagnosis not present

## 2016-12-04 LAB — VAS US CAROTID
LEFT ECA DIAS: 41 cm/s
Left CCA dist dias: -26 cm/s
Left CCA dist sys: -77 cm/s
Left CCA prox dias: 22 cm/s
Left CCA prox sys: 88 cm/s
Left ICA dist dias: -28 cm/s
Left ICA dist sys: -92 cm/s
Left ICA prox dias: 45 cm/s
Left ICA prox sys: 223 cm/s
RIGHT CCA MID DIAS: 20 cm/s
RIGHT ECA DIAS: -11 cm/s
Right CCA prox dias: 18 cm/s
Right CCA prox sys: 92 cm/s
Right cca dist sys: -102 cm/s

## 2016-12-06 ENCOUNTER — Encounter: Payer: Self-pay | Admitting: Internal Medicine

## 2016-12-06 ENCOUNTER — Ambulatory Visit (INDEPENDENT_AMBULATORY_CARE_PROVIDER_SITE_OTHER): Payer: Medicare Other | Admitting: Internal Medicine

## 2016-12-06 DIAGNOSIS — F439 Reaction to severe stress, unspecified: Secondary | ICD-10-CM

## 2016-12-06 DIAGNOSIS — N951 Menopausal and female climacteric states: Secondary | ICD-10-CM | POA: Diagnosis not present

## 2016-12-06 DIAGNOSIS — I10 Essential (primary) hypertension: Secondary | ICD-10-CM | POA: Diagnosis not present

## 2016-12-06 DIAGNOSIS — E7800 Pure hypercholesterolemia, unspecified: Secondary | ICD-10-CM | POA: Diagnosis not present

## 2016-12-06 DIAGNOSIS — I6523 Occlusion and stenosis of bilateral carotid arteries: Secondary | ICD-10-CM | POA: Diagnosis not present

## 2016-12-06 MED ORDER — ATORVASTATIN CALCIUM 10 MG PO TABS: 10.0000 mg | ORAL_TABLET | Freq: Every day | ORAL | 2 refills | Status: DC

## 2016-12-06 MED ORDER — TRAMADOL HCL 50 MG PO TABS: 50.0000 mg | ORAL_TABLET | Freq: Two times a day (BID) | ORAL | 1 refills | Status: DC | PRN

## 2016-12-06 MED ORDER — AMLODIPINE BESYLATE 5 MG PO TABS: 5.0000 mg | ORAL_TABLET | Freq: Every day | ORAL | 2 refills | Status: DC

## 2016-12-06 NOTE — Progress Notes (Signed)
Pre-visit discussion using our clinic review tool. No additional management support is needed unless otherwise documented below in the visit note.  

## 2016-12-06 NOTE — Progress Notes (Signed)
Patient ID: Kristen Strickland, female   DOB: 1946/01/27, 71 y.o.   MRN: [id number]   Subjective:    Patient ID: Kristen Strickland, female    DOB: Jun 12, 1946, 71 y.o.   MRN: [id number]  HPI  Patient here for a scheduled follow up.  Being followed for her blood pressure, cholesterol and increased stress.  Blood pressure was elevated last visit.  She is currently taking losartan/hctz.  Was asked to monitor and return for f/u.  Still elevation.  Varies - [id number] systolic readings.  No chest pain.  No sob.  No acid reflux.  No abdominal pain.  Handling stress.     Past Medical History:  Diagnosis Date  . Arthritis   . Hypercholesterolemia   . Hypertension    Past Surgical History:  Procedure Laterality Date  . ABDOMINAL HYSTERECTOMY  1975  . ANTERIOR CERVICAL DECOMP/DISCECTOMY FUSION N/A 04/09/2013   Procedure: ANTERIOR CERVICAL DECOMPRESSION/DISCECTOMY FUSION 2 LEVELS;  Surgeon: Floyce Stakes, MD;  Location: [facility];  Service: Neurosurgery;  Laterality: N/A;  Cervical five-six Cervical six-seven  Anterior cervical decompression/diskectomy/fusion  . BACK SURGERY     ruptured disc  . RECONSTRUCTION OF NOSE     Family History  Problem Relation Age of Onset  . Cancer Mother     ovarian/uterine  . Heart disease Mother   . Colon cancer Father    Social History   Social History  . Marital status: Married    Spouse name: N/A  . Number of children: 2  . Years of education: N/A   Social History Main Topics  . Smoking status: Never Smoker  . Smokeless tobacco: Never Used  . Alcohol use 0.0 oz/week     Comment: rarely  . Drug use: No  . Sexual activity: Yes   Other Topics Concern  . None   Social History Narrative  . None    Outpatient Encounter Prescriptions as of 12/06/2016  Medication Sig  . aspirin EC 81 MG tablet Take 81 mg by mouth daily.  Marland Kitchen estradiol (CLIMARA - DOSED IN MG/24 HR) 0.1 mg/24hr patch APPLY 1 PATCH ON SKIN ONCE WEEKLY.  Marland Kitchen losartan-hydrochlorothiazide  (HYZAAR) 100-25 MG tablet TAKE 1 TABLET BY MOUTH ONCE DAILY.  . Multiple Vitamins-Minerals (CENTRUM PO) Take 1 tablet by mouth daily.   Marland Kitchen omeprazole (PRILOSEC) 20 MG capsule TAKE 1 CAPSULE BY MOUTH TWICE DAILY FOR REFLUX.  . traMADol (ULTRAM) 50 MG tablet Take 1 tablet (50 mg total) by mouth 2 (two) times daily as needed.  . valACYclovir (VALTREX) 500 MG tablet Take 1 tablet (500 mg total) by mouth 2 (two) times daily as needed.  . [DISCONTINUED] traMADol (ULTRAM) 50 MG tablet Take 1 tablet (50 mg total) by mouth 2 (two) times daily as needed.  Marland Kitchen amLODipine (NORVASC) 5 MG tablet Take 1 tablet (5 mg total) by mouth daily.  Marland Kitchen atorvastatin (LIPITOR) 10 MG tablet Take 1 tablet (10 mg total) by mouth daily.  . [DISCONTINUED] rosuvastatin (CRESTOR) 5 MG tablet TAKE 1 TABLET BY MOUTH ONCE DAILY. (Patient not taking: Reported on 12/06/2016)   No facility-administered encounter medications on file as of 12/06/2016.     Review of Systems  Constitutional: Negative for appetite change and unexpected weight change.  HENT: Negative for congestion and sinus pressure.   Respiratory: Negative for cough, chest tightness and shortness of breath.   Cardiovascular: Negative for chest pain, palpitations and leg swelling.  Gastrointestinal: Negative for abdominal pain, diarrhea, nausea and vomiting.  Genitourinary:  Negative for difficulty urinating and dysuria.  Musculoskeletal: Negative for back pain and joint swelling.  Skin: Negative for color change and rash.  Neurological: Negative for dizziness, light-headedness and headaches.  Psychiatric/Behavioral: Negative for agitation and dysphoric mood.       Objective:    Physical Exam  Constitutional: She appears well-developed and well-nourished. No distress.  HENT:  Nose: Nose normal.  Mouth/Throat: Oropharynx is clear and moist.  Neck: Neck supple. No thyromegaly present.  Cardiovascular: Normal rate and regular rhythm.   Pulmonary/Chest: Breath sounds  normal. No respiratory distress. She has no wheezes.  Abdominal: Soft. Bowel sounds are normal. There is no tenderness.  Musculoskeletal: She exhibits no edema or tenderness.  Lymphadenopathy:    She has no cervical adenopathy.  Skin: No rash noted. No erythema.  Psychiatric: She has a normal mood and affect. Her behavior is normal.    BP (!) 144/82 (BP Location: Left Arm, Patient Position: Sitting, Cuff Size: Large)   Pulse 61   Temp 98.6 F (37 C) (Oral)   Resp 18   Ht 5\' 4"  (1.626 m)   Wt 161 lb (73 kg)   SpO2 98%   BMI 27.64 kg/m  Wt Readings from Last 3 Encounters:  12/06/16 161 lb (73 kg)  09/30/16 157 lb (71.2 kg)  05/28/16 152 lb (68.9 kg)     Lab Results  Component Value Date   WBC 5.5 01/12/2016   HGB 12.8 01/12/2016   HCT 38.2 01/12/2016   PLT 251.0 01/12/2016   GLUCOSE 97 09/25/2016   CHOL 202 (H) 09/25/2016   TRIG 118.0 09/25/2016   HDL 59.30 09/25/2016   LDLDIRECT 151.7 08/16/2013   LDLCALC 119 (H) 09/25/2016   ALT 23 09/25/2016   AST 16 09/25/2016   NA 138 09/25/2016   K 4.3 09/25/2016   CL 101 09/25/2016   CREATININE 0.65 09/25/2016   BUN 13 09/25/2016   CO2 30 09/25/2016   TSH 3.87 09/25/2016    Dg Epidural/nerve Root  Result Date: 08/23/2016 CLINICAL DATA:  Lumbosacral spondylosis without myelopathy. LEFT leg radicular symptoms. Partial improvement after the previous injection. EXAM: EPIDURAL/NERVE ROOT FLUOROSCOPY TIME:  Both 13 seconds corresponding to a Dose Area Product of 20.39 Gy*m2 PROCEDURE: The procedure, risks, benefits, and alternatives were explained to the patient. Questions regarding the procedure were encouraged and answered. The patient understands and consents to the procedure. LEFT S1 NERVE ROOT BLOCK AND TRANSFORAMINAL EPIDURAL: A posterior oblique approach was taken to the intervertebral foramen on the LEFT at L5-S1 using a curved 22 gauge spinal needle. Injection of Isovue M 200 outlined the LEFT S1 nerve root and showed good  epidural spread. No vascular opacification is seen. 120.0 mg of Depo-Medrol mixed with 2 mL 1% lidocaine were instilled. The procedure was well-tolerated, and the patient was discharged thirty minutes following the injection in good condition. COMPLICATIONS: None IMPRESSION: Technically successful injection consisting of a LEFT S1 nerve root block and transforaminal epidural. Electronically Signed   By: Staci Righter M.D.   On: 08/23/2016 08:24       Assessment & Plan:   Problem List Items Addressed This Visit    Carotid stenosis    Saw [facility].  Need results.        Relevant Medications   amLODipine (NORVASC) 5 MG tablet   atorvastatin (LIPITOR) 10 MG tablet   Hypercholesteremia    Did not tolerate crestor.  Try lipitor 10mg  as directed.  Follow.  Check liver panel in 6 weeks.  Relevant Medications   amLODipine (NORVASC) 5 MG tablet   atorvastatin (LIPITOR) 10 MG tablet   Other Relevant Orders   Hepatic function panel   Hypertension    Blood pressure remains elevated.  Start amlodipine 5mg  per day.  Follow.        Relevant Medications   amLODipine (NORVASC) 5 MG tablet   atorvastatin (LIPITOR) 10 MG tablet   Menopausal symptoms    On estrogen patch.  Has tried to come off.  We discussed slow taper.        Stress    Increased stress.  Overall she feels she is handling things relatively well.  Follow.  Does not feel needs any further intervention.            Einar Pheasant, MD

## 2016-12-08 ENCOUNTER — Encounter: Payer: Self-pay | Admitting: Internal Medicine

## 2016-12-08 NOTE — Assessment & Plan Note (Signed)
Saw [facility].  Need results.

## 2016-12-08 NOTE — Assessment & Plan Note (Signed)
Blood pressure remains elevated.  Start amlodipine 5mg  per day.  Follow.

## 2016-12-08 NOTE — Assessment & Plan Note (Signed)
Increased stress.  Overall she feels she is handling things relatively well.  Follow.  Does not feel needs any further intervention.

## 2016-12-08 NOTE — Assessment & Plan Note (Signed)
On estrogen patch.  Has tried to come off.  We discussed slow taper.

## 2016-12-08 NOTE — Assessment & Plan Note (Signed)
Did not tolerate crestor.  Try lipitor 10mg  as directed.  Follow.  Check liver panel in 6 weeks.

## 2016-12-11 ENCOUNTER — Other Ambulatory Visit: Payer: Self-pay | Admitting: Internal Medicine

## 2016-12-11 NOTE — Telephone Encounter (Signed)
Refilled on 09/11/2016. Last office 12/06/2016. Follow up appt scheduled for 01/31/2017.

## 2016-12-12 ENCOUNTER — Encounter (INDEPENDENT_AMBULATORY_CARE_PROVIDER_SITE_OTHER): Payer: Self-pay | Admitting: Vascular Surgery

## 2017-01-17 ENCOUNTER — Other Ambulatory Visit: Payer: Medicare Other

## 2017-01-21 ENCOUNTER — Other Ambulatory Visit (INDEPENDENT_AMBULATORY_CARE_PROVIDER_SITE_OTHER): Payer: Medicare Other

## 2017-01-21 DIAGNOSIS — E7800 Pure hypercholesterolemia, unspecified: Secondary | ICD-10-CM | POA: Diagnosis not present

## 2017-01-21 LAB — HEPATIC FUNCTION PANEL
ALT: 27 U/L (ref 0–35)
AST: 20 U/L (ref 0–37)
Albumin: 4 g/dL (ref 3.5–5.2)
Alkaline Phosphatase: 60 U/L (ref 39–117)
Bilirubin, Direct: 0.1 mg/dL (ref 0.0–0.3)
Total Bilirubin: 0.7 mg/dL (ref 0.2–1.2)
Total Protein: 6.8 g/dL (ref 6.0–8.3)

## 2017-01-22 DIAGNOSIS — M25562 Pain in left knee: Secondary | ICD-10-CM | POA: Diagnosis not present

## 2017-01-22 DIAGNOSIS — M150 Primary generalized (osteo)arthritis: Secondary | ICD-10-CM | POA: Diagnosis not present

## 2017-01-31 ENCOUNTER — Ambulatory Visit (INDEPENDENT_AMBULATORY_CARE_PROVIDER_SITE_OTHER): Payer: Medicare Other | Admitting: Internal Medicine

## 2017-01-31 ENCOUNTER — Encounter: Payer: Self-pay | Admitting: Internal Medicine

## 2017-01-31 VITALS — BP 134/80 | HR 67 | Temp 98.6°F | Resp 12 | Ht 64.0 in | Wt 155.6 lb

## 2017-01-31 DIAGNOSIS — F439 Reaction to severe stress, unspecified: Secondary | ICD-10-CM | POA: Diagnosis not present

## 2017-01-31 DIAGNOSIS — I6523 Occlusion and stenosis of bilateral carotid arteries: Secondary | ICD-10-CM

## 2017-01-31 DIAGNOSIS — R42 Dizziness and giddiness: Secondary | ICD-10-CM

## 2017-01-31 DIAGNOSIS — I10 Essential (primary) hypertension: Secondary | ICD-10-CM

## 2017-01-31 DIAGNOSIS — R0602 Shortness of breath: Secondary | ICD-10-CM

## 2017-01-31 DIAGNOSIS — E7800 Pure hypercholesterolemia, unspecified: Secondary | ICD-10-CM

## 2017-01-31 MED ORDER — TRAMADOL HCL 50 MG PO TABS: 50.0000 mg | ORAL_TABLET | Freq: Two times a day (BID) | ORAL | 1 refills | Status: DC | PRN

## 2017-01-31 MED ORDER — OMEPRAZOLE 20 MG PO CPDR: DELAYED_RELEASE_CAPSULE | ORAL | 1 refills | Status: DC

## 2017-01-31 MED ORDER — LOSARTAN POTASSIUM-HCTZ 100-25 MG PO TABS: 1.0000 | ORAL_TABLET | Freq: Every day | ORAL | 5 refills | Status: DC

## 2017-01-31 MED ORDER — AMLODIPINE BESYLATE 5 MG PO TABS: 5.0000 mg | ORAL_TABLET | Freq: Every day | ORAL | 2 refills | Status: DC

## 2017-01-31 NOTE — Progress Notes (Signed)
Patient ID: Kristen Strickland, female   DOB: 01-24-1946, 71 y.o.   MRN: [id number]   Subjective:    Patient ID: Kristen Strickland, female    DOB: 1946-07-08, 71 y.o.   MRN: [id number]  HPI  Patient here for a scheduled follow up.  Recently saw Dr Jefm Bryant for her left knee pain.  s/p injection.  Recently had sore throat.  Resolved without intervention.  Then reported after hiding eggs, noticed legs aching.  Did not sleep well.  Some nausea with this.  She reports persistent intermittent dizziness.  No headache.  Some of the dizziness is reproducible with certain movements or position changes.  No sinus congestion.  No chest congestion.  Does report increased sob with exertion.  States just does not feel as well.     Past Medical History:  Diagnosis Date  . Arthritis   . Hypercholesterolemia   . Hypertension    Past Surgical History:  Procedure Laterality Date  . ABDOMINAL HYSTERECTOMY  1975  . ANTERIOR CERVICAL DECOMP/DISCECTOMY FUSION N/A 04/09/2013   Procedure: ANTERIOR CERVICAL DECOMPRESSION/DISCECTOMY FUSION 2 LEVELS;  Surgeon: Floyce Stakes, MD;  Location: [facility];  Service: Neurosurgery;  Laterality: N/A;  Cervical five-six Cervical six-seven  Anterior cervical decompression/diskectomy/fusion  . BACK SURGERY     ruptured disc  . RECONSTRUCTION OF NOSE     Family History  Problem Relation Age of Onset  . Cancer Mother     ovarian/uterine  . Heart disease Mother   . Colon cancer Father    Social History   Social History  . Marital status: Married    Spouse name: N/A  . Number of children: 2  . Years of education: N/A   Social History Main Topics  . Smoking status: Never Smoker  . Smokeless tobacco: Never Used  . Alcohol use 0.0 oz/week     Comment: rarely  . Drug use: No  . Sexual activity: Yes   Other Topics Concern  . None   Social History Narrative  . None    Outpatient Encounter Prescriptions as of 01/31/2017  Medication Sig  . amLODipine (NORVASC) 5 MG  tablet Take 1 tablet (5 mg total) by mouth daily.  Marland Kitchen aspirin EC 81 MG tablet Take 81 mg by mouth daily.  Marland Kitchen atorvastatin (LIPITOR) 10 MG tablet Take 1 tablet (10 mg total) by mouth daily.  Marland Kitchen estradiol (CLIMARA - DOSED IN MG/24 HR) 0.1 mg/24hr patch APPLY 1 PATCH ON SKIN ONCE WEEKLY.  Marland Kitchen losartan-hydrochlorothiazide (HYZAAR) 100-25 MG tablet Take 1 tablet by mouth daily.  . Multiple Vitamins-Minerals (CENTRUM PO) Take 1 tablet by mouth daily.   Marland Kitchen omeprazole (PRILOSEC) 20 MG capsule TAKE 1 CAPSULE BY MOUTH TWICE DAILY FOR REFLUX.  . traMADol (ULTRAM) 50 MG tablet Take 1 tablet (50 mg total) by mouth 2 (two) times daily as needed.  . valACYclovir (VALTREX) 500 MG tablet Take 1 tablet (500 mg total) by mouth 2 (two) times daily as needed.  . [DISCONTINUED] amLODipine (NORVASC) 5 MG tablet Take 1 tablet (5 mg total) by mouth daily.  . [DISCONTINUED] losartan-hydrochlorothiazide (HYZAAR) 100-25 MG tablet TAKE 1 TABLET BY MOUTH ONCE DAILY.  . [DISCONTINUED] omeprazole (PRILOSEC) 20 MG capsule TAKE 1 CAPSULE BY MOUTH TWICE DAILY FOR REFLUX.  . [DISCONTINUED] traMADol (ULTRAM) 50 MG tablet Take 1 tablet (50 mg total) by mouth 2 (two) times daily as needed.   No facility-administered encounter medications on file as of 01/31/2017.     Review of Systems  Constitutional: Positive for fatigue. Negative for appetite change and unexpected weight change.  HENT: Negative for congestion and sinus pressure.   Respiratory: Positive for shortness of breath. Negative for cough and chest tightness.   Cardiovascular: Negative for chest pain, palpitations and leg swelling.  Gastrointestinal: Negative for abdominal pain, diarrhea, nausea and vomiting.  Genitourinary: Negative for difficulty urinating and dysuria.  Musculoskeletal: Positive for back pain. Negative for joint swelling.  Skin: Negative for color change and rash.  Neurological: Positive for dizziness and light-headedness. Negative for headaches.    Psychiatric/Behavioral: Negative for agitation and dysphoric mood.       Objective:    Physical Exam  Constitutional: She appears well-developed and well-nourished. No distress.  HENT:  Nose: Nose normal.  Mouth/Throat: Oropharynx is clear and moist.  Neck: Neck supple. No thyromegaly present.  Cardiovascular: Normal rate and regular rhythm.   Pulmonary/Chest: Breath sounds normal. No respiratory distress. She has no wheezes.  Abdominal: Soft. Bowel sounds are normal. There is no tenderness.  Musculoskeletal: She exhibits no edema or tenderness.  Lymphadenopathy:    She has no cervical adenopathy.  Skin: No rash noted. No erythema.  Psychiatric: She has a normal mood and affect. Her behavior is normal.    BP 134/80 (BP Location: Left Arm, Patient Position: Sitting, Cuff Size: Normal)   Pulse 67   Temp 98.6 F (37 C) (Oral)   Resp 12   Ht 5\' 4"  (1.626 m)   Wt 155 lb 9.6 oz (70.6 kg)   SpO2 96%   BMI 26.71 kg/m  Wt Readings from Last 3 Encounters:  01/31/17 155 lb 9.6 oz (70.6 kg)  12/06/16 161 lb (73 kg)  09/30/16 157 lb (71.2 kg)     Lab Results  Component Value Date   WBC 5.5 01/12/2016   HGB 12.8 01/12/2016   HCT 38.2 01/12/2016   PLT 251.0 01/12/2016   GLUCOSE 97 09/25/2016   CHOL 202 (H) 09/25/2016   TRIG 118.0 09/25/2016   HDL 59.30 09/25/2016   LDLDIRECT 151.7 08/16/2013   LDLCALC 119 (H) 09/25/2016   ALT 27 01/21/2017   AST 20 01/21/2017   NA 138 09/25/2016   K 4.3 09/25/2016   CL 101 09/25/2016   CREATININE 0.65 09/25/2016   BUN 13 09/25/2016   CO2 30 09/25/2016   TSH 3.87 09/25/2016    Dg Epidural/nerve Root  Result Date: 08/23/2016 CLINICAL DATA:  Lumbosacral spondylosis without myelopathy. LEFT leg radicular symptoms. Partial improvement after the previous injection. EXAM: EPIDURAL/NERVE ROOT FLUOROSCOPY TIME:  Both 13 seconds corresponding to a Dose Area Product of 20.39 Gy*m2 PROCEDURE: The procedure, risks, benefits, and alternatives  were explained to the patient. Questions regarding the procedure were encouraged and answered. The patient understands and consents to the procedure. LEFT S1 NERVE ROOT BLOCK AND TRANSFORAMINAL EPIDURAL: A posterior oblique approach was taken to the intervertebral foramen on the LEFT at L5-S1 using a curved 22 gauge spinal needle. Injection of Isovue M 200 outlined the LEFT S1 nerve root and showed good epidural spread. No vascular opacification is seen. 120.0 mg of Depo-Medrol mixed with 2 mL 1% lidocaine were instilled. The procedure was well-tolerated, and the patient was discharged thirty minutes following the injection in good condition. COMPLICATIONS: None IMPRESSION: Technically successful injection consisting of a LEFT S1 nerve root block and transforaminal epidural. Electronically Signed   By: Staci Righter M.D.   On: 08/23/2016 08:24       Assessment & Plan:   Problem List Items  Addressed This Visit    Carotid stenosis    Followed by [facility].        Relevant Medications   amLODipine (NORVASC) 5 MG tablet   losartan-hydrochlorothiazide (HYZAAR) 100-25 MG tablet   Dizziness    Persistent intermittent dizziness.  On exam, some reproducible dizziness with lying back and looking to the left and right.  Refer to ENT for evaluation.        Relevant Orders   Ambulatory referral to ENT   Hypercholesteremia    On lipitor.  Unclear if some of her fatigue and not feeling well is related to the lipitor.  Will temporarily hold.  Follow.        Relevant Medications   amLODipine (NORVASC) 5 MG tablet   losartan-hydrochlorothiazide (HYZAAR) 100-25 MG tablet   Other Relevant Orders   Lipid panel   Hepatic function panel   Hypertension    Blood pressure doing better.  Not orthostatic on exam.  Follow pressures.  Follow metabolic panel.        Relevant Medications   amLODipine (NORVASC) 5 MG tablet   losartan-hydrochlorothiazide (HYZAAR) 100-25 MG tablet   Other Relevant Orders   CBC with  Differential/Platelet   Basic metabolic panel   SOB (shortness of breath)    SOB with exertion as outlined.  EKG - SR with no acute ischemic changes.  Discussed with her.  Given risk factors and symptoms, refer to cardiology for further evaluation.        Relevant Orders   Ambulatory referral to Cardiology   Stress    Discussed with her today.  Hold on any further intervention.  Follow.         Other Visit Diagnoses    SOB (shortness of breath) on exertion    -  Primary   Relevant Orders   EKG 12-Lead (Completed)     I spent 45 minutes with the patient and more than 50% of the time was spent in consultation regarding the above. Time spent obtaining history and discussing current symptoms.  Discussed ongoing issues.  Time also spent discussing current evaluation and treatment plan.     Einar Pheasant, MD

## 2017-01-31 NOTE — Progress Notes (Signed)
Pre-visit discussion using our clinic review tool. No additional management support is needed unless otherwise documented below in the visit note.  

## 2017-02-02 ENCOUNTER — Encounter: Payer: Self-pay | Admitting: Internal Medicine

## 2017-02-02 NOTE — Assessment & Plan Note (Signed)
Discussed with her today.  Hold on any further intervention.  Follow.

## 2017-02-02 NOTE — Assessment & Plan Note (Signed)
Blood pressure doing better.  Not orthostatic on exam.  Follow pressures.  Follow metabolic panel.

## 2017-02-02 NOTE — Assessment & Plan Note (Signed)
SOB with exertion as outlined.  EKG - SR with no acute ischemic changes.  Discussed with her.  Given risk factors and symptoms, refer to cardiology for further evaluation.

## 2017-02-02 NOTE — Assessment & Plan Note (Signed)
On lipitor.  Unclear if some of her fatigue and not feeling well is related to the lipitor.  Will temporarily hold.  Follow.

## 2017-02-02 NOTE — Assessment & Plan Note (Signed)
Persistent intermittent dizziness.  On exam, some reproducible dizziness with lying back and looking to the left and right.  Refer to ENT for evaluation.

## 2017-02-02 NOTE — Assessment & Plan Note (Signed)
Followed by [facility].   

## 2017-02-11 DIAGNOSIS — R42 Dizziness and giddiness: Secondary | ICD-10-CM | POA: Diagnosis not present

## 2017-02-11 DIAGNOSIS — H903 Sensorineural hearing loss, bilateral: Secondary | ICD-10-CM | POA: Diagnosis not present

## 2017-02-19 ENCOUNTER — Encounter: Payer: Self-pay | Admitting: Cardiology

## 2017-02-19 ENCOUNTER — Ambulatory Visit (INDEPENDENT_AMBULATORY_CARE_PROVIDER_SITE_OTHER): Payer: Medicare Other | Admitting: Cardiology

## 2017-02-19 VITALS — BP 122/70 | HR 59 | Ht 64.0 in | Wt 154.5 lb

## 2017-02-19 DIAGNOSIS — E784 Other hyperlipidemia: Secondary | ICD-10-CM | POA: Diagnosis not present

## 2017-02-19 DIAGNOSIS — R5383 Other fatigue: Secondary | ICD-10-CM

## 2017-02-19 DIAGNOSIS — I10 Essential (primary) hypertension: Secondary | ICD-10-CM | POA: Diagnosis not present

## 2017-02-19 DIAGNOSIS — I6523 Occlusion and stenosis of bilateral carotid arteries: Secondary | ICD-10-CM | POA: Diagnosis not present

## 2017-02-19 DIAGNOSIS — E7849 Other hyperlipidemia: Secondary | ICD-10-CM

## 2017-02-19 DIAGNOSIS — R0602 Shortness of breath: Secondary | ICD-10-CM | POA: Diagnosis not present

## 2017-02-19 NOTE — Patient Instructions (Addendum)
Labwork: Your physician recommends that you have labs done when you go for labs at Primary care office. TSH and Free T 4 to check your thyroid function.   Testing/Procedures: Your physician has requested that you have an echocardiogram. Echocardiography is a painless test that uses sound waves to create images of your heart. It provides your doctor with information about the size and shape of your heart and how well your heart's chambers and valves are working. This procedure takes approximately one hour. There are no restrictions for this procedure.  Your physician has recommended that you wear a holter monitor. Holter monitors are medical devices that record the heart's electrical activity. Doctors most often use these monitors to diagnose arrhythmias. Arrhythmias are problems with the speed or rhythm of the heartbeat. The monitor is a small, portable device. You can wear one while you do your normal daily activities. This is usually used to diagnose what is causing palpitations/syncope (passing out).  [place]  Your caregiver has ordered a Stress Test with nuclear imaging. The purpose of this test is to evaluate the blood supply to your heart muscle. This procedure is referred to as a "Non-Invasive Stress Test." This is because other than having an IV started in your vein, nothing is inserted or "invades" your body. Cardiac stress tests are done to find areas of poor blood flow to the heart by determining the extent of coronary artery disease (CAD).   Please note: these test may take anywhere between 2-4 hours to complete  PLEASE REPORT TO [place] AT THE FIRST DESK WILL DIRECT YOU WHERE TO GO  Date of Procedure:__Tuesday May 8, 2018__  Arrival Time for Procedure:__Arrive at 07:15AM___   PLEASE NOTIFY THE OFFICE AT LEAST 24 HOURS IN ADVANCE IF YOU ARE UNABLE TO [place].  [phone] AND  PLEASE NOTIFY NUCLEAR MEDICINE AT [facility] AT LEAST  24 HOURS IN ADVANCE IF YOU ARE UNABLE TO KEEP YOUR APPOINTMENT. [phone]  How to prepare for your Myoview test:  1. Do not eat or drink after midnight 2. No caffeine for 24 hours prior to test 3. No smoking 24 hours prior to test. 4. Your medication may be taken with water.  If your doctor stopped a medication because of this test, do not take that medication. 5. Ladies, please do not wear dresses.  Skirts or pants are appropriate. Please wear a short sleeve shirt. 6. No perfume, cologne or lotion. 7. Wear comfortable walking shoes. No heels!   Follow-Up: Your physician recommends that you schedule a follow-up appointment after testing.   It was a pleasure seeing you today here in the office. Please do not hesitate to give [place] a call back if you have any further questions. Schu[place], BSN    Echocardiogram An echocardiogram, or echocardiography, uses sound waves (ultrasound) to produce an image of your heart. The echocardiogram is simple, painless, obtained within a short period of time, and offers valuable information to your health care provider. The images from an echocardiogram can provide information such as:  Evidence of coronary artery disease (CAD).  Heart size.  Heart muscle function.  Heart valve function.  Aneurysm detection.  Evidence of a past heart attack.  Fluid buildup around the heart.  Heart muscle thickening.  Assess heart valve function. Tell a health care provider about:  Any allergies you have.  All medicines you are taking, including vitamins, herbs, eye drops, creams, and over-the-counter medicines.  Any problems you or family members have had with anesthetic medicines.  Any blood disorders you have.  Any surgeries you have had.  Any medical conditions you have.  Whether you are pregnant or may be pregnant. What happens before the procedure? No special preparation is needed. Eat and drink normally. What happens  during the procedure?  In order to produce an image of your heart, gel will be applied to your chest and a wand-like tool (transducer) will be moved over your chest. The gel will help transmit the sound waves from the transducer. The sound waves will harmlessly bounce off your heart to allow the heart images to be captured in real-time motion. These images will then be recorded.  You may need an IV to receive a medicine that improves the quality of the pictures. What happens after the procedure? You may return to your normal schedule including diet, activities, and medicines, unless your health care provider tells you otherwise. This information is not intended to replace advice given to you by your health care provider. Make sure you discuss any questions you have with your health care provider. Document Released: 10/04/2000 Document Revised: 05/25/2016 Document Reviewed: 06/14/2013 Elsevier Interactive Patient Education  2017 [place].  Holter Monitoring A Holter monitor is a small device that is used to detect abnormal heart rhythms. It clips to your clothing and is connected by wires to flat, sticky disks (electrodes) that attach to your chest. It is worn continuously for 24-48 hours. Follow these instructions at home:  Wear your Holter monitor at all times, even while exercising and sleeping, for as long as directed by your health care provider.  Make sure that the Holter monitor is safely clipped to your clothing or close to your body as recommended by your health care provider.  Do not get the monitor or wires wet.  Do not put body lotion or moisturizer on your chest.  Keep your skin clean.  Keep a diary of your daily activities, such as walking and doing chores. If you feel that your heartbeat is abnormal or that your heart is fluttering or skipping a beat:  Record what you are doing when it happens.  Record what time of day the symptoms occur.  Return your Holter monitor  as directed by your health care provider.  Keep all follow-up visits as directed by your health care provider. This is important. Get help right away if:  You feel lightheaded or you faint.  You have trouble breathing.  You feel pain in your chest, upper arm, or jaw.  You feel sick to your stomach and your skin is pale, cool, or damp.  You heartbeat feels unusual or abnormal. This information is not intended to replace advice given to you by your health care provider. Make sure you discuss any questions you have with your health care provider. Document Released: 07/05/2004 Document Revised: 03/14/2016 Document Reviewed: 05/16/2014 Elsevier Interactive Patient Education  2017 [place]. Cardiac Nuclear Scan A cardiac nuclear scan is a test that measures blood flow to the heart when a person is resting and when he or she is exercising. The test looks for problems such as:  Not enough blood reaching a portion of the heart.  The heart muscle not working normally. You may need this test if:  You have heart disease.  You have had abnormal lab results.  You have had heart surgery or angioplasty.  You have chest pain.  You have shortness of breath. In this  test, a radioactive dye (tracer) is injected into your bloodstream. After the tracer has traveled to your heart, an imaging device is used to measure how much of the tracer is absorbed by or distributed to various areas of your heart. This procedure is usually done at a hospital and takes 2-4 hours. Tell a health care provider about:  Any allergies you have.  All medicines you are taking, including vitamins, herbs, eye drops, creams, and over-the-counter medicines.  Any problems you or family members have had with the use of anesthetic medicines.  Any blood disorders you have.  Any surgeries you have had.  Any medical conditions you have.  Whether you are pregnant or may be pregnant. What are the risks? Generally,  this is a safe procedure. However, problems may occur, including:  Serious chest pain and heart attack. This is only a risk if the stress portion of the test is done.  Rapid heartbeat.  Sensation of warmth in your chest. This usually passes quickly. What happens before the procedure?  Ask your health care provider about changing or stopping your regular medicines. This is especially important if you are taking diabetes medicines or blood thinners.  Remove your jewelry on the day of the procedure. What happens during the procedure?  An IV tube will be inserted into one of your veins.  Your health care provider will inject a small amount of radioactive tracer through the tube.  You will wait for 20-40 minutes while the tracer travels through your bloodstream.  Your heart activity will be monitored with an electrocardiogram (ECG).  You will lie down on an exam table.  Images of your heart will be taken for about 15-20 minutes.  You may be asked to exercise on a treadmill or stationary bike. While you exercise, your heart's activity will be monitored with an ECG, and your blood pressure will be checked. If you are unable to exercise, you may be given a medicine to increase blood flow to parts of your heart.  When blood flow to your heart has peaked, a tracer will again be injected through the IV tube.  After 20-40 minutes, you will get back on the exam table and have more images taken of your heart.  When the procedure is over, your IV tube will be removed. The procedure may vary among health care providers and hospitals. Depending on the type of tracer used, scans may need to be repeated 3-4 hours later. What happens after the procedure?  Unless your health care provider tells you otherwise, you may return to your normal schedule, including diet, activities, and medicines.  Unless your health care provider tells you otherwise, you may increase your fluid intake. This will help  flush the contrast dye from your body. Drink enough fluid to keep your urine clear or pale yellow.  It is up to you to get your test results. Ask your health care provider, or the department that is doing the test, when your results will be ready. Summary  A cardiac nuclear scan measures the blood flow to the heart when a person is resting and when he or she is exercising.  You may need this test if you are at risk for heart disease.  Tell your health care provider if you are pregnant.  Unless your health care provider tells you otherwise, increase your fluid intake. This will help flush the contrast dye from your body. Drink enough fluid to keep your urine clear or pale yellow. This information  is not intended to replace advice given to you by your health care provider. Make sure you discuss any questions you have with your health care provider. Document Released: 11/01/2004 Document Revised: 10/09/2016 Document Reviewed: 09/15/2013 Elsevier Interactive Patient Education  2017 [employer].

## 2017-02-19 NOTE — Progress Notes (Signed)
Cardiology Office Note   Date:  02/19/2017   ID:  Kristen Strickland, DOB January 09, 1946, MRN [id number]  Referring Doctor:  Einar Pheasant, MD   Cardiologist:   Wende Bushy, MD   Reason for consultation:  Chief Complaint  Patient presents with  . other    Ref by Dr. Einar Pheasant for shortness of breath. Pt. c/o fatigue, shortness of breath with little exertion.       History of Present Illness: Kristen Strickland is a 71 y.o. female who is being seen today for the evaluation of Shortness of breath and fatigue at the request of Einar Pheasant, MD.  Patient reports that symptoms may have been going on for more than 6 months now. She complains of dyspnea with minimal exertion that has progressed over time. This occurs with any kind of physical activity, raking leaves, going up and down stairs of her basement. She has done these activities before but have now noticed that she gets extremely short of breath. She denies chest pain. The shortness of breath could be severe in intensity and lasts the duration of the activity. Goes away with rest. She also complains of associated extreme tiredness or fatigue. She does not report any passing out or loss of consciousness.  Patient denies PND, orthopnea, edema.  ROS:  Please see the history of present illness. Aside from mentioned under HPI, all other systems are reviewed and negative.     Past Medical History:  Diagnosis Date  . Arthritis   . Hypercholesterolemia   . Hypertension     Past Surgical History:  Procedure Laterality Date  . ABDOMINAL HYSTERECTOMY  1975  . ANTERIOR CERVICAL DECOMP/DISCECTOMY FUSION N/A 04/09/2013   Procedure: ANTERIOR CERVICAL DECOMPRESSION/DISCECTOMY FUSION 2 LEVELS;  Surgeon: Floyce Stakes, MD;  Location: [facility];  Service: Neurosurgery;  Laterality: N/A;  Cervical five-six Cervical six-seven  Anterior cervical decompression/diskectomy/fusion  . BACK SURGERY     ruptured disc  . RECONSTRUCTION OF NOSE        reports that she has never smoked. She has never used smokeless tobacco. She reports that she drinks alcohol. She reports that she does not use drugs.   family history includes Cancer in her mother; Colon cancer in her father; Heart disease in her mother.   Outpatient Medications Prior to Visit  Medication Sig Dispense Refill  . amLODipine (NORVASC) 5 MG tablet Take 1 tablet (5 mg total) by mouth daily. 30 tablet 2  . aspirin EC 81 MG tablet Take 81 mg by mouth daily.    Marland Kitchen estradiol (CLIMARA - DOSED IN MG/24 HR) 0.1 mg/24hr patch APPLY 1 PATCH ON SKIN ONCE WEEKLY. 12 patch 0  . losartan-hydrochlorothiazide (HYZAAR) 100-25 MG tablet Take 1 tablet by mouth daily. 30 tablet 5  . Multiple Vitamins-Minerals (CENTRUM PO) Take 1 tablet by mouth daily.     Marland Kitchen omeprazole (PRILOSEC) 20 MG capsule TAKE 1 CAPSULE BY MOUTH TWICE DAILY FOR REFLUX. 180 capsule 1  . traMADol (ULTRAM) 50 MG tablet Take 1 tablet (50 mg total) by mouth 2 (two) times daily as needed. 60 tablet 1  . valACYclovir (VALTREX) 500 MG tablet Take 1 tablet (500 mg total) by mouth 2 (two) times daily as needed. 30 tablet 0  . atorvastatin (LIPITOR) 10 MG tablet Take 1 tablet (10 mg total) by mouth daily. (Patient not taking: Reported on 02/19/2017) 30 tablet 2   No facility-administered medications prior to visit.      Allergies: Patient  has no known allergies.    PHYSICAL EXAM: VS:  BP 122/70 (BP Location: Right Arm, Patient Position: Sitting, Cuff Size: Normal)   Pulse (!) 59   Ht 5\' 4"  (1.626 m)   Wt 154 lb 8 oz (70.1 kg)   BMI 26.52 kg/m  , Body mass index is 26.52 kg/m. Wt Readings from Last 3 Encounters:  02/19/17 154 lb 8 oz (70.1 kg)  01/31/17 155 lb 9.6 oz (70.6 kg)  12/06/16 161 lb (73 kg)    GENERAL:  well developed, well nourished, not in acute distress HEENT: normocephalic, pink conjunctivae, anicteric sclerae, no xanthelasma, normal dentition, oropharynx clear NECK:  no neck vein engorgement, JVP normal,  no hepatojugular reflux, carotid upstroke brisk and symmetric, no bruit, no thyromegaly, no lymphadenopathy LUNGS:  good respiratory effort, clear to auscultation bilaterally CV:  PMI not displaced, no thrills, no lifts, S1 and S2 within normal limits, no palpable S3 or S4, no murmurs, no rubs, no gallops ABD:  Soft, nontender, nondistended, normoactive bowel sounds, no abdominal aortic bruit, no hepatomegaly, no splenomegaly MS: nontender back, no kyphosis, no scoliosis, no joint deformities EXT:  2+ DP/PT pulses, no edema, no varicosities, no cyanosis, no clubbing SKIN: warm, nondiaphoretic, normal turgor, no ulcers NEUROPSYCH: alert, oriented to person, place, and time, sensory/motor grossly intact, normal mood, appropriate affect  Recent Labs: 09/25/2016: BUN 13; Creatinine, Ser 0.65; Potassium 4.3; Sodium 138; TSH 3.87 01/21/2017: ALT 27   Lipid Panel    Component Value Date/Time   CHOL 202 (H) 09/25/2016 0802   TRIG 118.0 09/25/2016 0802   HDL 59.30 09/25/2016 0802   CHOLHDL 3 09/25/2016 0802   VLDL 23.6 09/25/2016 0802   LDLCALC 119 (H) 09/25/2016 0802   LDLDIRECT 151.7 08/16/2013 0816     Other studies Reviewed:  EKG:  The ekg from 02/19/2017 was personally reviewed by me and it revealed sinus rhythm, 59 BPM.  Additional studies/ records that were reviewed personally reviewed by me today include: None available   ASSESSMENT AND PLAN:  Dyspnea on exertion Fatigue We'll need to rule out any evidence of ischemia. Risk factors for CAD include age, hypertension, hyperlipidemia. Recommend to do exercise nuclear stress test, check for chronotropic competence as well. If patient unable to make target heart rate, should switch to pharmacologic nuclear stress test. Check echocardiogram. Recommend to do a 24-hour Holter monitor as well, resting heart rate in the 50s. Patient will be given a lab slip to check for TSH and free T4. She thinks that she is going for some blood work  through her PCP next week.  Hypertension BP is well controlled. Continue monitoring BP. Continue current medical therapy and lifestyle changes.  Hyperlipidemia She had been taking a statin and doing a trial of not taking it for now to see if her muscle cramping will help. She is very much aware and has been told by her PCP that she will need a statin. She had tried Crestor before with similar side effects. Can very well try simvastatin or pravastatin in the future. We'll defer to PCP.  Current medicines are reviewed at length with the patient today.  The patient does not have concerns regarding medicines.  Labs/ tests ordered today include:  Orders Placed This Encounter  Procedures  . NM Myocar Multi W/Spect W/Wall Motion / EF  . TSH  . T4, free  . Holter monitor - 24 hour  . EKG 12-Lead  . ECHOCARDIOGRAM COMPLETE    I had a lengthy and  detailed discussion with the patient regarding diagnoses, prognosis, diagnostic options, treatment options , and side effects of medications.     Disposition:   FU with Cardiology after tests    Thank you for this consultation. We will forwarding this consultation to referring physician.   Signed, Wende Bushy, MD  02/19/2017 11:46 AM    [place]  This note was generated in part with voice recognition software and I apologize for any typographical errors that were not detected and corrected.

## 2017-02-24 ENCOUNTER — Ambulatory Visit (INDEPENDENT_AMBULATORY_CARE_PROVIDER_SITE_OTHER): Payer: Medicare Other

## 2017-02-24 DIAGNOSIS — R0602 Shortness of breath: Secondary | ICD-10-CM | POA: Diagnosis not present

## 2017-02-24 DIAGNOSIS — R5383 Other fatigue: Secondary | ICD-10-CM

## 2017-02-25 ENCOUNTER — Encounter
Admission: RE | Admit: 2017-02-25 | Discharge: 2017-02-25 | Disposition: A | Discharge status: Home / Self Care | Payer: Medicare Other | Source: Ambulatory Visit | Attending: Cardiology | Admitting: Cardiology

## 2017-02-25 DIAGNOSIS — R0602 Shortness of breath: Secondary | ICD-10-CM

## 2017-02-25 DIAGNOSIS — R5383 Other fatigue: Secondary | ICD-10-CM | POA: Diagnosis not present

## 2017-02-25 MED ORDER — TECHNETIUM TC 99M TETROFOSMIN IV KIT
32.0020 | PACK | Freq: Once | INTRAVENOUS | Status: AC | PRN
  Administered 2017-02-25: 32.002 via INTRAVENOUS

## 2017-02-25 MED ORDER — TECHNETIUM TC 99M TETROFOSMIN IV KIT
12.5090 | PACK | Freq: Once | INTRAVENOUS | Status: AC | PRN
  Administered 2017-02-25: 12.509 via INTRAVENOUS

## 2017-02-26 LAB — NM MYOCAR MULTI W/SPECT W/WALL MOTION / EF
Estimated workload: 9.8 METS
Exercise duration (min): 7 min
Exercise duration (sec): 52 s
LV dias vol: 74 mL (ref 46–106)
LV sys vol: 22 mL
MPHR: 151 {beats}/min
Peak HR: 130 {beats}/min
Percent HR: 86 %
Rest HR: 64 {beats}/min
TID: 1.07

## 2017-03-03 ENCOUNTER — Other Ambulatory Visit (INDEPENDENT_AMBULATORY_CARE_PROVIDER_SITE_OTHER): Payer: Medicare Other

## 2017-03-03 DIAGNOSIS — R0602 Shortness of breath: Secondary | ICD-10-CM | POA: Diagnosis not present

## 2017-03-03 DIAGNOSIS — I10 Essential (primary) hypertension: Secondary | ICD-10-CM

## 2017-03-03 DIAGNOSIS — R5383 Other fatigue: Secondary | ICD-10-CM | POA: Diagnosis not present

## 2017-03-03 DIAGNOSIS — E7800 Pure hypercholesterolemia, unspecified: Secondary | ICD-10-CM | POA: Diagnosis not present

## 2017-03-03 LAB — HEPATIC FUNCTION PANEL
ALT: 22 U/L (ref 0–35)
AST: 19 U/L (ref 0–37)
Albumin: 4 g/dL (ref 3.5–5.2)
Alkaline Phosphatase: 55 U/L (ref 39–117)
Bilirubin, Direct: 0.1 mg/dL (ref 0.0–0.3)
Total Bilirubin: 0.8 mg/dL (ref 0.2–1.2)
Total Protein: 6.7 g/dL (ref 6.0–8.3)

## 2017-03-03 LAB — CBC WITH DIFFERENTIAL/PLATELET
Basophils Absolute: 0.1 10*3/uL (ref 0.0–0.1)
Basophils Relative: 1.5 % (ref 0.0–3.0)
Eosinophils Absolute: 0.1 10*3/uL (ref 0.0–0.7)
Eosinophils Relative: 2 % (ref 0.0–5.0)
HCT: 37.5 % (ref 36.0–46.0)
Hemoglobin: 12.7 g/dL (ref 12.0–15.0)
Lymphocytes Relative: 27.2 % (ref 12.0–46.0)
Lymphs Abs: 1.5 10*3/uL (ref 0.7–4.0)
MCHC: 33.9 g/dL (ref 30.0–36.0)
MCV: 89 fl (ref 78.0–100.0)
Monocytes Absolute: 0.5 10*3/uL (ref 0.1–1.0)
Monocytes Relative: 8.6 % (ref 3.0–12.0)
Neutro Abs: 3.3 10*3/uL (ref 1.4–7.7)
Neutrophils Relative %: 60.7 % (ref 43.0–77.0)
Platelets: 258 10*3/uL (ref 150.0–400.0)
RBC: 4.21 Mil/uL (ref 3.87–5.11)
RDW: 13.3 % (ref 11.5–15.5)
WBC: 5.4 10*3/uL (ref 4.0–10.5)

## 2017-03-03 LAB — BASIC METABOLIC PANEL
BUN: 16 mg/dL (ref 6–23)
CO2: 28 mEq/L (ref 19–32)
Calcium: 9.1 mg/dL (ref 8.4–10.5)
Chloride: 102 mEq/L (ref 96–112)
Creatinine, Ser: 0.69 mg/dL (ref 0.40–1.20)
GFR: 89.44 mL/min (ref >60.00)
Glucose, Bld: 100 mg/dL — ABNORMAL HIGH (ref 70–99)
Potassium: 4 mEq/L (ref 3.5–5.1)
Sodium: 136 mEq/L (ref 135–145)

## 2017-03-03 LAB — LIPID PANEL
Cholesterol: 219 mg/dL — ABNORMAL HIGH (ref 0–200)
HDL: 45.6 mg/dL (ref >39.00)
LDL Cholesterol: 141 mg/dL — ABNORMAL HIGH (ref 0–99)
NonHDL: 173.54
Total CHOL/HDL Ratio: 5
Triglycerides: 165 mg/dL — ABNORMAL HIGH (ref 0.0–149.0)
VLDL: 33 mg/dL (ref 0.0–40.0)

## 2017-03-03 NOTE — Addendum Note (Signed)
Addended by: Arby Barrette on: 03/03/2017 08:57 AM   Modules accepted: Orders

## 2017-03-04 ENCOUNTER — Other Ambulatory Visit: Payer: Self-pay | Admitting: Internal Medicine

## 2017-03-04 ENCOUNTER — Ambulatory Visit: Payer: Medicare Other | Admitting: Internal Medicine

## 2017-03-04 DIAGNOSIS — E7800 Pure hypercholesterolemia, unspecified: Secondary | ICD-10-CM

## 2017-03-04 LAB — T4, FREE: Free T4: 1.15 ng/dL (ref 0.82–1.77)

## 2017-03-04 LAB — TSH: TSH: 4.36 u[IU]/mL (ref 0.450–4.500)

## 2017-03-04 MED ORDER — PRAVASTATIN SODIUM 10 MG PO TABS: 10.0000 mg | ORAL_TABLET | Freq: Every day | ORAL | 1 refills | Status: DC

## 2017-03-04 NOTE — Progress Notes (Signed)
rx sent in for pravastatin #30 with one refill and ordered liver panel.

## 2017-03-04 NOTE — Addendum Note (Signed)
Addended by: Francella Solian on: 03/04/2017 11:25 AM   Modules accepted: Orders

## 2017-03-06 ENCOUNTER — Other Ambulatory Visit: Payer: Self-pay

## 2017-03-06 ENCOUNTER — Ambulatory Visit (INDEPENDENT_AMBULATORY_CARE_PROVIDER_SITE_OTHER): Payer: Medicare Other

## 2017-03-06 DIAGNOSIS — R0602 Shortness of breath: Secondary | ICD-10-CM

## 2017-03-06 DIAGNOSIS — R5383 Other fatigue: Secondary | ICD-10-CM | POA: Diagnosis not present

## 2017-03-07 ENCOUNTER — Ambulatory Visit
Admission: RE | Admit: 2017-03-07 | Discharge: 2017-03-07 | Disposition: A | Discharge status: Home / Self Care | Payer: Medicare Other | Source: Ambulatory Visit | Attending: Cardiology | Admitting: Cardiology

## 2017-03-07 DIAGNOSIS — R5383 Other fatigue: Secondary | ICD-10-CM | POA: Diagnosis not present

## 2017-03-07 DIAGNOSIS — I493 Ventricular premature depolarization: Secondary | ICD-10-CM | POA: Diagnosis not present

## 2017-03-07 DIAGNOSIS — R0602 Shortness of breath: Secondary | ICD-10-CM | POA: Insufficient documentation

## 2017-03-07 DIAGNOSIS — I491 Atrial premature depolarization: Secondary | ICD-10-CM | POA: Diagnosis not present

## 2017-03-12 ENCOUNTER — Ambulatory Visit: Payer: Medicare Other

## 2017-03-12 ENCOUNTER — Encounter: Payer: Self-pay | Admitting: Internal Medicine

## 2017-03-12 ENCOUNTER — Ambulatory Visit (INDEPENDENT_AMBULATORY_CARE_PROVIDER_SITE_OTHER): Payer: Medicare Other | Admitting: Internal Medicine

## 2017-03-12 VITALS — BP 136/60 | HR 69 | Ht 64.0 in | Wt 155.2 lb

## 2017-03-12 DIAGNOSIS — R0609 Other forms of dyspnea: Secondary | ICD-10-CM | POA: Diagnosis not present

## 2017-03-12 DIAGNOSIS — I10 Essential (primary) hypertension: Secondary | ICD-10-CM | POA: Diagnosis not present

## 2017-03-12 DIAGNOSIS — R0600 Dyspnea, unspecified: Secondary | ICD-10-CM

## 2017-03-12 DIAGNOSIS — R002 Palpitations: Secondary | ICD-10-CM

## 2017-03-12 MED ORDER — CARVEDILOL 3.125 MG PO TABS: 3.1250 mg | ORAL_TABLET | Freq: Two times a day (BID) | ORAL | 3 refills | Status: DC

## 2017-03-12 MED ORDER — AMLODIPINE BESYLATE 2.5 MG PO TABS: 2.5000 mg | ORAL_TABLET | Freq: Every day | ORAL | 2 refills | Status: DC

## 2017-03-12 NOTE — Patient Instructions (Signed)
Medication Instructions:  Your physician has recommended you make the following change in your medication:  1- DECREASE Amlodipine to 2.5 mg by mouth once a day. 2- START Carvedilol 3.125 mg  (1 tablet) by  Mouth two times a day.   Labwork: none  Testing/Procedures: none  Follow-Up: Your physician recommends that you schedule a follow-up appointment in: 3 MONTHS WITH DR END.   If you need a refill on your cardiac medications before your next appointment, please call your pharmacy.

## 2017-03-12 NOTE — Progress Notes (Signed)
Follow-up Outpatient Visit Date: 03/12/2017  Primary Care Provider: Einar Pheasant, [place] [address] [id number]  Chief Complaint: Shortness of breath  HPI:  Kristen Strickland is a 71 y.o. year-old female with history of degenerative disc disease, hypertension, carotid artery stenosis, and hyperlipidemia, who presents for follow-up of dyspnea on exertion and fatigue. She was seen in our office by Dr Yvone Neu on 02/19/17. That time, she reported a 6 month history of shortness of breath with minimal activity. She was referred for transthoracic echocardiogram, exercise myocardial perfusion stress test, and 48 hour Holter monitor.  Today, Kristen Strickland reports feeling about the same. She continues to have shortness of breath with even mild activity such as working in the yard or going up and down a flight of stairs. She is not had any chest pain. She notes mild leg edema that has been present intermittently for over a year. She denies orthopnea and PND. She notes occasional lightheadedness as well as vertigo. Kristen Strickland also continues to have intermittent palpitations during the day, which she describes as a skipped beat or flutter in her chest with associated brief breathlessness. She also notes feeling quite fatigued, having to pace herself as she otherwise "gets wiped out." She has not been sleeping as well as usual and is concerned about possible restless leg syndrome.  --------------------------------------------------------------------------------------------------  Cardiovascular History & Procedures: Cardiovascular Problems:  Dyspnea on exertion  Risk Factors:  Hypertension, hyperlipidemia, and age greater than 36  Cath/PCI:  None  CV Surgery:  None  EP Procedures and Devices:  Holter monitor (02/2017, prelim): Predominantly sinus rhythm with rare PACs and PVCs as well as short atrial runs.  Non-Invasive Evaluation(s):  TTE (03/06/17): Normal LV size and  wall thickness with LVEF of 60-65%. Normal wall motion and diastolic function. Mild MR. Normal RV size and function. Normal pulmonary artery pressure.  Exercise MPI (02/25/17): Low risk study without ischemia. Small in size, mild in severity, fixed basal inferolateral defect that may reflect artifact or subtle scar. LVEF greater than 65%.  Recent CV Pertinent Labs: Lab Results  Component Value Date   CHOL 219 (H) 03/03/2017   HDL 45.60 03/03/2017   LDLCALC 141 (H) 03/03/2017   LDLDIRECT 151.7 08/16/2013   TRIG 165.0 (H) 03/03/2017   CHOLHDL 5 03/03/2017   K 4.0 03/03/2017   BUN 16 03/03/2017   CREATININE 0.69 03/03/2017    Past medical and surgical history were reviewed and updated in EPIC.  Outpatient Encounter Prescriptions as of 03/12/2017  Medication Sig  . amLODipine (NORVASC) 5 MG tablet Take 1 tablet (5 mg total) by mouth daily.  Marland Kitchen aspirin EC 81 MG tablet Take 81 mg by mouth daily.  . Cyanocobalamin (VITAMIN B 12) 100 MCG LOZG Take by mouth daily.  Marland Kitchen estradiol (CLIMARA - DOSED IN MG/24 HR) 0.1 mg/24hr patch APPLY 1 PATCH ON SKIN ONCE WEEKLY.  Marland Kitchen losartan-hydrochlorothiazide (HYZAAR) 100-25 MG tablet Take 1 tablet by mouth daily.  . Multiple Vitamins-Minerals (CENTRUM PO) Take 1 tablet by mouth daily.   Marland Kitchen omeprazole (PRILOSEC) 20 MG capsule TAKE 1 CAPSULE BY MOUTH TWICE DAILY FOR REFLUX.  . pravastatin (PRAVACHOL) 10 MG tablet Take 1 tablet (10 mg total) by mouth daily. Start out taking pravastatin every Monday, Wednesday and Friday  . traMADol (ULTRAM) 50 MG tablet Take 1 tablet (50 mg total) by mouth 2 (two) times daily as needed.  . valACYclovir (VALTREX) 500 MG tablet Take 1 tablet (500 mg total) by mouth 2 (  two) times daily as needed.   No facility-administered encounter medications on file as of 03/12/2017.     Allergies: Patient has no known allergies.  Social History   Social History  . Marital status: Married    Spouse name: N/A  . Number of children: 2  . Years  of education: N/A   Occupational History  . Not on file.   Social History Main Topics  . Smoking status: Never Smoker  . Smokeless tobacco: Never Used  . Alcohol use 0.0 oz/week     Comment: rarely  . Drug use: No  . Sexual activity: Yes   Other Topics Concern  . Not on file   Social History Narrative  . No narrative on file    Family History  Problem Relation Age of Onset  . Cancer Mother        ovarian/uterine  . Heart disease Mother   . Colon cancer Father     Review of Systems: Patient notes discoloration along the left anterior shin where she previously had a wound that took a very long time to heal. Otherwise, a 12-system review of systems was performed and was negative except as noted in the HPI.  --------------------------------------------------------------------------------------------------  Physical Exam: BP 136/60 (BP Location: Left Arm, Patient Position: Sitting, Cuff Size: Normal)   Pulse 69   Ht 5\' 4"  (1.626 m)   Wt 155 lb 4 oz (70.4 kg)   BMI 26.65 kg/m   General:  Well-developed, well-nourished woman, seated comfortably in the exam room. HEENT: No conjunctival pallor or scleral icterus.  Moist mucous membranes.  OP clear. Neck: Supple without lymphadenopathy, thyromegaly, JVD, or HJR.  No carotid bruit. Lungs: Normal work of breathing.  Clear to auscultation bilaterally without wheezes or crackles. Heart: Regular rate and rhythm with 2/6 holosystolic murmur loudest at the left lower sternal border. No rubs or gallops.  Non-displaced PMI. Abd: Bowel sounds present.  Soft, NT/ND without hepatosplenomegaly Ext: Trace LE edema.  Radial, PT, and DP pulses are 2+ bilaterally. Skin: warm and dry without rash  Lab Results  Component Value Date   WBC 5.4 03/03/2017   HGB 12.7 03/03/2017   HCT 37.5 03/03/2017   MCV 89.0 03/03/2017   PLT 258.0 03/03/2017    Lab Results  Component Value Date   NA 136 03/03/2017   K 4.0 03/03/2017   CL 102  03/03/2017   CO2 28 03/03/2017   BUN 16 03/03/2017   CREATININE 0.69 03/03/2017   GLUCOSE 100 (H) 03/03/2017   ALT 22 03/03/2017    Lab Results  Component Value Date   CHOL 219 (H) 03/03/2017   HDL 45.60 03/03/2017   LDLCALC 141 (H) 03/03/2017   LDLDIRECT 151.7 08/16/2013   TRIG 165.0 (H) 03/03/2017   CHOLHDL 5 03/03/2017   --------------------------------------------------------------------------------------------------  ASSESSMENT AND PLAN: Dyspnea on exertion This is likely multifactorial. Cardiac workup thus far has not shown a clear etiology. Holter monitor demonstrated some supraventricular and ventricular ectopy, which the patient equates with intermittent shortness of breath. Her blood pressure is also borderline elevated today. We have agreed to a trial of beta blockade. I will therefore start carvedilol 3.125 mg twice a day. If her symptoms do not improve, she may benefit from pulmonology referral and potential sleep study.  Palpitations These are unchanged and are accompanied by brief sensation of breathlessness. Holter monitor showed rare PACs and PVCs as well as brief atrial runs. We have agreed to a trial of carvedilol 3.125 mg twice a  day.  Hypertension Blood pressure is borderline elevated today at 136/60. Given mild leg edema, we have agreed to decrease amlodipine to 2.5 mg daily and add carvedilol 3.125 mg twice a day.  Follow-up: Return to clinic in 3 months.  Nelva Bush, MD 03/12/2017 3:21 PM

## 2017-03-19 ENCOUNTER — Ambulatory Visit (INDEPENDENT_AMBULATORY_CARE_PROVIDER_SITE_OTHER): Payer: Medicare Other

## 2017-03-19 VITALS — BP 124/64 | HR 55 | Temp 97.8°F | Resp 14 | Ht 64.0 in | Wt 155.4 lb

## 2017-03-19 DIAGNOSIS — Z1331 Encounter for screening for depression: Secondary | ICD-10-CM

## 2017-03-19 DIAGNOSIS — Z1389 Encounter for screening for other disorder: Secondary | ICD-10-CM | POA: Diagnosis not present

## 2017-03-19 DIAGNOSIS — Z0000 Encounter for general adult medical examination without abnormal findings: Secondary | ICD-10-CM | POA: Diagnosis not present

## 2017-03-19 DIAGNOSIS — Z1159 Encounter for screening for other viral diseases: Secondary | ICD-10-CM

## 2017-03-19 NOTE — Progress Notes (Signed)
Subjective:   Kristen Strickland is a 70 y.o. female who presents for Medicare Annual (Subsequent) preventive examination.  Review of Systems:  No ROS.  Medicare Wellness Visit.  Cardiac Risk Factors include: advanced age (>46men, >19 women);hypertension     Objective:     Vitals: BP 124/64 (BP Location: Left Arm, Patient Position: Sitting, Cuff Size: Normal)   Pulse (!) 55   Temp 97.8 F (36.6 C) (Oral)   Resp 14   Ht 5\' 4"  (1.626 m)   Wt 155 lb 6.4 oz (70.5 kg)   SpO2 98%   BMI 26.67 kg/m   Body mass index is 26.67 kg/m.   Tobacco History  Smoking Status  . Never Smoker  Smokeless Tobacco  . Never Used     Counseling given: Not Answered   Past Medical History:  Diagnosis Date  . Arthritis   . Hypercholesterolemia   . Hypertension    Past Surgical History:  Procedure Laterality Date  . ABDOMINAL HYSTERECTOMY  1975  . ANTERIOR CERVICAL DECOMP/DISCECTOMY FUSION N/A 04/09/2013   Procedure: ANTERIOR CERVICAL DECOMPRESSION/DISCECTOMY FUSION 2 LEVELS;  Surgeon: Floyce Stakes, MD;  Location: [facility];  Service: Neurosurgery;  Laterality: N/A;  Cervical five-six Cervical six-seven  Anterior cervical decompression/diskectomy/fusion  . BACK SURGERY     ruptured disc  . RECONSTRUCTION OF NOSE     Family History  Problem Relation Age of Onset  . Cancer Mother        ovarian/uterine  . Heart disease Mother   . Colon cancer Father    History  Sexual Activity  . Sexual activity: Yes    Outpatient Encounter Prescriptions as of 03/19/2017  Medication Sig  . amLODipine (NORVASC) 2.5 MG tablet Take 1 tablet (2.5 mg total) by mouth daily.  Marland Kitchen aspirin EC 81 MG tablet Take 81 mg by mouth daily.  . carvedilol (COREG) 3.125 MG tablet Take 1 tablet (3.125 mg total) by mouth 2 (two) times daily.  . Cyanocobalamin (VITAMIN B 12) 100 MCG LOZG Take by mouth daily.  Marland Kitchen estradiol (CLIMARA - DOSED IN MG/24 HR) 0.1 mg/24hr patch APPLY 1 PATCH ON SKIN ONCE WEEKLY.  Marland Kitchen  losartan-hydrochlorothiazide (HYZAAR) 100-25 MG tablet Take 1 tablet by mouth daily.  . Multiple Vitamins-Minerals (CENTRUM PO) Take 1 tablet by mouth daily.   Marland Kitchen omeprazole (PRILOSEC) 20 MG capsule TAKE 1 CAPSULE BY MOUTH TWICE DAILY FOR REFLUX.  . pravastatin (PRAVACHOL) 10 MG tablet Take 1 tablet (10 mg total) by mouth daily. Start out taking pravastatin every Monday, Wednesday and Friday  . traMADol (ULTRAM) 50 MG tablet Take 1 tablet (50 mg total) by mouth 2 (two) times daily as needed.   No facility-administered encounter medications on file as of 03/19/2017.     Activities of Daily Living In your present state of health, do you have any difficulty performing the following activities: 03/19/2017  Hearing? N  Vision? N  Difficulty concentrating or making decisions? Y  Walking or climbing stairs? Y  Dressing or bathing? N  Doing errands, shopping? N  Preparing Food and eating ? N  Using the Toilet? N  In the past six months, have you accidently leaked urine? N  Do you have problems with loss of bowel control? N  Managing your Medications? N  Managing your Finances? N  Housekeeping or managing your Housekeeping? N  Some recent data might be hidden    Patient Care Team: Einar Pheasant, MD as PCP - General (Internal Medicine)  Assessment:    This is a routine wellness examination for Kristen Strickland. The goal of the wellness visit is to assist the patient how to close the gaps in care and create a preventative care plan for the patient.   Osteoporosis risk reviewed.  Medications reviewed; taking without issues or barriers.  Safety issues reviewed; smoke detectors in the home. No firearms in the home.  Wears seatbelts when driving or riding with others. Patient does wear sunscreen or protective clothing when in direct sunlight. No violence in the home.  Depression- PHQ 2 &9 complete.  No signs/symptoms or verbal communication regarding little pleasure in doing things, feeling  down, depressed or hopeless. No changes in sleeping, energy, eating, concentrating.  No thoughts of self harm or harm towards others.  Time spent on this topic is 8 minutes.   Patient is alert, normal appearance, oriented to person/place/and time. Correctly identified the president of the [place], recall of 1/3 words, and performing simple calculations with little difficulty.  Patient displays appropriate judgement and can read correct time from watch face.  No new identified risk were noted.  No failures at ADL's or IADL's.   BMI- discussed the importance of a healthy diet, water intake and exercise. Educational material provided.   Daily fluid intake: 3 cups of caffeine, 2 cups of water  HTN- followed by PCP.  Dental- every six months.  Dr. Ventura Sellers [facility])  Sleep patterns- Sleeps 4 hours at night.  Nocturia x2. Wakes feeling rested.  Naps during the day.  Colonoscopy discussed.  States it was completed 03/04/14 with Dr. Arther Dames.  Colon polyps hyperplastic.  Repeat colonoscopy in 5 years.  Prevnar 13 vaccine deferred per patient preference. Educational material provided.  Hepatitis C Screening discussed. Lab added on. Educational material provided.    Dexa Scan and mammogram deferred per patient preference.    Patient Concerns: None at this time. Follow up with PCP as needed.  Exercise Activities and Dietary recommendations Current Exercise Habits: The patient does not participate in regular exercise at present  Goals    . Healthy Lifestyle          Stay hydrated and drink plenty of fluids/water. Low carb foods. Lean meats and vegetables. Stay active.  Walk for exercise, as tolerated.      Fall Risk Fall Risk  03/19/2017 03/12/2016 02/08/2015 10/05/2014 08/19/2013  Falls in the past year? No Yes No No Yes  Number falls in past yr: - 1 - - 1  Injury with Fall? - No - - No  Risk for fall due to : - - - - -  Risk for fall due to (comments): - - - - -  Follow up -  Education provided;Falls prevention discussed - - -   Depression Screen PHQ 2/9 Scores 03/19/2017 03/12/2016 02/08/2015 10/05/2014  PHQ - 2 Score 0 0 0 0  PHQ- 9 Score 0 - - -     Cognitive Function MMSE - Mini Mental State Exam 03/19/2017 03/12/2016  Orientation to time 5 5  Orientation to Place 5 5  Registration 3 3  Attention/ Calculation 3 5  Attention/Calculation-comments difficulty with simple calculation -  Recall 1 2  Language- name 2 objects 2 2  Language- repeat 1 1  Language- follow 3 step command 3 3  Language- read & follow direction 1 1  Write a sentence 1 1  Copy design 1 1  Total score 26 29         There is  no immunization history on file for this patient. Screening Tests Health Maintenance  Topic Date Due  . Hepatitis C Screening  10-19-1946  . DEXA SCAN  04/24/2012  . TETANUS/TDAP  04/12/2017 (Originally 04/24/1966)  . MAMMOGRAM  01/31/2018 (Originally 03/30/2015)  . PNA vac Low Risk Adult (1 of 2 - PCV13) 01/31/2018 (Originally 04/24/2012)  . INFLUENZA VACCINE  05/21/2017  . COLONOSCOPY  03/04/2024      Plan:   End of life planning; Advanced aging; Advanced directives discussed.  No HCPOA/Living Will.  Additional information declined at this time.  I have personally reviewed and noted the following in the patient's chart:   . Medical and social history . Use of alcohol, tobacco or illicit drugs  . Current medications and supplements . Functional ability and status . Nutritional status . Physical activity . Advanced directives . List of other physicians . Hospitalizations, surgeries, and ER visits in previous 12 months . Vitals . Screenings to include cognitive, depression, and falls . Referrals and appointments  In addition, I have reviewed and discussed with patient certain preventive protocols, quality metrics, and best practice recommendations. A written personalized care plan for preventive services as well as general preventive health  recommendations were provided to patient.     OBrien-Blaney, Mindy Behnken L, LPN  01/17/761   Reviewed above information.  Agree with plan.  Dr Nicki Reaper

## 2017-03-19 NOTE — Patient Instructions (Addendum)
  Kristen Strickland , Thank you for taking time to come for your Medicare Wellness Visit. I appreciate your ongoing commitment to your health goals. Please review the following plan we discussed and let me know if I can assist you in the future.   Follow up with Dr. Nicki Reaper as needed.    Have a great day!  These are the goals we discussed: Goals    . Healthy Lifestyle          Stay hydrated and drink plenty of fluids/water. Low carb foods. Lean meats and vegetables. Stay active.  Walk for exercise, as tolerated.       This is a list of the screening recommended for you and due dates:  Health Maintenance  Topic Date Due  .  Hepatitis C: One time screening is recommended by Center for Disease Control  (CDC) for  adults born from [birth year] through 1964.   April 13, 1946  . DEXA scan (bone density measurement)  04/24/2012  . Tetanus Vaccine  04/12/2017*  . Mammogram  01/31/2018*  . Pneumonia vaccines (1 of 2 - PCV13) 01/31/2018*  . Flu Shot  05/21/2017  . Colon Cancer Screening  03/04/2024  *Topic was postponed. The date shown is not the original due date.

## 2017-04-16 ENCOUNTER — Other Ambulatory Visit (INDEPENDENT_AMBULATORY_CARE_PROVIDER_SITE_OTHER): Payer: Medicare Other

## 2017-04-16 DIAGNOSIS — E7800 Pure hypercholesterolemia, unspecified: Secondary | ICD-10-CM | POA: Diagnosis not present

## 2017-04-16 DIAGNOSIS — Z1159 Encounter for screening for other viral diseases: Secondary | ICD-10-CM | POA: Diagnosis not present

## 2017-04-16 LAB — HEPATIC FUNCTION PANEL
ALT: 22 U/L (ref 0–35)
AST: 16 U/L (ref 0–37)
Albumin: 4.1 g/dL (ref 3.5–5.2)
Alkaline Phosphatase: 63 U/L (ref 39–117)
Bilirubin, Direct: 0.1 mg/dL (ref 0.0–0.3)
Total Bilirubin: 0.8 mg/dL (ref 0.2–1.2)
Total Protein: 6.6 g/dL (ref 6.0–8.3)

## 2017-04-17 LAB — HEPATITIS C ANTIBODY: HCV Ab: REACTIVE — AB

## 2017-04-18 LAB — HEPATITIS C RNA QUANTITATIVE
HCV Quantitative Log: <1.18 Log IU/mL
HCV Quantitative: <15 IU/mL

## 2017-04-22 IMAGING — XA Imaging study
2 series · 2 of 2 positions shown · non-contrast
Comparison: none

CLINICAL DATA: Lumbosacral spondylosis without myelopathy with
radiculopathy. Predominantly low back pain with some left leg pain
as well.

[Series 2: ortho standard · 1 of 1 slices shown (1 of 2)]
[im 1/1]
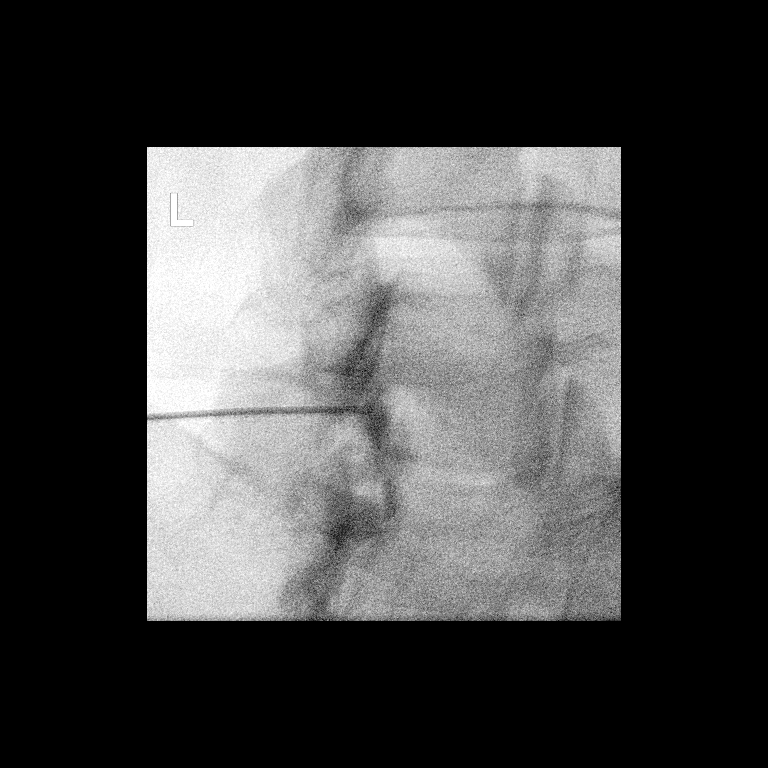

[Series 3: ortho standard · 1 of 1 slices shown (2 of 2)]
[im 1/1]
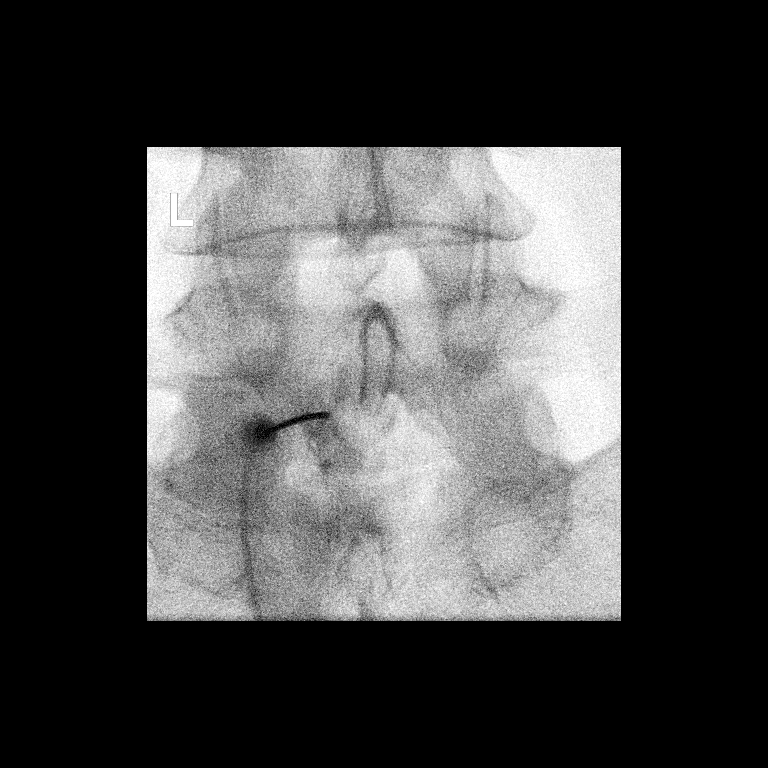

[2 of 2 positions shown; findings below may reference images not displayed]

FLUOROSCOPY TIME:  Radiation Exposure Index (as provided by the
fluoroscopic device): 11.17 microGray*m^2

Fluoroscopy Time (in minutes and seconds):  13 seconds

PROCEDURE:
The procedure, risks, benefits, and alternatives were explained to
the patient. Questions regarding the procedure were encouraged and
answered. The patient understands and consents to the procedure.

LUMBAR EPIDURAL INJECTION:

An interlaminar approach was performed on the left at L5-S1. The
overlying skin was cleansed and anesthetized. A 3.5 inch 20 gauge
epidural needle was advanced using loss-of-resistance technique.

DIAGNOSTIC EPIDURAL INJECTION:

Injection of Isovue-M 200 shows a good epidural pattern with spread
above and below the level of needle placement, primarily on the
left. No vascular opacification is seen.

THERAPEUTIC EPIDURAL INJECTION:

120 mg of Depo-Medrol mixed with 3 mL of 1% lidocaine were
instilled. The procedure was well-tolerated, and the patient was
discharged thirty minutes following the injection in good condition.

COMPLICATIONS:
None
IMPRESSION: Technically successful lumbar interlaminar epidural injection on the
left at L5-S1.

## 2017-05-06 ENCOUNTER — Other Ambulatory Visit: Payer: Self-pay | Admitting: Internal Medicine

## 2017-05-15 ENCOUNTER — Telehealth: Payer: Self-pay | Admitting: Internal Medicine

## 2017-05-15 MED ORDER — TRAMADOL HCL 50 MG PO TABS: 50.0000 mg | ORAL_TABLET | Freq: Two times a day (BID) | ORAL | 1 refills | Status: DC | PRN

## 2017-05-15 NOTE — Telephone Encounter (Signed)
Pt called about needing a refill for traMADol (ULTRAM) 50 MG tablet. Pt has 1 pill for today and 1 for tomorrow night.  Pharmacy is [facility], [place]  Call pt @ [phone]. Thank you!

## 2017-05-15 NOTE — Telephone Encounter (Signed)
ok'd refill for tramadol #60 with one refill.

## 2017-05-15 NOTE — Telephone Encounter (Signed)
Last script and o/v was 01/31/17 #60 no rf. No follow up at this time.

## 2017-05-15 NOTE — Telephone Encounter (Signed)
Faxed to pharmacy pt informed

## 2017-05-29 ENCOUNTER — Encounter: Payer: Self-pay | Admitting: Internal Medicine

## 2017-05-29 ENCOUNTER — Ambulatory Visit (INDEPENDENT_AMBULATORY_CARE_PROVIDER_SITE_OTHER): Payer: Medicare Other | Admitting: Internal Medicine

## 2017-05-29 DIAGNOSIS — E7800 Pure hypercholesterolemia, unspecified: Secondary | ICD-10-CM

## 2017-05-29 DIAGNOSIS — M545 Low back pain, unspecified: Secondary | ICD-10-CM

## 2017-05-29 DIAGNOSIS — I6523 Occlusion and stenosis of bilateral carotid arteries: Secondary | ICD-10-CM

## 2017-05-29 DIAGNOSIS — R42 Dizziness and giddiness: Secondary | ICD-10-CM

## 2017-05-29 DIAGNOSIS — I10 Essential (primary) hypertension: Secondary | ICD-10-CM

## 2017-05-29 NOTE — Progress Notes (Signed)
Patient ID: Kristen Strickland, female   DOB: 1946/09/27, 71 y.o.   MRN: [id number]   Subjective:    Patient ID: Kristen Strickland, female    DOB: 1946-09-09, 71 y.o.   MRN: [id number]  HPI  Patient here for a scheduled follow up.  She overall feels better.  Has not noticed increased heart rate or palpitations.  Her biggest complaint now is her back.  Bothers her when she lies down at night or rolls over in bed.  No chest pain.  Breathing stable.  Stays active.  She is noticing issues with choking - when eats.  Discussed with her today.  Taking prilosec daily.  Will increase to bid.  Discussed the need for further testing given persistent choking.  She declines.  Wants to follow.  No abdominal pain.  Bowels moving.  No urine change.  Plans to f/u with Dr Joya Salm for her back.  No significant problems with dizziness now.  Previously saw Dr Tami Ribas.     Past Medical History:  Diagnosis Date  . Arthritis   . Hypercholesterolemia   . Hypertension    Past Surgical History:  Procedure Laterality Date  . ABDOMINAL HYSTERECTOMY  1975  . ANTERIOR CERVICAL DECOMP/DISCECTOMY FUSION N/A 04/09/2013   Procedure: ANTERIOR CERVICAL DECOMPRESSION/DISCECTOMY FUSION 2 LEVELS;  Surgeon: Floyce Stakes, MD;  Location: [facility];  Service: Neurosurgery;  Laterality: N/A;  Cervical five-six Cervical six-seven  Anterior cervical decompression/diskectomy/fusion  . BACK SURGERY     ruptured disc  . RECONSTRUCTION OF NOSE     Family History  Problem Relation Age of Onset  . Cancer Mother        ovarian/uterine  . Heart disease Mother   . Colon cancer Father    Social History   Social History  . Marital status: Married    Spouse name: N/A  . Number of children: 2  . Years of education: N/A   Social History Main Topics  . Smoking status: Never Smoker  . Smokeless tobacco: Never Used  . Alcohol use 0.0 oz/week     Comment: rarely  . Drug use: No  . Sexual activity: Yes   Other Topics Concern  . None    Social History Narrative  . None    Outpatient Encounter Prescriptions as of 05/29/2017  Medication Sig  . amLODipine (NORVASC) 2.5 MG tablet Take 1 tablet (2.5 mg total) by mouth daily.  Marland Kitchen aspirin EC 81 MG tablet Take 81 mg by mouth daily.  . carvedilol (COREG) 3.125 MG tablet Take 1 tablet (3.125 mg total) by mouth 2 (two) times daily.  . Cyanocobalamin (VITAMIN B 12) 100 MCG LOZG Take by mouth daily.  Marland Kitchen estradiol (CLIMARA - DOSED IN MG/24 HR) 0.1 mg/24hr patch APPLY 1 PATCH ON SKIN ONCE WEEKLY.  Marland Kitchen losartan-hydrochlorothiazide (HYZAAR) 100-25 MG tablet Take 1 tablet by mouth daily.  . Multiple Vitamins-Minerals (CENTRUM PO) Take 1 tablet by mouth daily.   Marland Kitchen omeprazole (PRILOSEC) 20 MG capsule TAKE 1 CAPSULE BY MOUTH TWICE DAILY FOR REFLUX.  . pravastatin (PRAVACHOL) 10 MG tablet TAKE 1 TABLET BY MOUTH EVERY MONDAY, WEDNESDAY, AND FRIDAY  . traMADol (ULTRAM) 50 MG tablet Take 1 tablet (50 mg total) by mouth 2 (two) times daily as needed.   No facility-administered encounter medications on file as of 05/29/2017.     Review of Systems  Constitutional: Negative for appetite change and unexpected weight change.  HENT: Negative for congestion and sinus pressure.   Respiratory: Negative  for cough, chest tightness and shortness of breath.   Cardiovascular: Negative for chest pain, palpitations and leg swelling.       No increased heart rate or palpitations now.  Better.    Gastrointestinal: Negative for abdominal pain, diarrhea, nausea and vomiting.  Genitourinary: Negative for difficulty urinating and dysuria.  Musculoskeletal: Positive for back pain. Negative for joint swelling.  Skin: Negative for color change and rash.  Neurological: Negative for headaches.       Dizziness better.    Psychiatric/Behavioral: Negative for agitation and dysphoric mood.       Objective:    Physical Exam  Constitutional: She appears well-developed and well-nourished. No distress.  HENT:  Nose: Nose  normal.  Mouth/Throat: Oropharynx is clear and moist.  Neck: Neck supple. No thyromegaly present.  Cardiovascular: Normal rate and regular rhythm.   Pulmonary/Chest: Breath sounds normal. No respiratory distress. She has no wheezes.  Abdominal: Soft. Bowel sounds are normal. There is no tenderness.  Musculoskeletal: She exhibits no edema or tenderness.  Lymphadenopathy:    She has no cervical adenopathy.  Skin: No rash noted. No erythema.  Psychiatric: She has a normal mood and affect. Her behavior is normal.    BP 128/62 (BP Location: Left Arm, Patient Position: Sitting, Cuff Size: Normal)   Pulse (!) 55   Temp 98.5 F (36.9 C) (Oral)   Resp 18   Ht 5\' 4"  (1.626 m)   Wt 154 lb 3.2 oz (69.9 kg)   SpO2 98%   BMI 26.47 kg/m  Wt Readings from Last 3 Encounters:  05/29/17 154 lb 3.2 oz (69.9 kg)  03/19/17 155 lb 6.4 oz (70.5 kg)  03/12/17 155 lb 4 oz (70.4 kg)     Lab Results  Component Value Date   WBC 5.4 03/03/2017   HGB 12.7 03/03/2017   HCT 37.5 03/03/2017   PLT 258.0 03/03/2017   GLUCOSE 100 (H) 03/03/2017   CHOL 219 (H) 03/03/2017   TRIG 165.0 (H) 03/03/2017   HDL 45.60 03/03/2017   LDLDIRECT 151.7 08/16/2013   LDLCALC 141 (H) 03/03/2017   ALT 22 04/16/2017   AST 16 04/16/2017   NA 136 03/03/2017   K 4.0 03/03/2017   CL 102 03/03/2017   CREATININE 0.69 03/03/2017   BUN 16 03/03/2017   CO2 28 03/03/2017   TSH 4.360 03/03/2017       Assessment & Plan:   Problem List Items Addressed This Visit    Back pain    Previously saw Dr Joya Salm.  S/p injection.  Was better for a while.  Increased pain now as well.  Plans to f/u with Dr Joya Salm.        Carotid stenosis    Follow by [facility].        Dizziness    holter with SVT/ventricular ectopy.  Started on carvedilol.  No increased heart rate or palpitations.  Dizziness better.  Follow.        Hypercholesteremia    On pravastatin.  Low cholesterol diet and exercise.  Follow lipid panel and liver function tests.         Relevant Orders   Hepatic function panel   Lipid panel   Hypertension    Blood pressure under good control.  Continue same medication regimen.  Follow pressures.  Follow metabolic panel.        Relevant Orders   Basic metabolic panel       Einar Pheasant, MD

## 2017-05-31 ENCOUNTER — Encounter: Payer: Self-pay | Admitting: Internal Medicine

## 2017-05-31 NOTE — Assessment & Plan Note (Signed)
holter with SVT/ventricular ectopy.  Started on carvedilol.  No increased heart rate or palpitations.  Dizziness better.  Follow.

## 2017-05-31 NOTE — Assessment & Plan Note (Signed)
Previously saw Dr Joya Salm.  S/p injection.  Was better for a while.  Increased pain now as well.  Plans to f/u with Dr Joya Salm.

## 2017-05-31 NOTE — Assessment & Plan Note (Signed)
Blood pressure under good control.  Continue same medication regimen.  Follow pressures.  Follow metabolic panel.   

## 2017-05-31 NOTE — Assessment & Plan Note (Signed)
On pravastatin.  Low cholesterol diet and exercise.  Follow lipid panel and liver function tests.   

## 2017-05-31 NOTE — Assessment & Plan Note (Signed)
Follow by [facility].

## 2017-06-11 ENCOUNTER — Encounter: Payer: Self-pay | Admitting: Internal Medicine

## 2017-06-11 ENCOUNTER — Ambulatory Visit (INDEPENDENT_AMBULATORY_CARE_PROVIDER_SITE_OTHER): Payer: Medicare Other | Admitting: Internal Medicine

## 2017-06-11 VITALS — BP 140/80 | HR 68 | Ht 64.0 in | Wt 154.5 lb

## 2017-06-11 DIAGNOSIS — M79604 Pain in right leg: Secondary | ICD-10-CM

## 2017-06-11 DIAGNOSIS — R0609 Other forms of dyspnea: Secondary | ICD-10-CM

## 2017-06-11 DIAGNOSIS — R0600 Dyspnea, unspecified: Secondary | ICD-10-CM

## 2017-06-11 DIAGNOSIS — I10 Essential (primary) hypertension: Secondary | ICD-10-CM | POA: Diagnosis not present

## 2017-06-11 DIAGNOSIS — M79605 Pain in left leg: Secondary | ICD-10-CM | POA: Diagnosis not present

## 2017-06-11 DIAGNOSIS — R002 Palpitations: Secondary | ICD-10-CM | POA: Diagnosis not present

## 2017-06-11 NOTE — Progress Notes (Signed)
Follow-up Outpatient Visit Date: 06/11/2017  Primary Care Provider: Einar Pheasant, [place] [address] [id number]  Chief Complaint: shortness of breath  HPI:  Ms. Kristen Strickland is a 71 y.o. year-old female with history of degenerative disc disease, hypertension, carotid artery stenosis, and hyperlipidemia, who presents for follow-up with palpitations and shortness of breath. I last saw her in May, which time we added low-dose carvedilol. Today, Ms. [name] reports that her palpitations have resolved. She continues to have exertional dyspnea with modest activity. This seems to be stable. She has not had any chest pain or lightheadedness. She also denies orthopnea, PND, and edema. Ms. [name] has chronic back pain and intermittent leg discomfort, which is not clearly related to activity. She remains compliant with her medications. She has not been checking her blood pressures regularly at home, but notes that BP was normal at her recent visit with Dr. Nicki Reaper.  --------------------------------------------------------------------------------------------------  Cardiovascular History & Procedures: Cardiovascular Problems:  Dyspnea on exertion  Palpitations  Risk Factors:  Hypertension, hyperlipidemia, and age greater than 65  Cath/PCI:  None  CV Surgery:  None  EP Procedures and Devices:  Holter monitor (02/2017, prelim): Predominantly sinus rhythm with rare PACs and PVCs as well as short atrial runs.  Non-Invasive Evaluation(s):  TTE (03/06/17): Normal LV size and wall thickness with LVEF of 60-65%. Normal wall motion and diastolic function. Mild MR. Normal RV size and function. Normal pulmonary artery pressure.  Exercise MPI (02/25/17): Low risk study without ischemia. Small in size, mild in severity, fixed basal inferolateral defect that may reflect artifact or subtle scar. LVEF greater than 65%.  Recent CV Pertinent Labs: Lab Results  Component Value  Date   CHOL 219 (H) 03/03/2017   HDL 45.60 03/03/2017   LDLCALC 141 (H) 03/03/2017   LDLDIRECT 151.7 08/16/2013   TRIG 165.0 (H) 03/03/2017   CHOLHDL 5 03/03/2017   K 4.0 03/03/2017   BUN 16 03/03/2017   CREATININE 0.69 03/03/2017   Past medical and surgical history were reviewed and updated in EPIC.  Current Meds  Medication Sig  . amLODipine (NORVASC) 2.5 MG tablet Take 1 tablet (2.5 mg total) by mouth daily.  Marland Kitchen aspirin EC 81 MG tablet Take 81 mg by mouth daily.  . carvedilol (COREG) 3.125 MG tablet Take 1 tablet (3.125 mg total) by mouth 2 (two) times daily.  . Cyanocobalamin (VITAMIN B 12) 100 MCG LOZG Take by mouth daily.  Marland Kitchen losartan-hydrochlorothiazide (HYZAAR) 100-25 MG tablet Take 1 tablet by mouth daily.  . Multiple Vitamins-Minerals (CENTRUM PO) Take 1 tablet by mouth daily.   Marland Kitchen omeprazole (PRILOSEC) 20 MG capsule TAKE 1 CAPSULE BY MOUTH TWICE DAILY FOR REFLUX.  . pravastatin (PRAVACHOL) 10 MG tablet TAKE 1 TABLET BY MOUTH EVERY MONDAY, WEDNESDAY, AND FRIDAY  . traMADol (ULTRAM) 50 MG tablet Take 1 tablet (50 mg total) by mouth 2 (two) times daily as needed.    Allergies: Patient has no known allergies.  Social History   Social History  . Marital status: Married    Spouse name: N/A  . Number of children: 2  . Years of education: N/A   Occupational History  . Not on file.   Social History Main Topics  . Smoking status: Never Smoker  . Smokeless tobacco: Never Used  . Alcohol use 0.0 oz/week     Comment: rarely  . Drug use: No  . Sexual activity: Yes   Other Topics Concern  . Not on file  Social History Narrative  . No narrative on file    Family History  Problem Relation Age of Onset  . Cancer Mother        ovarian/uterine  . Heart disease Mother   . Colon cancer Father     Review of Systems: Review of Systems  Constitutional: Positive for malaise/fatigue.  HENT: Negative.   Eyes: Negative.   Respiratory: Positive for shortness of breath  (with exertion).   Cardiovascular: Negative.   Gastrointestinal: Negative.   Genitourinary: Negative.   Musculoskeletal: Positive for back pain, joint pain and myalgias.  Skin: Negative.   Neurological: Negative.   Endo/Heme/Allergies: Negative.    --------------------------------------------------------------------------------------------------  Physical Exam: BP 140/80 (BP Location: Left Arm, Patient Position: Sitting, Cuff Size: Normal)   Pulse 68   Ht 5\' 4"  (1.626 m)   Wt 154 lb 8 oz (70.1 kg)   BMI 26.52 kg/m    General:  Well-developed, well-nourished woman, seated comfortably in the exam room. HEENT: No conjunctival pallor or scleral icterus.  Moist mucous membranes.  OP clear. Neck: Supple without lymphadenopathy, thyromegaly, JVD, or HJR. Lungs: Normal work of breathing.  Clear to auscultation bilaterally without wheezes or crackles. Heart: Regular rate and rhythm without murmurs, rubs, or gallops.  Non-displaced PMI. Abd: Bowel sounds present.  Soft, NT/ND without hepatosplenomegaly Ext: No lower extremity edema.  Radial, PT, and DP pulses are 2+ bilaterally. Skin: Warm and dry without rash.  Lab Results  Component Value Date   WBC 5.4 03/03/2017   HGB 12.7 03/03/2017   HCT 37.5 03/03/2017   MCV 89.0 03/03/2017   PLT 258.0 03/03/2017    Lab Results  Component Value Date   NA 136 03/03/2017   K 4.0 03/03/2017   CL 102 03/03/2017   CO2 28 03/03/2017   BUN 16 03/03/2017   CREATININE 0.69 03/03/2017   GLUCOSE 100 (H) 03/03/2017   ALT 22 04/16/2017    Lab Results  Component Value Date   CHOL 219 (H) 03/03/2017   HDL 45.60 03/03/2017   LDLCALC 141 (H) 03/03/2017   LDLDIRECT 151.7 08/16/2013   TRIG 165.0 (H) 03/03/2017   CHOLHDL 5 03/03/2017   --------------------------------------------------------------------------------------------------  ASSESSMENT AND PLAN: Dyspnea on exertion Symptoms are stable. Cardiac workup thus far is notable only for fixed  basal inferolateral defect on stress test that may represent subtle scar versus artifact. Echo was normal. Patient is tolerating low-dose carvedilol well. No further interventions at this time. If dyspnea persists, consider referral to pulmonology.  Palpitations PACs and PVCs observed on prior monitor. Symptoms resolved with carvedilol. No further interventions.  Hypertension Blood pressure is mildly elevated today. I asked Ms. Petko did check her blood pressures at home and to alert [place] if they are consistently above 140/90. We will continue her current medications.  Leg pain Symptoms are not typical for claudication. Pedal pulses are palpable on exam today. I suspect that her discomfort is more related to back pain and arthritis rather than PAD.  Follow-up: Return to clinic in 6 months.  Nelva Bush, MD 06/11/2017 9:10 AM

## 2017-06-11 NOTE — Patient Instructions (Signed)
Medication Instructions:  Your physician recommends that you continue on your current medications as directed. Please refer to the Current Medication list given to you today.   Labwork: none  Testing/Procedures: none  Follow-Up: Your physician wants you to follow-up in: 6 MONTHS WITH DR END. You will receive a reminder letter in the mail two months in advance. If you don't receive a letter, please call our office to schedule the follow-up appointment.   Your physician has requested that you regularly monitor and record your blood pressure readings at home. Please use the same machine at the same time of day to check your readings and record them to bring to your follow-up visit.  - Please call our office if you consistently have blood pressure greater than 140/90. - If you continue to have shortness of breath, please let us know as we may refer you to Pulmonology.    If you need a refill on your cardiac medications before your next appointment, please call your pharmacy.

## 2017-06-12 DIAGNOSIS — M5416 Radiculopathy, lumbar region: Secondary | ICD-10-CM | POA: Diagnosis not present

## 2017-06-16 ENCOUNTER — Other Ambulatory Visit: Payer: Self-pay | Admitting: Neurosurgery

## 2017-06-16 DIAGNOSIS — M5416 Radiculopathy, lumbar region: Secondary | ICD-10-CM

## 2017-06-25 ENCOUNTER — Ambulatory Visit
Admission: RE | Admit: 2017-06-25 | Discharge: 2017-06-25 | Disposition: A | Discharge status: Home / Self Care | Payer: Medicare Other | Source: Ambulatory Visit | Attending: Neurosurgery | Admitting: Neurosurgery

## 2017-06-25 DIAGNOSIS — M545 Low back pain: Secondary | ICD-10-CM | POA: Diagnosis not present

## 2017-06-25 DIAGNOSIS — M5416 Radiculopathy, lumbar region: Secondary | ICD-10-CM

## 2017-06-25 MED ORDER — IOPAMIDOL (ISOVUE-M 200) INJECTION 41%
1.0000 mL | Freq: Once | INTRAMUSCULAR | Status: AC
  Administered 2017-06-25: 1 mL via EPIDURAL

## 2017-06-25 MED ORDER — METHYLPREDNISOLONE ACETATE 40 MG/ML INJ SUSP (RADIOLOG
120.0000 mg | Freq: Once | INTRAMUSCULAR | Status: AC
  Administered 2017-06-25: 120 mg via EPIDURAL

## 2017-06-25 NOTE — Discharge Instructions (Signed)

## 2017-07-02 ENCOUNTER — Other Ambulatory Visit (INDEPENDENT_AMBULATORY_CARE_PROVIDER_SITE_OTHER): Payer: Medicare Other

## 2017-07-02 DIAGNOSIS — I10 Essential (primary) hypertension: Secondary | ICD-10-CM | POA: Diagnosis not present

## 2017-07-02 DIAGNOSIS — E7800 Pure hypercholesterolemia, unspecified: Secondary | ICD-10-CM

## 2017-07-02 LAB — BASIC METABOLIC PANEL
BUN: 16 mg/dL (ref 6–23)
CO2: 28 mEq/L (ref 19–32)
Calcium: 9.7 mg/dL (ref 8.4–10.5)
Chloride: 103 mEq/L (ref 96–112)
Creatinine, Ser: 0.75 mg/dL (ref 0.40–1.20)
GFR: 81.15 mL/min (ref >60.00)
Glucose, Bld: 95 mg/dL (ref 70–99)
Potassium: 4.1 mEq/L (ref 3.5–5.1)
Sodium: 140 mEq/L (ref 135–145)

## 2017-07-02 LAB — LIPID PANEL
Cholesterol: 198 mg/dL (ref 0–200)
HDL: 62.7 mg/dL (ref >39.00)
LDL Cholesterol: 103 mg/dL — ABNORMAL HIGH (ref 0–99)
NonHDL: 135.26
Total CHOL/HDL Ratio: 3
Triglycerides: 159 mg/dL — ABNORMAL HIGH (ref 0.0–149.0)
VLDL: 31.8 mg/dL (ref 0.0–40.0)

## 2017-07-02 LAB — HEPATIC FUNCTION PANEL
ALT: 24 U/L (ref 0–35)
AST: 15 U/L (ref 0–37)
Albumin: 3.9 g/dL (ref 3.5–5.2)
Alkaline Phosphatase: 60 U/L (ref 39–117)
Bilirubin, Direct: 0.1 mg/dL (ref 0.0–0.3)
Total Bilirubin: 0.6 mg/dL (ref 0.2–1.2)
Total Protein: 6.5 g/dL (ref 6.0–8.3)

## 2017-07-03 ENCOUNTER — Telehealth: Payer: Self-pay | Admitting: *Deleted

## 2017-07-03 NOTE — Telephone Encounter (Signed)
See lab results.  

## 2017-07-03 NOTE — Telephone Encounter (Signed)
Pt requested lab results  Pt contact [phone]

## 2017-07-16 ENCOUNTER — Telehealth: Payer: Self-pay | Admitting: Internal Medicine

## 2017-07-16 NOTE — Telephone Encounter (Signed)
The patient wants  a prescription for estradiol patches Dosed in Mg/24Hr. The patient stated that she can't stand the hot flashes. She also informed me that she had a few in her house from a previous prescription and she is using those until her prescription is filled.

## 2017-07-17 NOTE — Telephone Encounter (Signed)
Last time script was given was 12/12/16 Last o/v 05/29/17 F/u 09/02/17

## 2017-07-17 NOTE — Telephone Encounter (Signed)
Need to know how often she is using.  Would like for her to be off patch.  May need to taper.

## 2017-07-17 NOTE — Telephone Encounter (Signed)
Left message to return call to our office.  

## 2017-07-18 NOTE — Telephone Encounter (Signed)
Patient states that she restarted patches about 2 weeks ago. She is using 1 every 7 days. She states that she does not want to go off. She has tried three times and she does not get any sleep when she if off of it for the symptoms.

## 2017-07-18 NOTE — Telephone Encounter (Signed)
Given her history of elevated blood pressure, following carotids, etc - I would prefer her to remain off the patch since she has been off.  Can schedule an appt to discuss.

## 2017-07-18 NOTE — Telephone Encounter (Signed)
Patient informed app made for 07/25/17 will call if any problems

## 2017-07-25 ENCOUNTER — Ambulatory Visit: Payer: Medicare Other | Admitting: Internal Medicine

## 2017-08-16 ENCOUNTER — Other Ambulatory Visit: Payer: Self-pay | Admitting: Internal Medicine

## 2017-09-02 ENCOUNTER — Encounter: Payer: Self-pay | Admitting: Internal Medicine

## 2017-09-02 ENCOUNTER — Ambulatory Visit (INDEPENDENT_AMBULATORY_CARE_PROVIDER_SITE_OTHER): Payer: Medicare Other | Admitting: Internal Medicine

## 2017-09-02 VITALS — BP 150/80 | HR 65 | Temp 97.6°F | Resp 14 | Ht 64.17 in | Wt 156.6 lb

## 2017-09-02 DIAGNOSIS — R0609 Other forms of dyspnea: Secondary | ICD-10-CM | POA: Diagnosis not present

## 2017-09-02 DIAGNOSIS — M545 Low back pain, unspecified: Secondary | ICD-10-CM

## 2017-09-02 DIAGNOSIS — N951 Menopausal and female climacteric states: Secondary | ICD-10-CM

## 2017-09-02 DIAGNOSIS — Z1231 Encounter for screening mammogram for malignant neoplasm of breast: Secondary | ICD-10-CM | POA: Diagnosis not present

## 2017-09-02 DIAGNOSIS — I10 Essential (primary) hypertension: Secondary | ICD-10-CM | POA: Diagnosis not present

## 2017-09-02 DIAGNOSIS — I6523 Occlusion and stenosis of bilateral carotid arteries: Secondary | ICD-10-CM

## 2017-09-02 DIAGNOSIS — Z0000 Encounter for general adult medical examination without abnormal findings: Secondary | ICD-10-CM

## 2017-09-02 DIAGNOSIS — R002 Palpitations: Secondary | ICD-10-CM | POA: Diagnosis not present

## 2017-09-02 DIAGNOSIS — E7800 Pure hypercholesterolemia, unspecified: Secondary | ICD-10-CM

## 2017-09-02 DIAGNOSIS — R0600 Dyspnea, unspecified: Secondary | ICD-10-CM

## 2017-09-02 DIAGNOSIS — Z1239 Encounter for other screening for malignant neoplasm of breast: Secondary | ICD-10-CM

## 2017-09-02 NOTE — Progress Notes (Signed)
Patient ID: Kristen Strickland, female   DOB: May 09, 1946, 71 y.o.   MRN: [id number]   Subjective:    Patient ID: Kristen Strickland, female    DOB: 11/11/1945, 71 y.o.   MRN: [id number]  HPI  Patient with past history of hypercholesterolemia, DDD, and hypertension.  She comes in today to follow up on these issues as well as for a complete physical exam. She is s/p lumbar injection.  Back is better.  Not taking tramadol.  The injection also helped her shoulder and her knee.  She will notify them when she feels she needs f/u.  Overall improved.  Still with some fatigue.  She saw cardiology.  Stress test with inf/lateral scar vs infarct.  Carvedilol was added.  This helped with palpitations.  She still reports the fatigue and feeling give out at times when walks up stairs. (occurs intermittently).  Cardiology felt no further cardiac w/up warranted.  Recommended pulmonary w/up if symptoms persisted.  Discussed with her today.  She wants to postpone further w/up.  No cough or congestion.  No acid reflux.  No abdominal pain.  Bowels moving.  She wants to go back on her estrogen.  States has tried coming off and does not feel good.  Discussed risk of estrogen therapy.  Desires to restart.     Past Medical History:  Diagnosis Date  . Arthritis   . Hypercholesterolemia   . Hypertension    Past Surgical History:  Procedure Laterality Date  . ABDOMINAL HYSTERECTOMY  1975  . BACK SURGERY     ruptured disc  . RECONSTRUCTION OF NOSE     Family History  Problem Relation Age of Onset  . Cancer Mother        ovarian/uterine  . Heart disease Mother   . Colon cancer Father    Social History   Socioeconomic History  . Marital status: Married    Spouse name: None  . Number of children: 2  . Years of education: None  . Highest education level: None  Social Needs  . Financial resource strain: None  . Food insecurity - worry: None  . Food insecurity - inability: None  . Transportation needs - medical: None    . Transportation needs - non-medical: None  Occupational History  . None  Tobacco Use  . Smoking status: Never Smoker  . Smokeless tobacco: Never Used  Substance and Sexual Activity  . Alcohol use: Yes    Alcohol/week: 0.0 oz    Comment: rarely  . Drug use: No  . Sexual activity: Yes  Other Topics Concern  . None  Social History Narrative  . None    Outpatient Encounter Medications as of 09/02/2017  Medication Sig  . aspirin EC 81 MG tablet Take 81 mg by mouth daily.  . Cyanocobalamin (VITAMIN B 12) 100 MCG LOZG Take by mouth daily.  Marland Kitchen losartan-hydrochlorothiazide (HYZAAR) 100-25 MG tablet TAKE 1 TABLET BY MOUTH ONCE DAILY.  . Multiple Vitamins-Minerals (CENTRUM PO) Take 1 tablet by mouth daily.   Marland Kitchen omeprazole (PRILOSEC) 20 MG capsule TAKE 1 CAPSULE BY MOUTH TWICE DAILY FOR REFLUX.  . pravastatin (PRAVACHOL) 10 MG tablet TAKE 1 TABLET BY MOUTH EVERY MONDAY, WEDNESDAY, AND FRIDAY  . traMADol (ULTRAM) 50 MG tablet Take 1 tablet (50 mg total) by mouth 2 (two) times daily as needed.  Marland Kitchen estradiol (CLIMARA) 0.1 mg/24hr patch Place 1 patch (0.1 mg total) once a week onto the skin.  . [DISCONTINUED] amLODipine (NORVASC) 2.5 MG tablet  Take 1 tablet (2.5 mg total) by mouth daily.  . [DISCONTINUED] carvedilol (COREG) 3.125 MG tablet Take 1 tablet (3.125 mg total) by mouth 2 (two) times daily.   No facility-administered encounter medications on file as of 09/02/2017.   ]  Review of Systems  Constitutional: Positive for fatigue. Negative for appetite change and unexpected weight change.  HENT: Negative for congestion and sinus pressure.   Eyes: Negative for pain and visual disturbance.  Respiratory: Negative for cough, chest tightness and shortness of breath.   Cardiovascular: Negative for chest pain, palpitations and leg swelling.  Gastrointestinal: Negative for abdominal pain, diarrhea, nausea and vomiting.  Genitourinary: Negative for difficulty urinating and dysuria.   Musculoskeletal: Positive for back pain. Negative for joint swelling.  Skin: Negative for color change and rash.  Neurological: Negative for dizziness, light-headedness and headaches.  Hematological: Negative for adenopathy. Does not bruise/bleed easily.  Psychiatric/Behavioral: Negative for agitation and dysphoric mood.       Objective:    Physical Exam  Constitutional: She is oriented to person, place, and time. She appears well-developed and well-nourished. No distress.  HENT:  Nose: Nose normal.  Mouth/Throat: Oropharynx is clear and moist.  Eyes: Right eye exhibits no discharge. Left eye exhibits no discharge. No scleral icterus.  Neck: Neck supple. No thyromegaly present.  Cardiovascular: Normal rate and regular rhythm.  Pulmonary/Chest: Breath sounds normal. No accessory muscle usage. No tachypnea. No respiratory distress. She has no decreased breath sounds. She has no wheezes. She has no rhonchi. Right breast exhibits no inverted nipple, no mass, no nipple discharge and no tenderness (no axillary adenopathy). Left breast exhibits no inverted nipple, no mass, no nipple discharge and no tenderness (no axilarry adenopathy).  Abdominal: Soft. Bowel sounds are normal. There is no tenderness.  Musculoskeletal: She exhibits no edema or tenderness.  Lymphadenopathy:    She has no cervical adenopathy.  Neurological: She is alert and oriented to person, place, and time.  Skin: Skin is warm. No rash noted. No erythema.  Psychiatric: She has a normal mood and affect. Her behavior is normal.    BP (!) 150/80 (BP Location: Left Arm, Patient Position: Sitting, Cuff Size: Normal)   Pulse 65   Temp 97.6 F (36.4 C) (Oral)   Resp 14   Ht 5' 4.17" (1.63 m)   Wt 156 lb 9.6 oz (71 kg)   SpO2 97%   BMI 26.74 kg/m  Wt Readings from Last 3 Encounters:  09/02/17 156 lb 9.6 oz (71 kg)  06/11/17 154 lb 8 oz (70.1 kg)  05/29/17 154 lb 3.2 oz (69.9 kg)     Lab Results  Component Value Date    WBC 5.4 03/03/2017   HGB 12.7 03/03/2017   HCT 37.5 03/03/2017   PLT 258.0 03/03/2017   GLUCOSE 95 07/02/2017   CHOL 198 07/02/2017   TRIG 159.0 (H) 07/02/2017   HDL 62.70 07/02/2017   LDLDIRECT 151.7 08/16/2013   LDLCALC 103 (H) 07/02/2017   ALT 24 07/02/2017   AST 15 07/02/2017   NA 140 07/02/2017   K 4.1 07/02/2017   CL 103 07/02/2017   CREATININE 0.75 07/02/2017   BUN 16 07/02/2017   CO2 28 07/02/2017   TSH 4.360 03/03/2017    Dg Inject Diag/thera/inc Needle/cath/plc Epi/lumb/sac W/img  Result Date: 06/25/2017 CLINICAL DATA:  Lumbosacral spondylosis without myelopathy. Bilateral low back pain. No radicular complaints. L5-S1 disc degeneration. FLUOROSCOPY TIME:  Radiation Exposure Index (as provided by the fluoroscopic device): 12.85 microGray*m^2 Fluoroscopy Time (  in minutes and seconds):  8 seconds PROCEDURE: The procedure, risks, benefits, and alternatives were explained to the patient. Questions regarding the procedure were encouraged and answered. The patient understands and consents to the procedure. LUMBAR EPIDURAL INJECTION: An interlaminar approach was performed on the left at L5-S1. The overlying skin was cleansed and anesthetized. A 3.5 inch 20 gauge epidural needle was advanced using loss-of-resistance technique. DIAGNOSTIC EPIDURAL INJECTION: Injection of Isovue-M 200 initially demonstrated mixed spread with a significant amount of contrast extending posterior to the ligament. The needle was advanced slightly and the bevel adjusted, and additional contrast injection showed a good epidural pattern with spread above and below the level of needle placement, primarily on the left. No vascular opacification is seen. THERAPEUTIC EPIDURAL INJECTION: 120 mg of Depo-Medrol mixed with 3 mL of 1% lidocaine were instilled. The procedure was well-tolerated, and the patient was discharged thirty minutes following the injection in good condition. COMPLICATIONS: None IMPRESSION: Technically  successful lumbar interlaminar epidural injection on the left at L5-S1. Electronically Signed   By: Logan Bores M.D.   On: 06/25/2017 09:28       Assessment & Plan:   Problem List Items Addressed This Visit    Back pain    S/p injection.  Helped.  Not using tramadol.  Follow.        Carotid stenosis    Followed by [facility].       Dyspnea on exertion    Just had cardiology w/up as outlined.  Recommended if symptoms persistent, to pursue pulmonary w/up.  She declines at this time.  Will stop statin medication.  Follow closely.        Health care maintenance    Physical today 09/02/17.  Overdue mammogram.  Schedule f/u mammogram.  States had colonoscopy.  Need results.  Have asked nurse to obtain.        Hypercholesteremia    On low dose pravastatin.  Question if this is contributing to some of her symptoms.  Wants to try a trial off the medication and see if symptoms improve.  Will call with update over the next 2-3 weeks.  Follow lipid panel and liver function tests.  Low cholesterol diet and exercise.  Had intolerance to crestor.        Relevant Orders   Hepatic function panel   Lipid panel   Hypertension    Blood pressure elevated on exam.  She reports is under better control at home.  Will spot check her pressure and send in readings.  Adjust medication if needed.  Follow metabolic panel.        Relevant Orders   Basic metabolic panel   Menopausal symptoms    Was on estrogen patch.  Tapered off. She is unable to tolerate being off.  Does not feel good. Not sleeping as well. Desires to restart.  Discussed side effects and risk of estrogen therapy.  She desires to restart.  Discussed if restart, would recommend extending out the patch and continuing to taper slowly.  She is in agreement.        Palpitations    Improved on carvedilol.  Continue f/u with cardiology.         Other Visit Diagnoses    Screening for breast cancer    -  Primary   Relevant Orders   MM DIGITAL  SCREENING BILATERAL       Einar Pheasant, MD

## 2017-09-03 ENCOUNTER — Encounter: Payer: Self-pay | Admitting: Internal Medicine

## 2017-09-03 MED ORDER — ESTRADIOL 0.1 MG/24HR TD PTWK: 0.1000 mg | MEDICATED_PATCH | TRANSDERMAL | 6 refills | Status: DC

## 2017-09-03 NOTE — Assessment & Plan Note (Signed)
Physical today 09/02/17.  Overdue mammogram.  Schedule f/u mammogram.  States had colonoscopy.  Need results.  Have asked nurse to obtain.

## 2017-09-03 NOTE — Assessment & Plan Note (Signed)
Blood pressure elevated on exam.  She reports is under better control at home.  Will spot check her pressure and send in readings.  Adjust medication if needed.  Follow metabolic panel.

## 2017-09-03 NOTE — Assessment & Plan Note (Signed)
Was on estrogen patch.  Tapered off. She is unable to tolerate being off.  Does not feel good. Not sleeping as well. Desires to restart.  Discussed side effects and risk of estrogen therapy.  She desires to restart.  Discussed if restart, would recommend extending out the patch and continuing to taper slowly.  She is in agreement.

## 2017-09-03 NOTE — Assessment & Plan Note (Signed)
S/p injection.  Helped.  Not using tramadol.  Follow.

## 2017-09-03 NOTE — Assessment & Plan Note (Signed)
Just had cardiology w/up as outlined.  Recommended if symptoms persistent, to pursue pulmonary w/up.  She declines at this time.  Will stop statin medication.  Follow closely.

## 2017-09-03 NOTE — Assessment & Plan Note (Signed)
Followed by [facility].   

## 2017-09-03 NOTE — Assessment & Plan Note (Addendum)
On low dose pravastatin.  Question if this is contributing to some of her symptoms.  Wants to try a trial off the medication and see if symptoms improve.  Will call with update over the next 2-3 weeks.  Follow lipid panel and liver function tests.  Low cholesterol diet and exercise.  Had intolerance to crestor.

## 2017-09-03 NOTE — Assessment & Plan Note (Signed)
Improved on carvedilol.  Continue f/u with cardiology.

## 2017-09-15 ENCOUNTER — Other Ambulatory Visit: Payer: Self-pay | Admitting: Internal Medicine

## 2017-09-15 NOTE — Telephone Encounter (Signed)
Attempted to call pt to notify pt to call pharmacy for refills. Unable to leave message.

## 2017-09-15 NOTE — Telephone Encounter (Signed)
Copied from [place]. Topic: Quick Communication - See Telephone Encounter >> Sep 15, 2017  4:33 PM Corie Chiquito, [place] wrote: CRM for notification. See Telephone encounter for: Patient needs a refill on her Losartan and her Estradiol 09/15/17.

## 2017-09-23 ENCOUNTER — Inpatient Hospital Stay: Admission: RE | Admit: 2017-09-23 | Payer: Medicare Other | Source: Ambulatory Visit

## 2017-09-25 ENCOUNTER — Ambulatory Visit
Admission: RE | Admit: 2017-09-25 | Discharge: 2017-09-25 | Disposition: A | Discharge status: Home / Self Care | Payer: Medicare Other | Source: Ambulatory Visit | Attending: Internal Medicine | Admitting: Internal Medicine

## 2017-09-25 DIAGNOSIS — Z1231 Encounter for screening mammogram for malignant neoplasm of breast: Secondary | ICD-10-CM | POA: Diagnosis not present

## 2017-09-25 DIAGNOSIS — Z1239 Encounter for other screening for malignant neoplasm of breast: Secondary | ICD-10-CM

## 2017-10-16 ENCOUNTER — Other Ambulatory Visit: Payer: Self-pay | Admitting: Internal Medicine

## 2017-11-21 ENCOUNTER — Other Ambulatory Visit (INDEPENDENT_AMBULATORY_CARE_PROVIDER_SITE_OTHER): Payer: Medicare Other

## 2017-11-21 DIAGNOSIS — I10 Essential (primary) hypertension: Secondary | ICD-10-CM | POA: Diagnosis not present

## 2017-11-21 DIAGNOSIS — E7800 Pure hypercholesterolemia, unspecified: Secondary | ICD-10-CM

## 2017-11-21 LAB — LIPID PANEL
Cholesterol: 195 mg/dL (ref 0–200)
HDL: 59.4 mg/dL (ref >39.00)
LDL Cholesterol: 115 mg/dL — ABNORMAL HIGH (ref 0–99)
NonHDL: 136.07
Total CHOL/HDL Ratio: 3
Triglycerides: 106 mg/dL (ref 0.0–149.0)
VLDL: 21.2 mg/dL (ref 0.0–40.0)

## 2017-11-21 LAB — HEPATIC FUNCTION PANEL
ALT: 20 U/L (ref 0–35)
AST: 16 U/L (ref 0–37)
Albumin: 4 g/dL (ref 3.5–5.2)
Alkaline Phosphatase: 60 U/L (ref 39–117)
Bilirubin, Direct: 0.1 mg/dL (ref 0.0–0.3)
Total Bilirubin: 0.8 mg/dL (ref 0.2–1.2)
Total Protein: 6.9 g/dL (ref 6.0–8.3)

## 2017-11-21 LAB — BASIC METABOLIC PANEL
BUN: 17 mg/dL (ref 6–23)
CO2: 29 mEq/L (ref 19–32)
Calcium: 9.1 mg/dL (ref 8.4–10.5)
Chloride: 102 mEq/L (ref 96–112)
Creatinine, Ser: 0.72 mg/dL (ref 0.40–1.20)
GFR: 84.97 mL/min (ref >60.00)
Glucose, Bld: 98 mg/dL (ref 70–99)
Potassium: 4 mEq/L (ref 3.5–5.1)
Sodium: 139 mEq/L (ref 135–145)

## 2017-11-26 ENCOUNTER — Ambulatory Visit (INDEPENDENT_AMBULATORY_CARE_PROVIDER_SITE_OTHER): Payer: Medicare Other | Admitting: Internal Medicine

## 2017-11-26 ENCOUNTER — Encounter: Payer: Self-pay | Admitting: Internal Medicine

## 2017-11-26 DIAGNOSIS — F439 Reaction to severe stress, unspecified: Secondary | ICD-10-CM

## 2017-11-26 DIAGNOSIS — E7800 Pure hypercholesterolemia, unspecified: Secondary | ICD-10-CM | POA: Diagnosis not present

## 2017-11-26 DIAGNOSIS — I10 Essential (primary) hypertension: Secondary | ICD-10-CM | POA: Diagnosis not present

## 2017-11-26 DIAGNOSIS — M545 Low back pain, unspecified: Secondary | ICD-10-CM

## 2017-11-26 DIAGNOSIS — R5383 Other fatigue: Secondary | ICD-10-CM

## 2017-11-26 DIAGNOSIS — M7989 Other specified soft tissue disorders: Secondary | ICD-10-CM

## 2017-11-26 DIAGNOSIS — I6523 Occlusion and stenosis of bilateral carotid arteries: Secondary | ICD-10-CM | POA: Diagnosis not present

## 2017-11-26 MED ORDER — TRAMADOL HCL 50 MG PO TABS: 50.0000 mg | ORAL_TABLET | Freq: Two times a day (BID) | ORAL | 0 refills | Status: DC | PRN

## 2017-11-26 MED ORDER — CEPHALEXIN 500 MG PO CAPS: 500.0000 mg | ORAL_CAPSULE | Freq: Three times a day (TID) | ORAL | 0 refills | Status: DC

## 2017-11-26 NOTE — Progress Notes (Signed)
Patient ID: Kristen Strickland, female   DOB: 1946-05-11, 72 y.o.   MRN: [id number]   Subjective:    Patient ID: Kristen Strickland, female    DOB: 1945/12/18, 72 y.o.   MRN: [id number]  HPI  Patient here for a scheduled follow up.  Is s/p lumbar injection.  Back has been doing better.  Starting to have some increased pain now.  Request refill on tramadol.  Was off for a while.  Now taking one per day. This helps her back and also helps her other joints.  She report some discomfort in her left knee.  States tramadol helps.  She was working outside yesterday and stuck her hand with branch of a plant.  Increased aching.  States felt like a toothache pain last night.  Some swelling. Appears to have minimal bruising. Swelling occurred overnight.  Some redness.  Increased pain localized over the top of the hand.  No redness or pain extending into the wrist or arm.  Increased stress.  She just lost her brother.  Crying intermittently through the exam.  Discussed grief counseling, etc.  She does not feel she needs anything more at this time.  Still reports some fatigue. Recently saw cardiology.  Stress test with inf/lateral scar vs infarct.  Carvedilol was added.  Cardiology felt no further cardiac w/up warranted.  Discussed with her today.  She desires no further testing.  She is eating.  No acid reflux.  No abdominal pain.  Bowels moving.     Past Medical History:  Diagnosis Date  . Arthritis   . Hypercholesterolemia   . Hypertension    Past Surgical History:  Procedure Laterality Date  . ABDOMINAL HYSTERECTOMY  1975  . ANTERIOR CERVICAL DECOMP/DISCECTOMY FUSION N/A 04/09/2013   Procedure: ANTERIOR CERVICAL DECOMPRESSION/DISCECTOMY FUSION 2 LEVELS;  Surgeon: Floyce Stakes, MD;  Location: [facility];  Service: Neurosurgery;  Laterality: N/A;  Cervical five-six Cervical six-seven  Anterior cervical decompression/diskectomy/fusion  . BACK SURGERY     ruptured disc  . RECONSTRUCTION OF NOSE     Family  History  Problem Relation Age of Onset  . Cancer Mother        ovarian/uterine  . Heart disease Mother   . Colon cancer Father   . Breast cancer Neg Hx    Social History   Socioeconomic History  . Marital status: Married    Spouse name: None  . Number of children: 2  . Years of education: None  . Highest education level: None  Social Needs  . Financial resource strain: None  . Food insecurity - worry: None  . Food insecurity - inability: None  . Transportation needs - medical: None  . Transportation needs - non-medical: None  Occupational History  . None  Tobacco Use  . Smoking status: Never Smoker  . Smokeless tobacco: Never Used  Substance and Sexual Activity  . Alcohol use: Yes    Alcohol/week: 0.0 oz    Comment: rarely  . Drug use: No  . Sexual activity: Yes  Other Topics Concern  . None  Social History Narrative  . None    Outpatient Encounter Medications as of 11/26/2017  Medication Sig  . aspirin EC 81 MG tablet Take 81 mg by mouth daily.  . Cyanocobalamin (VITAMIN B 12) 100 MCG LOZG Take by mouth daily.  Marland Kitchen estradiol (CLIMARA - DOSED IN MG/24 HR) 0.1 mg/24hr patch Place 1 patch (0.1 mg total) onto the skin once a week. APPLY 1 PATCH  ON SKIN ONCE WEEKLY.  Marland Kitchen losartan-hydrochlorothiazide (HYZAAR) 100-25 MG tablet Take 1 tablet by mouth daily.  . Multiple Vitamins-Minerals (CENTRUM PO) Take 1 tablet by mouth daily.   Marland Kitchen omeprazole (PRILOSEC) 20 MG capsule TAKE (1) CAPSULE BY MOUTH TWICE DAILY FOR REFLUX  . pravastatin (PRAVACHOL) 10 MG tablet TAKE 1 TABLET BY MOUTH EVERY MONDAY, WEDNESDAY, AND FRIDAY  . traMADol (ULTRAM) 50 MG tablet Take 1 tablet (50 mg total) by mouth 2 (two) times daily as needed.  . [DISCONTINUED] traMADol (ULTRAM) 50 MG tablet Take 1 tablet (50 mg total) by mouth 2 (two) times daily as needed.  . cephALEXin (KEFLEX) 500 MG capsule Take 1 capsule (500 mg total) by mouth 3 (three) times daily.  . [DISCONTINUED] estradiol (CLIMARA) 0.1 mg/24hr  patch Place 1 patch (0.1 mg total) once a week onto the skin.   No facility-administered encounter medications on file as of 11/26/2017.     Review of Systems  Constitutional: Positive for fatigue. Negative for appetite change and unexpected weight change.  HENT: Negative for congestion and sinus pressure.   Respiratory: Negative for cough, chest tightness and shortness of breath.   Cardiovascular: Negative for chest pain, palpitations and leg swelling.  Gastrointestinal: Negative for abdominal pain, diarrhea, nausea and vomiting.  Genitourinary: Negative for difficulty urinating and dysuria.  Musculoskeletal: Negative for myalgias.       Hand and joint pains as outlined.    Skin:       Minimal soft tissue swelling right hand.  Some minimal tenderness and redness as outlined.   Neurological: Negative for dizziness, light-headedness and headaches.  Psychiatric/Behavioral: Negative for agitation and confusion.       Increased stress as outlined.        Objective:    Physical Exam  Constitutional: She appears well-developed and well-nourished. No distress.  HENT:  Nose: Nose normal.  Mouth/Throat: Oropharynx is clear and moist.  Eyes: Conjunctivae are normal. Right eye exhibits no discharge. Left eye exhibits no discharge.  Neck: Neck supple. No thyromegaly present.  Cardiovascular: Normal rate and regular rhythm.  Pulmonary/Chest: Breath sounds normal. No respiratory distress. She has no wheezes.  Abdominal: Soft. Bowel sounds are normal. There is no tenderness.  Musculoskeletal: She exhibits no edema or tenderness.  Lymphadenopathy:    She has no cervical adenopathy.  Skin:  Minimal soft tissue swelling right hand.  Minimal tenderness and erythema - top of hand.  No erythema extending to the wrist or arm.  Able to make fist without difficulty.    Psychiatric: She has a normal mood and affect. Her behavior is normal.    BP 120/62 (BP Location: Left Arm, Patient Position:  Sitting, Cuff Size: Normal)   Pulse 63   Temp 97.7 F (36.5 C) (Oral)   Resp 18   Wt 150 lb 12.8 oz (68.4 kg)   SpO2 96%   BMI 25.75 kg/m  Wt Readings from Last 3 Encounters:  11/26/17 150 lb 12.8 oz (68.4 kg)  09/02/17 156 lb 9.6 oz (71 kg)  06/11/17 154 lb 8 oz (70.1 kg)     Lab Results  Component Value Date   WBC 5.4 03/03/2017   HGB 12.7 03/03/2017   HCT 37.5 03/03/2017   PLT 258.0 03/03/2017   GLUCOSE 98 11/21/2017   CHOL 195 11/21/2017   TRIG 106.0 11/21/2017   HDL 59.40 11/21/2017   LDLDIRECT 151.7 08/16/2013   LDLCALC 115 (H) 11/21/2017   ALT 20 11/21/2017   AST 16 11/21/2017  NA 139 11/21/2017   K 4.0 11/21/2017   CL 102 11/21/2017   CREATININE 0.72 11/21/2017   BUN 17 11/21/2017   CO2 29 11/21/2017   TSH 4.360 03/03/2017    Mm Digital Screening Bilateral  Result Date: 09/25/2017 CLINICAL DATA:  Screening. EXAM: DIGITAL SCREENING BILATERAL MAMMOGRAM WITH CAD COMPARISON:  Previous exam(s). ACR Breast Density Category c: The breast tissue is heterogeneously dense, which may obscure small masses. FINDINGS: There are no findings suspicious for malignancy. Images were processed with CAD. IMPRESSION: No mammographic evidence of malignancy. A result letter of this screening mammogram will be mailed directly to the patient. RECOMMENDATION: Screening mammogram in one year. (Code:SM-B-01Y) BI-RADS CATEGORY  1: Negative. Electronically Signed   By: Curlene Dolphin M.D.   On: 09/25/2017 15:29       Assessment & Plan:   Problem List Items Addressed This Visit    Back pain    S/p injection.  Was better. Some increased pain now.  Request refill tramadol. Taking one per day.  Helps.        Relevant Medications   traMADol (ULTRAM) 50 MG tablet   Carotid stenosis    Followed by [facility].        Fatigue    Reports persistent fatigue as outlined.  Saw cardiology. W/up as outlined. Felt no further cardiac w/up warranted.  Probably is multifactorial.  Increased stress with  recent deaths in her family.  Discussed further testing and evaluation.  She declines.  Will follow.        Relevant Orders   CBC with Differential/Platelet   TSH   Hypercholesteremia    On low dose pravastatin.  Discussed lab results.  Discussed increasing the pravastatin.  She declines.  Wants to continue current dose.        Relevant Orders   Hepatic function panel   Lipid panel   Hypertension    Blood pressure as outlined.  Same medication regimen.  Follow pressures.  Follow metabolic panel.       Relevant Orders   Basic metabolic panel   Stress    Increased stress.  Discussed with her today.  Discussed counseling.  She declines.  Does not feel needs anything more at this time.  Follow.       Swelling of right hand    Appears to have a small puncture wound.  Minimal redness and soft tissue swelling.  Place on keflex.  Elevate.  Follow closely.  Notify me if any worsening problems, etc.            Einar Pheasant, MD

## 2017-11-29 ENCOUNTER — Encounter: Payer: Self-pay | Admitting: Internal Medicine

## 2017-11-29 DIAGNOSIS — M7989 Other specified soft tissue disorders: Secondary | ICD-10-CM | POA: Insufficient documentation

## 2017-11-29 DIAGNOSIS — R5383 Other fatigue: Secondary | ICD-10-CM | POA: Insufficient documentation

## 2017-11-29 NOTE — Assessment & Plan Note (Signed)
Blood pressure as outlined.  Same medication regimen.  Follow pressures.  Follow metabolic panel.  

## 2017-11-29 NOTE — Assessment & Plan Note (Signed)
Followed by [facility].   

## 2017-11-29 NOTE — Assessment & Plan Note (Signed)
Reports persistent fatigue as outlined.  Saw cardiology. W/up as outlined. Felt no further cardiac w/up warranted.  Probably is multifactorial.  Increased stress with recent deaths in her family.  Discussed further testing and evaluation.  She declines.  Will follow.

## 2017-11-29 NOTE — Assessment & Plan Note (Signed)
S/p injection.  Was better. Some increased pain now.  Request refill tramadol. Taking one per day.  Helps.

## 2017-11-29 NOTE — Assessment & Plan Note (Signed)
Increased stress.  Discussed with her today.  Discussed counseling.  She declines.  Does not feel needs anything more at this time.  Follow.

## 2017-11-29 NOTE — Assessment & Plan Note (Signed)
On low dose pravastatin.  Discussed lab results.  Discussed increasing the pravastatin.  She declines.  Wants to continue current dose.

## 2017-11-29 NOTE — Assessment & Plan Note (Signed)
Appears to have a small puncture wound.  Minimal redness and soft tissue swelling.  Place on keflex.  Elevate.  Follow closely.  Notify me if any worsening problems, etc.

## 2017-12-08 ENCOUNTER — Encounter (INDEPENDENT_AMBULATORY_CARE_PROVIDER_SITE_OTHER): Payer: Self-pay | Admitting: Vascular Surgery

## 2017-12-08 ENCOUNTER — Ambulatory Visit (INDEPENDENT_AMBULATORY_CARE_PROVIDER_SITE_OTHER): Payer: Medicare Other | Admitting: Vascular Surgery

## 2017-12-08 ENCOUNTER — Other Ambulatory Visit (INDEPENDENT_AMBULATORY_CARE_PROVIDER_SITE_OTHER): Payer: Self-pay | Admitting: Vascular Surgery

## 2017-12-08 ENCOUNTER — Other Ambulatory Visit (INDEPENDENT_AMBULATORY_CARE_PROVIDER_SITE_OTHER): Payer: Medicare Other

## 2017-12-08 VITALS — BP 114/44 | HR 53 | Resp 16 | Wt 150.0 lb

## 2017-12-08 DIAGNOSIS — E7800 Pure hypercholesterolemia, unspecified: Secondary | ICD-10-CM | POA: Diagnosis not present

## 2017-12-08 DIAGNOSIS — M1991 Primary osteoarthritis, unspecified site: Secondary | ICD-10-CM | POA: Diagnosis not present

## 2017-12-08 DIAGNOSIS — I6523 Occlusion and stenosis of bilateral carotid arteries: Secondary | ICD-10-CM | POA: Diagnosis not present

## 2017-12-08 DIAGNOSIS — I10 Essential (primary) hypertension: Secondary | ICD-10-CM

## 2017-12-08 NOTE — Progress Notes (Signed)
MRN : [id number]  Kristen Strickland is a 71 y.o. (Nov 10, 1945) female who presents with chief complaint of  Chief Complaint  Patient presents with  . Follow-up    70mo Carotid ultrasound  .  History of Present Illness: The patient is seen for follow up evaluation of carotid stenosis. The carotid stenosis followed by ultrasound.   The patient denies amaurosis fugax. There is no recent history of TIA symptoms or focal motor deficits. There is no prior documented CVA.  The patient is taking enteric-coated aspirin 81 mg daily.  There is no history of migraine headaches. There is no history of seizures.  The patient has a history of coronary artery disease, no recent episodes of angina or shortness of breath. The patient denies PAD or claudication symptoms. There is a history of hyperlipidemia which is being treated with a statin.     Current Meds  Medication Sig  . aspirin EC 81 MG tablet Take 81 mg by mouth daily.  . Cyanocobalamin (VITAMIN B 12) 100 MCG LOZG Take by mouth daily.  Marland Kitchen estradiol (CLIMARA - DOSED IN MG/24 HR) 0.1 mg/24hr patch Place 1 patch (0.1 mg total) onto the skin once a week. APPLY 1 PATCH ON SKIN ONCE WEEKLY.  Marland Kitchen losartan-hydrochlorothiazide (HYZAAR) 100-25 MG tablet Take 1 tablet by mouth daily.  . Multiple Vitamins-Minerals (CENTRUM PO) Take 1 tablet by mouth daily.   Marland Kitchen omeprazole (PRILOSEC) 20 MG capsule TAKE (1) CAPSULE BY MOUTH TWICE DAILY FOR REFLUX  . pravastatin (PRAVACHOL) 10 MG tablet TAKE 1 TABLET BY MOUTH EVERY MONDAY, WEDNESDAY, AND FRIDAY  . traMADol (ULTRAM) 50 MG tablet Take 1 tablet (50 mg total) by mouth 2 (two) times daily as needed.    Past Medical History:  Diagnosis Date  . Arthritis   . Hypercholesterolemia   . Hypertension     Past Surgical History:  Procedure Laterality Date  . ABDOMINAL HYSTERECTOMY  1975  . ANTERIOR CERVICAL DECOMP/DISCECTOMY FUSION N/A 04/09/2013   Procedure: ANTERIOR CERVICAL DECOMPRESSION/DISCECTOMY FUSION 2  LEVELS;  Surgeon: Floyce Stakes, MD;  Location: [facility];  Service: Neurosurgery;  Laterality: N/A;  Cervical five-six Cervical six-seven  Anterior cervical decompression/diskectomy/fusion  . BACK SURGERY     ruptured disc  . RECONSTRUCTION OF NOSE      Social History Social History   Tobacco Use  . Smoking status: Never Smoker  . Smokeless tobacco: Never Used  Substance Use Topics  . Alcohol use: Yes    Alcohol/week: 0.0 oz    Comment: rarely  . Drug use: No    Family History Family History  Problem Relation Age of Onset  . Cancer Mother        ovarian/uterine  . Heart disease Mother   . Colon cancer Father   . Breast cancer Neg Hx     No Known Allergies   REVIEW OF SYSTEMS (Negative unless checked)  Constitutional: [] Weight loss  [] Fever  [] Chills Cardiac: [] Chest pain   [] Chest pressure   [] Palpitations   [] Shortness of breath when laying flat   [] Shortness of breath with exertion. Vascular:  [] Pain in legs with walking   [] Pain in legs at rest  [] History of DVT   [] Phlebitis   [] Swelling in legs   [] Varicose veins   [] Non-healing ulcers Pulmonary:   [] Uses home oxygen   [] Productive cough   [] Hemoptysis   [] Wheeze  [] COPD   [] Asthma Neurologic:  [] Dizziness   [] Seizures   [] History of stroke   []   History of TIA  [] Aphasia   [] Vissual changes   [] Weakness or numbness in arm   [] Weakness or numbness in leg Musculoskeletal:   [] Joint swelling   [] Joint pain   [] Low back pain Hematologic:  [] Easy bruising  [] Easy bleeding   [] Hypercoagulable state   [] Anemic Gastrointestinal:  [] Diarrhea   [] Vomiting  [] Gastroesophageal reflux/heartburn   [] Difficulty swallowing. Genitourinary:  [] Chronic kidney disease   [] Difficult urination  [] Frequent urination   [] Blood in urine Skin:  [] Rashes   [] Ulcers  Psychological:  [] History of anxiety   []  History of major depression.  Physical Examination  Vitals:   12/08/17 1024 12/08/17 1025  BP: (!) 87/48 (!) 114/44  Pulse:  (!) 53   Resp: 16   Weight: 150 lb (68 kg)    Body mass index is 25.61 kg/m. Gen: WD/WN, NAD Head: [place]/AT, No temporalis wasting.  Ear/Nose/Throat: Hearing grossly intact, nares w/o erythema or drainage Eyes: PER, EOMI, sclera nonicteric.  Neck: Supple, no large masses.   Pulmonary:  Good air movement, no audible wheezing bilaterally, no use of accessory muscles.  Cardiac: RRR, no JVD Vascular:  Left carotid bruit Vessel Right Left  Radial Palpable Palpable  Ulnar Palpable Palpable  Brachial Palpable Palpable  Carotid Palpable Palpable  Gastrointestinal: Non-distended. No guarding/no peritoneal signs.  Musculoskeletal: M/S 5/5 throughout.  No deformity or atrophy.  Neurologic: CN 2-12 intact. Symmetrical.  Speech is fluent. Motor exam as listed above. Psychiatric: Judgment intact, Mood & affect appropriate for pt's clinical situation. Dermatologic: No rashes or ulcers noted.  No changes consistent with cellulitis. Lymph : No lichenification or skin changes of chronic lymphedema.  CBC Lab Results  Component Value Date   WBC 5.4 03/03/2017   HGB 12.7 03/03/2017   HCT 37.5 03/03/2017   MCV 89.0 03/03/2017   PLT 258.0 03/03/2017    BMET    Component Value Date/Time   NA 139 11/21/2017 0822   K 4.0 11/21/2017 0822   CL 102 11/21/2017 0822   CO2 29 11/21/2017 0822   GLUCOSE 98 11/21/2017 0822   BUN 17 11/21/2017 0822   CREATININE 0.72 11/21/2017 0822   CALCIUM 9.1 11/21/2017 0822   GFRNONAA >90 04/09/2013 0851   GFRAA >90 04/09/2013 0851   Estimated Creatinine Clearance: 62.3 mL/min (by C-G formula based on SCr of 0.72 mg/dL).  COAG No results found for: INR, PROTIME  Radiology No results found.   Assessment/Plan 1. Bilateral carotid artery stenosis Recommend:  Given the patient's asymptomatic subcritical stenosis no further invasive testing or surgery at this time.  Duplex ultrasound shows <40% RICA and 84-13% LICA stenosis bilaterally.  Continue  antiplatelet therapy as prescribed Continue management of CAD, HTN and Hyperlipidemia Healthy heart diet,  encouraged exercise at least 4 times per week Follow up in 12 months with duplex ultrasound and physical exam based   2. Essential hypertension Continue antihypertensive medications as already ordered, these medications have been reviewed and there are no changes at this time.   3. Hypercholesteremia Continue statin as ordered and reviewed, no changes at this time   4. Primary osteoarthritis, unspecified site Continue NSAID medications as already ordered, these medications have been reviewed and there are no changes at this time.  Continued activity and therapy was stressed.    Hortencia Pilar, MD  12/08/2017 12:42 PM

## 2017-12-13 ENCOUNTER — Other Ambulatory Visit: Payer: Self-pay | Admitting: Internal Medicine

## 2017-12-15 ENCOUNTER — Other Ambulatory Visit: Payer: Self-pay | Admitting: Internal Medicine

## 2017-12-17 NOTE — Progress Notes (Signed)
Cardiology Office Note Date:  12/18/2017  Patient ID:  Kristen Strickland 04-18-1946, MRN [id number] PCP:  Einar Pheasant, MD  Cardiologist:  Dr. Saunders Revel, MD    Chief Complaint: Follow up  History of Present Illness: Kristen Strickland is a 72 y.o. female with history of carotid artery stenosis, HTN, HLD, and degenerative disk disease with chronic leg pain who presents for follow up of palpitations and SOB.   She was previously followed by Dr. Yvone Neu, though more recently she has been followed by Dr, End. Exercise Myoview on 02/24/2017 was low risk without ischemia. Small in size, mild in severity, fixed basal inferolateral defect that may reflect artifact or subtle scar, LVEF > 65%. TTE 03/06/2017 showed an EF of 60-65%, normal wall motion, normal diastolic function, mild mitral regurgitation, normal RV size and systolic function, PASP normal. Holter monitor in 02/2017 showed predominant rhythm of sinus with an average heart rate of 66 bpm (range 51-115 bpm), longest R-R interval of 1.3 seconds, rare PACs including atrial couplets and episodes of bigeminy, four atrial runs lasting up to 5 betas with a maximum rate of 157 bpm, rare isolated PVCs, no sustained arrhythmias or prolonged pauses were noted. She was seen in 02/2017 for palpitations and was placed on Coreg with resoluation of palpitations. She was most recently seen on 06/11/2017 and continued to note exertional dyspnea. BP 140/80 at that time.   She comes in doing well today.  She does continue to note intermittent exertional dyspnea with modest activity.  She reports she typically goes up and down her basement stairs "20 times a day."  She notes she will sometimes be short of breath after climbing the stairs.  She also reports having an incline out to her mailbox and at times is short of breath walking out to her mailbox though this does not happen every time.  Never with any chest pain, lightheadedness, dizziness, presyncope, or syncope.  Weight has  been stable.  Blood pressure typically running in the 846K-599J systolic at home.  Heart rate has ranged from the low to mid 50s to low 60s BPM at prior office visits since starting carvedilol.  She continues to note significant improvement and palpitations following initiation of carvedilol.  She denies any orthopnea, PND, early satiety, or lower extremity swelling.  She continues to deal with chronic back pain and intermittent leg discomfort that is only noted upon waking up in the morning though not every morning.  She has been out of amlodipine 2.5 mg for the past several days.  She otherwise has been compliant with medications.   Past Medical History:  Diagnosis Date  . Arthritis   . Hypercholesterolemia   . Hypertension     Past Surgical History:  Procedure Laterality Date  . ABDOMINAL HYSTERECTOMY  1975  . ANTERIOR CERVICAL DECOMP/DISCECTOMY FUSION N/A 04/09/2013   Procedure: ANTERIOR CERVICAL DECOMPRESSION/DISCECTOMY FUSION 2 LEVELS;  Surgeon: Floyce Stakes, MD;  Location: [facility];  Service: Neurosurgery;  Laterality: N/A;  Cervical five-six Cervical six-seven  Anterior cervical decompression/diskectomy/fusion  . BACK SURGERY     ruptured disc  . RECONSTRUCTION OF NOSE      Current Meds  Medication Sig  . aspirin EC 81 MG tablet Take 81 mg by mouth daily.  . carvedilol (COREG) 3.125 MG tablet Take 3.125 mg by mouth 2 (two) times daily.  . Cyanocobalamin (VITAMIN B 12) 100 MCG LOZG Take by mouth daily.  Marland Kitchen estradiol (CLIMARA - DOSED IN MG/24 HR)  0.1 mg/24hr patch Place 1 patch (0.1 mg total) onto the skin once a week. APPLY 1 PATCH ON SKIN ONCE WEEKLY.  Marland Kitchen losartan-hydrochlorothiazide (HYZAAR) 100-25 MG tablet TAKE 1 TABLET BY MOUTH ONCE DAILY.  . Multiple Vitamins-Minerals (CENTRUM PO) Take 1 tablet by mouth daily.   Marland Kitchen omeprazole (PRILOSEC) 20 MG capsule TAKE (1) CAPSULE BY MOUTH TWICE DAILY FOR REFLUX  . pravastatin (PRAVACHOL) 10 MG tablet TAKE 1 TABLET BY MOUTH EVERY  MONDAY, WEDNESDAY, AND FRIDAY  . traMADol (ULTRAM) 50 MG tablet Take 1 tablet (50 mg total) by mouth 2 (two) times daily as needed.    Allergies:   Patient has no known allergies.   Social History:  The patient  reports that  has never smoked. she has never used smokeless tobacco. She reports that she drinks alcohol. She reports that she does not use drugs.   Family History:  The patient's family history includes Cancer in her mother; Colon cancer in her father; Heart disease in her mother.  ROS:   Review of Systems  Constitutional: Positive for malaise/fatigue. Negative for chills, diaphoresis, fever and weight loss.  HENT: Negative for congestion.   Eyes: Negative for discharge and redness.  Respiratory: Positive for shortness of breath. Negative for cough, hemoptysis, sputum production and wheezing.   Cardiovascular: Negative for chest pain, palpitations, orthopnea, claudication, leg swelling and PND.  Gastrointestinal: Negative for abdominal pain, blood in stool, heartburn, melena, nausea and vomiting.  Genitourinary: Negative for hematuria.  Musculoskeletal: Positive for back pain, joint pain and myalgias. Negative for falls.  Skin: Negative for rash.  Neurological: Negative for dizziness, tingling, tremors, sensory change, speech change, focal weakness, loss of consciousness and weakness.  Endo/Heme/Allergies: Does not bruise/bleed easily.  Psychiatric/Behavioral: Negative for substance abuse. The patient is not nervous/anxious.   All other systems reviewed and are negative.    PHYSICAL EXAM:  VS:  BP (!) 142/74 (BP Location: Left Arm, Patient Position: Sitting, Cuff Size: Normal)   Pulse (!) 51   Ht 5\' 3"  (1.6 m)   Wt 152 lb 8 oz (69.2 kg)   BMI 27.01 kg/m  BMI: Body mass index is 27.01 kg/m.  Physical Exam  Constitutional: She is oriented to person, place, and time. She appears well-developed and well-nourished.  HENT:  Head: Normocephalic and atraumatic.  Eyes: Right  eye exhibits no discharge. Left eye exhibits no discharge.  Neck: Normal range of motion. No JVD present.  Cardiovascular: Regular rhythm, S1 normal, S2 normal and normal heart sounds. Bradycardia present. Exam reveals no distant heart sounds, no friction rub, no midsystolic click and no opening snap.  No murmur heard. Pulses:      Dorsalis pedis pulses are 2+ on the right side, and 2+ on the left side.       Posterior tibial pulses are 2+ on the right side, and 2+ on the left side.  Pulmonary/Chest: Effort normal and breath sounds normal. No respiratory distress. She has no decreased breath sounds. She has no wheezes. She has no rales. She exhibits no tenderness.  Abdominal: Soft. She exhibits no distension. There is no tenderness.  Musculoskeletal: She exhibits no edema.  Neurological: She is alert and oriented to person, place, and time.  Skin: Skin is warm and dry. No cyanosis. Nails show no clubbing.  Psychiatric: She has a normal mood and affect. Her speech is normal and behavior is normal. Judgment and thought content normal.     EKG:  Was ordered and interpreted by me today.  Shows sinus bradycardia, 51 bpm, normal axis, no acute st/t changes  Recent Labs: 03/03/2017: Hemoglobin 12.7; Platelets 258.0; TSH 4.360 11/21/2017: ALT 20; BUN 17; Creatinine, Ser 0.72; Potassium 4.0; Sodium 139  11/21/2017: Cholesterol 195; HDL 59.40; LDL Cholesterol 115; Total CHOL/HDL Ratio 3; Triglycerides 106.0; VLDL 21.2   CrCl cannot be calculated (Patient's most recent lab result is older than the maximum 21 days allowed.).   Wt Readings from Last 3 Encounters:  12/18/17 152 lb 8 oz (69.2 kg)  12/08/17 150 lb (68 kg)  11/26/17 150 lb 12.8 oz (68.4 kg)     Other studies reviewed: Additional studies/records reviewed today include: summarized above  ASSESSMENT AND PLAN:  1. Exertional dyspnea: Symptoms continue to be stable.  Cardiac workup thus far has been mostly unrevealing and notable for only a  fixed basal inferior lateral defect on stress test that possibly represented subtle scar versus artifact.  Prior echocardiogram was normal.  Symptoms are intermittent.  We will refer her to pulmonology for PFT.  Should this workup be unrevealing and if her symptoms persist we may need to consider right and left cardiac catheterization.  2. Palpitations: Resolved following initiation of carvedilol.  However, given her bradycardic heart rate of 51 bpm in the office today we will have her do a trial of holding carvedilol.  Should she note return of her palpitations we may need to resume a low-dose carvedilol.  3. Hypertension: Blood pressure remains suboptimally controlled.  She has been out of her amlodipine for the past several days.  Given we are holding carvedilol as above we will restart her on amlodipine 5 mg once daily.  She will continue Hyzaar 100/25 mg daily.  Should she continue to note suboptimal blood pressure consider low-dose spironolactone and follow-up.  4. Leg discomfort: Symptoms appear atypical in presentation.  She has 2+ pedal pulses on exam.  Symptoms are not consistent with claudication.  Suspect this is related to her chronic back pain/arthritis.  Advised her to follow-up with PCP.  Disposition: F/u with me or Dr. Saunders Revel in 3 months.   Current medicines are reviewed at length with the patient today.  The patient did not have any concerns regarding medicines.  Signed, Christell Faith, PA-C 12/18/2017 11:21 AM     [facility] [address] [place] [address] [place], [place] [id number] [phone]

## 2017-12-18 ENCOUNTER — Encounter: Payer: Self-pay | Admitting: Physician Assistant

## 2017-12-18 ENCOUNTER — Ambulatory Visit (INDEPENDENT_AMBULATORY_CARE_PROVIDER_SITE_OTHER): Payer: Medicare Other | Admitting: Physician Assistant

## 2017-12-18 VITALS — BP 142/74 | HR 51 | Ht 63.0 in | Wt 152.5 lb

## 2017-12-18 DIAGNOSIS — I10 Essential (primary) hypertension: Secondary | ICD-10-CM | POA: Diagnosis not present

## 2017-12-18 DIAGNOSIS — R002 Palpitations: Secondary | ICD-10-CM

## 2017-12-18 DIAGNOSIS — M79605 Pain in left leg: Secondary | ICD-10-CM

## 2017-12-18 DIAGNOSIS — R0609 Other forms of dyspnea: Secondary | ICD-10-CM

## 2017-12-18 DIAGNOSIS — I6523 Occlusion and stenosis of bilateral carotid arteries: Secondary | ICD-10-CM

## 2017-12-18 DIAGNOSIS — M79604 Pain in right leg: Secondary | ICD-10-CM | POA: Diagnosis not present

## 2017-12-18 DIAGNOSIS — R0600 Dyspnea, unspecified: Secondary | ICD-10-CM

## 2017-12-18 MED ORDER — AMLODIPINE BESYLATE 5 MG PO TABS: 5.0000 mg | ORAL_TABLET | Freq: Every day | ORAL | 3 refills | Status: DC

## 2017-12-18 NOTE — Patient Instructions (Signed)
Medication Instructions:  Your physician has recommended you make the following change in your medication:  1- STOP Carvedilol. 2- START Amlodipine 5 mg (1 tablet) by mouth once a day.   Labwork: none  Testing/Procedures: none  Follow-Up: You have been referred to Pulmonology for evaluation of shortness of breath.  Your physician recommends that you schedule a follow-up appointment in: [place].   If you need a refill on your cardiac medications before your next appointment, please call your pharmacy.

## 2017-12-22 NOTE — Progress Notes (Signed)
[facility] Consultation      Assessment and Plan:  Dyspnea on exertion.  -Uncertain etiology, however the patient was ambulate briskly in the office today with minimal dyspnea, though she did complain of some lightheadedness which did not affect her gait. -I will obtain some testing to rule out pulmonary disease, including 6-minute walk, pulmonary function testing, chest x-ray. - Assuming that these show no evidence of respiratory disease I recommended to her that she may benefit from taking it a bit slower, and pacing herself when doing activities such as walking up the hill or walking upstairs, as she appears to walk very fast when doing activities.  Orders Placed This Encounter  Procedures  . DG Chest 2 View  . Pulmonary Function Test [facility] Only  . 6 minute walk   Return in about 4 weeks (around 01/21/2018).    Date: 12/24/2017  MRN# [id number] Kristen Strickland Sep 14, 1946    Kristen Strickland is a 72 y.o. old female seen in consultation for chief complaint of:    Chief Complaint  Patient presents with  . Consult    Referred by Christell Faith for eval of sob.  Marland Kitchen Shortness of Breath    sob with exertion, cardiac workup pending.  . Dizziness    pt states she feels lightheaded after walking up incline. She only has sob with exertion.    HPI:   Kristen Strickland is a 72 y.o. female with history of carotid artery stenosis, HTN, HLD, and degenerative disk disease with chronic leg pain.  She was referred from the cardiology office, she was seen there with palpitations and symptoms of dyspnea.  Thus far cardiac workup has been unrevealing, she was thus referred here for further workup. Her problems started about a year, insidious in onset, gradually progressive. She has dyspnea on exertion, she has to take her time going up her basement steps and walking up inclines without difficulty. Now she gets lightheaded (kind of). Then she will stop for a couple of minutes, then she will  be able to go ok.  She has never smoked, her parents smoked[place]  No recent lung imaging. She has never been diagnosed with respiratory symptoms. She denies sinus drainge, she takes medicine for reflux.   Desat walk at rest, on RA, sat 98% and HR 67. Patient walked at a very fast pace 540 feet. Sat was 97% and HR 89. She had minimal dyspnea, she felt lightheaded.   PMHX:   Past Medical History:  Diagnosis Date  . Arthritis   . Hypercholesterolemia   . Hypertension    Surgical Hx:  Past Surgical History:  Procedure Laterality Date  . ABDOMINAL HYSTERECTOMY  1975  . ANTERIOR CERVICAL DECOMP/DISCECTOMY FUSION N/A 04/09/2013   Procedure: ANTERIOR CERVICAL DECOMPRESSION/DISCECTOMY FUSION 2 LEVELS;  Surgeon: Floyce Stakes, MD;  Location: [facility];  Service: Neurosurgery;  Laterality: N/A;  Cervical five-six Cervical six-seven  Anterior cervical decompression/diskectomy/fusion  . BACK SURGERY     ruptured disc  . RECONSTRUCTION OF NOSE     Family Hx:  Family History  Problem Relation Age of Onset  . Cancer Mother        ovarian/uterine  . Heart disease Mother   . Colon cancer Father   . Breast cancer Neg Hx    Social Hx:   Social History   Tobacco Use  . Smoking status: Never Smoker  . Smokeless tobacco: Never Used  Substance Use  Topics  . Alcohol use: Yes    Alcohol/week: 0.0 oz    Comment: rarely  . Drug use: No   Medication:    Current Outpatient Medications:  .  amLODipine (NORVASC) 5 MG tablet, Take 1 tablet (5 mg total) by mouth daily., Disp: 90 tablet, Rfl: 3 .  aspirin EC 81 MG tablet, Take 81 mg by mouth daily., Disp: , Rfl:  .  Cyanocobalamin (VITAMIN B 12) 100 MCG LOZG, Take by mouth daily., Disp: , Rfl:  .  estradiol (CLIMARA - DOSED IN MG/24 HR) 0.1 mg/24hr patch, Place 1 patch (0.1 mg total) onto the skin once a week. APPLY 1 PATCH ON SKIN ONCE WEEKLY., Disp: 12 patch, Rfl: 0 .  losartan-hydrochlorothiazide (HYZAAR) 100-25 MG  tablet, TAKE 1 TABLET BY MOUTH ONCE DAILY., Disp: 30 tablet, Rfl: 0 .  Multiple Vitamins-Minerals (CENTRUM PO), Take 1 tablet by mouth daily. , Disp: , Rfl:  .  omeprazole (PRILOSEC) 20 MG capsule, TAKE (1) CAPSULE BY MOUTH TWICE DAILY FOR REFLUX, Disp: 180 capsule, Rfl: 0 .  pravastatin (PRAVACHOL) 10 MG tablet, TAKE 1 TABLET BY MOUTH EVERY MONDAY, WEDNESDAY, AND FRIDAY, Disp: 30 tablet, Rfl: 0 .  traMADol (ULTRAM) 50 MG tablet, Take 1 tablet (50 mg total) by mouth 2 (two) times daily as needed., Disp: 60 tablet, Rfl: 0   Allergies:  Patient has no known allergies.  Review of Systems: Gen:  Denies  fever, sweats, chills HEENT: Denies blurred vision, double vision. bleeds, sore throat Cvc:  No dizziness, chest pain. Resp:   Denies cough or sputum production, shortness of breath Gi: Denies swallowing difficulty, stomach pain. Gu:  Denies bladder incontinence, burning urine Ext:   No Joint pain, stiffness. Skin: No skin rash,  hives  Endoc:  No polyuria, polydipsia. Psych: No depression, insomnia. Other:  All other systems were reviewed with the patient and were negative other that what is mentioned in the HPI.   Physical Examination:   VS: BP (!) 172/88 (BP Location: Left Arm, Cuff Size: Normal)   Pulse 72   Resp 16   Ht 5\' 3"  (1.6 m)   Wt 151 lb (68.5 kg)   SpO2 99%   BMI 26.75 kg/m   General Appearance: No distress  Neuro:without focal findings,  speech normal,  HEENT: PERRLA, EOM intact.   Pulmonary: normal breath sounds, No wheezing.  CardiovascularNormal S1,S2.  No m/r/g.   Abdomen: Benign, Soft, non-tender. Renal:  No costovertebral tenderness  GU:  No performed at this time. Endoc: No evident thyromegaly, no signs of acromegaly. Skin:   warm, no rashes, no ecchymosis  Extremities: normal, no cyanosis, clubbing.  Other findings:    LABORATORY PANEL:   CBC No results for input(s): WBC, HGB, HCT, PLT in the last 168  hours. ------------------------------------------------------------------------------------------------------------------  Chemistries  No results for input(s): NA, K, CL, CO2, GLUCOSE, BUN, CREATININE, CALCIUM, MG, AST, ALT, ALKPHOS, BILITOT in the last 168 hours.  Invalid input(s): GFRCGP ------------------------------------------------------------------------------------------------------------------  Cardiac Enzymes No results for input(s): TROPONINI in the last 168 hours. ------------------------------------------------------------  RADIOLOGY:  No results found.     Thank  you for the consultation and for allowing [facility] to assist in the care of your patient. Our recommendations are noted above.  Please contact us if we can be of further service.   Marda Stalker, MD.  Board Certified in Internal Medicine, Pulmonary Medicine, [place], and Sleep Medicine.  [facility] Office Number: [phone]  [place], M.D.  Merton Border, M.D  12/24/2017

## 2017-12-24 ENCOUNTER — Telehealth: Payer: Self-pay | Admitting: Internal Medicine

## 2017-12-24 ENCOUNTER — Encounter: Payer: Self-pay | Admitting: Internal Medicine

## 2017-12-24 ENCOUNTER — Ambulatory Visit
Admission: RE | Admit: 2017-12-24 | Discharge: 2017-12-24 | Disposition: A | Discharge status: Home / Self Care | Payer: Medicare Other | Source: Ambulatory Visit | Attending: Internal Medicine | Admitting: Internal Medicine

## 2017-12-24 ENCOUNTER — Ambulatory Visit (INDEPENDENT_AMBULATORY_CARE_PROVIDER_SITE_OTHER): Payer: Medicare Other | Admitting: Internal Medicine

## 2017-12-24 VITALS — BP 172/88 | HR 72 | Resp 16 | Ht 63.0 in | Wt 151.0 lb

## 2017-12-24 DIAGNOSIS — I6523 Occlusion and stenosis of bilateral carotid arteries: Secondary | ICD-10-CM

## 2017-12-24 DIAGNOSIS — R0600 Dyspnea, unspecified: Secondary | ICD-10-CM

## 2017-12-24 DIAGNOSIS — R0609 Other forms of dyspnea: Secondary | ICD-10-CM | POA: Diagnosis not present

## 2017-12-24 MED ORDER — AMLODIPINE BESYLATE 2.5 MG PO TABS: 2.5000 mg | ORAL_TABLET | Freq: Every day | ORAL | 4 refills | Status: DC

## 2017-12-24 NOTE — Telephone Encounter (Signed)
S/w patient. She complains of lightheadedness upon standing or moving around quickly. She says it started once she start the amlodipine and stopping the carvedilol end of last week. Denies chest pain, shortness of breath, palpitations.  Saw pulmonology today and BP was 172/88, HR 72. When patient got home BP was 112/57 and 118/56.  Patient has not recorded any other blood pressure readings. Advised patient to continue to monitor BP and HR daily. Routing to [employer], [place] for review.

## 2017-12-24 NOTE — Patient Instructions (Signed)
Will send for pulmonary function test, chest xray and 6 minute walk test.

## 2017-12-24 NOTE — Telephone Encounter (Signed)
Her symptoms are concerning for orthostasis, however her home BP readings are not consistent with what we saw in our office or at pulmonology. I want her to bring her bP cuff into our office for comparison. She was previously on amlodipine and tolerated this well. Her Coreg was changed due to bradycardia.   1) bring in home BP cuff for comparison 2) check orthostatic vitals when she comes in with home BP cuff 3) we should verify her home BP cuff is accurate prior to making any significant changes to her medications as I do not want home BP to be running elevated and uncontrolled; that said, we can decrease her amlodipine to 2.5 mg daily for now until the above is complete and assessed with further recommendations

## 2017-12-24 NOTE — Telephone Encounter (Signed)
No answer. Left message to call back.   

## 2017-12-24 NOTE — Telephone Encounter (Signed)
S/w patient. She verbalized understanding of recommendations and to decrease amlodipine to 2.5 mg daily. She would prefer me send in a new Rx because it is hard for her to split the tablets. She would also like to try being on the amlodipine 2.5 mg for 1 week to see if this helps instead of coming in for a nurse visit as she lives over 30 min away. Discussed with Thurmond Butts who advised this was ok. Patient will call next week with an update on her status.

## 2017-12-24 NOTE — Telephone Encounter (Signed)
STAT if patient feels like he/she is going to faint   1) Are you dizzy now? Yes   2) Do you feel faint or have you passed out? No   3) Do you have any other symptoms? Lightheaded   4) Have you checked your HR and BP (record if available)? Not this morning    Patient was recently started on amlodipine and thinks this is the cause she wants to go back to taking medication she was on before last ov with Kristen Strickland

## 2017-12-24 NOTE — Telephone Encounter (Signed)
PT IS RETURNING YOUR CALL. STATES HER BP AT HER PULMONOLOGIST WAS 172/88 TODAY. [facility] WENT HOME AND IT WAS 118/56

## 2018-01-01 ENCOUNTER — Ambulatory Visit (INDEPENDENT_AMBULATORY_CARE_PROVIDER_SITE_OTHER): Payer: Medicare Other | Admitting: *Deleted

## 2018-01-01 DIAGNOSIS — R0609 Other forms of dyspnea: Secondary | ICD-10-CM | POA: Diagnosis not present

## 2018-01-01 DIAGNOSIS — R0600 Dyspnea, unspecified: Secondary | ICD-10-CM

## 2018-01-01 NOTE — Progress Notes (Addendum)
SIX MIN WALK 01/01/2018  Medications Amlodipine 5 mg/Asa 81 mg/b12/Estradiol 0.1mg /losartan 1//-25 mg/MVI/Omeprazole 20 mg  Supplimental Oxygen during Test? (L/min) No  Laps 9  Partial Lap (in Meters) 0  Baseline BP (sitting) 108/60  Baseline Heartrate 65  Baseline Dyspnea (Borg Scale) 1  Baseline Fatigue (Borg Scale) 1  Baseline SPO2 97  BP (sitting) 132/60  Heartrate 80  Dyspnea (Borg Scale) 3  Fatigue (Borg Scale) 2  SPO2 97  BP (sitting) 122/68  Heartrate 63  SPO2 97  Distance Completed 432  Tech Comments: Pt walked at a very fast pace while holding a conversation. She did not complain of any sob, chest tightness or pain.     SMW performed today.

## 2018-01-08 ENCOUNTER — Ambulatory Visit: Payer: Medicare Other

## 2018-01-08 ENCOUNTER — Ambulatory Visit: Payer: Medicare Other | Attending: Internal Medicine

## 2018-01-08 DIAGNOSIS — J449 Chronic obstructive pulmonary disease, unspecified: Secondary | ICD-10-CM | POA: Diagnosis not present

## 2018-01-08 DIAGNOSIS — R0600 Dyspnea, unspecified: Secondary | ICD-10-CM | POA: Diagnosis present

## 2018-01-08 DIAGNOSIS — R0609 Other forms of dyspnea: Secondary | ICD-10-CM

## 2018-01-08 MED ORDER — ALBUTEROL SULFATE (2.5 MG/3ML) 0.083% IN NEBU
2.5000 mg | INHALATION_SOLUTION | Freq: Once | RESPIRATORY_TRACT | Status: AC
  Administered 2018-01-08: 2.5 mg via RESPIRATORY_TRACT
  Filled 2018-01-08: qty 3

## 2018-01-21 NOTE — Progress Notes (Signed)
[facility] Consultation      Assessment and Plan:  Dyspnea on exertion.  -Uncertain etiology, however the patient was ambulate briskly in the office today with minimal dyspnea, though she did complain of some lightheadedness which did not affect her gait. -Pulmonary function testing, 6-minute walk, chest x-ray did not reveal any evidence of intrinsic lung disease. --Pt is noted to walk very fast at her normal pace, she was recommend that she should pace herself more. We discussed starting an inhaler empirically, she would like to wait as she does not feel too badly right now.    Return if symptoms worsen or fail to improve.    Date: 01/21/2018  MRN# [id number] ALAYSHIA MARINI 11/01/1945    JULIANA BOLING is a 72 y.o. old female seen in consultation for chief complaint of:    Chief Complaint  Patient presents with  . Follow-up    PFT and SOB: SOB w/activity:     HPI:   RADLEY TESTON is a 72 y.o. female with history of carotid artery stenosis, HTN, HLD, and degenerative disk disease with chronic leg pain.  She was referred from the cardiology office, she was seen there with palpitations and symptoms of dyspnea.  Thus far cardiac workup has been unrevealing, she was thus referred here for further workup.  The patient was noted at last visit to have mild dyspnea with exertion.  When ambulated she walks at a very fast pace, she is able to sustain this but developed some dyspnea.  She underwent pulmonary function testing, chest x-ray, 6-minute walk which not reveal any evidence of intrinsic lung disease. She notices that she has mild dyspnea, particularly when walking in incline, but otherwise she feels that she does well.   She has never smoked, her parents smoked[place]  She has never been diagnosed with respiratory symptoms. She denies sinus drainge, she takes medicine for reflux.   **12/24/17 desat walk at rest, on RA, sat 98% and HR 67.  Patient walked at a very fast pace 540 feet. Sat was 97% and HR 89. She had minimal dyspnea, she felt lightheaded.  **01/01/18; 6-minute walk test no desaturations,  Distance Completed 432  Tech Comments: Pt walked at a very fast pace while holding a conversation. She did not complain of any sob, chest tightness or pain.   **01/08/18 pulmonary function test tracings personally reviewed FVC 72% predicted, FEV1 is 71% predicted, there is no change with bronchodilator, ratio is normal.  Forced expiratory time is 7.8 seconds. TLC is 86% predicted, RV to TLC ratio is normal.  DLCO is 75% predicted.  Flow volume loop is normal. -Overall this test shows well-preserved pulmonary function, slightly reduced spirometry suggestive of restrictive lung disease, versus inadequate patient effort. **Imaging personally reviewed, chest x-ray 12/24/17 normal chest x-ray.  Medication:    Current Outpatient Medications:  .  amLODipine (NORVASC) 2.5 MG tablet, Take 1 tablet (2.5 mg total) by mouth daily., Disp: 30 tablet, Rfl: 4 .  amLODipine (NORVASC) 5 MG tablet, Take 1 tablet (5 mg total) by mouth daily., Disp: 90 tablet, Rfl: 3 .  aspirin EC 81 MG tablet, Take 81 mg by mouth daily., Disp: , Rfl:  .  Cyanocobalamin (VITAMIN B 12) 100 MCG LOZG, Take by mouth daily., Disp: , Rfl:  .  estradiol (CLIMARA - DOSED IN MG/24 HR) 0.1 mg/24hr patch, Place 1 patch (0.1 mg total) onto the skin once a week. APPLY 1  PATCH ON SKIN ONCE WEEKLY., Disp: 12 patch, Rfl: 0 .  losartan-hydrochlorothiazide (HYZAAR) 100-25 MG tablet, TAKE 1 TABLET BY MOUTH ONCE DAILY., Disp: 30 tablet, Rfl: 0 .  Multiple Vitamins-Minerals (CENTRUM PO), Take 1 tablet by mouth daily. , Disp: , Rfl:  .  omeprazole (PRILOSEC) 20 MG capsule, TAKE (1) CAPSULE BY MOUTH TWICE DAILY FOR REFLUX, Disp: 180 capsule, Rfl: 0 .  pravastatin (PRAVACHOL) 10 MG tablet, TAKE 1 TABLET BY MOUTH EVERY MONDAY, WEDNESDAY, AND FRIDAY, Disp: 30 tablet, Rfl: 0 .  traMADol (ULTRAM)  50 MG tablet, Take 1 tablet (50 mg total) by mouth 2 (two) times daily as needed., Disp: 60 tablet, Rfl: 0   Allergies:  Patient has no known allergies.  Review of Systems: Gen:  Denies  fever, sweats, chills HEENT: Denies blurred vision, double vision. bleeds, sore throat Cvc:  No dizziness, chest pain. Resp:   Denies cough or sputum production. Gi: Denies swallowing difficulty, stomach pain. Gu:  Denies bladder incontinence, burning urine Ext:   No Joint pain, stiffness. Skin: No skin rash,  hives  Endoc:  No polyuria, polydipsia. Psych: No depression, insomnia. Other:  All other systems were reviewed with the patient and were negative other that what is mentioned in the HPI.   Physical Examination:   VS: BP 114/60 (BP Location: Left Arm, Cuff Size: Normal)   Pulse 60   Ht 5\' 3"  (1.6 m)   Wt 153 lb (69.4 kg)   SpO2 95%   BMI 27.10 kg/m   General Appearance: No distress  Neuro:without focal findings,  speech normal,  HEENT: PERRLA, EOM intact.   Pulmonary: normal breath sounds, No wheezing.  CardiovascularNormal S1,S2.  No m/r/g.   Abdomen: Benign, Soft, non-tender. Renal:  No costovertebral tenderness  GU:  No performed at this time. Endoc: No evident thyromegaly, no signs of acromegaly. Skin:   warm, no rashes, no ecchymosis  Extremities: normal, no cyanosis, clubbing.  Other findings:    LABORATORY PANEL:   CBC No results for input(s): WBC, HGB, HCT, PLT in the last 168 hours. ------------------------------------------------------------------------------------------------------------------  Chemistries  No results for input(s): NA, K, CL, CO2, GLUCOSE, BUN, CREATININE, CALCIUM, MG, AST, ALT, ALKPHOS, BILITOT in the last 168 hours.  Invalid input(s): GFRCGP ------------------------------------------------------------------------------------------------------------------  Cardiac Enzymes No results for input(s): TROPONINI in the last 168  hours. ------------------------------------------------------------  RADIOLOGY:  No results found.     Thank  you for the consultation and for allowing [facility] to assist in the care of your patient. Our recommendations are noted above.  Please contact us if we can be of further service.   Marda Stalker, MD.  Board Certified in Internal Medicine, Pulmonary Medicine, [place], and Sleep Medicine.  [facility] Office Number: [phone]  Patricia Pesa, M.D.  Merton Border, M.D  01/21/2018

## 2018-01-22 ENCOUNTER — Ambulatory Visit (INDEPENDENT_AMBULATORY_CARE_PROVIDER_SITE_OTHER): Payer: Medicare Other | Admitting: Internal Medicine

## 2018-01-22 ENCOUNTER — Encounter: Payer: Self-pay | Admitting: Internal Medicine

## 2018-01-22 VITALS — BP 114/60 | HR 60 | Ht 63.0 in | Wt 153.0 lb

## 2018-01-22 DIAGNOSIS — R0609 Other forms of dyspnea: Secondary | ICD-10-CM

## 2018-01-22 DIAGNOSIS — I6523 Occlusion and stenosis of bilateral carotid arteries: Secondary | ICD-10-CM | POA: Diagnosis not present

## 2018-01-22 DIAGNOSIS — R0600 Dyspnea, unspecified: Secondary | ICD-10-CM

## 2018-01-22 NOTE — Patient Instructions (Addendum)
Recommend that you pace yourself a bit more, try walking slower so that you can walk for longer distances.  Call us back if breathing declines or does not improve and we can try you on an inhaler.

## 2018-01-26 ENCOUNTER — Telehealth: Payer: Self-pay | Admitting: Internal Medicine

## 2018-01-26 NOTE — Telephone Encounter (Signed)
Copied from [place]. Topic: Quick Communication - Rx Refill/Question >> Jan 26, 2018 10:29 AM [place], Laban Emperor wrote: Medication: traMADol (ULTRAM) 50 MG tablet  Has the patient contacted their pharmacy? Yes.   (Agent: If no, request that the patient contact the pharmacy for the refill.) Preferred Pharmacy (with phone number or street name): [facility], [place] [phone] (Phone) [phone] (Fax)  Pt states that she only takes 1 pill a day. Per AVS it also states pt takes 1 pil a day. (DOS 11/26/17)   Agent: Please be advised that RX refills may take up to 3 business days. We ask that you follow-up with your pharmacy.

## 2018-01-26 NOTE — Telephone Encounter (Signed)
Tried to reach patient by phone line bust no voicemail to see if she has enough medication for to night.

## 2018-01-26 NOTE — Telephone Encounter (Signed)
Pt requesting refill on tramadol. * Last refill was 11/26/17.  * Provider:  Einar Pheasant, MD * LOV:  11/26/17 NOV:  05/27/18 * Pharmacy:  [facility], [place], [place]

## 2018-01-26 NOTE — Telephone Encounter (Signed)
Please advise. Last filled in 11-2017.

## 2018-01-27 NOTE — Telephone Encounter (Signed)
Okay to refill? I changed directions on medication to match how pt is currently taking it (once a day).

## 2018-01-28 MED ORDER — TRAMADOL HCL 50 MG PO TABS: 50.0000 mg | ORAL_TABLET | Freq: Every day | ORAL | 1 refills | Status: DC

## 2018-01-29 NOTE — Telephone Encounter (Signed)
[facility], [place]  [address] [place] [address]  Phone: [phone] Fax: [phone]    Pharmacy called regarding prescription   traMADol (ULTRAM) 50 MG tablet 30 tablet 1 01/28/2018    Sig - Route: Take 1 tablet (50 mg total) by mouth daily. - Oral    Looks like it has been approved but has not been sent to pharmacy.   Pt. Is out of medication.

## 2018-01-29 NOTE — Telephone Encounter (Signed)
Script re-faxed this morning.

## 2018-02-12 ENCOUNTER — Other Ambulatory Visit: Payer: Self-pay | Admitting: Internal Medicine

## 2018-02-16 ENCOUNTER — Other Ambulatory Visit: Payer: Self-pay | Admitting: Internal Medicine

## 2018-03-18 ENCOUNTER — Other Ambulatory Visit: Payer: Self-pay | Admitting: Neurosurgery

## 2018-03-18 DIAGNOSIS — M5416 Radiculopathy, lumbar region: Secondary | ICD-10-CM

## 2018-03-18 NOTE — Progress Notes (Signed)
Cardiology Office Note Date:  03/19/2018  Patient ID:  Kristen Strickland, Kristen Strickland 1946/04/23, MRN [id number] PCP:  Einar Pheasant, MD  Cardiologist:  Dr. Saunders Revel, MD    Chief Complaint: Follow up  History of Present Illness: Kristen Strickland is a 72 y.o. female with history of carotid artery stenosis, HTN, HLD, and degenerative disk disease with chronic leg pain who presents for follow up of palpitations.   She was previously followed by Dr. Yvone Neu, though more recently she has been followed by Dr, End. Exercise Myoview on 02/24/2017 was low risk without ischemia. Small in size, mild in severity, fixed basal inferolateral defect that may reflect artifact or subtle scar, LVEF > 65%. TTE 03/06/2017 showed an EF of 60-65%, normal wall motion, normal diastolic function, mild mitral regurgitation, normal RV size and systolic function, PASP normal. Holter monitor in 02/2017 showed predominant rhythm of sinus with an average heart rate of 66 bpm (range 51-115 bpm), longest R-R interval of 1.3 seconds, rare PACs including atrial couplets and episodes of bigeminy, four atrial runs lasting up to 5 beats with a maximum rate of 157 bpm, rare isolated PVCs, no sustained arrhythmias or prolonged pauses were noted. She was seen in 02/2017 for palpitations and was placed on Coreg with resoluation of palpitations. She was seen on 06/11/2017 and continued to note exertional dyspnea. BP 140/80 at that time. In follow up on 12/18/2017, she was doing well. She did continue to note intermittent exertional dyspnea with modest activity that was stable. She noted improvement in palpitations following the initiation of Coreg. Her BP was noted to be 142/72, she had been out of her amlodipine. Her heart rate was noted to be 51 bpm, and her Coreg was held. She was resumed on amlodipine, though at 5 mg (previously on 2.5 mg). She was continued on Hyzaar. In telephone follow up on 3/6, with complaints of orthostasis. Her amlodipine was decreased to  2.5 mg daily and she was advised to bring her cuff in for comparison. She was seen by pulmonology for evaluation of her SOB with PFT showing mild restrictive lung disease, 6-minute walk test, and CXR were unrevealing. She was noted to ambulate at a very fast pace and was advised to pace herself more. She was offered an inhaler, though declined.   She comes in accompanied by her husband today.  Patient reports she continues to feel intermittent episodes of generalized fatigue and weakness.  She does not correlate these with any exertion or any particular time of day.  Episodes are generally short-lived and spontaneously resolved.  Husband states, "she has aged a lot in the past 2 years."  He is unable to further explain what he means by this.  She has stable DOE.  Patient denies any chest pain, dizziness, presyncope, or syncope.  She does continue to note intermittent palpitations that occur mostly at nighttime when she is laying in the bed.  She reports she generally does not sleep well secondary to chronic neck and back pain.  Husband reports that she does not snore and she does not have any excessive daytime sleepiness.  Weight has remained stable.  She is no longer taking any rate limiting medications.  Past Medical History:  Diagnosis Date  . Arthritis   . Hypercholesterolemia   . Hypertension     Past Surgical History:  Procedure Laterality Date  . ABDOMINAL HYSTERECTOMY  1975  . ANTERIOR CERVICAL DECOMP/DISCECTOMY FUSION N/A 04/09/2013   Procedure: ANTERIOR CERVICAL DECOMPRESSION/DISCECTOMY FUSION 2  LEVELS;  Surgeon: Floyce Stakes, MD;  Location: [facility];  Service: Neurosurgery;  Laterality: N/A;  Cervical five-six Cervical six-seven  Anterior cervical decompression/diskectomy/fusion  . BACK SURGERY     ruptured disc  . RECONSTRUCTION OF NOSE      Current Meds  Medication Sig  . amLODipine (NORVASC) 2.5 MG tablet Take 1 tablet (2.5 mg total) by mouth daily.  Marland Kitchen aspirin EC 81 MG  tablet Take 81 mg by mouth daily.  . Cyanocobalamin (VITAMIN B 12) 100 MCG LOZG Take by mouth daily.  Marland Kitchen losartan-hydrochlorothiazide (HYZAAR) 100-25 MG tablet TAKE 1 TABLET BY MOUTH ONCE DAILY.  . Multiple Vitamins-Minerals (CENTRUM PO) Take 1 tablet by mouth daily.   Marland Kitchen omeprazole (PRILOSEC) 20 MG capsule TAKE (1) CAPSULE BY MOUTH TWICE DAILY FOR REFLUX  . pravastatin (PRAVACHOL) 10 MG tablet TAKE 1 TABLET BY MOUTH EVERY MONDAY, WEDNESDAY, AND FRIDAY  . traMADol (ULTRAM) 50 MG tablet Take 1 tablet (50 mg total) by mouth daily.    Allergies:   Patient has no known allergies.   Social History:  The patient  reports that she has never smoked. She has never used smokeless tobacco. She reports that she drinks alcohol. She reports that she does not use drugs.   Family History:  The patient's family history includes Cancer in her mother; Colon cancer in her father; Heart disease in her mother.  ROS:   Review of Systems  Constitutional: Positive for malaise/fatigue. Negative for chills, diaphoresis, fever and weight loss.  HENT: Negative for congestion.   Eyes: Negative for discharge and redness.  Respiratory: Negative for cough, hemoptysis, sputum production, shortness of breath and wheezing.   Cardiovascular: Negative for chest pain, palpitations, orthopnea, claudication, leg swelling and PND.  Gastrointestinal: Negative for abdominal pain, blood in stool, heartburn, melena, nausea and vomiting.  Genitourinary: Negative for hematuria.  Musculoskeletal: Positive for back pain, myalgias and neck pain. Negative for falls.  Skin: Negative for rash.  Neurological: Positive for weakness. Negative for dizziness, tingling, tremors, sensory change, speech change, focal weakness and loss of consciousness.  Endo/Heme/Allergies: Does not bruise/bleed easily.  Psychiatric/Behavioral: Negative for substance abuse. The patient has insomnia. The patient is not nervous/anxious.   All other systems reviewed and  are negative.    PHYSICAL EXAM:  VS:  BP (!) 141/73 (BP Location: Right Arm, Patient Position: Sitting, Cuff Size: Normal)   Pulse (!) 54   Ht 5\' 4"  (1.626 m)   Wt 148 lb 4 oz (67.2 kg)   BMI 25.45 kg/m  BMI: Body mass index is 25.45 kg/m.  Physical Exam  Constitutional: She is oriented to person, place, and time. She appears well-developed and well-nourished.  HENT:  Head: Normocephalic and atraumatic.  Eyes: Right eye exhibits no discharge. Left eye exhibits no discharge.  Neck: Normal range of motion. No JVD present.  Cardiovascular: Regular rhythm, S1 normal, S2 normal and normal heart sounds. Bradycardia present. Exam reveals no distant heart sounds, no friction rub, no midsystolic click and no opening snap.  No murmur heard. Pulses:      Posterior tibial pulses are 2+ on the right side, and 2+ on the left side.  Pulmonary/Chest: Effort normal and breath sounds normal. No respiratory distress. She has no decreased breath sounds. She has no wheezes. She has no rales. She exhibits no tenderness.  Abdominal: Soft. She exhibits no distension. There is no tenderness.  Musculoskeletal: She exhibits no edema.  Neurological: She is alert and oriented to person, place,  and time.  Skin: Skin is warm and dry. No cyanosis. Nails show no clubbing.  Psychiatric: She has a normal mood and affect. Her speech is normal and behavior is normal. Judgment and thought content normal.     EKG:  Was ordered and interpreted by me today. Shows sinus bradycardia, 54 bpm, no acute ST-T changes  Recent Labs: 11/21/2017: ALT 20; BUN 17; Creatinine, Ser 0.72; Potassium 4.0; Sodium 139  11/21/2017: Cholesterol 195; HDL 59.40; LDL Cholesterol 115; Total CHOL/HDL Ratio 3; Triglycerides 106.0; VLDL 21.2   CrCl cannot be calculated (Patient's most recent lab result is older than the maximum 21 days allowed.).   Wt Readings from Last 3 Encounters:  03/19/18 148 lb 4 oz (67.2 kg)  01/22/18 153 lb (69.4 kg)    12/24/17 151 lb (68.5 kg)    Orthostatic vital signs: Lying: 150/75, 53 bpm Sitting: 150/74, 56 bpm, lightheaded Standing: 144/77, 56 bpm Standing x3-minutes: 152/73, 58 bpm  Other studies reviewed: Additional studies/records reviewed today include: summarized above  ASSESSMENT AND PLAN:  1. Generalized fatigue: Of uncertain etiology.  Thus far, cardiac and pulmonary work-ups have been unrevealing as detailed above.  She has been noted to be operating at a very active pace and has been previously advised to slow down some.  She does not sleep well though per patient and has been really does not have any symptoms concerning for sleep apnea.  Possibly related to some of her resting bradycardia, however she does demonstrate appropriate chronotropic response in the office today.  We will check a Holter monitor as detailed below as well as a TSH, BMP, CBC, and magnesium.  Recommend further work-up per PCP.  2. Bradycardia: Patient was able to demonstrate appropriate chronotropic response with ambulation in our office today.  She achieved a max heart rate of 72 bpm and maintain an oxygen saturation of 96 to 98% on room air.  We will schedule a 24-hour Holter monitor to evaluate her heart rate at home.  She is not on any rate limiting medications.  Check a TSH, BMP, CBC, and magnesium.  Given the fact that she was able to demonstrate an appropriate chronotropic response with ambulation in our office this does not support the diagnosis of sinus node dysfunction and she would unlikely require PPM.  3. Palpitations: Patient denies any symptoms during the day however does note palpitations at nighttime when she is attempting to follow sleep.  Prior outpatient cardiac monitoring did not reveal significant arrhythmia.  We will revisit Holter monitoring as above however, more so secondary to her bradycardia.  Check TSH, BMP, magnesium, and CBC as above.  Due to her bradycardia would avoid AV nodal blocking  agents.  4. Dyspnea on exertion: Stable.  Found to have mild restrictive lung disease by PFTs.  She has declined inhaler.  He has been advised to not ambulate at such a fast pace.  Echocardiogram from 02/2017 showed normal EF and normal PA pressure.  Stress test at that time was low risk as well.  Should her symptoms of generalized fatigue and DOE persist without underlying etiology being discovered could consider right and left cardiac catheterization in follow-up.  5. Hypertension: Has reported significant orthostasis at home leading to the tapering of amlodipine back down to 2.5 mg daily.  However, she is not orthostatic today in the office as detailed above.  I wonder if she truly was orthostatic at home or if something else was underlying driving her symptoms.  However, because of  her complaints, we will not escalate antihypertensive therapy at this time until she has undergone a further work-up.  Disposition: F/u with myself in 4 weeks.  Current medicines are reviewed at length with the patient today.  The patient did not have any concerns regarding medicines.  Signed, Christell Faith, PA-C 03/19/2018 11:18 AM     [facility] [address] [place] [address] [place], [place] [address] [phone]

## 2018-03-19 ENCOUNTER — Ambulatory Visit (INDEPENDENT_AMBULATORY_CARE_PROVIDER_SITE_OTHER): Payer: Medicare Other | Admitting: Physician Assistant

## 2018-03-19 ENCOUNTER — Encounter: Payer: Self-pay | Admitting: Physician Assistant

## 2018-03-19 VITALS — BP 141/73 | HR 54 | Ht 64.0 in | Wt 148.2 lb

## 2018-03-19 DIAGNOSIS — I10 Essential (primary) hypertension: Secondary | ICD-10-CM

## 2018-03-19 DIAGNOSIS — R5383 Other fatigue: Secondary | ICD-10-CM | POA: Diagnosis not present

## 2018-03-19 DIAGNOSIS — R001 Bradycardia, unspecified: Secondary | ICD-10-CM | POA: Diagnosis not present

## 2018-03-19 DIAGNOSIS — R0602 Shortness of breath: Secondary | ICD-10-CM

## 2018-03-19 DIAGNOSIS — R002 Palpitations: Secondary | ICD-10-CM

## 2018-03-19 DIAGNOSIS — I6523 Occlusion and stenosis of bilateral carotid arteries: Secondary | ICD-10-CM

## 2018-03-19 NOTE — Patient Instructions (Signed)
Medication Instructions:  Your physician recommends that you continue on your current medications as directed. Please refer to the Current Medication list given to you today.  Labwork: Bmet, Cbc, Tsh, Magnesium  today  Testing/Procedures: Your physician has recommended that you wear a holter monitor (24 hour). Holter monitors are medical devices that record the heart's electrical activity. Doctors most often use these monitors to diagnose arrhythmias. Arrhythmias are problems with the speed or rhythm of the heartbeat. The monitor is a small, portable device. You can wear one while you do your normal daily activities. This is usually used to diagnose what is causing palpitations/syncope (passing out).    Follow-Up: Your physician recommends that you schedule a follow-up appointment in: 4 weeks   Any Other Special Instructions Will Be Listed Below (If Applicable).     If you need a refill on your cardiac medications before your next appointment, please call your pharmacy.

## 2018-03-20 ENCOUNTER — Telehealth: Payer: Self-pay | Admitting: Physician Assistant

## 2018-03-20 ENCOUNTER — Ambulatory Visit (INDEPENDENT_AMBULATORY_CARE_PROVIDER_SITE_OTHER): Payer: Medicare Other

## 2018-03-20 VITALS — BP 128/62 | HR 59 | Temp 97.9°F | Resp 14 | Ht 63.5 in | Wt 148.8 lb

## 2018-03-20 DIAGNOSIS — Z0000 Encounter for general adult medical examination without abnormal findings: Secondary | ICD-10-CM

## 2018-03-20 DIAGNOSIS — E2839 Other primary ovarian failure: Secondary | ICD-10-CM | POA: Diagnosis not present

## 2018-03-20 LAB — CBC WITH DIFFERENTIAL/PLATELET
Basophils Absolute: 0.1 10*3/uL (ref 0.0–0.2)
Basos: 1 %
EOS (ABSOLUTE): 0.1 10*3/uL (ref 0.0–0.4)
Eos: 2 %
Hematocrit: 39.6 % (ref 34.0–46.6)
Hemoglobin: 13.1 g/dL (ref 11.1–15.9)
Immature Grans (Abs): 0 10*3/uL (ref 0.0–0.1)
Immature Granulocytes: 0 %
Lymphocytes Absolute: 1.4 10*3/uL (ref 0.7–3.1)
Lymphs: 27 %
MCH: 29.4 pg (ref 26.6–33.0)
MCHC: 33.1 g/dL (ref 31.5–35.7)
MCV: 89 fL (ref 79–97)
Monocytes Absolute: 0.4 10*3/uL (ref 0.1–0.9)
Monocytes: 8 %
Neutrophils Absolute: 3.3 10*3/uL (ref 1.4–7.0)
Neutrophils: 62 %
Platelets: 271 10*3/uL (ref 150–450)
RBC: 4.45 x10E6/uL (ref 3.77–5.28)
RDW: 13.4 % (ref 12.3–15.4)
WBC: 5.3 10*3/uL (ref 3.4–10.8)

## 2018-03-20 LAB — BASIC METABOLIC PANEL
BUN/Creatinine Ratio: 20 (ref 12–28)
BUN: 14 mg/dL (ref 8–27)
CO2: 26 mmol/L (ref 20–29)
Calcium: 9.9 mg/dL (ref 8.7–10.3)
Chloride: 101 mmol/L (ref 96–106)
Creatinine, Ser: 0.71 mg/dL (ref 0.57–1.00)
GFR calc Af Amer: 100 mL/min/{1.73_m2} (ref >59)
GFR calc non Af Amer: 87 mL/min/{1.73_m2} (ref >59)
Glucose: 95 mg/dL (ref 65–99)
Potassium: 4.4 mmol/L (ref 3.5–5.2)
Sodium: 141 mmol/L (ref 134–144)

## 2018-03-20 LAB — TSH: TSH: 3.72 u[IU]/mL (ref 0.450–4.500)

## 2018-03-20 LAB — MAGNESIUM: Magnesium: 2 mg/dL (ref 1.6–2.3)

## 2018-03-20 NOTE — Progress Notes (Addendum)
Subjective:   Kristen Strickland is a 72 y.o. female who presents for Medicare Annual (Subsequent) preventive examination.  Review of Systems:  No ROS.  Medicare Wellness Visit. Additional risk factors are reflected in the social history. Cardiac Risk Factors include: advanced age (>34men, >41 women);hypertension     Objective:     Vitals: BP 128/62 (BP Location: Left Arm, Patient Position: Sitting, Cuff Size: Normal)   Pulse (!) 59   Temp 97.9 F (36.6 C) (Oral)   Resp 14   Ht 5' 3.5" (1.613 m)   Wt 148 lb 12.8 oz (67.5 kg)   SpO2 97%   BMI 25.95 kg/m   Body mass index is 25.95 kg/m.  Advanced Directives 03/20/2018 03/19/2017 03/12/2016 04/09/2013 04/08/2013  Does Patient Have a Medical Advance Directive? No No No Patient does not have advance directive Patient does not have advance directive  Would patient like information on creating a medical advance directive? No - Patient declined No - Patient declined No - patient declined information - -  Pre-existing out of facility DNR order (yellow form or pink MOST form) - - - No -    Tobacco Social History   Tobacco Use  Smoking Status Never Smoker  Smokeless Tobacco Never Used     Counseling given: Not Answered   Clinical Intake:  Pre-visit preparation completed: Yes  Pain : No/denies pain     Nutritional Status: BMI 25 -29 Overweight Diabetes: No  How often do you need to have someone help you when you read instructions, pamphlets, or other written materials from your doctor or pharmacy?: 1 - Never  Interpreter Needed?: No     Past Medical History:  Diagnosis Date  . Arthritis   . Hypercholesterolemia   . Hypertension    Past Surgical History:  Procedure Laterality Date  . ABDOMINAL HYSTERECTOMY  1975  . ANTERIOR CERVICAL DECOMP/DISCECTOMY FUSION N/A 04/09/2013   Procedure: ANTERIOR CERVICAL DECOMPRESSION/DISCECTOMY FUSION 2 LEVELS;  Surgeon: Floyce Stakes, MD;  Location: [facility];  Service:  Neurosurgery;  Laterality: N/A;  Cervical five-six Cervical six-seven  Anterior cervical decompression/diskectomy/fusion  . BACK SURGERY     ruptured disc  . RECONSTRUCTION OF NOSE     Family History  Problem Relation Age of Onset  . Cancer Mother        ovarian/uterine  . Heart disease Mother   . Heart attack Mother   . Stroke Mother   . Colon cancer Father   . Dementia Father   . COPD Brother   . Congestive Heart Failure Brother   . Stroke Daughter   . Heart attack Daughter   . Breast cancer Neg Hx    Social History   Socioeconomic History  . Marital status: Married    Spouse name: Not on file  . Number of children: 2  . Years of education: Not on file  . Highest education level: Not on file  Occupational History  . Not on file  Social Needs  . Financial resource strain: Not hard at all  . Food insecurity:    Worry: Never true    Inability: Never true  . Transportation needs:    Medical: No    Non-medical: No  Tobacco Use  . Smoking status: Never Smoker  . Smokeless tobacco: Never Used  Substance and Sexual Activity  . Alcohol use: Yes    Alcohol/week: 0.0 oz    Comment: rarely  . Drug use: No  . Sexual activity: Yes  Lifestyle  . Physical activity:    Days per week: Not on file    Minutes per session: Not on file  . Stress: Not on file  Relationships  . Social connections:    Talks on phone: Not on file    Gets together: Not on file    Attends religious service: Not on file    Active member of club or organization: Not on file    Attends meetings of clubs or organizations: Not on file    Relationship status: Not on file  Other Topics Concern  . Not on file  Social History Narrative  . Not on file    Outpatient Encounter Medications as of 03/20/2018  Medication Sig  . amLODipine (NORVASC) 2.5 MG tablet Take 1 tablet (2.5 mg total) by mouth daily.  Marland Kitchen aspirin EC 81 MG tablet Take 81 mg by mouth daily.  . Cyanocobalamin (VITAMIN B 12) 100 MCG LOZG  Take by mouth daily.  Marland Kitchen losartan-hydrochlorothiazide (HYZAAR) 100-25 MG tablet TAKE 1 TABLET BY MOUTH ONCE DAILY.  . Multiple Vitamins-Minerals (CENTRUM PO) Take 1 tablet by mouth daily.   Marland Kitchen omeprazole (PRILOSEC) 20 MG capsule TAKE (1) CAPSULE BY MOUTH TWICE DAILY FOR REFLUX  . pravastatin (PRAVACHOL) 10 MG tablet TAKE 1 TABLET BY MOUTH EVERY MONDAY, WEDNESDAY, AND FRIDAY  . traMADol (ULTRAM) 50 MG tablet Take 1 tablet (50 mg total) by mouth daily.   No facility-administered encounter medications on file as of 03/20/2018.     Activities of Daily Living In your present state of health, do you have any difficulty performing the following activities: 03/20/2018  Hearing? N  Vision? N  Difficulty concentrating or making decisions? Y  Comment Notes she has difficulty focusing.  States, "My words don't come out right, what I want to say is not what comes out when I speak."  Dressing or bathing? N  Doing errands, shopping? N  Preparing Food and eating ? N  Using the Toilet? N  In the past six months, have you accidently leaked urine? N  Do you have problems with loss of bowel control? N  Managing your Medications? N  Managing your Finances? N  Housekeeping or managing your Housekeeping? N  Some recent data might be hidden    Patient Care Team: Einar Pheasant, MD as PCP - General (Internal Medicine)    Assessment:   This is a routine wellness examination for Kristen Strickland. The goal of the wellness visit is to assist the patient how to close the gaps in care and create a preventative care plan for the patient.   The roster of all physicians providing medical care to patient is listed in the Snapshot section of the chart.  Osteoporosis risk reviewed.  Dexa Scan orderded.   Safety issues reviewed; Smoke and carbon monoxide detectors in the home. No firearms in the home. Wears seatbelts when driving or riding with others. No violence in the home.  They do not have excessive sun exposure.   Discussed the need for sun protection: hats, long sleeves and the use of sunscreen if there is significant sun exposure.  Patient is alert, normal appearance, oriented to person/place/and time.  Correctly identified the president of the [place] and recalls of 1/3 words.  States she has difficulty focusing.  Can read the time from a watch face.   BMI- discussed the importance of a healthy diet, water intake and the benefits of aerobic exercise. Educational material provided.   24 hour diet recall: Regular diet  Dental- every 6 months.  Eye- Visual acuity not assessed per patient preference since they have regular follow up with the ophthalmologist.  Wears corrective lenses.  Sleep patterns- Sleeps without issues.   Dexa Scan ordered; follow as directed.  Educational material provided.  TDAP vaccine deferred per patient preference.  Follow up with insurance.  Educational material provided.  Patient Concerns: None at this time. Follow up with PCP as needed.  Exercise Activities and Dietary recommendations Current Exercise Habits: The patient does not participate in regular exercise at present  Goals    . Increase physical activity     Walk for exercise       Fall Risk Fall Risk  03/20/2018 03/19/2017 03/12/2016 02/08/2015 10/05/2014  Falls in the past year? Yes No Yes No No  Number falls in past yr: 1 - 1 - -  Injury with Fall? No - No - -  Comment She missed a step off of a ladder but did not get hurt.  - - - -  Risk for fall due to : - - - - -  Risk for fall due to: Comment - - - - -  Follow up - - Education provided;Falls prevention discussed - -   Depression Screen PHQ 2/9 Scores 03/20/2018 03/19/2017 03/12/2016 02/08/2015  PHQ - 2 Score 0 0 0 0  PHQ- 9 Score - 0 - -     Cognitive Function MMSE - Mini Mental State Exam 03/20/2018 03/19/2017 03/12/2016  Orientation to time 5 5 5   Orientation to Place 5 5 5   Registration 3 3 3   Attention/ Calculation 4 3 5     Attention/Calculation-comments - difficulty with simple calculation -  Recall 1 1 2   Language- name 2 objects 2 2 2   Language- repeat 1 1 1   Language- follow 3 step command 3 3 3   Language- read & follow direction 1 1 1   Write a sentence 1 1 1   Copy design 1 1 1   Total score 27 26 29          There is no immunization history on file for this patient.  Screening Tests Health Maintenance  Topic Date Due  . TETANUS/TDAP  04/24/1966  . DEXA SCAN  04/24/2012  . COLONOSCOPY  03/04/2024  . Hepatitis C Screening  Completed  . INFLUENZA VACCINE  Discontinued  . MAMMOGRAM  Discontinued  . PNA vac Low Risk Adult  Discontinued      Plan:   End of life planning; Advanced aging; Advanced directives discussed.  No HCPOA/Living Will.  Additional information declined at this time.  I have personally reviewed and noted the following in the patient's chart:   . Medical and social history . Use of alcohol, tobacco or illicit drugs  . Current medications and supplements . Functional ability and status . Nutritional status . Physical activity . Advanced directives . List of other physicians . Hospitalizations, surgeries, and ER visits in previous 12 months . Vitals . Screenings to include cognitive, depression, and falls . Referrals and appointments  In addition, I have reviewed and discussed with patient certain preventive protocols, quality metrics, and best practice recommendations. A written personalized care plan for preventive services as well as general preventive health recommendations were provided to patient.     Varney Biles, LPN  [id number]/[id number]   Reviewed above information.  Agree with assessment and plan.    Dr Nicki Reaper

## 2018-03-20 NOTE — Patient Instructions (Addendum)
  Kristen Strickland , Thank you for taking time to come for your Medicare Wellness Visit. I appreciate your ongoing commitment to your health goals. Please review the following plan we discussed and let me know if I can assist you in the future.   These are the goals we discussed: Goals    . Increase physical activity     Walk for exercise       This is a list of the screening recommended for you and due dates:  Health Maintenance  Topic Date Due  . Tetanus Vaccine  04/24/1966  . DEXA scan (bone density measurement)  04/24/2012  . Colon Cancer Screening  03/04/2024  .  Hepatitis C: One time screening is recommended by Center for Disease Control  (CDC) for  adults born from [birth year] through 1964.   Completed  . Flu Shot  Discontinued  . Mammogram  Discontinued  . Pneumonia vaccines  Discontinued   Bone Densitometry Bone densitometry is an imaging test that uses a special X-ray to measure the amount of calcium and other minerals in your bones (bone density). This test is also known as a bone mineral density test or dual-energy X-ray absorptiometry (DXA). The test can measure bone density at your hip and your spine. It is similar to having a regular X-ray. You may have this test to:  Diagnose a condition that causes weak or thin bones (osteoporosis).  Predict your risk of a broken bone (fracture).  Determine how well osteoporosis treatment is working.  Tell a health care provider about:  Any allergies you have.  All medicines you are taking, including vitamins, herbs, eye drops, creams, and over-the-counter medicines.  Any problems you or family members have had with anesthetic medicines.  Any blood disorders you have.  Any surgeries you have had.  Any medical conditions you have.  Possibility of pregnancy.  Any other medical test you had within the previous 14 days that used contrast material. What are the risks? Generally, this is a safe procedure. However, problems can occur and  may include the following:  This test exposes you to a very small amount of radiation.  The risks of radiation exposure may be greater to unborn children.  What happens before the procedure?  Do not take any calcium supplements for 24 hours before having the test. You can otherwise eat and drink what you usually do.  Take off all metal jewelry, eyeglasses, dental appliances, and any other metal objects. What happens during the procedure?  You may lie on an exam table. There will be an X-ray generator below you and an imaging device above you.  Other devices, such as boxes or braces, may be used to position your body properly for the scan.  You will need to lie still while the machine slowly scans your body.  The images will show up on a computer monitor. What happens after the procedure? You may need more testing at a later time. This information is not intended to replace advice given to you by your health care provider. Make sure you discuss any questions you have with your health care provider. Document Released: 10/29/2004 Document Revised: 03/14/2016 Document Reviewed: 03/17/2014 Elsevier Interactive Patient Education  2018 [employer].

## 2018-03-20 NOTE — Telephone Encounter (Signed)
Pt calling stating she was looking over her medication list that we gave her yesterday at the end of her visit She noticed it did not have her taking the Carvedilol 3.125 mg twice a day  She is just wanting to make sure she is still to take this   Please call back

## 2018-03-20 NOTE — Telephone Encounter (Signed)
I directly asked the patient in her visit on 03/19/2018 if she had stopped the Coreg as she was directed to on 12/18/2017 and she replied that she had. She is now stating that she never stopped Coreg. She has again been advised to hold Coreg. We will see how her symptoms trend with the holding of Coreg.

## 2018-03-20 NOTE — Telephone Encounter (Signed)
Spoke with patient and reviewed recommendations to hold carvedilol. She was extremely embarrassed that she had taken something that she was told to stop at previous visit. Reviewed all medications with her and instructed her to STOP carvedilol. She verbalized understanding and was agreeable to still have monitor placed next week. She verbalized understanding of our conversation, agreement with plan, and had no further questions or concerns.

## 2018-03-20 NOTE — Telephone Encounter (Signed)
Patient returning call about her results. She also stated that she is taking carvedilol and this was not noted on her list. Advised that I would make provider aware and then I would give her a call back. She verbalized understanding with no further questions at this time.

## 2018-03-25 ENCOUNTER — Ambulatory Visit (INDEPENDENT_AMBULATORY_CARE_PROVIDER_SITE_OTHER): Payer: Medicare Other

## 2018-03-25 DIAGNOSIS — R5383 Other fatigue: Secondary | ICD-10-CM | POA: Diagnosis not present

## 2018-03-25 DIAGNOSIS — R001 Bradycardia, unspecified: Secondary | ICD-10-CM

## 2018-03-27 ENCOUNTER — Ambulatory Visit
Admission: RE | Admit: 2018-03-27 | Discharge: 2018-03-27 | Disposition: A | Discharge status: Home / Self Care | Payer: Medicare Other | Source: Ambulatory Visit | Attending: Physician Assistant | Admitting: Physician Assistant

## 2018-03-27 ENCOUNTER — Other Ambulatory Visit: Payer: Self-pay | Admitting: Internal Medicine

## 2018-03-27 DIAGNOSIS — R5383 Other fatigue: Secondary | ICD-10-CM | POA: Diagnosis not present

## 2018-03-27 DIAGNOSIS — R001 Bradycardia, unspecified: Secondary | ICD-10-CM | POA: Diagnosis not present

## 2018-03-27 MED ORDER — TRAMADOL HCL 50 MG PO TABS: 50.0000 mg | ORAL_TABLET | Freq: Every day | ORAL | 1 refills | Status: DC

## 2018-03-27 NOTE — Telephone Encounter (Signed)
See below

## 2018-03-27 NOTE — Telephone Encounter (Signed)
Refill of Tramadol  LOV 11/26/17  Dr. Nicki Reaper  [facility] 01/28/18  #30  0 refills  [place]

## 2018-03-27 NOTE — Telephone Encounter (Signed)
refill 

## 2018-03-27 NOTE — Telephone Encounter (Signed)
Copied from [facility] [id number]. Topic: Quick Communication - Rx Refill/Question >> Mar 27, 2018 10:30 AM Antonieta Iba C wrote: Medication: Tramadol - pt would like to know if provider is able to provide refills.   Has the patient contacted their pharmacy?  (Agent: If no, request that the patient contact the pharmacy for the refill.) (Agent: If yes, when and what did the pharmacy advise?)  Preferred Pharmacy (with phone number or street name): [place], [place] [phone] (Phone) [phone] (Fax)      Agent: Please be advised that RX refills may take up to 3 business days. We ask that you follow-up with your pharmacy.

## 2018-04-01 ENCOUNTER — Ambulatory Visit
Admission: RE | Admit: 2018-04-01 | Discharge: 2018-04-01 | Disposition: A | Discharge status: Home / Self Care | Payer: Medicare Other | Source: Ambulatory Visit | Attending: Neurosurgery | Admitting: Neurosurgery

## 2018-04-01 DIAGNOSIS — M5416 Radiculopathy, lumbar region: Secondary | ICD-10-CM

## 2018-04-01 DIAGNOSIS — M545 Low back pain: Secondary | ICD-10-CM | POA: Diagnosis not present

## 2018-04-01 MED ORDER — IOPAMIDOL (ISOVUE-M 200) INJECTION 41%
1.0000 mL | Freq: Once | INTRAMUSCULAR | Status: AC
  Administered 2018-04-01: 1 mL via EPIDURAL

## 2018-04-01 MED ORDER — METHYLPREDNISOLONE ACETATE 40 MG/ML INJ SUSP (RADIOLOG
120.0000 mg | Freq: Once | INTRAMUSCULAR | Status: AC
  Administered 2018-04-01: 120 mg via EPIDURAL

## 2018-04-01 NOTE — Discharge Instructions (Signed)

## 2018-04-02 ENCOUNTER — Encounter: Payer: Self-pay | Admitting: Internal Medicine

## 2018-04-03 DIAGNOSIS — M25511 Pain in right shoulder: Secondary | ICD-10-CM | POA: Diagnosis not present

## 2018-04-03 DIAGNOSIS — M7541 Impingement syndrome of right shoulder: Secondary | ICD-10-CM | POA: Diagnosis not present

## 2018-04-15 DIAGNOSIS — M19072 Primary osteoarthritis, left ankle and foot: Secondary | ICD-10-CM | POA: Diagnosis not present

## 2018-04-15 DIAGNOSIS — M79672 Pain in left foot: Secondary | ICD-10-CM | POA: Diagnosis not present

## 2018-04-17 ENCOUNTER — Other Ambulatory Visit: Payer: Self-pay | Admitting: Internal Medicine

## 2018-04-21 IMAGING — XA Imaging study
2 series · 2 of 2 positions shown · non-contrast
Comparison: none

CLINICAL DATA: Lumbosacral spondylosis without myelopathy.
Bilateral low back pain. No radicular complaints. L5-S1 disc
degeneration.

[Series 1: ortho standard · 1 of 1 slices shown (1 of 2)]
[im 1/1]
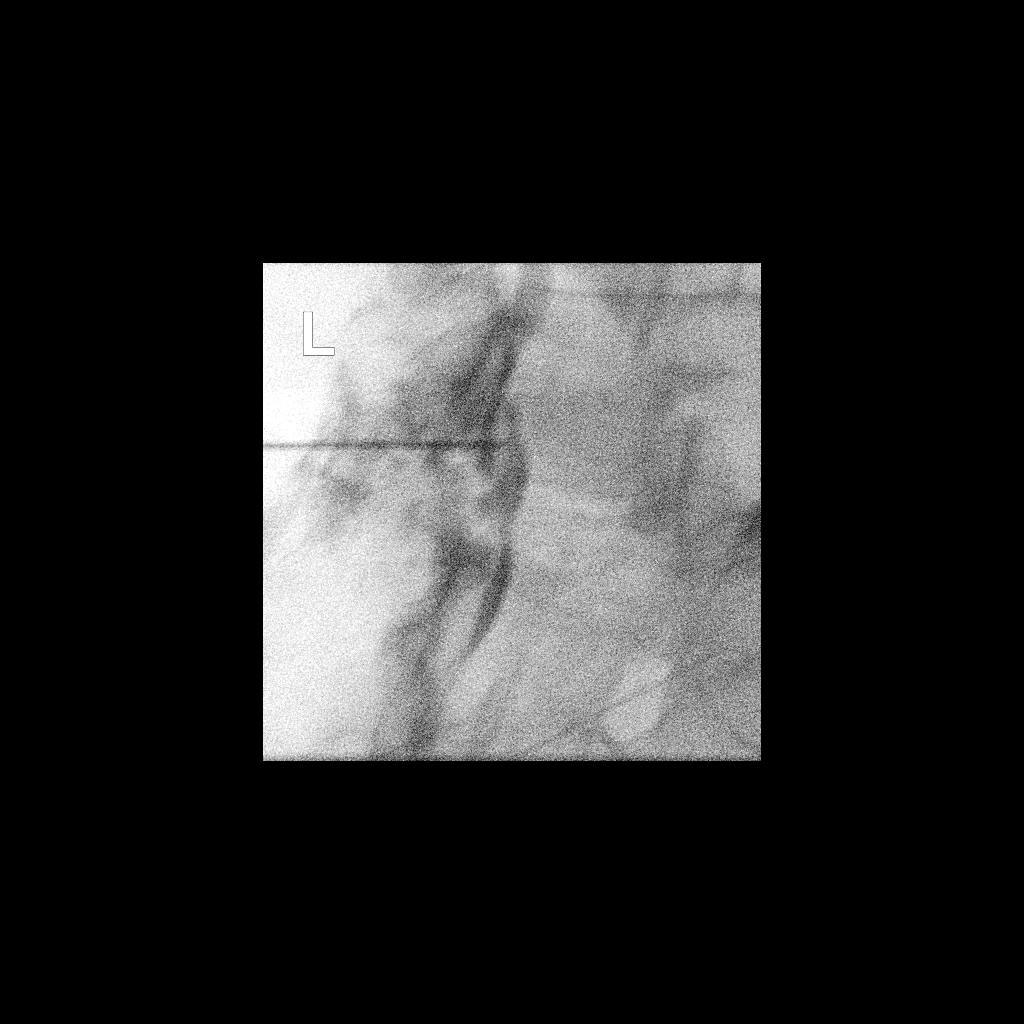

[Series 2: ortho standard · 1 of 1 slices shown (2 of 2)]
[im 1/1]
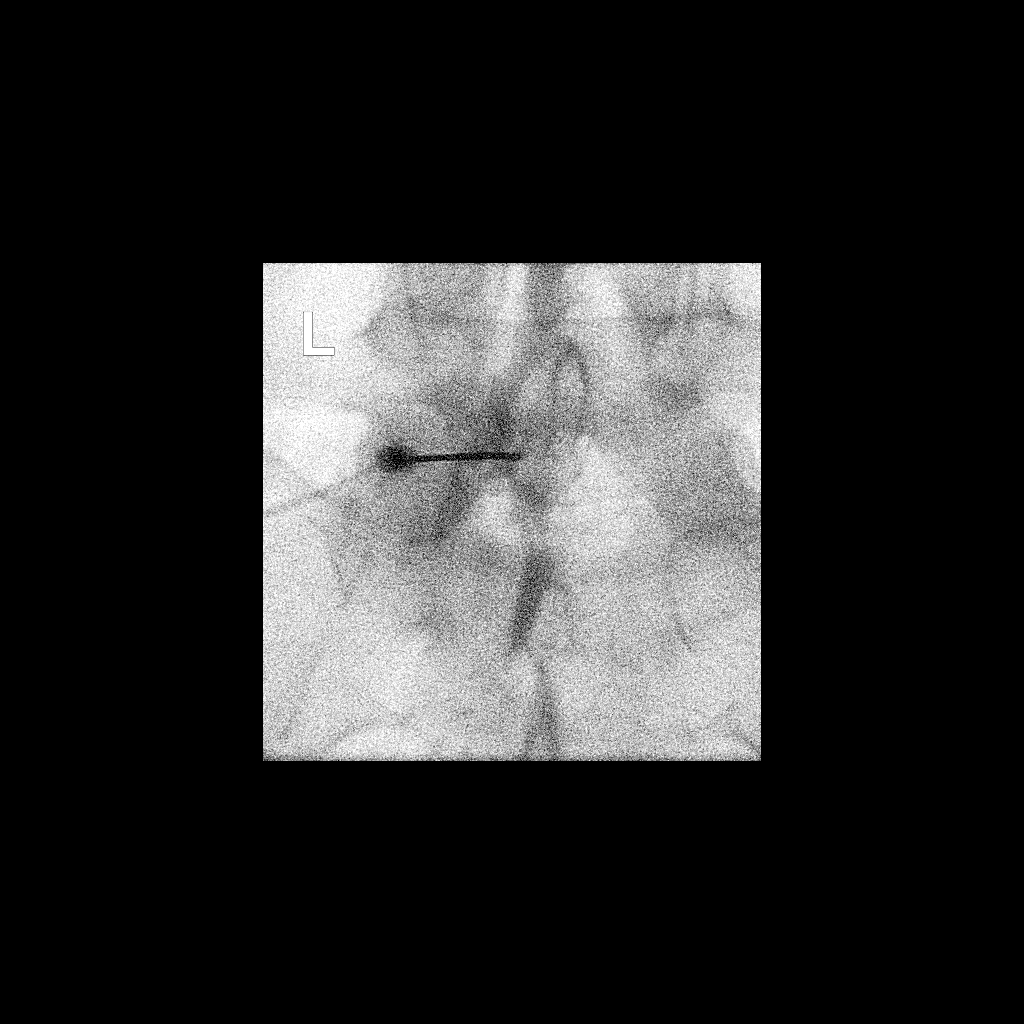

[2 of 2 positions shown; findings below may reference images not displayed]

FLUOROSCOPY TIME:  Radiation Exposure Index (as provided by the
fluoroscopic device): 12.85 microGray*m^2

Fluoroscopy Time (in minutes and seconds):  8 seconds

PROCEDURE:
The procedure, risks, benefits, and alternatives were explained to
the patient. Questions regarding the procedure were encouraged and
answered. The patient understands and consents to the procedure.

LUMBAR EPIDURAL INJECTION:

An interlaminar approach was performed on the left at L5-S1. The
overlying skin was cleansed and anesthetized. A 3.5 inch 20 gauge
epidural needle was advanced using loss-of-resistance technique.

DIAGNOSTIC EPIDURAL INJECTION:

Injection of Isovue-M 200 initially demonstrated mixed spread with a
significant amount of contrast extending posterior to the ligament.
The needle was advanced slightly and the bevel adjusted, and
additional contrast injection showed a good epidural pattern with
spread above and below the level of needle placement, primarily on
the left. No vascular opacification is seen.

THERAPEUTIC EPIDURAL INJECTION:

120 mg of Depo-Medrol mixed with 3 mL of 1% lidocaine were
instilled. The procedure was well-tolerated, and the patient was
discharged thirty minutes following the injection in good condition.

COMPLICATIONS:
None
IMPRESSION: Technically successful lumbar interlaminar epidural injection on the
left at L5-S1.

## 2018-04-22 ENCOUNTER — Ambulatory Visit (INDEPENDENT_AMBULATORY_CARE_PROVIDER_SITE_OTHER): Payer: Medicare Other | Admitting: Nurse Practitioner

## 2018-04-22 ENCOUNTER — Encounter: Payer: Self-pay | Admitting: Nurse Practitioner

## 2018-04-22 VITALS — BP 140/70 | HR 61 | Ht 64.0 in | Wt 148.8 lb

## 2018-04-22 DIAGNOSIS — I10 Essential (primary) hypertension: Secondary | ICD-10-CM | POA: Diagnosis not present

## 2018-04-22 DIAGNOSIS — I6523 Occlusion and stenosis of bilateral carotid arteries: Secondary | ICD-10-CM | POA: Diagnosis not present

## 2018-04-22 DIAGNOSIS — R5382 Chronic fatigue, unspecified: Secondary | ICD-10-CM | POA: Diagnosis not present

## 2018-04-22 DIAGNOSIS — R002 Palpitations: Secondary | ICD-10-CM

## 2018-04-22 NOTE — Progress Notes (Signed)
Office Visit    Patient Name: Kristen Strickland Date of Encounter: 04/22/2018  Primary Care Provider:  Einar Pheasant, MD Primary Cardiologist:  Nelva Bush, MD  Chief Complaint    72 year old female with a history of palpitations, hypertension, hyperlipidemia, sinus bradycardia,  moderate carotid stenoses, degenerative disc disease with chronic leg pain, and fatigue, who presents for follow-up.  Past Medical History    Past Medical History:  Diagnosis Date  . Arthritis   . Carotid arterial disease ([place])    a. 11/2017 Carotid U/S: RICA 2-72%, LICA 53-66%.   . Chronic fatigue   . Chronic leg pain   . DDD (degenerative disc disease), lumbar   . Dyspnea on exertion    a. 02/2017 Echo: EF 60-65%, no rwma, mild MR. Nl RV size/fxn. Nl PASP; b. 02/2017 MV: small, mild, fixed basal inferolateral defect - ? artifact vs scar. No ischemia. EF >65%.  . Hypercholesterolemia   . Hypertension   . Insomnia   . Palpitations    a. 02/2017 Holter: Avg HR 66 (51-115), rare PACs, four brief runs of PAT - up to 5 beats, max rate 157. Rare isolated PVCs. No sustained arrhythmias; b. 03/2018 24h Holter: Rare PACs, 3 beat run PAT (122 bpm), PVCs (2% of beats).  . Sinus bradycardia    Past Surgical History:  Procedure Laterality Date  . ABDOMINAL HYSTERECTOMY  1975  . ANTERIOR CERVICAL DECOMP/DISCECTOMY FUSION N/A 04/09/2013   Procedure: ANTERIOR CERVICAL DECOMPRESSION/DISCECTOMY FUSION 2 LEVELS;  Surgeon: Floyce Stakes, MD;  Location: [facility];  Service: Neurosurgery;  Laterality: N/A;  Cervical five-six Cervical six-seven  Anterior cervical decompression/diskectomy/fusion  . BACK SURGERY     ruptured disc  . RECONSTRUCTION OF NOSE      Allergies  No Known Allergies  History of Present Illness    72 year old female with the above past medical history palpitations, hypertension, hyperlipidemia, sinus bradycardia,  moderate carotid stenoses, degenerative disc disease with chronic leg  pain, and fatigue.  She has previously been evaluated with stress testing in May 2018, which was low risk/without ischemia.  Echocardiogram performed at the same time showed normal LV function.  She underwent Holter monitoring in May 2018 as well which did not show any significant arrhythmias.  She did have PACs  with a few brief episodes of atrial tachycardia and was placed on beta-blocker therapy with improvement in palpitations.  Due to bradycardia on beta-blocker therapy, beta-blocker was discontinued in February of this year.  Shortly thereafter, she reported orthostatic symptoms requiring reduction in amlodipine dosing.  She has continued to have dyspnea on exertion and fatigue and has been evaluated by pulmonology.  PFTs earlier this year showed mild restrictive lung disease while 6-minute walk test and chest x-ray were normal.  She was offered an inhaler but declined.  She was recently seen in clinic in late May with ongoing fatigue and intermittent weakness.  She also reported intermittent nocturnal palpitations.  Holter monitoring was performed and showed rare PACs, a 3 beat run of atrial tachycardia, and occasional PVCs accounting for 2% of total beats.  Since her last visit, she reports being relatively stable.  She tires easily during the day and naps for about 3 hours every afternoon.  She does not initially have dyspnea on exertion but simply becomes tired and sleepy after being up for several hours or performing a chore.  She admits to disrupted sleep in the setting of low back pain, restless legs, and calf cramping.  She had an injection in her low back about 3 weeks ago which helped with pain but she says at best, she gets about 5 hours of sleep and much of that is fairly disrupted.  She rarely has palpitations at this point.  She denies any chest pain, PND, orthopnea, dizziness, syncope, edema, or early satiety.  Home Medications    Prior to Admission medications   Medication Sig Start Date  End Date Taking? Authorizing Provider  amLODipine (NORVASC) 2.5 MG tablet Take 1 tablet (2.5 mg total) by mouth daily. 12/24/17 03/24/18  Rise Mu, PA-C  aspirin EC 81 MG tablet Take 81 mg by mouth daily.    [provider]  Cyanocobalamin (VITAMIN B 12) 100 MCG LOZG Take by mouth daily.    [provider]  losartan-hydrochlorothiazide (HYZAAR) 100-25 MG tablet TAKE 1 TABLET BY MOUTH ONCE DAILY. 02/12/18   Einar Pheasant, MD  Multiple Vitamins-Minerals (CENTRUM PO) Take 1 tablet by mouth daily.     [provider]  omeprazole (PRILOSEC) 20 MG capsule TAKE (1) CAPSULE BY MOUTH TWICE DAILY FOR REFLUX 09/15/17   Einar Pheasant, MD  pravastatin (PRAVACHOL) 10 MG tablet TAKE 1 TABLET BY MOUTH EVERY MONDAY, WEDNESDAY, AND FRIDAY 04/17/18   Einar Pheasant, MD  traMADol (ULTRAM) 50 MG tablet Take 1 tablet (50 mg total) by mouth daily. 03/27/18   Einar Pheasant, MD    Review of Systems    Disrupted sleep with daytime sleepiness and fatigue.  S occasionally notes a brief palpitation.  He denies chest pain, dyspnea, PND, orthopnea, dizziness, syncope, edema, or early satiety.  All other systems reviewed and are otherwise negative except as noted above.  Physical Exam    VS:  BP 140/70 (BP Location: Left Arm, Patient Position: Sitting, Cuff Size: Normal)   Pulse 61   Ht 5\' 4"  (1.626 m)   Wt 148 lb 12 oz (67.5 kg)   BMI 25.53 kg/m  , BMI Body mass index is 25.53 kg/m. GEN: Well nourished, well developed, in no acute distress.  HEENT: normal.  Neck: Supple, no JVD, carotid bruits, or masses. Cardiac: RRR, no murmurs, rubs, or gallops. No clubbing, cyanosis, edema.  Radials/DP/PT 2+ and equal bilaterally.  Respiratory:  Respirations regular and unlabored, clear to auscultation bilaterally. GI: Soft, nontender, nondistended, BS + x 4. MS: no deformity or atrophy. Skin: warm and dry, no rash. Neuro:  Strength and sensation are intact. Psych: Normal affect.  Accessory  Clinical Findings    ECG - regular sinus rhythm, 61, no acute ST or T changes.  Assessment & Plan    1.  Palpitations: Stable.  Recent Holter monitoring showed PACs and PVCs accounting for up to 2% of beats.  She is not particularly bothered by these at this time.  She is not on an AV nodal blocking agent secondary to baseline bradycardia which was worsened by carvedilol in the past.  2.  Fatigue: Most recent evaluation included blood work which showed normal CBC, basic metabolic panel, and TSH.  As above, Holter monitoring did not show any significant arrhythmias or bradycardia to account for symptoms.  Upon discussing this further with her today, she admits to difficulty sleeping and disruptive sleep in the setting of back pain, restless legs, and sometimes leg cramping.  She does not believe that she snores.  At best, she thinks she gets about 5 hours of sleep each night and feels tired throughout the following day.  She often will sleep for 3 to  3-1/2 hours every afternoon.  I suspect sleep disruption is the primary contributor to her daytime fatigue and somnolence.  I did offer her a referral for sleep study but she wishes to forego this at this time.  I recommended she follow-up with primary care for consideration of a sleep aid.  She might try melatonin over-the-counter.  3.  Essential hypertension: Blood pressure elevated this morning however she has yet to take her morning medicines.  She says blood pressure typically runs in the 120s to 130s.  4.  Disposition: Follow-up with Dr. Saunders Revel in 6 to 9 months.   Murray Hodgkins, NP 04/22/2018, 10:03 AM

## 2018-04-22 NOTE — Patient Instructions (Signed)
Medication Instructions:  Your physician recommends that you continue on your current medications as directed. Please refer to the Current Medication list given to you today.   Labwork: None ordered  Testing/Procedures: None ordered  Follow-Up: Your physician wants you to follow-up in: 9 months with Dr.End  You will receive a reminder letter in the mail two months in advance. If you don't receive a letter, please call our office to schedule the follow-up appointment.   Any Other Special Instructions Will Be Listed Below (If Applicable).     If you need a refill on your cardiac medications before your next appointment, please call your pharmacy.

## 2018-04-29 ENCOUNTER — Ambulatory Visit: Payer: Medicare Other

## 2018-04-29 ENCOUNTER — Ambulatory Visit
Admission: RE | Admit: 2018-04-29 | Discharge: 2018-04-29 | Disposition: A | Discharge status: Home / Self Care | Payer: Medicare Other | Source: Ambulatory Visit | Attending: Internal Medicine | Admitting: Internal Medicine

## 2018-04-29 DIAGNOSIS — E2839 Other primary ovarian failure: Secondary | ICD-10-CM | POA: Insufficient documentation

## 2018-04-29 DIAGNOSIS — Z780 Asymptomatic menopausal state: Secondary | ICD-10-CM | POA: Diagnosis not present

## 2018-04-29 DIAGNOSIS — Z13820 Encounter for screening for osteoporosis: Secondary | ICD-10-CM | POA: Diagnosis not present

## 2018-05-22 ENCOUNTER — Other Ambulatory Visit: Payer: Medicare Other

## 2018-05-25 ENCOUNTER — Other Ambulatory Visit (INDEPENDENT_AMBULATORY_CARE_PROVIDER_SITE_OTHER): Payer: Medicare Other

## 2018-05-25 DIAGNOSIS — R5383 Other fatigue: Secondary | ICD-10-CM

## 2018-05-25 DIAGNOSIS — E7800 Pure hypercholesterolemia, unspecified: Secondary | ICD-10-CM

## 2018-05-25 DIAGNOSIS — I10 Essential (primary) hypertension: Secondary | ICD-10-CM | POA: Diagnosis not present

## 2018-05-25 LAB — CBC WITH DIFFERENTIAL/PLATELET
Basophils Absolute: 0.1 10*3/uL (ref 0.0–0.1)
Basophils Relative: 1 % (ref 0.0–3.0)
Eosinophils Absolute: 0.1 10*3/uL (ref 0.0–0.7)
Eosinophils Relative: 1.2 % (ref 0.0–5.0)
HCT: 39.4 % (ref 36.0–46.0)
Hemoglobin: 13.3 g/dL (ref 12.0–15.0)
Lymphocytes Relative: 25 % (ref 12.0–46.0)
Lymphs Abs: 1.4 10*3/uL (ref 0.7–4.0)
MCHC: 33.9 g/dL (ref 30.0–36.0)
MCV: 89.7 fl (ref 78.0–100.0)
Monocytes Absolute: 0.3 10*3/uL (ref 0.1–1.0)
Monocytes Relative: 5.6 % (ref 3.0–12.0)
Neutro Abs: 3.7 10*3/uL (ref 1.4–7.7)
Neutrophils Relative %: 67.2 % (ref 43.0–77.0)
Platelets: 254 10*3/uL (ref 150.0–400.0)
RBC: 4.39 Mil/uL (ref 3.87–5.11)
RDW: 13.5 % (ref 11.5–15.5)
WBC: 5.5 10*3/uL (ref 4.0–10.5)

## 2018-05-25 LAB — LIPID PANEL
Cholesterol: 224 mg/dL — ABNORMAL HIGH (ref 0–200)
HDL: 47.9 mg/dL (ref >39.00)
LDL Cholesterol: 145 mg/dL — ABNORMAL HIGH (ref 0–99)
NonHDL: 175.94
Total CHOL/HDL Ratio: 5
Triglycerides: 154 mg/dL — ABNORMAL HIGH (ref 0.0–149.0)
VLDL: 30.8 mg/dL (ref 0.0–40.0)

## 2018-05-25 LAB — HEPATIC FUNCTION PANEL
ALT: 17 U/L (ref 0–35)
AST: 15 U/L (ref 0–37)
Albumin: 4.2 g/dL (ref 3.5–5.2)
Alkaline Phosphatase: 67 U/L (ref 39–117)
Bilirubin, Direct: 0.1 mg/dL (ref 0.0–0.3)
Total Bilirubin: 0.9 mg/dL (ref 0.2–1.2)
Total Protein: 6.9 g/dL (ref 6.0–8.3)

## 2018-05-25 LAB — BASIC METABOLIC PANEL
BUN: 12 mg/dL (ref 6–23)
CO2: 28 mEq/L (ref 19–32)
Calcium: 9.7 mg/dL (ref 8.4–10.5)
Chloride: 102 mEq/L (ref 96–112)
Creatinine, Ser: 0.66 mg/dL (ref 0.40–1.20)
GFR: 93.81 mL/min (ref >60.00)
Glucose, Bld: 113 mg/dL — ABNORMAL HIGH (ref 70–99)
Potassium: 3.7 mEq/L (ref 3.5–5.1)
Sodium: 138 mEq/L (ref 135–145)

## 2018-05-25 LAB — TSH: TSH: 3.71 u[IU]/mL (ref 0.35–4.50)

## 2018-05-27 ENCOUNTER — Encounter: Payer: Self-pay | Admitting: Internal Medicine

## 2018-05-27 ENCOUNTER — Ambulatory Visit (INDEPENDENT_AMBULATORY_CARE_PROVIDER_SITE_OTHER): Payer: Medicare Other | Admitting: Internal Medicine

## 2018-05-27 DIAGNOSIS — R5382 Chronic fatigue, unspecified: Secondary | ICD-10-CM | POA: Diagnosis not present

## 2018-05-27 DIAGNOSIS — I6523 Occlusion and stenosis of bilateral carotid arteries: Secondary | ICD-10-CM

## 2018-05-27 DIAGNOSIS — M545 Low back pain, unspecified: Secondary | ICD-10-CM

## 2018-05-27 DIAGNOSIS — R0609 Other forms of dyspnea: Secondary | ICD-10-CM

## 2018-05-27 DIAGNOSIS — N951 Menopausal and female climacteric states: Secondary | ICD-10-CM | POA: Diagnosis not present

## 2018-05-27 DIAGNOSIS — E7800 Pure hypercholesterolemia, unspecified: Secondary | ICD-10-CM | POA: Diagnosis not present

## 2018-05-27 DIAGNOSIS — I10 Essential (primary) hypertension: Secondary | ICD-10-CM

## 2018-05-27 DIAGNOSIS — R0600 Dyspnea, unspecified: Secondary | ICD-10-CM

## 2018-05-27 MED ORDER — PRAVASTATIN SODIUM 10 MG PO TABS: 10.0000 mg | ORAL_TABLET | Freq: Every day | ORAL | 5 refills | Status: DC

## 2018-05-27 NOTE — Progress Notes (Signed)
Patient ID: Kristen Strickland, female   DOB: 1946/02/15, 72 y.o.   MRN: [id number]   Subjective:    Patient ID: Kristen Strickland, female    DOB: February 06, 1946, 72 y.o.   MRN: [id number]  HPI  Patient here for a scheduled follow up.  She has been seeing cardiology.  Stress test 02/2017 - low risk/without ischemia.  ECHO - normal LV function.  holter monitor - PACs with a few brief episodes of atrial tachycardia.  Placed on beta-blocker initially.  Had problems with bradycardia.  Beta blocker discontinued.  Amlodipine dose decreased secondary to orthostatic symptoms.  Evaluated by pulmonology. PFTs - mild restrictive lung disease.  Chest xray normal.  Still with fatigue.  Does not sleep well.  Discussed restless legs.  Discussed hot flashes.  Discussed sleep apnea.   She was using estrogen patches.  Did not feel helped.  Would like to keep her off estrogen if possible.  Discussed sleep apnea.  Discussed treatment to help her sleep.  Discussed melatonin.  Has tried previously and helped.  Some increased back pain.  Taking tramadol.  Plans to f/u with her back doctor.     Past Medical History:  Diagnosis Date  . Arthritis   . Carotid arterial disease ([place])    a. 11/2017 Carotid U/S: RICA 0-26%, LICA 37-85%.   . Chronic fatigue   . Chronic leg pain   . DDD (degenerative disc disease), lumbar   . Dyspnea on exertion    a. 02/2017 Echo: EF 60-65%, no rwma, mild MR. Nl RV size/fxn. Nl PASP; b. 02/2017 MV: small, mild, fixed basal inferolateral defect - ? artifact vs scar. No ischemia. EF >65%.  . Hypercholesterolemia   . Hypertension   . Insomnia   . Palpitations    a. 02/2017 Holter: Avg HR 66 (51-115), rare PACs, four brief runs of PAT - up to 5 beats, max rate 157. Rare isolated PVCs. No sustained arrhythmias; b. 03/2018 24h Holter: Rare PACs, 3 beat run PAT (122 bpm), PVCs (2% of beats).  . Sinus bradycardia    Past Surgical History:  Procedure Laterality Date  . ABDOMINAL HYSTERECTOMY  1975  . ANTERIOR  CERVICAL DECOMP/DISCECTOMY FUSION N/A 04/09/2013   Procedure: ANTERIOR CERVICAL DECOMPRESSION/DISCECTOMY FUSION 2 LEVELS;  Surgeon: Floyce Stakes, MD;  Location: [facility];  Service: Neurosurgery;  Laterality: N/A;  Cervical five-six Cervical six-seven  Anterior cervical decompression/diskectomy/fusion  . BACK SURGERY     ruptured disc  . RECONSTRUCTION OF NOSE     Family History  Problem Relation Age of Onset  . Cancer Mother        ovarian/uterine  . Heart disease Mother   . Heart attack Mother   . Stroke Mother   . Colon cancer Father   . Dementia Father   . COPD Brother   . Congestive Heart Failure Brother   . Stroke Daughter   . Heart attack Daughter   . Breast cancer Neg Hx    Social History   Socioeconomic History  . Marital status: Married    Spouse name: Not on file  . Number of children: 2  . Years of education: Not on file  . Highest education level: Not on file  Occupational History  . Not on file  Social Needs  . Financial resource strain: Not hard at all  . Food insecurity:    Worry: Never true    Inability: Never true  . Transportation needs:    Medical: No  Non-medical: No  Tobacco Use  . Smoking status: Never Smoker  . Smokeless tobacco: Never Used  Substance and Sexual Activity  . Alcohol use: Yes    Alcohol/week: 0.0 standard drinks    Comment: rarely  . Drug use: No  . Sexual activity: Yes  Lifestyle  . Physical activity:    Days per week: Not on file    Minutes per session: Not on file  . Stress: Not on file  Relationships  . Social connections:    Talks on phone: Not on file    Gets together: Not on file    Attends religious service: Not on file    Active member of club or organization: Not on file    Attends meetings of clubs or organizations: Not on file    Relationship status: Not on file  Other Topics Concern  . Not on file  Social History Narrative  . Not on file    Outpatient Encounter Medications as of 05/27/2018    Medication Sig  . aspirin EC 81 MG tablet Take 81 mg by mouth daily.  . Cyanocobalamin (VITAMIN B 12) 100 MCG LOZG Take by mouth daily.  Marland Kitchen losartan-hydrochlorothiazide (HYZAAR) 100-25 MG tablet TAKE 1 TABLET BY MOUTH ONCE DAILY.  . Multiple Vitamins-Minerals (CENTRUM PO) Take 1 tablet by mouth daily.   Marland Kitchen omeprazole (PRILOSEC) 20 MG capsule TAKE (1) CAPSULE BY MOUTH TWICE DAILY FOR REFLUX  . pravastatin (PRAVACHOL) 10 MG tablet Take 1 tablet (10 mg total) by mouth daily.  . traMADol (ULTRAM) 50 MG tablet Take 1 tablet (50 mg total) by mouth daily.  . [DISCONTINUED] pravastatin (PRAVACHOL) 10 MG tablet TAKE 1 TABLET BY MOUTH EVERY MONDAY, WEDNESDAY, AND FRIDAY  . [DISCONTINUED] amLODipine (NORVASC) 2.5 MG tablet Take 1 tablet (2.5 mg total) by mouth daily.   No facility-administered encounter medications on file as of 05/27/2018.     Review of Systems  Constitutional: Negative for appetite change and unexpected weight change.  HENT: Negative for congestion and sinus pressure.   Respiratory: Negative for cough and chest tightness.   Cardiovascular: Negative for chest pain, palpitations and leg swelling.  Gastrointestinal: Negative for abdominal pain, nausea and vomiting.  Genitourinary: Negative for difficulty urinating and dysuria.  Musculoskeletal: Negative for joint swelling and myalgias.       Back pain as outlined.    Skin: Negative for color change and rash.  Neurological: Negative for dizziness, light-headedness and headaches.  Psychiatric/Behavioral: Positive for sleep disturbance. Negative for agitation and dysphoric mood.       Objective:    Physical Exam  Constitutional: She appears well-developed and well-nourished. No distress.  HENT:  Nose: Nose normal.  Mouth/Throat: Oropharynx is clear and moist.  Neck: Neck supple. No thyromegaly present.  Cardiovascular: Normal rate and regular rhythm.  Pulmonary/Chest: Breath sounds normal. No respiratory distress. She has no  wheezes.  Abdominal: Soft. Bowel sounds are normal. There is no tenderness.  Musculoskeletal: She exhibits no edema or tenderness.  Lymphadenopathy:    She has no cervical adenopathy.  Skin: No rash noted. No erythema.  Psychiatric: She has a normal mood and affect. Her behavior is normal.    BP 140/80 (BP Location: Left Arm, Patient Position: Sitting, Cuff Size: Normal)   Pulse 67   Temp 97.6 F (36.4 C) (Oral)   Resp 18   Wt 151 lb 6.4 oz (68.7 kg)   SpO2 97%   BMI 25.99 kg/m  Wt Readings from Last 3 Encounters:  05/27/18  151 lb 6.4 oz (68.7 kg)  04/22/18 148 lb 12 oz (67.5 kg)  03/20/18 148 lb 12.8 oz (67.5 kg)     Lab Results  Component Value Date   WBC 5.5 05/25/2018   HGB 13.3 05/25/2018   HCT 39.4 05/25/2018   PLT 254.0 05/25/2018   GLUCOSE 113 (H) 05/25/2018   CHOL 224 (H) 05/25/2018   TRIG 154.0 (H) 05/25/2018   HDL 47.90 05/25/2018   LDLDIRECT 151.7 08/16/2013   LDLCALC 145 (H) 05/25/2018   ALT 17 05/25/2018   AST 15 05/25/2018   NA 138 05/25/2018   K 3.7 05/25/2018   CL 102 05/25/2018   CREATININE 0.66 05/25/2018   BUN 12 05/25/2018   CO2 28 05/25/2018   TSH 3.71 05/25/2018    Dexascan  Result Date: 04/29/2018 EXAM: DUAL X-RAY ABSORPTIOMETRY (DXA) FOR BONE MINERAL DENSITY IMPRESSION: Dear Dr Einar Pheasant, Your patient Jasminne Mealy completed a BMD test on 04/29/2018 using the [place] (analysis version: 14.10) manufactured by [employer]. The following summarizes the results of our evaluation. PATIENT BIOGRAPHICAL: Name: Taylr, Meuth Patient ID: [id number] Birth Date: 12/29/1945 Height: 63.0 in. Gender: Female Exam Date: 04/29/2018 Weight: 149.2 lbs. Indications: Advanced Age, Caucasian, Height Loss, Hysterectomy, Osteoarthritis, POSTmenopausal Fractures: Treatments: 81 MG ASPIRIN, multivitamin, patch ASSESSMENT: The BMD measured at Femur Neck Left is 1.018 g/cm2 with a T-score of -0.1. This patient is considered normal  according to [place] [facility]) criteria. Site Region Measured Measured WHO Young Adult BMD Date       Age      Classification T-score AP Spine L1-L4 04/29/2018 71.0 Normal 1.2 1.329 g/cm2 DualFemur Neck Left 04/29/2018 71.0 Normal -0.1 1.018 g/cm2 Left Forearm Radius 33% 04/29/2018 71.0 Normal 0.1 0.882 g/cm2 World Health Organization [facility]) criteria for post-menopausal, Caucasian Women: Normal:       T-score at or above -1 SD Osteopenia:   T-score between -1 and -2.5 SD Osteoporosis: T-score at or below -2.5 SD RECOMMENDATIONS: 1. All patients should optimize calcium and vitamin D intake. 2. Consider FDA-approved medical therapies in postmenopausal women and men aged 95 years and older, based on the following: a. A hip or vertebral (clinical or morphometric) fracture b. T-score < -2.5 at the femoral neck or spine after appropriate evaluation to exclude secondary causes c. Low bone mass (T-score between -1.0 and -2.5 at the femoral neck or spine) and a 10-year probability of a hip fracture > 3% or a 10-year probability of a major osteoporosis-related fracture > 20% based on the [place]-adapted WHO algorithm d. Clinician judgment and/or patient preferences may indicate treatment for people with 10-year fracture probabilities above or below these levels FOLLOW-UP: Patients with diagnosed cases of osteoporosis or at high risk for fracture should have regular bone mineral density tests. For patients eligible for Medicare, routine testing is allowed once every 2 years. The testing frequency can be increased to one year for patients who have rapidly progressing disease, those who are receiving or discontinuing medical therapy to restore bone mass, or have additional risk factors. I have reviewed this report, and agree with the above findings. [facility] Electronically Signed   By: Lowella Grip III M.D.   On: 04/29/2018 14:45       Assessment & Plan:   Problem List Items Addressed This Visit      Back pain    Previous injection helped.  Taking tramadol.  Plans to f/u with her back doctor regarding the increased pain.  Carotid stenosis    Followed by [facility].       Relevant Medications   pravastatin (PRAVACHOL) 10 MG tablet   Dyspnea on exertion    Has seen pulmonary and cardiology.  W/up as outlined.  Stable.  Follow.       Fatigue    Feel is multifactorial.  Hold on restarting estrogen.  Discussed sleep study.  She declines.  Discussed melatonin.  She will retry.  Wants to try this first and see how she does with this medication.  Follow.       Hypercholesteremia    Discussed lab results.  Pravastatin 10mg  q day.  Low cholesterol diet and exercise.  Follow lipid panel and liver function tests.        Relevant Medications   pravastatin (PRAVACHOL) 10 MG tablet   Other Relevant Orders   Hepatic function panel   Lipid panel   Hypertension    Blood pressure as outlined.  Follow blood pressure.  Follow metabolic panel.        Relevant Medications   pravastatin (PRAVACHOL) 10 MG tablet   Other Relevant Orders   Basic metabolic panel   Menopausal symptoms    Was on estrogen patch.  Tapered off.  She is still having hot flashes.  Will hold on restarting estrogen.  See if we can get her sleeping better. Follow.            Einar Pheasant, MD

## 2018-05-29 ENCOUNTER — Other Ambulatory Visit: Payer: Self-pay

## 2018-05-29 MED ORDER — AMLODIPINE BESYLATE 2.5 MG PO TABS: 2.5000 mg | ORAL_TABLET | Freq: Every day | ORAL | 2 refills | Status: DC

## 2018-05-29 NOTE — Telephone Encounter (Signed)
*  STAT* If patient is at the pharmacy, call can be transferred to refill team.   1. Which medications need to be refilled? (please list name of each medication and dose if known) Amlodipine  2. Which pharmacy/location (including street and city if local pharmacy) is medication to be sent to? [facility]  3. Do they need a 30 day or 90 day supply? [place]

## 2018-05-30 ENCOUNTER — Encounter: Payer: Self-pay | Admitting: Internal Medicine

## 2018-05-30 NOTE — Assessment & Plan Note (Signed)
Previous injection helped.  Taking tramadol.  Plans to f/u with her back doctor regarding the increased pain.

## 2018-05-30 NOTE — Assessment & Plan Note (Signed)
Feel is multifactorial.  Hold on restarting estrogen.  Discussed sleep study.  She declines.  Discussed melatonin.  She will retry.  Wants to try this first and see how she does with this medication.  Follow.

## 2018-05-30 NOTE — Assessment & Plan Note (Signed)
Was on estrogen patch.  Tapered off.  She is still having hot flashes.  Will hold on restarting estrogen.  See if we can get her sleeping better. Follow.

## 2018-05-30 NOTE — Assessment & Plan Note (Signed)
Followed by [facility].   

## 2018-05-30 NOTE — Assessment & Plan Note (Signed)
Blood pressure as outlined.  Follow blood pressure.  Follow metabolic panel.

## 2018-05-30 NOTE — Assessment & Plan Note (Signed)
Has seen pulmonary and cardiology.  W/up as outlined.  Stable.  Follow.

## 2018-05-30 NOTE — Assessment & Plan Note (Signed)
Discussed lab results.  Pravastatin 10mg  q day.  Low cholesterol diet and exercise.  Follow lipid panel and liver function tests.

## 2018-07-10 ENCOUNTER — Telehealth: Payer: Self-pay | Admitting: Internal Medicine

## 2018-07-10 MED ORDER — TRAMADOL HCL 50 MG PO TABS: 50.0000 mg | ORAL_TABLET | Freq: Every day | ORAL | 0 refills | Status: DC

## 2018-07-10 NOTE — Telephone Encounter (Signed)
Patient requesting refill on Tramadol last OV 05/27/18 last refill 03/27/18 for 30 with ne refill.

## 2018-07-10 NOTE — Telephone Encounter (Signed)
Copied from [place] [id number]. Topic: General - Other >> Jul 10, 2018  3:07 PM Lennox Solders wrote: Reason for CRM: pt is calling and needs a refill on tramadol. Pt said she talk with pharm also she called office twice . [place]

## 2018-07-10 NOTE — Telephone Encounter (Signed)
This is the one we discussed.

## 2018-07-10 NOTE — Telephone Encounter (Signed)
Patient said she knew you had mentioned this but she had forgotten to say anything to her back doctor she says if you will fill this time she will discuss with him before next refill.

## 2018-07-10 NOTE — Telephone Encounter (Signed)
OK to fill

## 2018-07-10 NOTE — Telephone Encounter (Signed)
We had discussed with her regarding getting the tramadol from her back doctor.

## 2018-07-11 NOTE — Telephone Encounter (Signed)
Data base reviewed.  Tramadol #30 with no refills - rx faxed to pharmacy.

## 2018-07-15 ENCOUNTER — Other Ambulatory Visit: Payer: Self-pay | Admitting: Internal Medicine

## 2018-07-22 IMAGING — MG MM DIGITAL SCREENING BILAT W/ CAD
4 series · 4 of 4 positions shown · non-contrast
Comparison: Previous exam(s).

CLINICAL DATA: Screening.

EXAM:
DIGITAL SCREENING BILATERAL MAMMOGRAM WITH CAD

[L MLO]
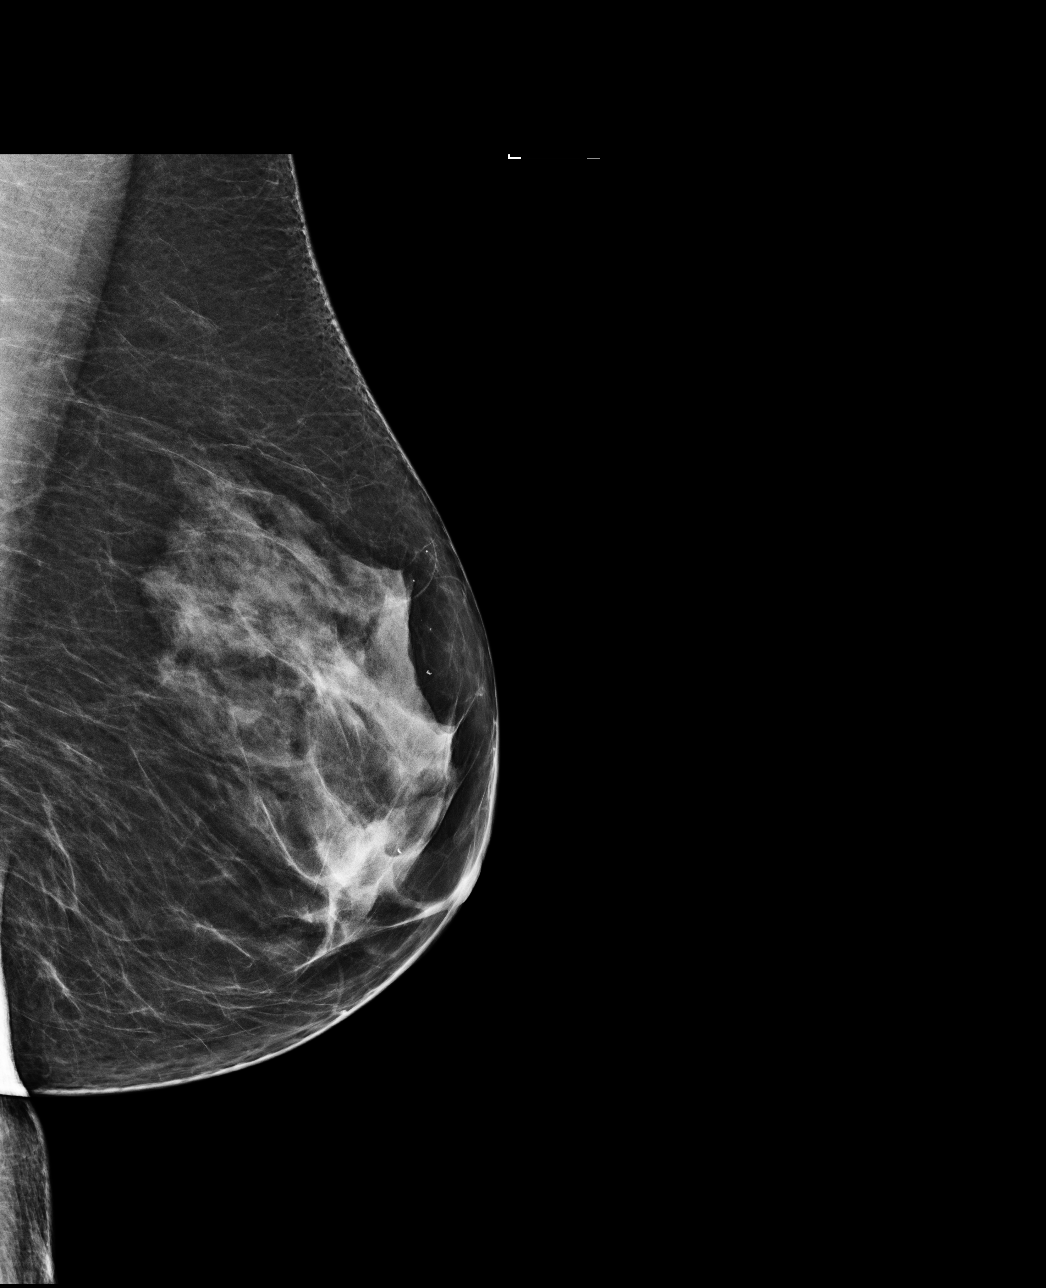

[R MLO]
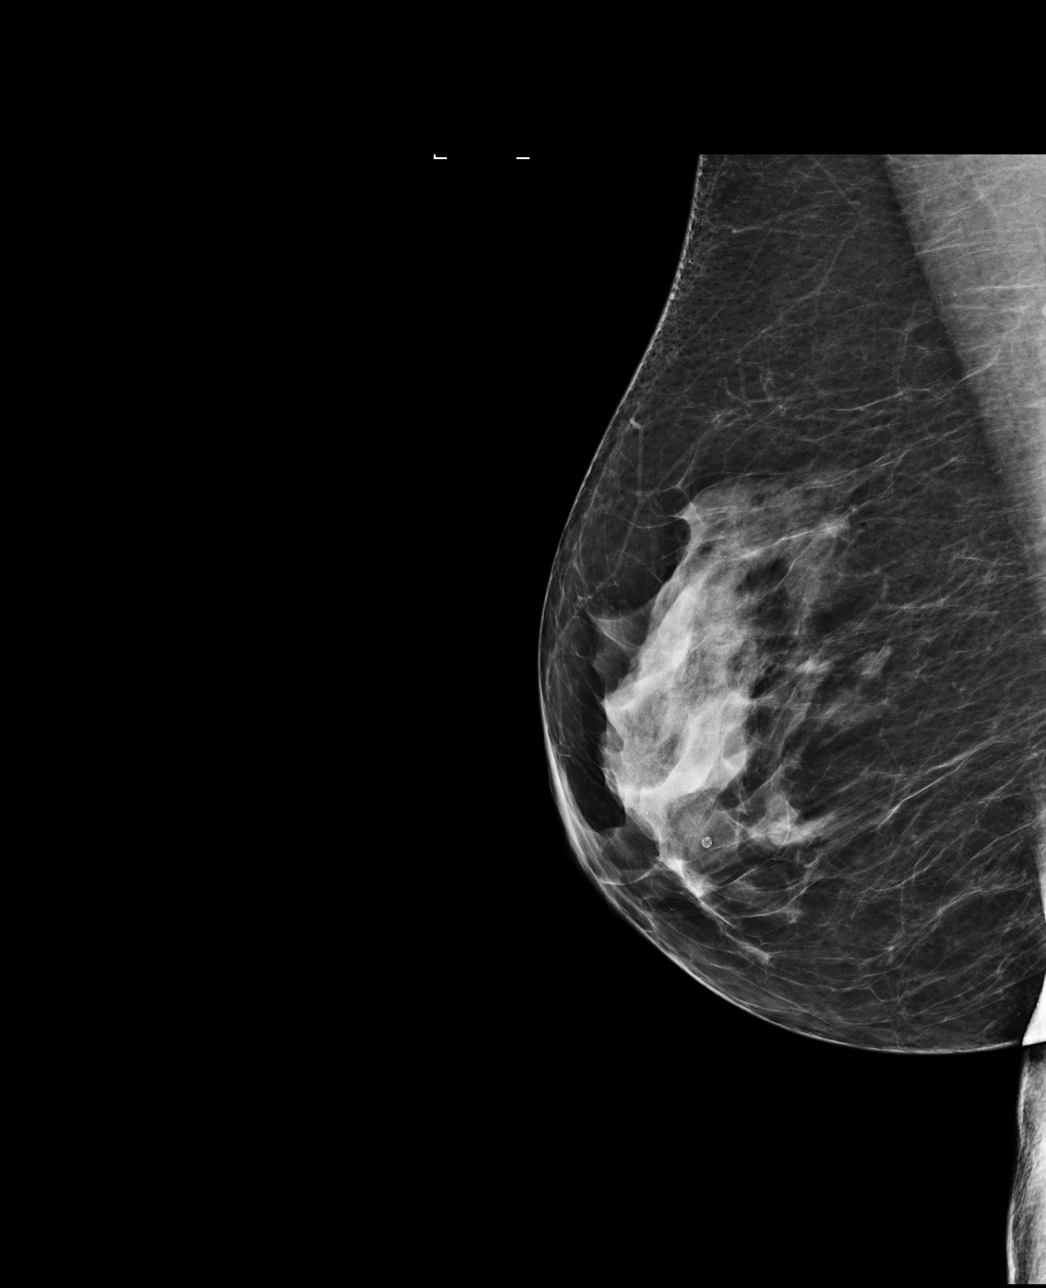

[R CC]
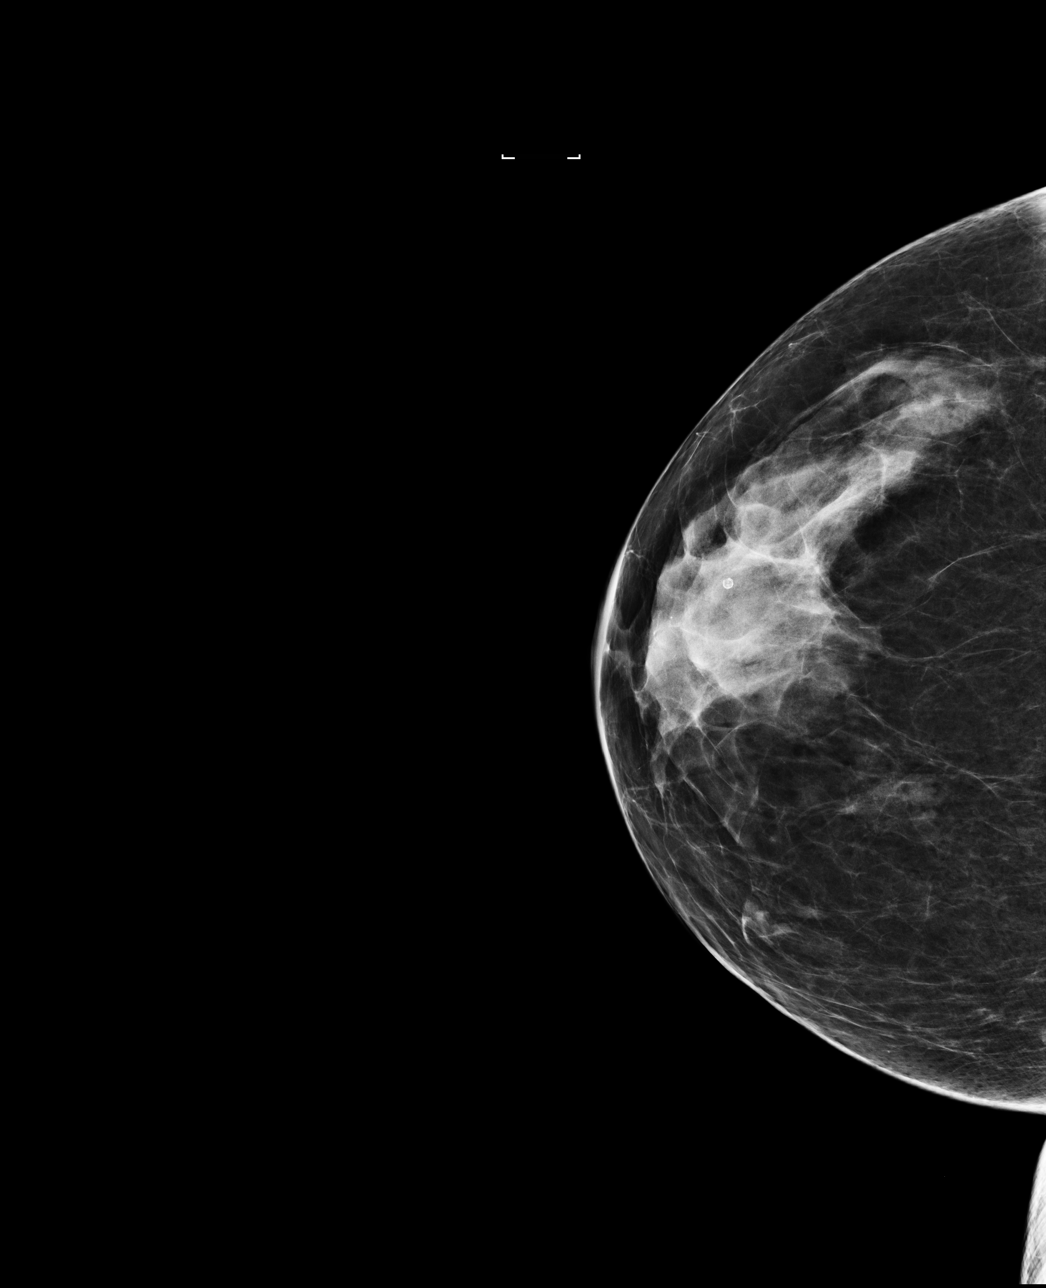

[L CC]
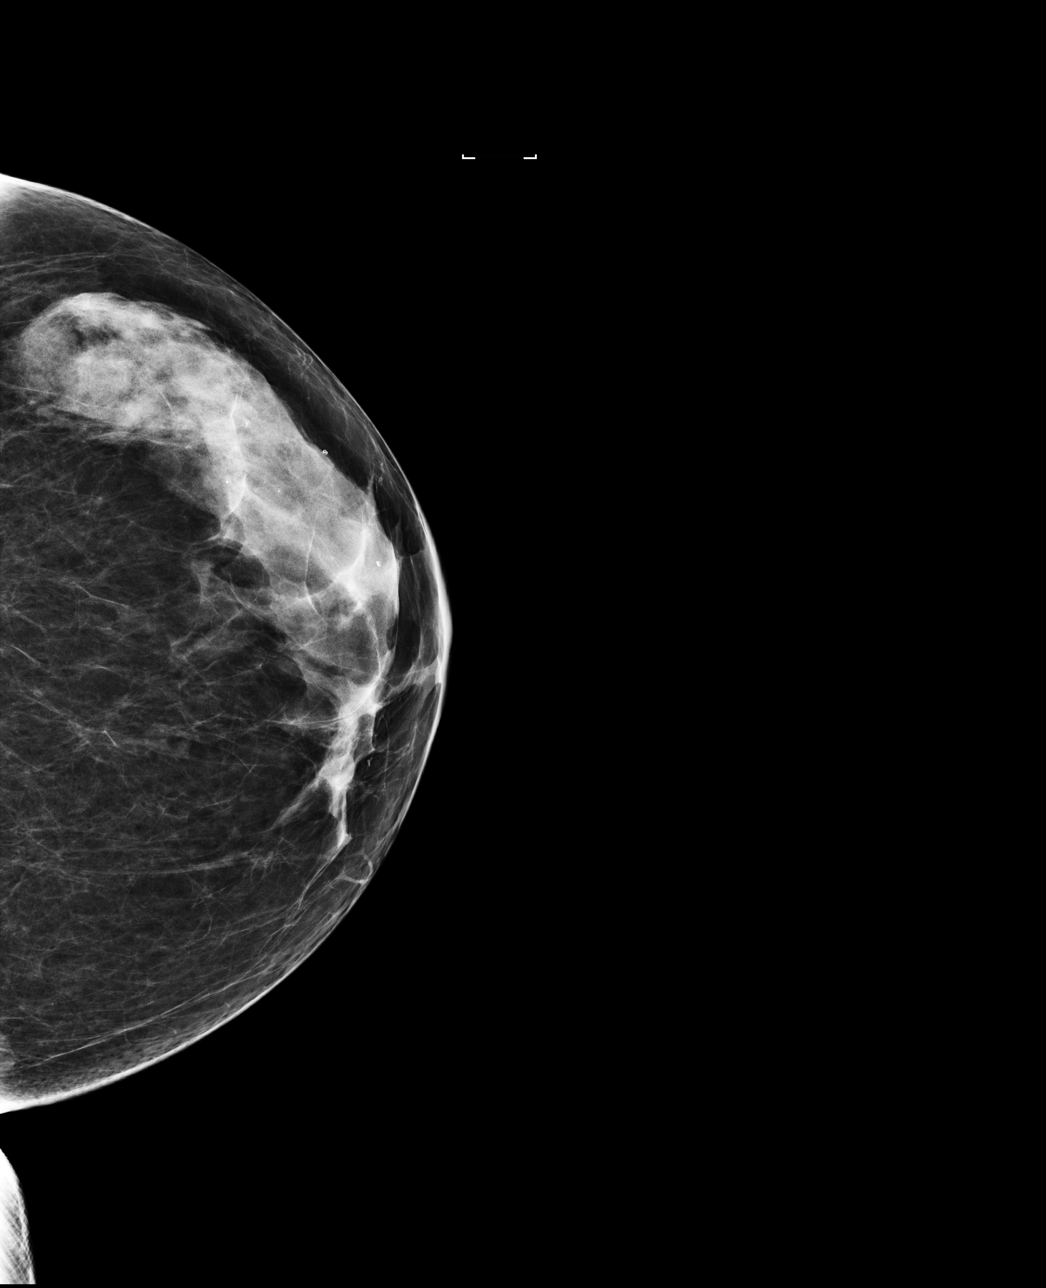

[4 of 4 positions shown; findings below may reference images not displayed]

ACR Breast Density Category c: The breast tissue is heterogeneously
dense, which may obscure small masses.
FINDINGS: There are no findings suspicious for malignancy. Images were
processed with CAD.
IMPRESSION: No mammographic evidence of malignancy. A result letter of this
screening mammogram will be mailed directly to the patient.

RECOMMENDATION:
Screening mammogram in one year. (Code:[id number])

BI-RADS CATEGORY  1: Negative.

## 2018-08-10 ENCOUNTER — Other Ambulatory Visit: Payer: Self-pay | Admitting: Internal Medicine

## 2018-08-10 MED ORDER — LOSARTAN POTASSIUM-HCTZ 100-25 MG PO TABS: 1.0000 | ORAL_TABLET | Freq: Every day | ORAL | 1 refills | Status: DC

## 2018-08-10 MED ORDER — OMEPRAZOLE 20 MG PO CPDR: DELAYED_RELEASE_CAPSULE | ORAL | 0 refills | Status: DC

## 2018-08-10 NOTE — Telephone Encounter (Signed)
LMTCB

## 2018-08-10 NOTE — Telephone Encounter (Signed)
Copied from [place] [id number]. Topic: Quick Communication - Rx Refill/Question >> Aug 10, 2018  9:35 AM Judyann Munson wrote: Medication: losartan-hydrochlorothiazide (HYZAAR) 100-25 MG tablet omeprazole (PRILOSEC) 20 MG capsule  Has the patient contacted their pharmacy? No   Preferred Pharmacy (with phone number or street name):   [place], [place] [phone] (Phone) [phone] (Fax)     Agent: Please be advised that RX refills may take up to 3 business days. We ask that you follow-up with your pharmacy.

## 2018-08-19 DIAGNOSIS — M5416 Radiculopathy, lumbar region: Secondary | ICD-10-CM | POA: Diagnosis not present

## 2018-08-26 DIAGNOSIS — M5416 Radiculopathy, lumbar region: Secondary | ICD-10-CM | POA: Diagnosis not present

## 2018-08-27 DIAGNOSIS — M4807 Spinal stenosis, lumbosacral region: Secondary | ICD-10-CM | POA: Diagnosis not present

## 2018-08-27 DIAGNOSIS — M5137 Other intervertebral disc degeneration, lumbosacral region: Secondary | ICD-10-CM | POA: Diagnosis not present

## 2018-08-27 DIAGNOSIS — M5126 Other intervertebral disc displacement, lumbar region: Secondary | ICD-10-CM | POA: Diagnosis not present

## 2018-09-02 DIAGNOSIS — M5416 Radiculopathy, lumbar region: Secondary | ICD-10-CM | POA: Diagnosis not present

## 2018-09-02 DIAGNOSIS — M4317 Spondylolisthesis, lumbosacral region: Secondary | ICD-10-CM | POA: Diagnosis not present

## 2018-09-24 ENCOUNTER — Other Ambulatory Visit: Payer: Self-pay | Admitting: Neurosurgery

## 2018-09-25 ENCOUNTER — Telehealth: Payer: Self-pay | Admitting: Vascular Surgery

## 2018-09-25 ENCOUNTER — Other Ambulatory Visit: Payer: Self-pay | Admitting: *Deleted

## 2018-09-25 NOTE — Telephone Encounter (Signed)
sch appt spk to pt mld ltr 10/28/2018 9am ALIF f/u MD

## 2018-09-25 NOTE — Telephone Encounter (Signed)
-----   Message from Willy Eddy, RN sent at 09/24/2018  5:12 PM EST ----- Regarding: OFFICE APPOINTMENT NEEDED PLEASE SCHEDULE OFFICE APPOINTMENT WITH  Dr. Scot Dock. ALIF scheduled for 11/10/18 with Dr. Annette Stable. Remind patient to bring any films, MRI et. Related to this surgery to this appointment . If she has any questions about this contact Lorriane Shire at Dr. Gearldine Bienenstock office. B

## 2018-10-07 ENCOUNTER — Other Ambulatory Visit (INDEPENDENT_AMBULATORY_CARE_PROVIDER_SITE_OTHER): Payer: Medicare Other

## 2018-10-07 DIAGNOSIS — E7800 Pure hypercholesterolemia, unspecified: Secondary | ICD-10-CM | POA: Diagnosis not present

## 2018-10-07 DIAGNOSIS — I10 Essential (primary) hypertension: Secondary | ICD-10-CM | POA: Diagnosis not present

## 2018-10-07 LAB — HEPATIC FUNCTION PANEL
ALT: 17 U/L (ref 0–35)
AST: 16 U/L (ref 0–37)
Albumin: 4.2 g/dL (ref 3.5–5.2)
Alkaline Phosphatase: 61 U/L (ref 39–117)
Bilirubin, Direct: 0.1 mg/dL (ref 0.0–0.3)
Total Bilirubin: 0.9 mg/dL (ref 0.2–1.2)
Total Protein: 7 g/dL (ref 6.0–8.3)

## 2018-10-07 LAB — LIPID PANEL
Cholesterol: 194 mg/dL (ref 0–200)
HDL: 49.2 mg/dL (ref >39.00)
LDL Cholesterol: 119 mg/dL — ABNORMAL HIGH (ref 0–99)
NonHDL: 144.59
Total CHOL/HDL Ratio: 4
Triglycerides: 128 mg/dL (ref 0.0–149.0)
VLDL: 25.6 mg/dL (ref 0.0–40.0)

## 2018-10-07 LAB — BASIC METABOLIC PANEL
BUN: 16 mg/dL (ref 6–23)
CO2: 29 mEq/L (ref 19–32)
Calcium: 9.6 mg/dL (ref 8.4–10.5)
Chloride: 103 mEq/L (ref 96–112)
Creatinine, Ser: 0.72 mg/dL (ref 0.40–1.20)
GFR: 84.76 mL/min (ref >60.00)
Glucose, Bld: 107 mg/dL — ABNORMAL HIGH (ref 70–99)
Potassium: 3.9 mEq/L (ref 3.5–5.1)
Sodium: 139 mEq/L (ref 135–145)

## 2018-10-08 ENCOUNTER — Encounter: Payer: Self-pay | Admitting: Internal Medicine

## 2018-10-08 ENCOUNTER — Ambulatory Visit (INDEPENDENT_AMBULATORY_CARE_PROVIDER_SITE_OTHER): Payer: Medicare Other | Admitting: Internal Medicine

## 2018-10-08 VITALS — BP 132/70 | HR 60 | Temp 97.7°F | Resp 16 | Ht 64.0 in | Wt 143.8 lb

## 2018-10-08 DIAGNOSIS — E7800 Pure hypercholesterolemia, unspecified: Secondary | ICD-10-CM | POA: Diagnosis not present

## 2018-10-08 DIAGNOSIS — R739 Hyperglycemia, unspecified: Secondary | ICD-10-CM | POA: Diagnosis not present

## 2018-10-08 DIAGNOSIS — Z0000 Encounter for general adult medical examination without abnormal findings: Secondary | ICD-10-CM

## 2018-10-08 DIAGNOSIS — R0609 Other forms of dyspnea: Secondary | ICD-10-CM | POA: Diagnosis not present

## 2018-10-08 DIAGNOSIS — Z1231 Encounter for screening mammogram for malignant neoplasm of breast: Secondary | ICD-10-CM

## 2018-10-08 DIAGNOSIS — R432 Parageusia: Secondary | ICD-10-CM | POA: Diagnosis not present

## 2018-10-08 DIAGNOSIS — M545 Low back pain, unspecified: Secondary | ICD-10-CM

## 2018-10-08 DIAGNOSIS — I6523 Occlusion and stenosis of bilateral carotid arteries: Secondary | ICD-10-CM | POA: Diagnosis not present

## 2018-10-08 DIAGNOSIS — I10 Essential (primary) hypertension: Secondary | ICD-10-CM | POA: Diagnosis not present

## 2018-10-08 DIAGNOSIS — R0600 Dyspnea, unspecified: Secondary | ICD-10-CM

## 2018-10-08 NOTE — Progress Notes (Signed)
Patient ID: DEB LOUDIN, female   DOB: Feb 22, 1946, 72 y.o.   MRN: [id number]   Subjective:    Patient ID: PEARLENE TEAT, female    DOB: 1946-04-26, 72 y.o.   MRN: [id number]  HPI  Patient with past history of CAD, hypercholesterolemia and hypertension.  She comes in today to follow up on these issues as well as for a complete physical exam.  Planning to have back surgery in January.  Has lost weight.  States she has been eating, but reports some decreased appetite.  Can't taste as well.  No sinus symptoms.  Denies acid reflux.  Discussed further w/up.  She declines.  No chest pain.  Breathing overall stable.  Saw cardiology 04/2018.  Had stress testing 02/2017 - low risk/without ischemia.  ECHO 02/2017 - normal LV function.  Continued to have DOE.  Had PFTs - mild restrictive lung disease.  6 minute walk test and cxr - normal.  Had holter that revealed rare PACs and a 3 beat run of atrial tachycardia with occasional PVCs.  Given her upcoming surgery, discussed f/u and pre op evaluation with cardiology.  She is sleeping better now.  Feels the tramadol was causing the change in her sleep. She now takes it in the morning and is sleeping better.  Discussed sleep study. She continues to decline, stating she is sleeping better.  Still with increased pain from her back.  No bowel or urine change.     Past Medical History:  Diagnosis Date  . Arthritis   . Carotid arterial disease ([place])    a. 11/2017 Carotid U/S: RICA 4-27%, LICA 06-23%.   . Chronic fatigue   . Chronic leg pain   . DDD (degenerative disc disease), lumbar   . Dyspnea on exertion    a. 02/2017 Echo: EF 60-65%, no rwma, mild MR. Nl RV size/fxn. Nl PASP; b. 02/2017 MV: small, mild, fixed basal inferolateral defect - ? artifact vs scar. No ischemia. EF >65%.  . Hypercholesterolemia   . Hypertension   . Insomnia   . Palpitations    a. 02/2017 Holter: Avg HR 66 (51-115), rare PACs, four brief runs of PAT - up to 5 beats, max rate 157. Rare  isolated PVCs. No sustained arrhythmias; b. 03/2018 24h Holter: Rare PACs, 3 beat run PAT (122 bpm), PVCs (2% of beats).  . Radiculopathy    Lumbar region  . Sinus bradycardia   . Spondylolisthesis    lumbosacral region   Past Surgical History:  Procedure Laterality Date  . ABDOMINAL HYSTERECTOMY  1975  . ANTERIOR CERVICAL DECOMP/DISCECTOMY FUSION N/A 04/09/2013   Procedure: ANTERIOR CERVICAL DECOMPRESSION/DISCECTOMY FUSION 2 LEVELS;  Surgeon: Floyce Stakes, MD;  Location: [facility];  Service: Neurosurgery;  Laterality: N/A;  Cervical five-six Cervical six-seven  Anterior cervical decompression/diskectomy/fusion  . BACK SURGERY     ruptured disc  . RECONSTRUCTION OF NOSE     Family History  Problem Relation Age of Onset  . Cancer Mother        ovarian/uterine  . Heart disease Mother   . Heart attack Mother   . Stroke Mother   . Colon cancer Father   . Dementia Father   . COPD Brother   . Congestive Heart Failure Brother   . Stroke Daughter   . Heart attack Daughter   . Breast cancer Neg Hx    Social History   Socioeconomic History  . Marital status: Married    Spouse name: Not on file  .  Number of children: 2  . Years of education: Not on file  . Highest education level: Not on file  Occupational History  . Not on file  Social Needs  . Financial resource strain: Not hard at all  . Food insecurity:    Worry: Never true    Inability: Never true  . Transportation needs:    Medical: No    Non-medical: No  Tobacco Use  . Smoking status: Never Smoker  . Smokeless tobacco: Never Used  Substance and Sexual Activity  . Alcohol use: Yes    Alcohol/week: 0.0 standard drinks    Comment: rarely  . Drug use: No  . Sexual activity: Yes  Lifestyle  . Physical activity:    Days per week: Not on file    Minutes per session: Not on file  . Stress: Not on file  Relationships  . Social connections:    Talks on phone: Not on file    Gets together: Not on file     Attends religious service: Not on file    Active member of club or organization: Not on file    Attends meetings of clubs or organizations: Not on file    Relationship status: Not on file  Other Topics Concern  . Not on file  Social History Narrative  . Not on file    Outpatient Encounter Medications as of 10/08/2018  Medication Sig  . amLODipine (NORVASC) 2.5 MG tablet Take 1 tablet (2.5 mg total) by mouth daily.  Marland Kitchen aspirin EC 81 MG tablet Take 81 mg by mouth daily.  . Cyanocobalamin (VITAMIN B 12) 100 MCG LOZG Take by mouth daily.  Marland Kitchen losartan-hydrochlorothiazide (HYZAAR) 100-25 MG tablet Take 1 tablet by mouth daily.  . Multiple Vitamins-Minerals (CENTRUM PO) Take 1 tablet by mouth daily.   Marland Kitchen omeprazole (PRILOSEC) 20 MG capsule TAKE (1) CAPSULE BY MOUTH TWICE DAILY FOR REFLUX  . pravastatin (PRAVACHOL) 10 MG tablet Take 1 tablet (10 mg total) by mouth daily.  . traMADol (ULTRAM) 50 MG tablet Take 1 tablet (50 mg total) by mouth daily.   No facility-administered encounter medications on file as of 10/08/2018.     Review of Systems  Constitutional:       Decreased taste.  Has lost weight.  Decreased appetite.    HENT: Negative for congestion, sinus pressure and sore throat.   Eyes: Negative for pain and visual disturbance.  Respiratory: Negative for cough and chest tightness.        Breathing stable.    Cardiovascular: Negative for chest pain, palpitations and leg swelling.  Gastrointestinal: Negative for abdominal pain, diarrhea, nausea and vomiting.  Genitourinary: Negative for difficulty urinating and dysuria.  Musculoskeletal: Positive for back pain. Negative for joint swelling and myalgias.  Skin: Negative for color change and rash.  Neurological: Negative for dizziness, light-headedness and headaches.  Hematological: Negative for adenopathy. Does not bruise/bleed easily.  Psychiatric/Behavioral: Negative for agitation and dysphoric mood.       Objective:    Physical  Exam Constitutional:      General: She is not in acute distress.    Appearance: Normal appearance. She is well-developed.  HENT:     Nose: Nose normal. No congestion.     Mouth/Throat:     Pharynx: No oropharyngeal exudate or posterior oropharyngeal erythema.  Eyes:     General: No scleral icterus.       Right eye: No discharge.        Left eye: No discharge.  Neck:     Musculoskeletal: Neck supple. No muscular tenderness.     Thyroid: No thyromegaly.  Cardiovascular:     Rate and Rhythm: Normal rate and regular rhythm.  Pulmonary:     Effort: No tachypnea, accessory muscle usage or respiratory distress.     Breath sounds: Normal breath sounds. No decreased breath sounds or wheezing.  Chest:     Breasts:        Right: No inverted nipple, mass, nipple discharge or tenderness (no axillary adenopathy).        Left: No inverted nipple, mass, nipple discharge or tenderness (no axilarry adenopathy).  Abdominal:     General: Bowel sounds are normal.     Palpations: Abdomen is soft.     Tenderness: There is no abdominal tenderness.  Musculoskeletal:        General: No swelling or tenderness.  Lymphadenopathy:     Cervical: No cervical adenopathy.  Skin:    Findings: No erythema or rash.  Neurological:     Mental Status: She is alert and oriented to person, place, and time.  Psychiatric:        Mood and Affect: Mood normal.        Behavior: Behavior normal.     BP 132/70 (BP Location: Left Arm, Patient Position: Sitting, Cuff Size: Normal)   Pulse 60   Temp 97.7 F (36.5 C) (Oral)   Resp 16   Ht '5\' 4"'  (1.626 m)   Wt 143 lb 12.8 oz (65.2 kg)   SpO2 96%   BMI 24.68 kg/m  Wt Readings from Last 3 Encounters:  10/08/18 143 lb 12.8 oz (65.2 kg)  05/27/18 151 lb 6.4 oz (68.7 kg)  04/22/18 148 lb 12 oz (67.5 kg)     Lab Results  Component Value Date   WBC 5.5 05/25/2018   HGB 13.3 05/25/2018   HCT 39.4 05/25/2018   PLT 254.0 05/25/2018   GLUCOSE 107 (H) 10/07/2018    CHOL 194 10/07/2018   TRIG 128.0 10/07/2018   HDL 49.20 10/07/2018   LDLDIRECT 151.7 08/16/2013   LDLCALC 119 (H) 10/07/2018   ALT 17 10/07/2018   AST 16 10/07/2018   NA 139 10/07/2018   K 3.9 10/07/2018   CL 103 10/07/2018   CREATININE 0.72 10/07/2018   BUN 16 10/07/2018   CO2 29 10/07/2018   TSH 3.71 05/25/2018    Dexascan  Result Date: 04/29/2018 EXAM: DUAL X-RAY ABSORPTIOMETRY (DXA) FOR BONE MINERAL DENSITY IMPRESSION: Dear Dr Einar Pheasant, Your patient Ashlyn Cabler completed a BMD test on 04/29/2018 using the [place] (analysis version: 14.10) manufactured by [employer]. The following summarizes the results of our evaluation. PATIENT BIOGRAPHICAL: Name: Brailynn, Breth Patient ID: [id number] Birth Date: 10-16-1946 Height: 63.0 in. Gender: Female Exam Date: 04/29/2018 Weight: 149.2 lbs. Indications: Advanced Age, Caucasian, Height Loss, Hysterectomy, Osteoarthritis, POSTmenopausal Fractures: Treatments: 81 MG ASPIRIN, multivitamin, patch ASSESSMENT: The BMD measured at Femur Neck Left is 1.018 g/cm2 with a T-score of -0.1. This patient is considered normal according to [facility]) criteria. Site Region Measured Measured WHO Young Adult BMD Date       Age      Classification T-score AP Spine L1-L4 04/29/[age] 71.0 Normal 1.2 1.329 g/cm2 DualFemur Neck Left 04/29/2018 71.0 Normal -0.1 1.018 g/cm2 Left Forearm Radius 33% 04/29/2018 71.0 Normal 0.1 0.882 g/cm2 World Health Organization [facility]) criteria for post-menopausal, Caucasian Women: Normal:       T-score at or above -  1 SD Osteopenia:   T-score between -1 and -2.5 SD Osteoporosis: T-score at or below -2.5 SD RECOMMENDATIONS: 1. All patients should optimize calcium and vitamin D intake. 2. Consider FDA-approved medical therapies in postmenopausal women and men aged 57 years and older, based on the following: a. A hip or vertebral (clinical or morphometric) fracture b. T-score < -2.5 at the femoral  neck or spine after appropriate evaluation to exclude secondary causes c. Low bone mass (T-score between -1.0 and -2.5 at the femoral neck or spine) and a 10-year probability of a hip fracture > 3% or a 10-year probability of a major osteoporosis-related fracture > 20% based on the [place]-adapted WHO algorithm d. Clinician judgment and/or patient preferences may indicate treatment for people with 10-year fracture probabilities above or below these levels FOLLOW-UP: Patients with diagnosed cases of osteoporosis or at high risk for fracture should have regular bone mineral density tests. For patients eligible for Medicare, routine testing is allowed once every 2 years. The testing frequency can be increased to one year for patients who have rapidly progressing disease, those who are receiving or discontinuing medical therapy to restore bone mass, or have additional risk factors. I have reviewed this report, and agree with the above findings. [facility] Radiology Electronically Signed   By: Lowella Grip III M.D.   On: 04/29/2018 14:45       Assessment & Plan:   Problem List Items Addressed This Visit    Back pain    Seeing neurosurgery.  Planning for surgery in January.  Taking tramadol. Taking in am now and sleeping better.  Refer to cardiology for pre op evaluation.        Carotid stenosis    Evaluated by Dr Delana Meyer 11/2017.  Recommended f/u in one year.        Decreased sense of taste    Discussed with her today.  She declines any further testing or evaluation.  Follow.  Contributing to her weight loss.  Follow.        Dyspnea on exertion    Has seen pulmonary and cardiology.  Work up as outlined.  Overall feels breathing is stable.  Have cardiology evaluate (for pre op evaluation) prior to her surgery.        Relevant Orders   Ambulatory referral to Cardiology   Health care maintenance    Physical today 10/08/18.  Mammogram 09/25/17 - Bir[facility] I.  Schedule f/u mammogram.  Need results of  colonoscopy.        Hypercholesteremia    On pravastatin.  Low cholesterol diet and exercise.  Follow lipid panel and liver function tests.        Relevant Orders   Hepatic function panel   Lipid panel   Hypertension    Blood pressure as outlined.  Follow pressures.  Follow met b.  Same medication.        Relevant Orders   Basic metabolic panel    Other Visit Diagnoses    Visit for screening mammogram    -  Primary   Relevant Orders   MM 3D SCREEN BREAST BILATERAL   Hyperglycemia       Relevant Orders   Hemoglobin A1c       Einar Pheasant, MD

## 2018-10-12 ENCOUNTER — Encounter: Payer: Self-pay | Admitting: Internal Medicine

## 2018-10-12 DIAGNOSIS — R432 Parageusia: Secondary | ICD-10-CM | POA: Insufficient documentation

## 2018-10-12 NOTE — Assessment & Plan Note (Signed)
Evaluated by Dr Delana Meyer 11/2017.  Recommended f/u in one year.

## 2018-10-12 NOTE — Assessment & Plan Note (Signed)
Physical today 10/08/18.  Mammogram 09/25/17 - Birads I.  Schedule f/u mammogram.  Need results of colonoscopy.

## 2018-10-12 NOTE — Assessment & Plan Note (Signed)
Seeing neurosurgery.  Planning for surgery in January.  Taking tramadol. Taking in am now and sleeping better.  Refer to cardiology for pre op evaluation.

## 2018-10-12 NOTE — Assessment & Plan Note (Signed)
On pravastatin.  Low cholesterol diet and exercise.  Follow lipid panel and liver function tests.   

## 2018-10-12 NOTE — Assessment & Plan Note (Signed)
Discussed with her today.  She declines any further testing or evaluation.  Follow.  Contributing to her weight loss.  Follow.

## 2018-10-12 NOTE — Assessment & Plan Note (Signed)
Has seen pulmonary and cardiology.  Work up as outlined.  Overall feels breathing is stable.  Have cardiology evaluate (for pre op evaluation) prior to her surgery.

## 2018-10-12 NOTE — Assessment & Plan Note (Signed)
Blood pressure as outlined.  Follow pressures.  Follow met b.  Same medication.

## 2018-10-20 IMAGING — CR DG CHEST 2V
1 series · 2 of 2 positions shown · non-contrast
Comparison: None.

CLINICAL DATA: Dyspnea on exertion

EXAM:
CHEST - 2 VIEW

[Series 1: dg chest 2 view · 0.14mm/px · 2 of 2 slices shown]
[im 1/2]
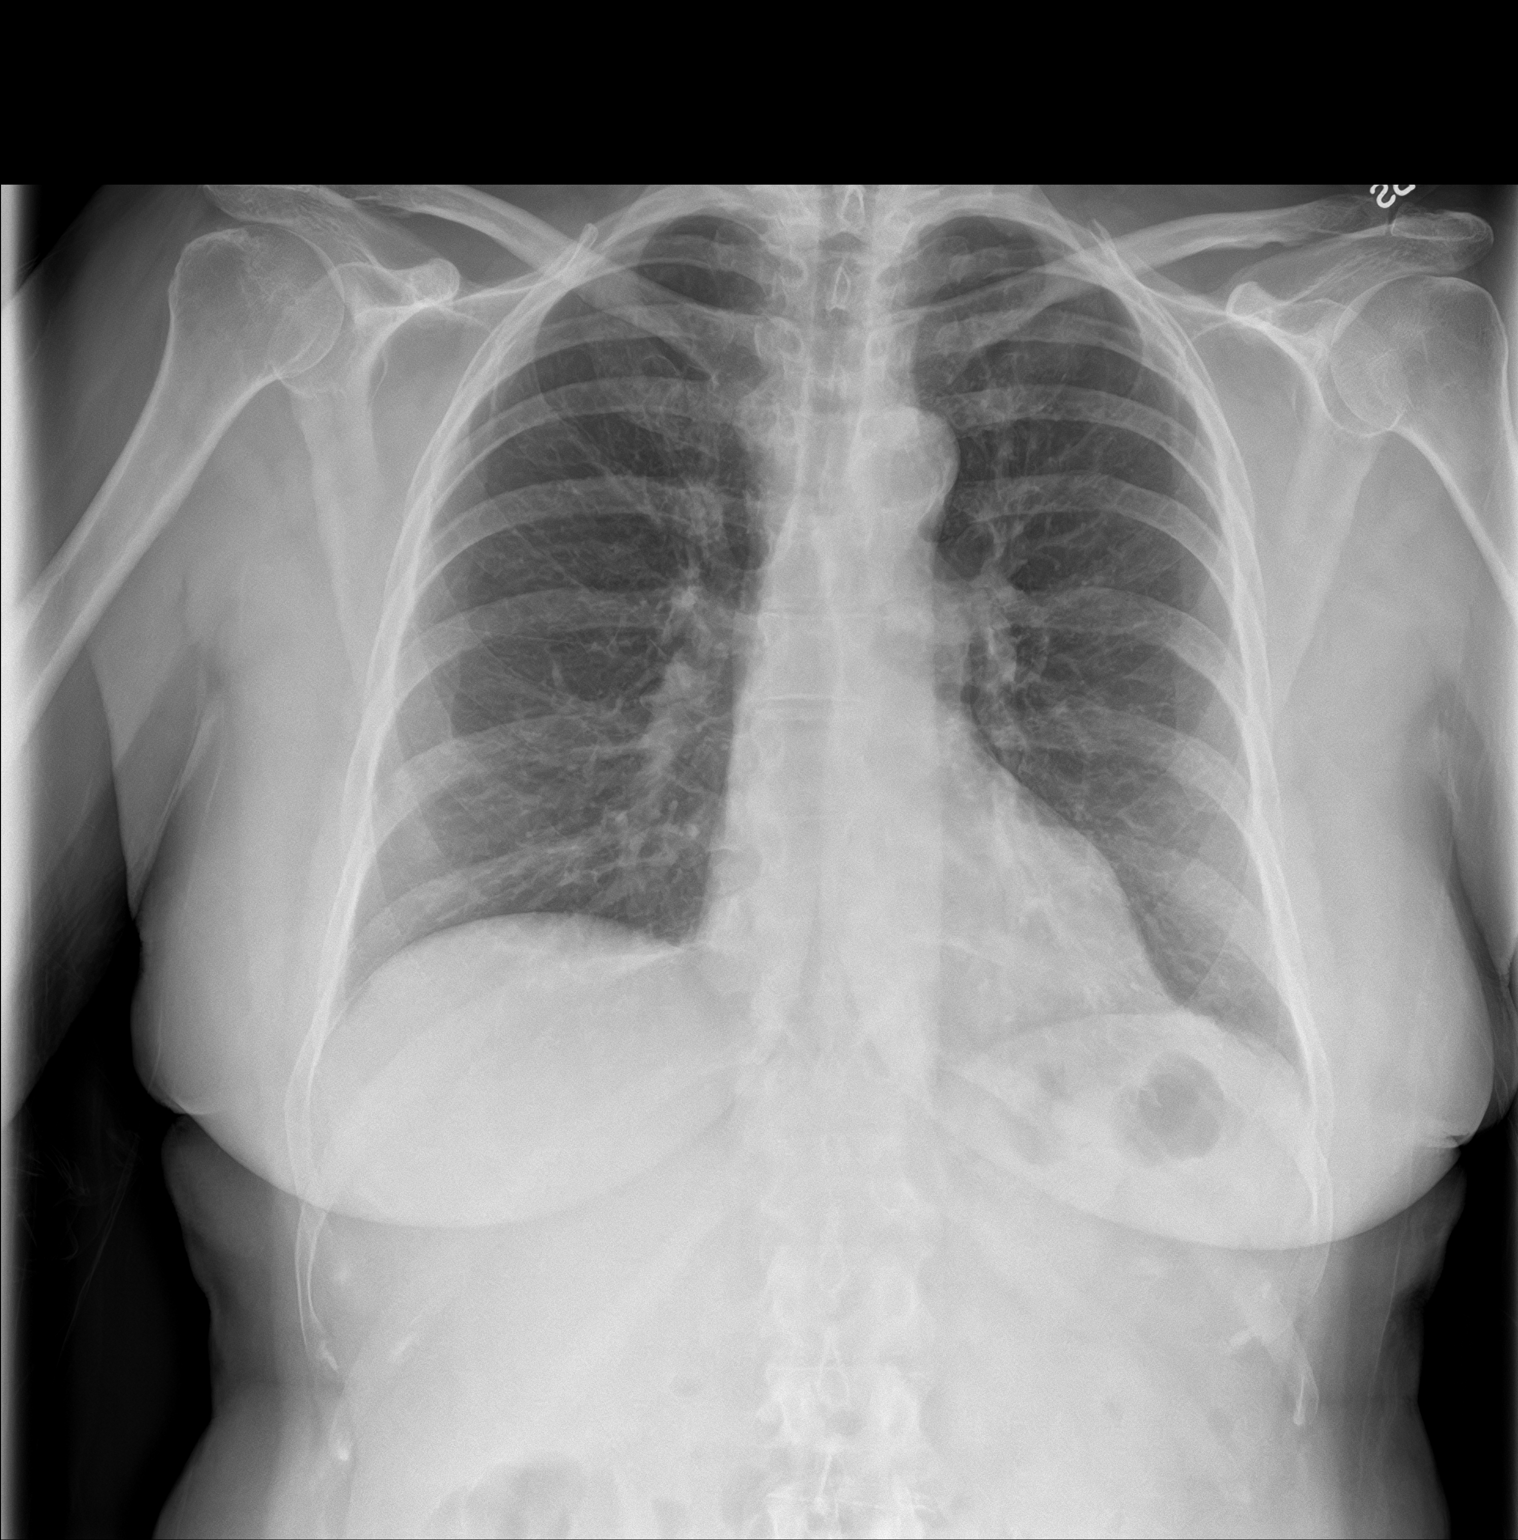
[im 2/2]
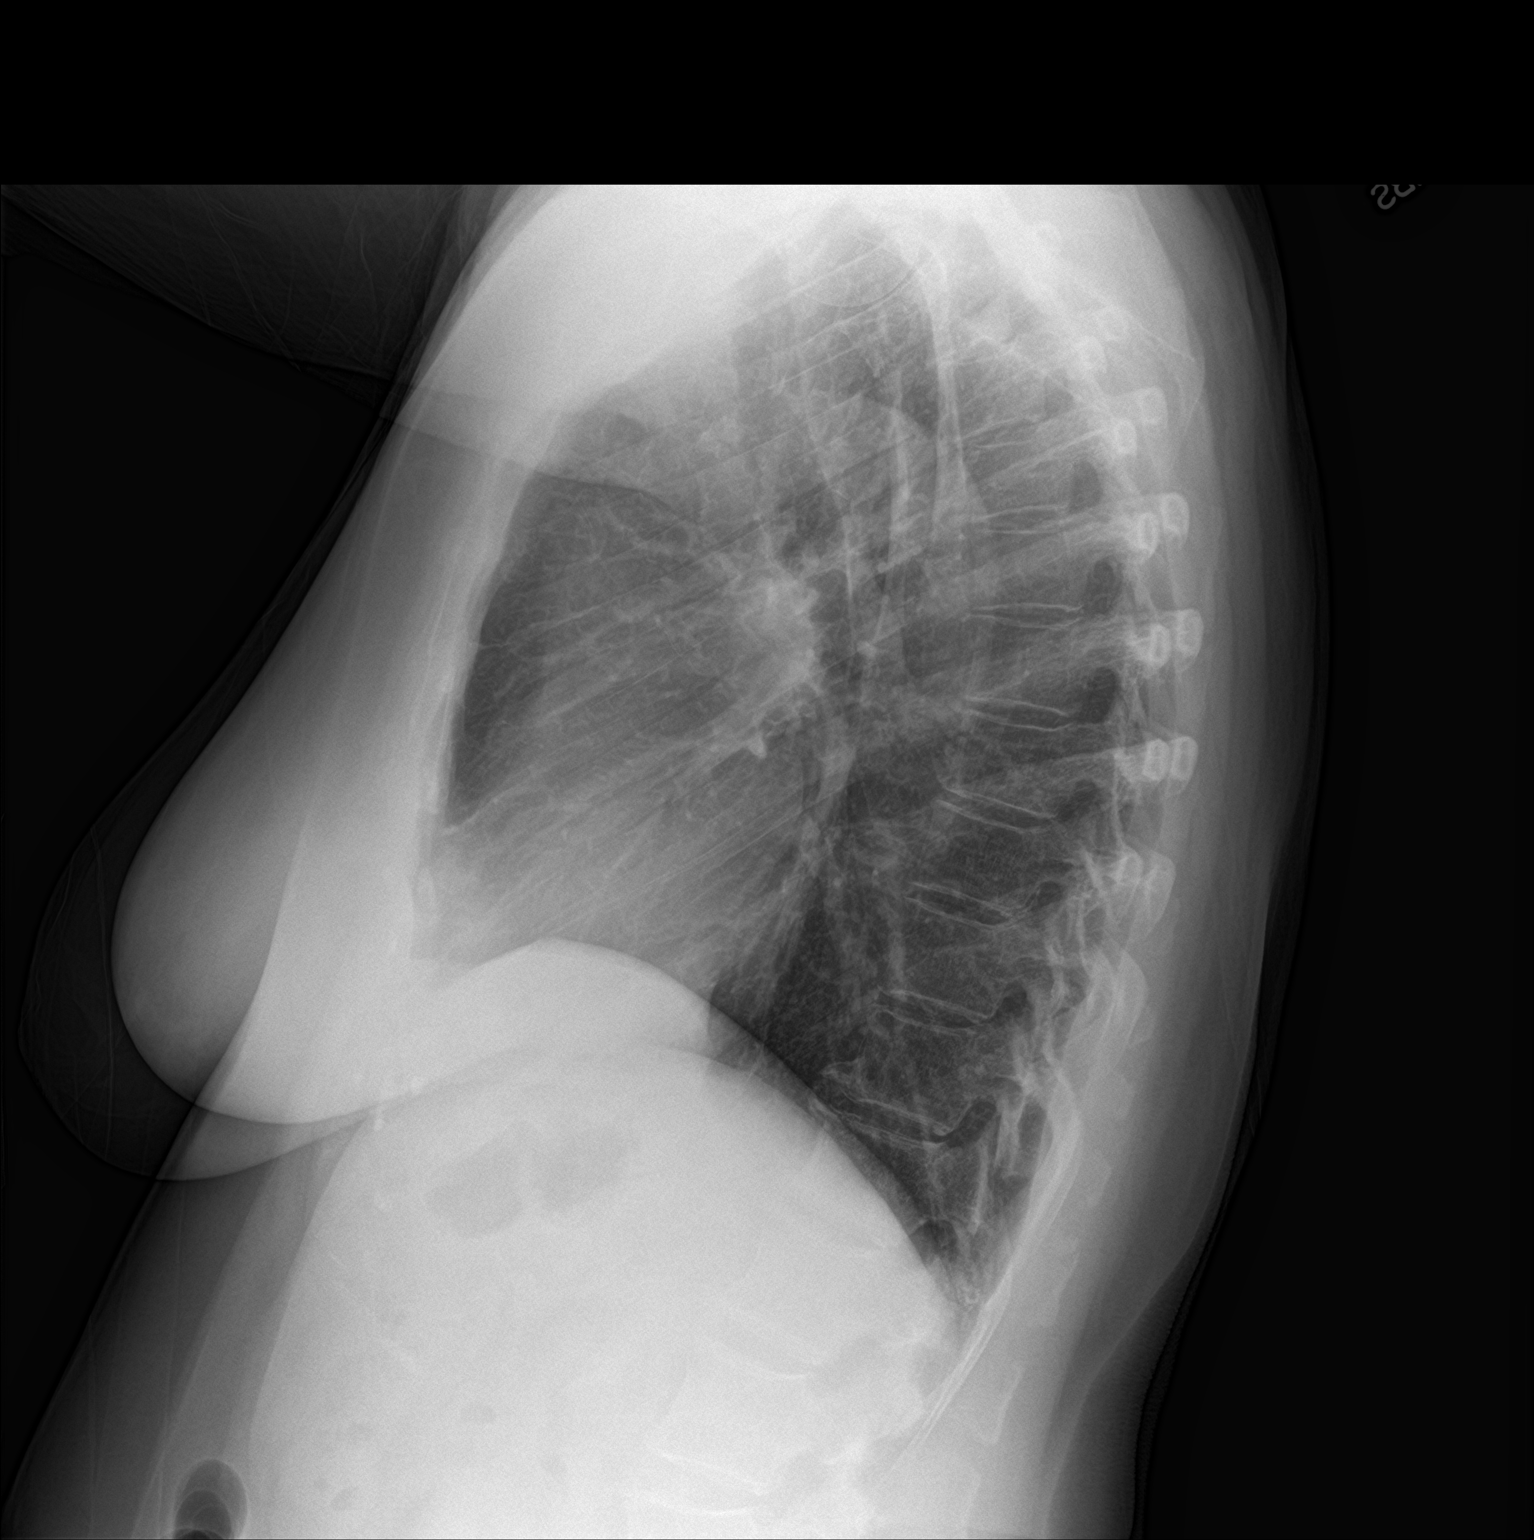

[2 of 2 positions shown; findings below may reference images not displayed]

FINDINGS: Normal mediastinum and cardiac silhouette. Normal pulmonary
vasculature. No evidence of effusion, infiltrate, or pneumothorax.
No acute bony abnormality. Anterior cervical fusion
IMPRESSION: No acute cardiopulmonary process.

## 2018-10-28 ENCOUNTER — Ambulatory Visit (INDEPENDENT_AMBULATORY_CARE_PROVIDER_SITE_OTHER): Payer: Medicare Other | Admitting: Vascular Surgery

## 2018-10-28 ENCOUNTER — Encounter (HOSPITAL_COMMUNITY)
Admission: RE | Admit: 2018-10-28 | Discharge: 2018-10-28 | Disposition: A | Discharge status: Home / Self Care | Payer: Medicare Other | Source: Ambulatory Visit | Attending: Neurosurgery | Admitting: Neurosurgery

## 2018-10-28 ENCOUNTER — Ambulatory Visit: Payer: Medicare Other

## 2018-10-28 ENCOUNTER — Ambulatory Visit: Payer: Medicare Other | Admitting: Vascular Surgery

## 2018-10-28 ENCOUNTER — Encounter: Payer: Self-pay | Admitting: Vascular Surgery

## 2018-10-28 ENCOUNTER — Encounter (HOSPITAL_COMMUNITY): Payer: Self-pay

## 2018-10-28 VITALS — BP 198/80 | HR 66 | Resp 18 | Ht 64.0 in | Wt 143.0 lb

## 2018-10-28 DIAGNOSIS — M5136 Other intervertebral disc degeneration, lumbar region: Secondary | ICD-10-CM | POA: Diagnosis not present

## 2018-10-28 DIAGNOSIS — Z01812 Encounter for preprocedural laboratory examination: Secondary | ICD-10-CM | POA: Insufficient documentation

## 2018-10-28 DIAGNOSIS — M5137 Other intervertebral disc degeneration, lumbosacral region: Secondary | ICD-10-CM

## 2018-10-28 LAB — BASIC METABOLIC PANEL
Anion gap: 7 (ref 5–15)
BUN: 11 mg/dL (ref 8–23)
CO2: 27 mmol/L (ref 22–32)
Calcium: 9.6 mg/dL (ref 8.9–10.3)
Chloride: 105 mmol/L (ref 98–111)
Creatinine, Ser: 0.81 mg/dL (ref 0.44–1.00)
GFR calc Af Amer: >60 mL/min (ref >60)
GFR calc non Af Amer: >60 mL/min (ref >60)
Glucose, Bld: 155 mg/dL — ABNORMAL HIGH (ref 70–99)
Potassium: 3.5 mmol/L (ref 3.5–5.1)
Sodium: 139 mmol/L (ref 135–145)

## 2018-10-28 LAB — CBC WITH DIFFERENTIAL/PLATELET
Abs Immature Granulocytes: 0.02 10*3/uL (ref 0.00–0.07)
Basophils Absolute: 0.1 10*3/uL (ref 0.0–0.1)
Basophils Relative: 1 %
Eosinophils Absolute: 0.1 10*3/uL (ref 0.0–0.5)
Eosinophils Relative: 2 %
HCT: 39.6 % (ref 36.0–46.0)
Hemoglobin: 12.8 g/dL (ref 12.0–15.0)
Immature Granulocytes: 0 %
Lymphocytes Relative: 25 %
Lymphs Abs: 1.4 10*3/uL (ref 0.7–4.0)
MCH: 29.4 pg (ref 26.0–34.0)
MCHC: 32.3 g/dL (ref 30.0–36.0)
MCV: 90.8 fL (ref 80.0–100.0)
Monocytes Absolute: 0.3 10*3/uL (ref 0.1–1.0)
Monocytes Relative: 6 %
Neutro Abs: 3.8 10*3/uL (ref 1.7–7.7)
Neutrophils Relative %: 66 %
Platelets: 263 10*3/uL (ref 150–400)
RBC: 4.36 MIL/uL (ref 3.87–5.11)
RDW: 12.8 % (ref 11.5–15.5)
WBC: 5.7 10*3/uL (ref 4.0–10.5)
nRBC: 0 % (ref 0.0–0.2)

## 2018-10-28 LAB — ABO/RH: ABO/RH(D): A POS

## 2018-10-28 LAB — TYPE AND SCREEN
ABO/RH(D): A POS
Antibody Screen: NEGATIVE

## 2018-10-28 MED ORDER — CHLORHEXIDINE GLUCONATE CLOTH 2 % EX PADS: 6.0000 | MEDICATED_PAD | Freq: Once | CUTANEOUS | Status: DC

## 2018-10-28 NOTE — Pre-Procedure Instructions (Signed)
Kristen Strickland  10/28/2018      [place], [place] - [address] [place] [address] Phone: [phone] Fax: [phone]    Your procedure is scheduled on 11/10/18.  Report to [facility] Admitting at 530 A.M.  Call this number if you have problems the morning of surgery:  [phone]   Remember:  Do not eat or drink after midnight.  Y    Take these medicines the morning of surgery with A SIP OF WATER ---norvasc,prilosec,ultram    Do not wear jewelry, make-up or nail polish.  Do not wear lotions, powders, or perfumes, or deodorant.  Do not shave 48 hours prior to surgery.  Men may shave face and neck.  Do not bring valuables to the hospital.  [facility] is not responsible for any belongings or valuables.  Contacts, dentures or bridgework may not be worn into surgery.  Leave your suitcase in the car.  After surgery it may be brought to your room.  For patients admitted to the hospital, discharge time will be determined by your treatment team.  Patients discharged the day of surgery will not be allowed to drive home.   Name and phone number of your driver:   Do not take any aspirin,anti-inflammatories,vitamins,or herbal supplements 5-7 days prior to surgery. Special instructions:  [facility] - Preparing for Surgery  Before surgery, you can play an important role.  Because skin is not sterile, your skin needs to be as free of germs as possible.  You can reduce the number of germs on you skin by washing with CHG (chlorahexidine gluconate) soap before surgery.  CHG is an antiseptic cleaner which kills germs and bonds with the skin to continue killing germs even after washing.  Oral Hygiene is also important in reducing the risk of infection.  Remember to brush your teeth with your regular toothpaste the morning of surgery.  Please DO NOT use if you have an allergy to CHG or antibacterial soaps.  If your skin  becomes reddened/irritated stop using the CHG and inform your nurse when you arrive at Short Stay.  Do not shave (including legs and underarms) for at least 48 hours prior to the first CHG shower.  You may shave your face.  Please follow these instructions carefully:   1.  Shower with CHG Soap the night before surgery and the morning of Surgery.  2.  If you choose to wash your hair, wash your hair first as usual with your normal shampoo.  3.  After you shampoo, rinse your hair and body thoroughly to remove the shampoo. 4.  Use CHG as you would any other liquid soap.  You can apply chg directly to the skin and wash gently with a      scrungie or washcloth.           5.  Apply the CHG Soap to your body ONLY FROM THE NECK DOWN.   Do not use on open wounds or open sores. Avoid contact with your eyes, ears, mouth and genitals (private parts).  Wash genitals (private parts) with your normal soap.  6.  Wash thoroughly, paying special attention to the area where your surgery will be performed.  7.  Thoroughly rinse your body with warm water from the neck down.  8.  DO NOT shower/wash with your normal soap after using and rinsing off the CHG Soap.  9.  Pat yourself dry with a  clean towel.            10.  Wear clean pajamas.            11.  Place clean sheets on your bed the night of your first shower and do not sleep with pets.  Day of Surgery  Do not apply any lotions/deoderants the morning of surgery.   Please wear clean clothes to the hospital/surgery center. Remember to brush your teeth with toothpaste.    Please read over the following fact sheets that you were given. MRSA Information

## 2018-10-28 NOTE — Progress Notes (Signed)
Cardiology Office Note Date:  10/29/2018  Patient ID:  Kristen, Strickland 03-Feb-1946, MRN [id number] PCP:  Einar Pheasant, MD  Cardiologist:  Dr. Saunders Revel, MD    Chief Complaint: Follow up  History of Present Illness: Kristen Strickland is a 73 y.o. female with history of carotid artery stenosis, HTN, HLD, and degenerative disk disease with chronic leg pain who presents for follow up of palpitations and fatigue.  She was previously followed by Dr. Yvone Neu, though more recently she has been followed by Dr, End. Exercise Myoview on 02/24/2017 was low risk without ischemia. Small in size, mild in severity, fixed basal inferolateral defect that may reflect artifact or subtle scar, LVEF > 65%. TTE 03/06/2017 showed an EF of 60-65%, normal wall motion, normal diastolic function, mild mitral regurgitation, normal RV size and systolic function, PASP normal. Holter monitor in 02/2017 showed predominant rhythm of sinus with an average heart rate of 66 bpm (range 51-115 bpm), longest R-R interval of 1.3 seconds, rare PACs including atrial couplets and episodes of bigeminy, four atrial runs lasting up to 5 beats with a maximum rate of 157 bpm, rare isolated PVCs, no sustained arrhythmias or prolonged pauses were noted. She was seen in 02/2017 for palpitations and was placed on Coreg with resoluation of palpitations. She was seen on 06/11/2017 and continued to note exertional dyspnea. In follow up on 12/18/2017, she was doing well. She did continue to note intermittent exertional dyspnea with modest activity that was stable. She noted improvement in palpitations following the initiation of Coreg. Her BP was noted to be 142/72, she had been out of her amlodipine. Her heart rate was noted to be 51 bpm, and her Coreg was held. She was resumed on amlodipine. She was continued on Hyzaar. In telephone follow up on 3/6, with complaints of orthostasis. Her amlodipine was decreased to 2.5 mg daily and she was advised to bring her cuff in  for comparison. She was seen by pulmonology for evaluation of her SOB with PFT showing mild restrictive lung disease, 6-minute walk test, and CXR were unrevealing. She was noted to ambulate at a very fast pace and was advised to pace herself more. She was offered an inhaler, though declined. In follow up in 02/2018 she continued to note intermittent fatigue and nocturnal palpitations. Holter monitor showed NSR with rare PACs, a 3 beat run of atrial tachycardia and occasional PVCs accounting for 2% of total beats.  She was last seen in the office and 04/2018 and was relatively stable.  She continued to note easy fatigability and nap for about 3 hours each afternoon.  It was noted her sleep is quite disrupted secondary to her low back pain, restless leg, and calf cramping.  Recent blood work including CBC, BMP, and TSH were unrevealing.  Sleep study was offered though the patient declined.  She comes in doing well from a cardiac perspective.  She continues to note worsening low back pain with radiculopathy.  In this setting, she is scheduled for surgery on 11/10/2018.  With the increase in her low back pain, her blood pressure has been running on the high side.  She was seen by vascular surgery on 10/28/2018 for preoperative evaluation of anterior retroperitoneal exposure of L5-S1 with blood pressure noted to be 207/79 trending to 198/80.  Patient has indicated she had not yet taken her antihypertensive medication and was worried she was going to be late for that appointment given traffic.  She has not been checking her  blood pressures at home recently so was unable to provide [place] with recent readings.  She does indicate she has been compliant with medications.  She denies any chest pain, dyspnea on exertion, palpitations, dizziness, presyncope, or syncope.  No lower extremity swelling, abdominal distention, PND, orthopnea, or early satiety.  Her main limiting factor with regards to her functional status is her low back  pain as she states she cannot stand for extended time spans.  She is able to achieve greater than 4 METS at baseline.  She continues to note stable fatigue and is hoping with improved back pain she will sleep better which will in turn improve her fatigue.  She continues to be uninterested and a sleep study at this time.  Past Medical History:  Diagnosis Date  . Arthritis   . Carotid arterial disease ([place])    a. 11/2017 Carotid U/S: RICA 7-86%, LICA 76-72%.   . Chronic fatigue   . Chronic leg pain   . DDD (degenerative disc disease), lumbar   . Dyspnea on exertion    a. 02/2017 Echo: EF 60-65%, no rwma, mild MR. Nl RV size/fxn. Nl PASP; b. 02/2017 MV: small, mild, fixed basal inferolateral defect - ? artifact vs scar. No ischemia. EF >65%.  . Hypercholesterolemia   . Hypertension   . Insomnia   . Palpitations    a. 02/2017 Holter: Avg HR 66 (51-115), rare PACs, four brief runs of PAT - up to 5 beats, max rate 157. Rare isolated PVCs. No sustained arrhythmias; b. 03/2018 24h Holter: Rare PACs, 3 beat run PAT (122 bpm), PVCs (2% of beats).  . Radiculopathy    Lumbar region  . Sinus bradycardia   . Spondylolisthesis    lumbosacral region    Past Surgical History:  Procedure Laterality Date  . ABDOMINAL HYSTERECTOMY  1975  . ANTERIOR CERVICAL DECOMP/DISCECTOMY FUSION N/A 04/09/2013   Procedure: ANTERIOR CERVICAL DECOMPRESSION/DISCECTOMY FUSION 2 LEVELS;  Surgeon: Floyce Stakes, MD;  Location: [facility];  Service: Neurosurgery;  Laterality: N/A;  Cervical five-six Cervical six-seven  Anterior cervical decompression/diskectomy/fusion  . BACK SURGERY     ruptured disc  . RECONSTRUCTION OF NOSE      Current Meds  Medication Sig  . amLODipine (NORVASC) 5 MG tablet Take 1 tablet (5 mg total) by mouth daily.  Marland Kitchen aspirin EC 81 MG tablet Take 81 mg by mouth daily.  Marland Kitchen ibuprofen (ADVIL,MOTRIN) 200 MG tablet Take 200 mg by mouth at bedtime.  Marland Kitchen losartan-hydrochlorothiazide (HYZAAR) 100-25 MG  tablet Take 1 tablet by mouth daily.  . Multiple Vitamins-Minerals (CENTRUM PO) Take 1 tablet by mouth daily.   Marland Kitchen omeprazole (PRILOSEC) 20 MG capsule TAKE (1) CAPSULE BY MOUTH TWICE DAILY FOR REFLUX (Patient taking differently: Take 20 mg by mouth daily. )  . pravastatin (PRAVACHOL) 10 MG tablet Take 1 tablet (10 mg total) by mouth daily.  . traMADol (ULTRAM) 50 MG tablet Take 1 tablet (50 mg total) by mouth daily.  . [DISCONTINUED] amLODipine (NORVASC) 2.5 MG tablet Take 2.5 mg by mouth daily.  . [DISCONTINUED] losartan-hydrochlorothiazide (HYZAAR) 100-25 MG tablet Take 1 tablet by mouth daily.  . [DISCONTINUED] pravastatin (PRAVACHOL) 10 MG tablet Take 1 tablet (10 mg total) by mouth daily.    Allergies:   Patient has no known allergies.   Social History:  The patient  reports that she has never smoked. She has never used smokeless tobacco. She reports current alcohol use. She reports that she does not use drugs.  Family History:  The patient's family history includes COPD in her brother; Cancer in her mother; Colon cancer in her father; Congestive Heart Failure in her brother; Dementia in her father; Heart attack in her daughter and mother; Heart disease in her mother; Stroke in her daughter and mother.  ROS:   Review of Systems  Constitutional: Positive for malaise/fatigue. Negative for chills, diaphoresis, fever and weight loss.  HENT: Negative for congestion.   Eyes: Negative for discharge and redness.  Respiratory: Negative for cough, hemoptysis, sputum production, shortness of breath and wheezing.   Cardiovascular: Negative for chest pain, palpitations, orthopnea, claudication, leg swelling and PND.  Gastrointestinal: Negative for abdominal pain, blood in stool, heartburn, melena, nausea and vomiting.  Genitourinary: Negative for hematuria.  Musculoskeletal: Positive for back pain. Negative for falls and myalgias.  Skin: Negative for rash.  Neurological: Negative for dizziness,  tingling, tremors, sensory change, speech change, focal weakness, loss of consciousness and weakness.  Endo/Heme/Allergies: Does not bruise/bleed easily.  Psychiatric/Behavioral: Negative for substance abuse. The patient is not nervous/anxious.   All other systems reviewed and are negative.    PHYSICAL EXAM:  VS:  BP (!) 170/90 (BP Location: Left Arm, Patient Position: Sitting, Cuff Size: Normal)   Pulse (!) 58   Ht 5\' 4"  (1.626 m)   Wt 142 lb (64.4 kg)   BMI 24.37 kg/m  BMI: Body mass index is 24.37 kg/m.  Physical Exam  Constitutional: She is oriented to person, place, and time. She appears well-developed and well-nourished.  HENT:  Head: Normocephalic and atraumatic.  Eyes: Right eye exhibits no discharge. Left eye exhibits no discharge.  Neck: Normal range of motion. No JVD present.  Cardiovascular: Regular rhythm, S1 normal, S2 normal and normal heart sounds. Bradycardia present. Exam reveals no distant heart sounds, no friction rub, no midsystolic click and no opening snap.  No murmur heard. Pulses:      Posterior tibial pulses are 2+ on the right side and 2+ on the left side.  Pulmonary/Chest: Effort normal and breath sounds normal. No respiratory distress. She has no decreased breath sounds. She has no wheezes. She has no rales. She exhibits no tenderness.  Abdominal: Soft. She exhibits no distension. There is no abdominal tenderness.  Musculoskeletal:        General: No edema.  Neurological: She is alert and oriented to person, place, and time.  Skin: Skin is warm and dry. No cyanosis. Nails show no clubbing.  Psychiatric: She has a normal mood and affect. Her speech is normal and behavior is normal. Judgment and thought content normal.     EKG:  Was ordered and interpreted by me today. Shows sinus bradycardia, 58 bpm, no acute ST-T changes  Recent Labs: 03/19/2018: Magnesium 2.0 05/25/2018: TSH 3.71 10/07/2018: ALT 17 10/28/2018: BUN 11; Creatinine, Ser 0.81; Hemoglobin  12.8; Platelets 263; Potassium 3.5; Sodium 139  10/07/2018: Cholesterol 194; HDL 49.20; LDL Cholesterol 119; Total CHOL/HDL Ratio 4; Triglycerides 128.0; VLDL 25.6   Estimated Creatinine Clearance: 55 mL/min (by C-G formula based on SCr of 0.81 mg/dL).   Wt Readings from Last 3 Encounters:  10/29/18 142 lb (64.4 kg)  10/28/18 143 lb 11.2 oz (65.2 kg)  10/28/18 143 lb (64.9 kg)     Other studies reviewed: Additional studies/records reviewed today include: summarized above  ASSESSMENT AND PLAN:  1. Preoperative cardiovascular examination: Patient is doing well from a cardiac perspective without any symptoms of ACS or decompensated heart failure.  Recent echocardiogram from 2018 demonstrated preserved  LV systolic function with normal wall motion.  Myoview at that time was without evidence of ischemia and a low risk.  She is able to achieve at least 4 METs with her main limiting factor being her back pain.  She advises that she has been told to stop her aspirin 5 to 7 days prior to planned surgery.  Given that she has no prior stenting she can discontinue aspirin as directed by her surgeons.  Resume aspirin at the discretion of her operative team.  No further cardiac testing is needed in the preoperative timeframe.  2. Essential hypertension: Blood pressure is suboptimally controlled likely in the setting of increased low back pain and stress.  In this setting, we have increased her amlodipine to 5 mg daily.  She will otherwise remain on Hyzaar 100/25 mg daily.  She is not on a beta-blocker given underlying sinus bradycardia.  She will check her blood pressure daily over the next several weeks and let us know if her blood pressure remains suboptimally controlled.  3. Fatigue: Cardiac work-up has been unrevealing.  It has been felt this is in the setting of poor sleep secondary to back pain.  She is hopeful following her surgery that her sleep improves, thus improving her fatigue.  She has previously  declined a sleep study.  Should her fatigue persist following improvement in her back pain would pursue outpatient sleep study.  4. Palpitations: Much improved.  Not on beta-blocker given underlying sinus bradycardia.  Prior outpatient cardiac monitoring did not reveal significant arrhythmia.  Continue to monitor.  Disposition: F/u with Dr. Saunders Revel or an APP in 3 months.  Current medicines are reviewed at length with the patient today.  The patient did not have any concerns regarding medicines.  Signed, Christell Faith, PA-C 10/29/2018 9:55 AM     [facility] [address] [place], [place] [address] [phone]

## 2018-10-28 NOTE — Progress Notes (Signed)
REASON FOR CONSULT:    To evaluate for anterior retroperitoneal exposure of L5-S1  HPI:   Kristen Strickland is a pleasant 73 y.o. female, who has had a long history of back pain.  She had back surgery from a posterior approach approximately 35 years ago.  About 3 years ago she began experiencing back pain again.  Her symptoms are aggravated by standing activity and even lying flat.  She is had multiple injections without significant relief.  She is failed conservative treatment.  She was felt to be a good candidate for anterior lumbar interbody fusion of L5-S1.  She is referred for evaluation for anterior retroperitoneal exposure.  She is had no previous abdominal surgery.  Her risk factors for peripheral vascular disease include hypertension and hypercholesterolemia.  She denies any history of diabetes, family history of premature cardiovascular disease, or tobacco use.  She has no history of DVT.  She does have a history of a moderate left carotid stenosis that is followed elsewhere.  This is asymptomatic.  She denies any history of stroke, TIAs, expressive or receptive aphasia, or amaurosis fugax.  Past Medical History:  Diagnosis Date  . Arthritis   . Carotid arterial disease ([place])    a. 11/2017 Carotid U/S: RICA 8-67%, LICA 61-95%.   . Chronic fatigue   . Chronic leg pain   . DDD (degenerative disc disease), lumbar   . Dyspnea on exertion    a. 02/2017 Echo: EF 60-65%, no rwma, mild MR. Nl RV size/fxn. Nl PASP; b. 02/2017 MV: small, mild, fixed basal inferolateral defect - ? artifact vs scar. No ischemia. EF >65%.  . Hypercholesterolemia   . Hypertension   . Insomnia   . Palpitations    a. 02/2017 Holter: Avg HR 66 (51-115), rare PACs, four brief runs of PAT - up to 5 beats, max rate 157. Rare isolated PVCs. No sustained arrhythmias; b. 03/2018 24h Holter: Rare PACs, 3 beat run PAT (122 bpm), PVCs (2% of beats).  . Radiculopathy    Lumbar region  . Sinus bradycardia   .  Spondylolisthesis    lumbosacral region    Family History  Problem Relation Age of Onset  . Cancer Mother        ovarian/uterine  . Heart disease Mother   . Heart attack Mother   . Stroke Mother   . Colon cancer Father   . Dementia Father   . COPD Brother   . Congestive Heart Failure Brother   . Stroke Daughter   . Heart attack Daughter   . Breast cancer Neg Hx     SOCIAL HISTORY: Social History   Socioeconomic History  . Marital status: Married    Spouse name: Not on file  . Number of children: 2  . Years of education: Not on file  . Highest education level: Not on file  Occupational History  . Not on file  Social Needs  . Financial resource strain: Not hard at all  . Food insecurity:    Worry: Never true    Inability: Never true  . Transportation needs:    Medical: No    Non-medical: No  Tobacco Use  . Smoking status: Never Smoker  . Smokeless tobacco: Never Used  Substance and Sexual Activity  . Alcohol use: Yes    Alcohol/week: 0.0 standard drinks    Comment: rarely  . Drug use: No  . Sexual activity: Yes  Lifestyle  . Physical activity:    Days per week:  Not on file    Minutes per session: Not on file  . Stress: Not on file  Relationships  . Social connections:    Talks on phone: Not on file    Gets together: Not on file    Attends religious service: Not on file    Active member of club or organization: Not on file    Attends meetings of clubs or organizations: Not on file    Relationship status: Not on file  . Intimate partner violence:    Fear of current or ex partner: No    Emotionally abused: No    Physically abused: No    Forced sexual activity: No  Other Topics Concern  . Not on file  Social History Narrative  . Not on file    No Known Allergies  Current Outpatient Medications  Medication Sig Dispense Refill  . amLODipine (NORVASC) 2.5 MG tablet Take 2.5 mg by mouth daily.    Marland Kitchen aspirin EC 81 MG tablet Take 81 mg by mouth daily.     Marland Kitchen ibuprofen (ADVIL,MOTRIN) 200 MG tablet Take 200 mg by mouth at bedtime.    Marland Kitchen losartan-hydrochlorothiazide (HYZAAR) 100-25 MG tablet Take 1 tablet by mouth daily. 90 tablet 1  . Multiple Vitamins-Minerals (CENTRUM PO) Take 1 tablet by mouth daily.     Marland Kitchen omeprazole (PRILOSEC) 20 MG capsule TAKE (1) CAPSULE BY MOUTH TWICE DAILY FOR REFLUX (Patient taking differently: Take 20 mg by mouth daily. ) 180 capsule 0  . pravastatin (PRAVACHOL) 10 MG tablet Take 1 tablet (10 mg total) by mouth daily. 30 tablet 5  . traMADol (ULTRAM) 50 MG tablet Take 1 tablet (50 mg total) by mouth daily. 30 tablet 0   No current facility-administered medications for this visit.     REVIEW OF SYSTEMS:  [X]  denotes positive finding, [ ]  denotes negative finding Cardiac  Comments:  Chest pain or chest pressure:    Shortness of breath upon exertion:    Short of breath when lying flat:    Irregular heart rhythm: x       Vascular    Pain in calf, thigh, or hip brought on by ambulation:    Pain in feet at night that wakes you up from your sleep:     Blood clot in your veins:    Leg swelling:         Pulmonary    Oxygen at home:    Productive cough:     Wheezing:         Neurologic    Sudden weakness in arms or legs:     Sudden numbness in arms or legs:     Sudden onset of difficulty speaking or slurred speech:    Temporary loss of vision in one eye:     Problems with dizziness:  x       Gastrointestinal    Blood in stool:     Vomited blood:         Genitourinary    Burning when urinating:     Blood in urine:        Psychiatric    Major depression:         Hematologic    Bleeding problems:    Problems with blood clotting too easily:        Skin    Rashes or ulcers:        Constitutional    Fever or chills:     PHYSICAL EXAM:   Vitals:  10/28/18 0849 10/28/18 0853  BP: (!) 207/79 (!) 198/80  Pulse: 66   Resp: 18   SpO2: 98%   Weight: 143 lb (64.9 kg)   Height: 5\' 4"  (1.626 m)      GENERAL: The patient is a well-nourished female, in no acute distress. The vital signs are documented above. CARDIAC: There is a regular rate and rhythm.  VASCULAR: I do not detect carotid bruits. She has palpable dorsalis pedis pulses. She has no significant lower extremity swelling. PULMONARY: There is good air exchange bilaterally without wheezing or rales. ABDOMEN: Soft and non-tender with normal pitched bowel sounds.  MUSCULOSKELETAL: There are no major deformities or cyanosis. NEUROLOGIC: No focal weakness or paresthesias are detected. SKIN: There are no ulcers or rashes noted. PSYCHIATRIC: The patient has a normal affect.  DATA:    CT LUMBAR SPINE: I reviewed her CT lumbar spine from 2016.  This shows no anatomic issues with respect to exposure of L5-S1.  She does have some calcium in her aorta and iliac arteries. MR LUMBAR SPINE: I reviewed her MRI of the lumbar spine that was done on 08/27/2018.  This shows significant degenerative disc disease at L5-S1.  ASSESSMENT & PLAN:   EVALUATION FOR ANTERIOR RETROPERITONEAL EXPOSURE OF L5-S1: The patient appears to be a good candidate for anterior lumbar interbody fusion at L5-S1 and I do not see any complicating features for exposure except that she does have some calcific disease in her aorta and iliac arteries based on her previous CT scan.  However I think we would be well below this for exposure at the L5-S1 level. I have reviewed our role in exposure of the spine in order to allow anterior lumbar interbody fusion at the appropriate levels. We have discussed the potential complications of surgery, including but not limited to, arterial or venous injury, thrombosis, or bleeding. We have also discussed the potential risks of wound healing problems, the development of a hernia, nerve injury, leg swelling, or other unpredictable medical problems.  All the patient's questions were answered and they are agreeable to proceed.  Her surgery is  scheduled for 11/10/2018  HYPERTENSION: The patient's blood pressure was significantly elevated today however she did not take her blood pressure medication this morning.  LEFT CAROTID STENOSIS: The patient has a known 27 to 59% left carotid stenosis based on a duplex scan in February 2019.  This is asymptomatic.  She is on aspirin.  She would be due for a follow-up carotid duplex scan in February.  This is followed elsewhere.  I do not think this is a contraindication to proceeding with her surgery.    Deitra Mayo [facility] [phone]

## 2018-10-28 NOTE — H&P (View-Only) (Signed)
REASON FOR CONSULT:    To evaluate for anterior retroperitoneal exposure of L5-S1  HPI:   Kristen Strickland is a pleasant 73 y.o. female, who has had a long history of back pain.  She had back surgery from a posterior approach approximately 35 years ago.  About 3 years ago she began experiencing back pain again.  Her symptoms are aggravated by standing activity and even lying flat.  She is had multiple injections without significant relief.  She is failed conservative treatment.  She was felt to be a good candidate for anterior lumbar interbody fusion of L5-S1.  She is referred for evaluation for anterior retroperitoneal exposure.  She is had no previous abdominal surgery.  Her risk factors for peripheral vascular disease include hypertension and hypercholesterolemia.  She denies any history of diabetes, family history of premature cardiovascular disease, or tobacco use.  She has no history of DVT.  She does have a history of a moderate left carotid stenosis that is followed elsewhere.  This is asymptomatic.  She denies any history of stroke, TIAs, expressive or receptive aphasia, or amaurosis fugax.  Past Medical History:  Diagnosis Date  . Arthritis   . Carotid arterial disease ([place])    a. 11/2017 Carotid U/S: RICA 1-61%, LICA 09-60%.   . Chronic fatigue   . Chronic leg pain   . DDD (degenerative disc disease), lumbar   . Dyspnea on exertion    a. 02/2017 Echo: EF 60-65%, no rwma, mild MR. Nl RV size/fxn. Nl PASP; b. 02/2017 MV: small, mild, fixed basal inferolateral defect - ? artifact vs scar. No ischemia. EF >65%.  . Hypercholesterolemia   . Hypertension   . Insomnia   . Palpitations    a. 02/2017 Holter: Avg HR 66 (51-115), rare PACs, four brief runs of PAT - up to 5 beats, max rate 157. Rare isolated PVCs. No sustained arrhythmias; b. 03/2018 24h Holter: Rare PACs, 3 beat run PAT (122 bpm), PVCs (2% of beats).  . Radiculopathy    Lumbar region  . Sinus bradycardia   .  Spondylolisthesis    lumbosacral region    Family History  Problem Relation Age of Onset  . Cancer Mother        ovarian/uterine  . Heart disease Mother   . Heart attack Mother   . Stroke Mother   . Colon cancer Father   . Dementia Father   . COPD Brother   . Congestive Heart Failure Brother   . Stroke Daughter   . Heart attack Daughter   . Breast cancer Neg Hx     SOCIAL HISTORY: Social History   Socioeconomic History  . Marital status: Married    Spouse name: Not on file  . Number of children: 2  . Years of education: Not on file  . Highest education level: Not on file  Occupational History  . Not on file  Social Needs  . Financial resource strain: Not hard at all  . Food insecurity:    Worry: Never true    Inability: Never true  . Transportation needs:    Medical: No    Non-medical: No  Tobacco Use  . Smoking status: Never Smoker  . Smokeless tobacco: Never Used  Substance and Sexual Activity  . Alcohol use: Yes    Alcohol/week: 0.0 standard drinks    Comment: rarely  . Drug use: No  . Sexual activity: Yes  Lifestyle  . Physical activity:    Days per week:  Not on file    Minutes per session: Not on file  . Stress: Not on file  Relationships  . Social connections:    Talks on phone: Not on file    Gets together: Not on file    Attends religious service: Not on file    Active member of club or organization: Not on file    Attends meetings of clubs or organizations: Not on file    Relationship status: Not on file  . Intimate partner violence:    Fear of current or ex partner: No    Emotionally abused: No    Physically abused: No    Forced sexual activity: No  Other Topics Concern  . Not on file  Social History Narrative  . Not on file    No Known Allergies  Current Outpatient Medications  Medication Sig Dispense Refill  . amLODipine (NORVASC) 2.5 MG tablet Take 2.5 mg by mouth daily.    Marland Kitchen aspirin EC 81 MG tablet Take 81 mg by mouth daily.     Marland Kitchen ibuprofen (ADVIL,MOTRIN) 200 MG tablet Take 200 mg by mouth at bedtime.    Marland Kitchen losartan-hydrochlorothiazide (HYZAAR) 100-25 MG tablet Take 1 tablet by mouth daily. 90 tablet 1  . Multiple Vitamins-Minerals (CENTRUM PO) Take 1 tablet by mouth daily.     Marland Kitchen omeprazole (PRILOSEC) 20 MG capsule TAKE (1) CAPSULE BY MOUTH TWICE DAILY FOR REFLUX (Patient taking differently: Take 20 mg by mouth daily. ) 180 capsule 0  . pravastatin (PRAVACHOL) 10 MG tablet Take 1 tablet (10 mg total) by mouth daily. 30 tablet 5  . traMADol (ULTRAM) 50 MG tablet Take 1 tablet (50 mg total) by mouth daily. 30 tablet 0   No current facility-administered medications for this visit.     REVIEW OF SYSTEMS:  [X]  denotes positive finding, [ ]  denotes negative finding Cardiac  Comments:  Chest pain or chest pressure:    Shortness of breath upon exertion:    Short of breath when lying flat:    Irregular heart rhythm: x       Vascular    Pain in calf, thigh, or hip brought on by ambulation:    Pain in feet at night that wakes you up from your sleep:     Blood clot in your veins:    Leg swelling:         Pulmonary    Oxygen at home:    Productive cough:     Wheezing:         Neurologic    Sudden weakness in arms or legs:     Sudden numbness in arms or legs:     Sudden onset of difficulty speaking or slurred speech:    Temporary loss of vision in one eye:     Problems with dizziness:  x       Gastrointestinal    Blood in stool:     Vomited blood:         Genitourinary    Burning when urinating:     Blood in urine:        Psychiatric    Major depression:         Hematologic    Bleeding problems:    Problems with blood clotting too easily:        Skin    Rashes or ulcers:        Constitutional    Fever or chills:     PHYSICAL EXAM:   Vitals:  10/28/18 0849 10/28/18 0853  BP: (!) 207/79 (!) 198/80  Pulse: 66   Resp: 18   SpO2: 98%   Weight: 143 lb (64.9 kg)   Height: 5\' 4"  (1.626 m)      GENERAL: The patient is a well-nourished female, in no acute distress. The vital signs are documented above. CARDIAC: There is a regular rate and rhythm.  VASCULAR: I do not detect carotid bruits. She has palpable dorsalis pedis pulses. She has no significant lower extremity swelling. PULMONARY: There is good air exchange bilaterally without wheezing or rales. ABDOMEN: Soft and non-tender with normal pitched bowel sounds.  MUSCULOSKELETAL: There are no major deformities or cyanosis. NEUROLOGIC: No focal weakness or paresthesias are detected. SKIN: There are no ulcers or rashes noted. PSYCHIATRIC: The patient has a normal affect.  DATA:    CT LUMBAR SPINE: I reviewed her CT lumbar spine from 2016.  This shows no anatomic issues with respect to exposure of L5-S1.  She does have some calcium in her aorta and iliac arteries. MR LUMBAR SPINE: I reviewed her MRI of the lumbar spine that was done on 08/27/2018.  This shows significant degenerative disc disease at L5-S1.  ASSESSMENT & PLAN:   EVALUATION FOR ANTERIOR RETROPERITONEAL EXPOSURE OF L5-S1: The patient appears to be a good candidate for anterior lumbar interbody fusion at L5-S1 and I do not see any complicating features for exposure except that she does have some calcific disease in her aorta and iliac arteries based on her previous CT scan.  However I think we would be well below this for exposure at the L5-S1 level. I have reviewed our role in exposure of the spine in order to allow anterior lumbar interbody fusion at the appropriate levels. We have discussed the potential complications of surgery, including but not limited to, arterial or venous injury, thrombosis, or bleeding. We have also discussed the potential risks of wound healing problems, the development of a hernia, nerve injury, leg swelling, or other unpredictable medical problems.  All the patient's questions were answered and they are agreeable to proceed.  Her surgery is  scheduled for 11/10/2018  HYPERTENSION: The patient's blood pressure was significantly elevated today however she did not take her blood pressure medication this morning.  LEFT CAROTID STENOSIS: The patient has a known 51 to 59% left carotid stenosis based on a duplex scan in February 2019.  This is asymptomatic.  She is on aspirin.  She would be due for a follow-up carotid duplex scan in February.  This is followed elsewhere.  I do not think this is a contraindication to proceeding with her surgery.    Deitra Mayo [facility] [phone]

## 2018-10-29 ENCOUNTER — Ambulatory Visit (INDEPENDENT_AMBULATORY_CARE_PROVIDER_SITE_OTHER): Payer: Medicare Other | Admitting: Physician Assistant

## 2018-10-29 ENCOUNTER — Encounter: Payer: Self-pay | Admitting: Physician Assistant

## 2018-10-29 VITALS — BP 170/90 | HR 58 | Ht 64.0 in | Wt 142.0 lb

## 2018-10-29 DIAGNOSIS — R002 Palpitations: Secondary | ICD-10-CM | POA: Diagnosis not present

## 2018-10-29 DIAGNOSIS — R5383 Other fatigue: Secondary | ICD-10-CM | POA: Diagnosis not present

## 2018-10-29 DIAGNOSIS — I10 Essential (primary) hypertension: Secondary | ICD-10-CM | POA: Diagnosis not present

## 2018-10-29 DIAGNOSIS — Z01810 Encounter for preprocedural cardiovascular examination: Secondary | ICD-10-CM

## 2018-10-29 MED ORDER — AMLODIPINE BESYLATE 5 MG PO TABS: 5.0000 mg | ORAL_TABLET | Freq: Every day | ORAL | 3 refills | Status: DC

## 2018-10-29 MED ORDER — LOSARTAN POTASSIUM-HCTZ 100-25 MG PO TABS: 1.0000 | ORAL_TABLET | Freq: Every day | ORAL | 0 refills | Status: DC

## 2018-10-29 MED ORDER — PRAVASTATIN SODIUM 10 MG PO TABS: 10.0000 mg | ORAL_TABLET | Freq: Every day | ORAL | 0 refills | Status: DC

## 2018-10-29 NOTE — Anesthesia Preprocedure Evaluation (Addendum)
Anesthesia Evaluation  Patient identified by MRN, date of birth, ID band Patient awake    Reviewed: Allergy & Precautions, NPO status , Patient's Chart, lab work & pertinent test results  History of Anesthesia Complications Negative for: history of anesthetic complications  Airway Mallampati: II  TM Distance: >3 FB Neck ROM: Full    Dental  (+) Dental Advisory Given, Teeth Intact   Pulmonary neg pulmonary ROS,    breath sounds clear to auscultation       Cardiovascular hypertension, Pt. on medications + Peripheral Vascular Disease   Rhythm:Regular Rate:Normal   '19 Carotid US - 1-39% right ICAS, 40-59% left ICAS  '18 TTE - EF 60% to 65%. Mild MR.    Neuro/Psych  Neuromuscular disease (lumbar radiculopathy) negative psych ROS   GI/Hepatic Neg liver ROS, GERD  Medicated and Controlled,  Endo/Other  negative endocrine ROS  Renal/GU negative Renal ROS     Musculoskeletal  (+) Arthritis ,   Abdominal   Peds  Hematology negative hematology ROS (+)   Anesthesia Other Findings   Reproductive/Obstetrics                           Anesthesia Physical Anesthesia Plan  ASA: III  Anesthesia Plan: General   Post-op Pain Management:    Induction: Intravenous  PONV Risk Score and Plan: 4 or greater and Treatment may vary due to age or medical condition, Ondansetron and Dexamethasone  Airway Management Planned: Oral ETT  Additional Equipment: None  Intra-op Plan:   Post-operative Plan: Extubation in OR  Informed Consent: I have reviewed the patients History and Physical, chart, labs and discussed the procedure including the risks, benefits and alternatives for the proposed anesthesia with the patient or authorized representative who has indicated his/her understanding and acceptance.     Dental advisory given  Plan Discussed with: CRNA and Anesthesiologist  Anesthesia Plan Comments:        Anesthesia Quick Evaluation

## 2018-10-29 NOTE — Patient Instructions (Signed)
Medication Instructions:  Your physician has recommended you make the following change in your medication: 1- INCREASE Amlodipine to 2 tablets (5 mg total) once daily If you need a refill on your cardiac medications before your next appointment, please call your pharmacy.   Lab work: None ordered   If you have labs (blood work) drawn today and your tests are completely normal, you will receive your results only by: Marland Kitchen MyChart Message (if you have MyChart) OR . A paper copy in the mail If you have any lab test that is abnormal or we need to change your treatment, we will call you to review the results.  Testing/Procedures: None ordered   Follow-Up: At [facility], you and your health needs are our priority.  As part of our continuing mission to provide you with exceptional heart care, we have created designated Provider Care Teams.  These Care Teams include your primary Cardiologist (physician) and Advanced Practice Providers (APPs -  Physician Assistants and Nurse Practitioners) who all work together to provide you with the care you need, when you need it. You will need a follow up appointment in 3 months.   You may see Nelva Bush, MD or one of the following Advanced Practice Providers on your designated Care Team:   Murray Hodgkins, NP Christell Faith, PA-C . Marrianne Mood, PA-C

## 2018-10-29 NOTE — Progress Notes (Signed)
Anesthesia Chart Review:  Case:  [id number] Date/Time:  11/10/18 0715   Procedures:      ALIF - L5-S1 (N/A )     ABDOMINAL EXPOSURE (N/A )   Anesthesia type:  General   Pre-op diagnosis:  Spondylolisthesis   Location:  [facility] OR ROOM 19 / [place] [facility]   Surgeon:  Earnie Larsson, MD; Angelia Mould, MD      DISCUSSION: Patient is a 73 year old female scheduled for the above procedure.  History includes never smoker, chronic fatigue, palpitations, exertional dyspnea, hypercholesterolemia, HTN, carotid artery disease, bradycardia, C5-7 ACDF 04/09/13.   She was seen by Christell Faith, PA-C on 10/29/17 for follow-up and preoperative evaluation: Previous cardiac testing in 2018 for fatigue evaluation. No further cardiac testing recommended prior to surgery. Permission to temporarily hold ASA per surgeon recommendations. Also with pulmonology evaluation within the past year with as needed follow-up.  Based on currently available information, I would anticipate that she can proceed as planned if no acute changes.   VS: BP (!) 156/51   Pulse 63   Temp 36.7 C   Resp 18   Ht 5\' 4"  (1.626 m)   Wt 65.2 kg   SpO2 98%   BMI 24.67 kg/m   PROVIDERS: Einar Pheasant, MD is PCP. Last visit 10/12/18.  Nelva Bush, MD is cardiologist. Last visit with Christell Faith, PA-C on 10/29/18 for preoperative evaluation. If fatigue continues following surgery then out-patient sleep study recommended. Palpitations improved. No b-blocker given bradycardia. Laverle Hobby, MD is pulmonologist. Last visit 01/22/18 for follow-up DOE. PFTs, 6 minute walk test, and CXR did not reveal any evidence of intrinsic lung disease. She will follow-up if persistent/worsening symptoms. - Hortencia Pilar, MD is vascular surgeon. Last visti 12/08/17. One year follow-up for carotid disease recommended.   LABS: Labs reviewed: Acceptable for surgery. (all labs ordered are listed, but only abnormal results are displayed)  Labs Reviewed   BASIC METABOLIC PANEL - Abnormal; Notable for the following components:      Result Value   Glucose, Bld 155 (*)    All other components within normal limits  CBC WITH DIFFERENTIAL/PLATELET  TYPE AND SCREEN  ABO/RH    OTHER: PFTs 01/08/18: FVC 2.07 (72%), FEV1 1.62 (71%). FEV1/FVC 78%. DLCO 14.4 (75%)  Walk tests: - 12/24/17: "desat walk at rest, on RA, sat 98% and HR 67. Patient walked at a very fast pace 540 feet. Sat was 97% and HR 89. She had minimal dyspnea, she felt lightheaded." - 01/01/18: "6-minute walk test no desaturations."   IMAGES: CXR 12/24/17: IMPRESSION: No acute cardiopulmonary process.   EKG: 04/22/18: NSR.   CV: 24 Hour Holter monitor 03/25/18: Normal sinus rhythm Rare APCs,  less than 1% of total beats 3 beat run on 122 bpm PVCs,  2% of total beats (Per Murray Hodgkins, NP, "No significant arrhythmias or slow heart beats. Occasional extra beats from the top chamber, which are normal and account for less than 1% of total beats. Overall reassuring.")  Echo 03/06/17: Study Conclusions - Left ventricle: The cavity size was normal. Systolic function was   normal. The estimated ejection fraction was in the range of 60%   to 65%. Wall motion was normal; there were no regional wall   motion abnormalities. Left ventricular diastolic function   parameters were normal. - Mitral valve: There was mild regurgitation. - Left atrium: The atrium was normal in size. - Right ventricle: Systolic function was normal. - Pulmonary arteries: Systolic pressure  was within the normal   range.  Nuclear stress test 02/25/17:  This is a low risk study without evidence of ischemia.  There is a small in size, mild in severity, fixed defect involving the basal inferolateral segment, which may represent artifact or subtle scar.  The left ventricular ejection fraction is hyperdynamic (>65%).  Horizontal ST segment depression ST segment depression of 1.5 mm was noted during stress  in the II, III, aVF, V5 and V6 leads.  Carotid U/S 12/08/17: Final Interpretation: Right Carotid: Velocities in the right ICA are consistent with a 1-39% stenosis.                No significant change from the previous exam. Left Carotid: Velocities in the left ICA are consistent with a 40-59% stenosis.               Maximum left ICA velocity appears less than previously recorded               when compared to the previous exam. Vertebrals:  Both vertebral arteries were patent with antegrade flow. Subclavians: Normal flow hemodynamics were seen in bilateral subclavian              arteries.   Past Medical History:  Diagnosis Date  . Arthritis   . Carotid arterial disease ([place])    a. 11/2017 Carotid U/S: RICA 8-52%, LICA 77-82%.   . Chronic fatigue   . Chronic leg pain   . DDD (degenerative disc disease), lumbar   . Dyspnea on exertion    a. 02/2017 Echo: EF 60-65%, no rwma, mild MR. Nl RV size/fxn. Nl PASP; b. 02/2017 MV: small, mild, fixed basal inferolateral defect - ? artifact vs scar. No ischemia. EF >65%.  . Hypercholesterolemia   . Hypertension   . Insomnia   . Palpitations    a. 02/2017 Holter: Avg HR 66 (51-115), rare PACs, four brief runs of PAT - up to 5 beats, max rate 157. Rare isolated PVCs. No sustained arrhythmias; b. 03/2018 24h Holter: Rare PACs, 3 beat run PAT (122 bpm), PVCs (2% of beats).  . Radiculopathy    Lumbar region  . Sinus bradycardia   . Spondylolisthesis    lumbosacral region    Past Surgical History:  Procedure Laterality Date  . ABDOMINAL HYSTERECTOMY  1975  . ANTERIOR CERVICAL DECOMP/DISCECTOMY FUSION N/A 04/09/2013   Procedure: ANTERIOR CERVICAL DECOMPRESSION/DISCECTOMY FUSION 2 LEVELS;  Surgeon: Floyce Stakes, MD;  Location: [facility] NEURO ORS;  Service: Neurosurgery;  Laterality: N/A;  Cervical five-six Cervical six-seven  Anterior cervical decompression/diskectomy/fusion  . BACK SURGERY     ruptured disc  . RECONSTRUCTION OF NOSE       MEDICATIONS: . amLODipine (NORVASC) 5 MG tablet  . aspirin EC 81 MG tablet  . ibuprofen (ADVIL,MOTRIN) 200 MG tablet  . losartan-hydrochlorothiazide (HYZAAR) 100-25 MG tablet  . Multiple Vitamins-Minerals (CENTRUM PO)  . omeprazole (PRILOSEC) 20 MG capsule  . pravastatin (PRAVACHOL) 10 MG tablet  . traMADol (ULTRAM) 50 MG tablet   No current facility-administered medications for this encounter.     Myra Gianotti, PA-C Surgical Short Stay/Anesthesiology [facility] Phone [phone] [facility] Phone [phone] 10/29/2018 4:39 PM

## 2018-11-04 ENCOUNTER — Ambulatory Visit
Admission: RE | Admit: 2018-11-04 | Discharge: 2018-11-04 | Disposition: A | Discharge status: Home / Self Care | Payer: Medicare Other | Source: Ambulatory Visit | Attending: Internal Medicine | Admitting: Internal Medicine

## 2018-11-04 DIAGNOSIS — Z1231 Encounter for screening mammogram for malignant neoplasm of breast: Secondary | ICD-10-CM | POA: Diagnosis not present

## 2018-11-10 ENCOUNTER — Encounter (HOSPITAL_COMMUNITY): Payer: Self-pay | Admitting: *Deleted

## 2018-11-10 ENCOUNTER — Inpatient Hospital Stay (HOSPITAL_COMMUNITY): Payer: Medicare Other

## 2018-11-10 ENCOUNTER — Inpatient Hospital Stay (HOSPITAL_COMMUNITY): Payer: Medicare Other | Admitting: Vascular Surgery

## 2018-11-10 ENCOUNTER — Inpatient Hospital Stay (HOSPITAL_COMMUNITY): Payer: Medicare Other | Admitting: Certified Registered Nurse Anesthetist

## 2018-11-10 ENCOUNTER — Encounter (HOSPITAL_COMMUNITY)
Admission: RE | Disposition: A | Discharge status: Home / Self Care | Payer: Self-pay | Source: Home / Self Care | Attending: Neurosurgery

## 2018-11-10 ENCOUNTER — Inpatient Hospital Stay (HOSPITAL_COMMUNITY)
Admission: RE | Admit: 2018-11-10 | Discharge: 2018-11-11 | DRG: 460 | Disposition: A | Discharge status: Home / Self Care | Payer: Medicare Other | Attending: Neurosurgery | Admitting: Neurosurgery

## 2018-11-10 ENCOUNTER — Other Ambulatory Visit: Payer: Self-pay

## 2018-11-10 DIAGNOSIS — Z800 Family history of malignant neoplasm of digestive organs: Secondary | ICD-10-CM

## 2018-11-10 DIAGNOSIS — Z7982 Long term (current) use of aspirin: Secondary | ICD-10-CM

## 2018-11-10 DIAGNOSIS — Z825 Family history of asthma and other chronic lower respiratory diseases: Secondary | ICD-10-CM

## 2018-11-10 DIAGNOSIS — M48061 Spinal stenosis, lumbar region without neurogenic claudication: Secondary | ICD-10-CM | POA: Diagnosis not present

## 2018-11-10 DIAGNOSIS — M2578 Osteophyte, vertebrae: Secondary | ICD-10-CM | POA: Diagnosis present

## 2018-11-10 DIAGNOSIS — M4807 Spinal stenosis, lumbosacral region: Secondary | ICD-10-CM | POA: Diagnosis present

## 2018-11-10 DIAGNOSIS — Z79899 Other long term (current) drug therapy: Secondary | ICD-10-CM | POA: Diagnosis not present

## 2018-11-10 DIAGNOSIS — Z8249 Family history of ischemic heart disease and other diseases of the circulatory system: Secondary | ICD-10-CM

## 2018-11-10 DIAGNOSIS — M4310 Spondylolisthesis, site unspecified: Secondary | ICD-10-CM | POA: Diagnosis present

## 2018-11-10 DIAGNOSIS — Z419 Encounter for procedure for purposes other than remedying health state, unspecified: Secondary | ICD-10-CM

## 2018-11-10 DIAGNOSIS — I6523 Occlusion and stenosis of bilateral carotid arteries: Secondary | ICD-10-CM | POA: Diagnosis present

## 2018-11-10 DIAGNOSIS — Z791 Long term (current) use of non-steroidal anti-inflammatories (NSAID): Secondary | ICD-10-CM

## 2018-11-10 DIAGNOSIS — M4317 Spondylolisthesis, lumbosacral region: Secondary | ICD-10-CM | POA: Diagnosis not present

## 2018-11-10 DIAGNOSIS — Z79891 Long term (current) use of opiate analgesic: Secondary | ICD-10-CM

## 2018-11-10 DIAGNOSIS — E7800 Pure hypercholesterolemia, unspecified: Secondary | ICD-10-CM | POA: Diagnosis not present

## 2018-11-10 DIAGNOSIS — K219 Gastro-esophageal reflux disease without esophagitis: Secondary | ICD-10-CM | POA: Diagnosis not present

## 2018-11-10 DIAGNOSIS — Z823 Family history of stroke: Secondary | ICD-10-CM | POA: Diagnosis not present

## 2018-11-10 DIAGNOSIS — M4316 Spondylolisthesis, lumbar region: Secondary | ICD-10-CM | POA: Diagnosis not present

## 2018-11-10 DIAGNOSIS — I10 Essential (primary) hypertension: Secondary | ICD-10-CM | POA: Diagnosis not present

## 2018-11-10 DIAGNOSIS — M4327 Fusion of spine, lumbosacral region: Secondary | ICD-10-CM | POA: Diagnosis not present

## 2018-11-10 HISTORY — PX: ABDOMINAL EXPOSURE: SHX5708

## 2018-11-10 HISTORY — PX: ANTERIOR LUMBAR FUSION: SHX1170

## 2018-11-10 SURGERY — ANTERIOR LUMBAR FUSION 1 LEVEL
Anesthesia: General | Site: Spine Lumbar

## 2018-11-10 MED ORDER — LOSARTAN POTASSIUM 50 MG PO TABS
100.0000 mg | ORAL_TABLET | Freq: Every day | ORAL | Status: DC
  Filled 2018-11-10 (×2): qty 2

## 2018-11-10 MED ORDER — PROPOFOL 10 MG/ML IV BOLUS
INTRAVENOUS | Status: DC | PRN
  Administered 2018-11-10: 130 mg via INTRAVENOUS

## 2018-11-10 MED ORDER — LACTATED RINGERS IV SOLN
INTRAVENOUS | Status: DC | PRN
  Administered 2018-11-10 (×2): via INTRAVENOUS

## 2018-11-10 MED ORDER — FENTANYL CITRATE (PF) 100 MCG/2ML IJ SOLN
25.0000 ug | INTRAMUSCULAR | Status: DC | PRN
  Administered 2018-11-10: 25 ug via INTRAVENOUS

## 2018-11-10 MED ORDER — BUPIVACAINE HCL (PF) 0.25 % IJ SOLN
INTRAMUSCULAR | Status: AC
  Filled 2018-11-10: qty 30

## 2018-11-10 MED ORDER — SODIUM CHLORIDE 0.9 % IV SOLN
INTRAVENOUS | Status: DC | PRN
  Administered 2018-11-10: 500 mL

## 2018-11-10 MED ORDER — FENTANYL CITRATE (PF) 100 MCG/2ML IJ SOLN
INTRAMUSCULAR | Status: AC
  Filled 2018-11-10: qty 2

## 2018-11-10 MED ORDER — PANTOPRAZOLE SODIUM 40 MG PO TBEC
80.0000 mg | DELAYED_RELEASE_TABLET | Freq: Every day | ORAL | Status: DC
  Filled 2018-11-10: qty 2

## 2018-11-10 MED ORDER — CHLORHEXIDINE GLUCONATE 4 % EX LIQD: 60.0000 mL | Freq: Once | CUTANEOUS | Status: DC

## 2018-11-10 MED ORDER — ROCURONIUM BROMIDE 50 MG/5ML IV SOSY
PREFILLED_SYRINGE | INTRAVENOUS | Status: AC
  Filled 2018-11-10: qty 5

## 2018-11-10 MED ORDER — ROCURONIUM BROMIDE 10 MG/ML (PF) SYRINGE
PREFILLED_SYRINGE | INTRAVENOUS | Status: DC | PRN
  Administered 2018-11-10 (×2): 50 mg via INTRAVENOUS

## 2018-11-10 MED ORDER — ONDANSETRON HCL 4 MG PO TABS: 4.0000 mg | ORAL_TABLET | Freq: Four times a day (QID) | ORAL | Status: DC | PRN

## 2018-11-10 MED ORDER — PHENYLEPHRINE HCL 10 MG/ML IJ SOLN
INTRAMUSCULAR | Status: AC
  Filled 2018-11-10: qty 1

## 2018-11-10 MED ORDER — AMLODIPINE BESYLATE 5 MG PO TABS
5.0000 mg | ORAL_TABLET | Freq: Every day | ORAL | Status: DC
  Filled 2018-11-10: qty 1

## 2018-11-10 MED ORDER — FENTANYL CITRATE (PF) 250 MCG/5ML IJ SOLN
INTRAMUSCULAR | Status: AC
  Filled 2018-11-10: qty 5

## 2018-11-10 MED ORDER — MIDAZOLAM HCL 2 MG/2ML IJ SOLN
INTRAMUSCULAR | Status: DC | PRN
  Administered 2018-11-10: 1 mg via INTRAVENOUS

## 2018-11-10 MED ORDER — THROMBIN 20000 UNITS EX SOLR
CUTANEOUS | Status: DC | PRN
  Administered 2018-11-10: 20 mL via TOPICAL

## 2018-11-10 MED ORDER — 0.9 % SODIUM CHLORIDE (POUR BTL) OPTIME
TOPICAL | Status: DC | PRN
  Administered 2018-11-10: 1000 mL

## 2018-11-10 MED ORDER — EPHEDRINE SULFATE-NACL 50-0.9 MG/10ML-% IV SOSY
PREFILLED_SYRINGE | INTRAVENOUS | Status: DC | PRN
  Administered 2018-11-10: 20 mg via INTRAVENOUS

## 2018-11-10 MED ORDER — ASPIRIN EC 81 MG PO TBEC
81.0000 mg | DELAYED_RELEASE_TABLET | Freq: Every day | ORAL | Status: DC
  Filled 2018-11-10: qty 1

## 2018-11-10 MED ORDER — ADULT MULTIVITAMIN W/MINERALS CH
1.0000 | ORAL_TABLET | Freq: Every day | ORAL | Status: DC
  Filled 2018-11-10 (×2): qty 1

## 2018-11-10 MED ORDER — ONDANSETRON HCL 4 MG/2ML IJ SOLN: 4.0000 mg | Freq: Once | INTRAMUSCULAR | Status: DC | PRN

## 2018-11-10 MED ORDER — POLYETHYLENE GLYCOL 3350 17 G PO PACK: 17.0000 g | PACK | Freq: Every day | ORAL | Status: DC | PRN

## 2018-11-10 MED ORDER — LIDOCAINE 2% (20 MG/ML) 5 ML SYRINGE
INTRAMUSCULAR | Status: AC
  Filled 2018-11-10: qty 5

## 2018-11-10 MED ORDER — THROMBIN 20000 UNITS EX SOLR
CUTANEOUS | Status: AC
  Filled 2018-11-10: qty 20000

## 2018-11-10 MED ORDER — ONDANSETRON HCL 4 MG/2ML IJ SOLN
INTRAMUSCULAR | Status: DC | PRN
  Administered 2018-11-10: 4 mg via INTRAVENOUS

## 2018-11-10 MED ORDER — FLEET ENEMA 7-19 GM/118ML RE ENEM: 1.0000 | ENEMA | Freq: Once | RECTAL | Status: DC | PRN

## 2018-11-10 MED ORDER — HYDROCODONE-ACETAMINOPHEN 10-325 MG PO TABS
1.0000 | ORAL_TABLET | ORAL | Status: DC | PRN
  Administered 2018-11-10 – 2018-11-11 (×4): 1 via ORAL
  Filled 2018-11-10 (×3): qty 1

## 2018-11-10 MED ORDER — MENTHOL 3 MG MT LOZG: 1.0000 | LOZENGE | OROMUCOSAL | Status: DC | PRN

## 2018-11-10 MED ORDER — FENTANYL CITRATE (PF) 250 MCG/5ML IJ SOLN
INTRAMUSCULAR | Status: DC | PRN
  Administered 2018-11-10: 100 ug via INTRAVENOUS
  Administered 2018-11-10: 50 ug via INTRAVENOUS

## 2018-11-10 MED ORDER — HYDROCHLOROTHIAZIDE 25 MG PO TABS
25.0000 mg | ORAL_TABLET | Freq: Every day | ORAL | Status: DC
  Filled 2018-11-10 (×2): qty 1

## 2018-11-10 MED ORDER — DIAZEPAM 5 MG PO TABS
5.0000 mg | ORAL_TABLET | Freq: Four times a day (QID) | ORAL | Status: DC | PRN
  Administered 2018-11-10: 5 mg via ORAL

## 2018-11-10 MED ORDER — PHENOL 1.4 % MT LIQD: 1.0000 | OROMUCOSAL | Status: DC | PRN

## 2018-11-10 MED ORDER — ACETAMINOPHEN 650 MG RE SUPP: 650.0000 mg | RECTAL | Status: DC | PRN

## 2018-11-10 MED ORDER — LIDOCAINE 2% (20 MG/ML) 5 ML SYRINGE
INTRAMUSCULAR | Status: DC | PRN
  Administered 2018-11-10: 60 mg via INTRAVENOUS

## 2018-11-10 MED ORDER — LOSARTAN POTASSIUM-HCTZ 100-25 MG PO TABS: 1.0000 | ORAL_TABLET | Freq: Every day | ORAL | Status: DC

## 2018-11-10 MED ORDER — HYDROCODONE-ACETAMINOPHEN 10-325 MG PO TABS
ORAL_TABLET | ORAL | Status: AC
  Filled 2018-11-10: qty 1

## 2018-11-10 MED ORDER — DEXAMETHASONE SODIUM PHOSPHATE 10 MG/ML IJ SOLN
INTRAMUSCULAR | Status: AC
  Filled 2018-11-10: qty 1

## 2018-11-10 MED ORDER — KETAMINE HCL 50 MG/5ML IJ SOSY
PREFILLED_SYRINGE | INTRAMUSCULAR | Status: AC
  Filled 2018-11-10: qty 5

## 2018-11-10 MED ORDER — ONDANSETRON HCL 4 MG/2ML IJ SOLN
INTRAMUSCULAR | Status: AC
  Filled 2018-11-10: qty 2

## 2018-11-10 MED ORDER — OXYCODONE HCL 5 MG PO TABS: 5.0000 mg | ORAL_TABLET | Freq: Once | ORAL | Status: DC | PRN

## 2018-11-10 MED ORDER — DEXAMETHASONE SODIUM PHOSPHATE 10 MG/ML IJ SOLN
10.0000 mg | INTRAMUSCULAR | Status: DC
  Filled 2018-11-10: qty 1

## 2018-11-10 MED ORDER — ACETAMINOPHEN 325 MG PO TABS: 650.0000 mg | ORAL_TABLET | ORAL | Status: DC | PRN

## 2018-11-10 MED ORDER — MIDAZOLAM HCL 2 MG/2ML IJ SOLN
INTRAMUSCULAR | Status: AC
  Filled 2018-11-10: qty 2

## 2018-11-10 MED ORDER — OXYCODONE HCL 5 MG PO TABS: 10.0000 mg | ORAL_TABLET | ORAL | Status: DC | PRN

## 2018-11-10 MED ORDER — CEFAZOLIN SODIUM-DEXTROSE 1-4 GM/50ML-% IV SOLN
1.0000 g | Freq: Three times a day (TID) | INTRAVENOUS | Status: AC
  Administered 2018-11-10 (×2): 1 g via INTRAVENOUS
  Filled 2018-11-10 (×2): qty 50

## 2018-11-10 MED ORDER — SODIUM CHLORIDE 0.9 % IV SOLN: 250.0000 mL | INTRAVENOUS | Status: DC

## 2018-11-10 MED ORDER — TRAMADOL HCL 50 MG PO TABS: 50.0000 mg | ORAL_TABLET | Freq: Every day | ORAL | Status: DC | PRN

## 2018-11-10 MED ORDER — OXYCODONE HCL 5 MG/5ML PO SOLN: 5.0000 mg | Freq: Once | ORAL | Status: DC | PRN

## 2018-11-10 MED ORDER — BISACODYL 10 MG RE SUPP: 10.0000 mg | Freq: Every day | RECTAL | Status: DC | PRN

## 2018-11-10 MED ORDER — DIAZEPAM 5 MG PO TABS
ORAL_TABLET | ORAL | Status: AC
  Filled 2018-11-10: qty 1

## 2018-11-10 MED ORDER — SODIUM CHLORIDE 0.9 % IV SOLN
INTRAVENOUS | Status: DC | PRN
  Administered 2018-11-10: 25 ug/min via INTRAVENOUS

## 2018-11-10 MED ORDER — CEFAZOLIN SODIUM-DEXTROSE 2-4 GM/100ML-% IV SOLN
2.0000 g | INTRAVENOUS | Status: AC
  Administered 2018-11-10: 2 g via INTRAVENOUS
  Filled 2018-11-10: qty 100

## 2018-11-10 MED ORDER — HYDROMORPHONE HCL 1 MG/ML IJ SOLN
1.0000 mg | INTRAMUSCULAR | Status: DC | PRN
  Administered 2018-11-10: 1 mg via INTRAVENOUS
  Filled 2018-11-10: qty 1

## 2018-11-10 MED ORDER — SODIUM CHLORIDE 0.9% FLUSH
3.0000 mL | Freq: Two times a day (BID) | INTRAVENOUS | Status: DC
  Administered 2018-11-10: 3 mL via INTRAVENOUS

## 2018-11-10 MED ORDER — PRAVASTATIN SODIUM 10 MG PO TABS
10.0000 mg | ORAL_TABLET | Freq: Every day | ORAL | Status: DC
  Administered 2018-11-10: 10 mg via ORAL
  Filled 2018-11-10: qty 1

## 2018-11-10 MED ORDER — DEXAMETHASONE SODIUM PHOSPHATE 10 MG/ML IJ SOLN
INTRAMUSCULAR | Status: DC | PRN
  Administered 2018-11-10: 10 mg via INTRAVENOUS

## 2018-11-10 MED ORDER — THROMBIN 5000 UNITS EX SOLR
CUTANEOUS | Status: AC
  Filled 2018-11-10: qty 5000

## 2018-11-10 MED ORDER — SUGAMMADEX SODIUM 200 MG/2ML IV SOLN
INTRAVENOUS | Status: DC | PRN
  Administered 2018-11-10: 128.8 mg via INTRAVENOUS

## 2018-11-10 MED ORDER — ONDANSETRON HCL 4 MG/2ML IJ SOLN: 4.0000 mg | Freq: Four times a day (QID) | INTRAMUSCULAR | Status: DC | PRN

## 2018-11-10 MED ORDER — PROPOFOL 10 MG/ML IV BOLUS
INTRAVENOUS | Status: AC
  Filled 2018-11-10: qty 20

## 2018-11-10 MED ORDER — SODIUM CHLORIDE 0.9% FLUSH: 3.0000 mL | INTRAVENOUS | Status: DC | PRN

## 2018-11-10 MED ORDER — BUPIVACAINE HCL (PF) 0.25 % IJ SOLN
INTRAMUSCULAR | Status: DC | PRN
  Administered 2018-11-10: 20 mL

## 2018-11-10 MED ORDER — SODIUM CHLORIDE 0.9 % IV SOLN: INTRAVENOUS | Status: DC | PRN

## 2018-11-10 SURGICAL SUPPLY — 97 items
ANCHOR LUMBAR 27 (Anchor) ×6 IMPLANT
ANCHOR LUMBAR 27MM (Anchor) ×3 IMPLANT
APPLIER CLIP 11 MED OPEN (CLIP) ×3
BAG DECANTER FOR FLEXI CONT (MISCELLANEOUS) ×3 IMPLANT
BENZOIN TINCTURE PRP APPL 2/3 (GAUZE/BANDAGES/DRESSINGS) ×3 IMPLANT
BUR MATCHSTICK NEURO 3.0 LAGG (BURR) IMPLANT
CANISTER SUCT 3000ML PPV (MISCELLANEOUS) ×3 IMPLANT
CARTRIDGE OIL MAESTRO DRILL (MISCELLANEOUS) IMPLANT
CLIP APPLIE 11 MED OPEN (CLIP) ×1 IMPLANT
CLOSURE STERI-STRIP 1/2X4 (GAUZE/BANDAGES/DRESSINGS) ×1
CLOSURE WOUND 1/2 X4 (GAUZE/BANDAGES/DRESSINGS)
CLSR STERI-STRIP ANTIMIC 1/2X4 (GAUZE/BANDAGES/DRESSINGS) ×2 IMPLANT
COVER BACK TABLE 60X90IN (DRAPES) ×3 IMPLANT
COVER MAYO STAND STRL (DRAPES) ×3 IMPLANT
COVER WAND RF STERILE (DRAPES) ×3 IMPLANT
DERMABOND ADVANCED (GAUZE/BANDAGES/DRESSINGS) ×3
DERMABOND ADVANCED .7 DNX12 (GAUZE/BANDAGES/DRESSINGS) ×1 IMPLANT
DIFFUSER DRILL AIR PNEUMATIC (MISCELLANEOUS) IMPLANT
DRAPE C-ARM 42X72 X-RAY (DRAPES) ×3 IMPLANT
DRAPE C-ARMOR (DRAPES) ×3 IMPLANT
DRAPE INCISE IOBAN 66X45 STRL (DRAPES) IMPLANT
DRAPE LAPAROTOMY 100X72X124 (DRAPES) ×3 IMPLANT
DRSG OPSITE POSTOP 4X8 (GAUZE/BANDAGES/DRESSINGS) ×3 IMPLANT
ELECT BLADE 4.0 EZ CLEAN MEGAD (MISCELLANEOUS) ×3
ELECT BLADE 6.5 EXT (BLADE) IMPLANT
ELECT REM PT RETURN 9FT ADLT (ELECTROSURGICAL) ×3
ELECTRODE BLDE 4.0 EZ CLN MEGD (MISCELLANEOUS) ×1 IMPLANT
ELECTRODE REM PT RTRN 9FT ADLT (ELECTROSURGICAL) ×1 IMPLANT
GAUZE 4X4 16PLY RFD (DISPOSABLE) IMPLANT
GAUZE SPONGE 4X4 12PLY STRL (GAUZE/BANDAGES/DRESSINGS) IMPLANT
GLOVE BIO SURGEON STRL SZ 6.5 (GLOVE) ×2 IMPLANT
GLOVE BIO SURGEONS STRL SZ 6.5 (GLOVE) ×1
GLOVE BIOGEL PI IND STRL 6.5 (GLOVE) ×1 IMPLANT
GLOVE BIOGEL PI IND STRL 7.0 (GLOVE) ×2 IMPLANT
GLOVE BIOGEL PI IND STRL 7.5 (GLOVE) ×2 IMPLANT
GLOVE BIOGEL PI IND STRL 8 (GLOVE) ×2 IMPLANT
GLOVE BIOGEL PI INDICATOR 6.5 (GLOVE) ×2
GLOVE BIOGEL PI INDICATOR 7.0 (GLOVE) ×4
GLOVE BIOGEL PI INDICATOR 7.5 (GLOVE) ×4
GLOVE BIOGEL PI INDICATOR 8 (GLOVE) ×4
GLOVE ECLIPSE 7.5 STRL STRAW (GLOVE) ×3 IMPLANT
GLOVE ECLIPSE 9.0 STRL (GLOVE) ×3 IMPLANT
GLOVE EXAM NITRILE XL STR (GLOVE) IMPLANT
GLOVE SURG SS PI 7.0 STRL IVOR (GLOVE) ×15 IMPLANT
GOWN STRL REUS W/ TWL LRG LVL3 (GOWN DISPOSABLE) ×4 IMPLANT
GOWN STRL REUS W/ TWL XL LVL3 (GOWN DISPOSABLE) ×2 IMPLANT
GOWN STRL REUS W/TWL 2XL LVL3 (GOWN DISPOSABLE) IMPLANT
GOWN STRL REUS W/TWL LRG LVL3 (GOWN DISPOSABLE) ×12
GOWN STRL REUS W/TWL XL LVL3 (GOWN DISPOSABLE) ×6
INSERT FOGARTY 61MM (MISCELLANEOUS) IMPLANT
INSERT FOGARTY SM (MISCELLANEOUS) IMPLANT
KIT BASIN OR (CUSTOM PROCEDURE TRAY) ×3 IMPLANT
KIT INFUSE X SMALL 1.4CC (Orthopedic Implant) ×3 IMPLANT
KIT TURNOVER KIT B (KITS) ×3 IMPLANT
LOOP VESSEL MAXI BLUE (MISCELLANEOUS) IMPLANT
LOOP VESSEL MINI RED (MISCELLANEOUS) IMPLANT
NEEDLE HYPO 22GX1.5 SAFETY (NEEDLE) IMPLANT
NEEDLE SPNL 18GX3.5 QUINCKE PK (NEEDLE) ×3 IMPLANT
NS IRRIG 1000ML POUR BTL (IV SOLUTION) ×3 IMPLANT
OIL CARTRIDGE MAESTRO DRILL (MISCELLANEOUS)
PACK LAMINECTOMY NEURO (CUSTOM PROCEDURE TRAY) ×3 IMPLANT
PAD ARMBOARD 7.5X6 YLW CONV (MISCELLANEOUS) ×9 IMPLANT
PUTTY BONE DBX 5CC MIX (Putty) ×3 IMPLANT
SPACER HEDRON IA 29X39 15 15D (Spacer) ×3 IMPLANT
SPONGE INTESTINAL PEANUT (DISPOSABLE) ×9 IMPLANT
SPONGE LAP 18X18 X RAY DECT (DISPOSABLE) IMPLANT
SPONGE LAP 4X18 RFD (DISPOSABLE) IMPLANT
SPONGE SURGIFOAM ABS GEL 100 (HEMOSTASIS) ×3 IMPLANT
STAPLER VISISTAT (STAPLE) IMPLANT
STAPLER VISISTAT 35W (STAPLE) IMPLANT
STRIP CLOSURE SKIN 1/2X4 (GAUZE/BANDAGES/DRESSINGS) IMPLANT
SUT PROLENE 4 0 RB 1 (SUTURE)
SUT PROLENE 4-0 RB1 .5 CRCL 36 (SUTURE) IMPLANT
SUT PROLENE 5 0 CC1 (SUTURE) IMPLANT
SUT PROLENE 6 0 C 1 30 (SUTURE) IMPLANT
SUT PROLENE 6 0 CC (SUTURE) IMPLANT
SUT SILK 0 TIES 10X30 (SUTURE) IMPLANT
SUT SILK 2 0 TIES 10X30 (SUTURE) IMPLANT
SUT SILK 2 0SH CR/8 30 (SUTURE) IMPLANT
SUT SILK 3 0 SH CR/8 (SUTURE) IMPLANT
SUT SILK 3 0 TIES 10X30 (SUTURE) IMPLANT
SUT SILK 3 0SH CR/8 30 (SUTURE) IMPLANT
SUT VIC AB 0 CT1 18XCR BRD8 (SUTURE) ×1 IMPLANT
SUT VIC AB 0 CT1 27 (SUTURE) ×3
SUT VIC AB 0 CT1 27XBRD ANBCTR (SUTURE) ×1 IMPLANT
SUT VIC AB 0 CT1 27XBRD ANTBC (SUTURE) IMPLANT
SUT VIC AB 0 CT1 8-18 (SUTURE) ×3
SUT VIC AB 2-0 CT1 18 (SUTURE) IMPLANT
SUT VIC AB 2-0 CTB1 (SUTURE) IMPLANT
SUT VIC AB 3-0 SH 27 (SUTURE)
SUT VIC AB 3-0 SH 27X BRD (SUTURE) IMPLANT
SUT VIC AB 3-0 SH 8-18 (SUTURE) ×3 IMPLANT
SUT VICRYL 4-0 PS2 18IN ABS (SUTURE) IMPLANT
TOWEL GREEN STERILE (TOWEL DISPOSABLE) ×6 IMPLANT
TOWEL GREEN STERILE FF (TOWEL DISPOSABLE) ×3 IMPLANT
TRAY FOLEY MTR SLVR 16FR STAT (SET/KITS/TRAYS/PACK) ×3 IMPLANT
WATER STERILE IRR 1000ML POUR (IV SOLUTION) ×3 IMPLANT

## 2018-11-10 NOTE — H&P (Signed)
Kristen Strickland is an 73 y.o. female.   Chief Complaint: Back pain HPI: 73 year old female with chronic and progressive back pain with radiation to both lower extremities.  Symptoms consistent with L5 radicular pain bilaterally.  Symptoms worsen with standing and ambulation.  Work-up demonstrates evidence of marked disc degeneration with foraminal collapse and foraminal stenosis with some retrolisthesis at L5-S1.  Patient is failed conservative management presents now for L5-S1 decompression and fusion in hopes of improving her symptoms.  Past Medical History:  Diagnosis Date  . Arthritis   . Carotid arterial disease ([place])    a. 11/2017 Carotid U/S: RICA 9-92%, LICA 42-68%.   . Chronic fatigue   . Chronic leg pain   . DDD (degenerative disc disease), lumbar   . Dyspnea on exertion    a. 02/2017 Echo: EF 60-65%, no rwma, mild MR. Nl RV size/fxn. Nl PASP; b. 02/2017 MV: small, mild, fixed basal inferolateral defect - ? artifact vs scar. No ischemia. EF >65%.  . Hypercholesterolemia   . Hypertension   . Insomnia   . Palpitations    a. 02/2017 Holter: Avg HR 66 (51-115), rare PACs, four brief runs of PAT - up to 5 beats, max rate 157. Rare isolated PVCs. No sustained arrhythmias; b. 03/2018 24h Holter: Rare PACs, 3 beat run PAT (122 bpm), PVCs (2% of beats).  . Radiculopathy    Lumbar region  . Sinus bradycardia   . Spondylolisthesis    lumbosacral region    Past Surgical History:  Procedure Laterality Date  . ABDOMINAL HYSTERECTOMY  1975  . ANTERIOR CERVICAL DECOMP/DISCECTOMY FUSION N/A 04/09/2013   Procedure: ANTERIOR CERVICAL DECOMPRESSION/DISCECTOMY FUSION 2 LEVELS;  Surgeon: Floyce Stakes, MD;  Location: [facility] NEURO ORS;  Service: Neurosurgery;  Laterality: N/A;  Cervical five-six Cervical six-seven  Anterior cervical decompression/diskectomy/fusion  . BACK SURGERY     ruptured disc  . RECONSTRUCTION OF NOSE      Family History  Problem Relation Age of Onset  . Cancer Mother     ovarian/uterine  . Heart disease Mother   . Heart attack Mother   . Stroke Mother   . Colon cancer Father   . Dementia Father   . COPD Brother   . Congestive Heart Failure Brother   . Stroke Daughter   . Heart attack Daughter   . Breast cancer Neg Hx    Social History:  reports that she has never smoked. She has never used smokeless tobacco. She reports current alcohol use. She reports that she does not use drugs.  Allergies: No Known Allergies  Medications Prior to Admission  Medication Sig Dispense Refill  . amLODipine (NORVASC) 5 MG tablet Take 1 tablet (5 mg total) by mouth daily. 90 tablet 3  . aspirin EC 81 MG tablet Take 81 mg by mouth daily.    Marland Kitchen ibuprofen (ADVIL,MOTRIN) 200 MG tablet Take 200 mg by mouth at bedtime.    Marland Kitchen losartan-hydrochlorothiazide (HYZAAR) 100-25 MG tablet Take 1 tablet by mouth daily. 90 tablet 0  . Multiple Vitamins-Minerals (CENTRUM PO) Take 1 tablet by mouth daily.     Marland Kitchen omeprazole (PRILOSEC) 20 MG capsule TAKE (1) CAPSULE BY MOUTH TWICE DAILY FOR REFLUX (Patient taking differently: Take 20 mg by mouth daily. ) 180 capsule 0  . pravastatin (PRAVACHOL) 10 MG tablet Take 1 tablet (10 mg total) by mouth daily. 90 tablet 0  . traMADol (ULTRAM) 50 MG tablet Take 1 tablet (50 mg total) by mouth daily. 30 tablet 0  No results found for this or any previous visit (from the past 48 hour(s)). No results found.  Pertinent items noted in HPI and remainder of comprehensive ROS otherwise negative.  Blood pressure (!) 176/65, pulse 61, temperature 97.6 F (36.4 C), temperature source Oral, resp. rate 20, SpO2 99 %.  Patient is awake and alert.  She is oriented and appropriate.  Speech is fluent.  Judgment insight intact.  Cranial nerve function normal bilateral.  Motor examination upper extremities intact bilaterally sensory examination with mild distal sensory loss in both distal lower extremities.  Reflexes are hypoactive in both lower extremities with absent  Achilles.  No evidence of long track signs.  Gait is antalgic.  Posture is mildly flexed per examination head ears eyes nose throat is unremarkable her chest and abdomen are benign.  Extremities are free from injury or deformity. Assessment/Plan L5-S1 degenerative disc disease with degenerative retrolisthesis and foraminal stenosis.  Plan L5-S1 anterior lumbar interbody fusion utilizing interbody cage, morselized allograft, and anterior plate instrumentation.  Risks and benefits of been explained.  Patient wishes to proceed.  Cooper Render Mirl Hillery 11/10/2018, 7:44 AM

## 2018-11-10 NOTE — Op Note (Signed)
Date of procedure: 11/10/2018  Date of dictation: Same  Service: Neurosurgery  Preoperative diagnosis: L5-S1 degenerative retrolisthesis with severe foraminal stenosis  Postoperative diagnosis: Same  Procedure Name: L5-S1 anterior lumbar interbody decompression and fusion utilizing interbody titanium cage, morselized allograft, bone morphogenic protein, and anterior instrumentation  Surgeon:Tanashia Ciesla A.Laticha Ferrucci, M.D.  Asst. Surgeon: Montez Hageman, NP  Vascular surgeon: Scot Dock  Anesthesia: General  Indication: 73 year old female with progressive back and bilateral lower extremity symptoms failing conservative management her work-up demonstrates evidence of severe disc degeneration with disc space collapse and retrolisthesis with accompanying neural foraminal stenosis.  Patient is failed conservative management.  She presents now for anterior lumbar interbody fusion utilizing interbody cage and instrumentation.  Operative note: After induction of anesthesia, patient position supine.  Localizing fluoroscopy was used.  Lower abdomen was prepped and draped sterilely.  Dr. Scot Dock then performed a left-sided.  Midline incision and a retroperitoneal approach.  Self-retaining retractor was placed.  Great vessels were protected.  Localizing fluoroscopy was used and midline of the L5-S1 disc space was identified.  I then incised the disc space.  Discectomy was then performed using Kerrison rongeurs and various curettes.  Endplates were scraped free of all disc material.  The ligament was released both posteriorly and laterally.  The disc space is now much more mobile.  Osteophytes were undercut posteriorly.  The interspace was incised.  A 15 mm globus titanium cage with 15 degrees of lordosis was found to be most appropriate.  This was then packed with DBX putty and bone returning protein soaked sponge.  This was then impacted in the place.  Integral anterior stays were then impacted into the disc  space of L5 and S1.  Final images reveal good position of the cages at the proper upper level with normal alignment of the spine.  Wound is then irrigated one final time.  Wounds and closed in layers.  Steri-Strips and sterile dressing were applied.  No apparent complications.  Patient tolerated the procedure well and she returns to recovery room postop.

## 2018-11-10 NOTE — Evaluation (Signed)
Physical Therapy Evaluation Patient Details Name: Kristen Strickland MRN: [id number] DOB: 08-05-1946 Today's Date: 11/10/2018   History of Present Illness  L5-S1 anterior lumbar interbody decompression and fusion   Clinical Impression  Pt received in bed, feeling better than earlier in day with no dizziness, willing to participate in therapy. She reports no pain. She reports her home is a one story house with 2 steps with no rails to enter. Husband and daughter in room, report that one of them will be available to be home with her 24 hours/day. Pt's previous level of function is independent and "busy." Pt able to perform bed mobility with supervision, requiring multiple verbal cues for maintenance of back precautions, having the most difficulty remembering not to twist. She tended to be impulsive with her bed mobility and other movements. She performed sit to stand transfers with min guard for safety, and she ambulated 200 ft with min guard and 1 handheld assist for safety and balance. Pt able to ascend/descend 3 steps with no rails, min guard and 1 handheld assist. Due to pt's difficulty following her back precautions and decreased safety awareness, recommend continued skilled PT services to ensure that she is able to perform functional mobility safely.    Follow Up Recommendations Home health PT    Equipment Recommendations  None recommended by PT    Recommendations for Other Services       Precautions / Restrictions Precautions Precautions: Back Precaution Booklet Issued: Yes (comment) Required Braces or Orthoses: Spinal Brace Spinal Brace: Lumbar corset;Applied in sitting position Restrictions Weight Bearing Restrictions: No      Mobility  Bed Mobility Overal bed mobility: Needs Assistance Bed Mobility: Rolling;Supine to Sit;Sit to Supine Rolling: Supervision(requiring cues to prevent twisting) Sidelying to sit: Supervision Supine to sit: Supervision Sit to supine: Supervision    General bed mobility comments: verbal cues for technique  Transfers Overall transfer level: Needs assistance Equipment used: 1 person hand held assist(for safety, due to patient feeling shaky) Transfers: Sit to/from Stand Sit to Stand: Min guard         General transfer comment: min guard for safety  Ambulation/Gait Ambulation/Gait assistance: Min guard Gait Distance (Feet): 200 Feet Assistive device: 1 person hand held assist(for balance and safety due to patient feeling shaky) Gait Pattern/deviations: Step-through pattern;Decreased stride length Gait velocity: decreased   General Gait Details: Pt able to ambulate approximately 200 ft in hall with min guard, 1 hand held assist for balance and safety  Stairs Stairs: Yes Stairs assistance: Min guard Stair Management: No rails Number of Stairs: 3 General stair comments: Pt able to ascend/descend 3 steps with min guard for safety and balance.  Wheelchair Mobility    Modified Rankin (Stroke Patients Only)       Balance Overall balance assessment: Needs assistance Sitting-balance support: No upper extremity supported;Feet supported Sitting balance-Leahy Scale: Good     Standing balance support: Single extremity supported;During functional activity Standing balance-Leahy Scale: Fair                               Pertinent Vitals/Pain Pain Assessment: No/denies pain  Pain Intervention(s): Premedicated before session    [place] expects to be discharged to:: Private residence Living Arrangements: Spouse/significant other Available Help at Discharge: Family;Available 24 hours/day Type of Home: House Home Access: Stairs to enter Entrance Stairs-Rails: None Entrance Stairs-Number of Steps: 2 Home Layout: One level Home Equipment: Cane - quad  Prior Function Level of Independence: Independent               Hand Dominance   Dominant Hand: Right    Extremity/Trunk  Assessment   Upper Extremity Assessment Upper Extremity Assessment: Defer to OT evaluation    Lower Extremity Assessment Lower Extremity Assessment: Overall WFL for tasks assessed    Cervical / Trunk Assessment Cervical / Trunk Assessment: Normal  Communication   Communication: No difficulties  Cognition Arousal/Alertness: Awake/alert Behavior During Therapy: Impulsive(Pt required repeated cuing to maintain back precautions) Overall Cognitive Status: Within Functional Limits for tasks assessed                                        General Comments      Exercises     Assessment/Plan    PT Assessment Patient needs continued PT services  PT Problem List Decreased balance;Decreased knowledge of precautions;Pain;Decreased mobility;Decreased safety awareness       PT Treatment Interventions Functional mobility training;Balance training;Patient/family education;Gait training;Therapeutic activities;Stair training;Therapeutic exercise    PT Goals (Current goals can be found in the Care Plan section)  Acute Rehab PT Goals Patient Stated Goal: go home tomorrow PT Goal Formulation: With patient/family Time For Goal Achievement: 11/24/18 Potential to Achieve Goals: Good    Frequency Min 5X/week   Barriers to discharge        Co-evaluation               AM-PAC PT "6 Clicks" Mobility  Outcome Measure Help needed turning from your back to your side while in a flat bed without using bedrails?: A Little Help needed moving from lying on your back to sitting on the side of a flat bed without using bedrails?: A Little Help needed moving to and from a bed to a chair (including a wheelchair)?: A Little Help needed standing up from a chair using your arms (e.g., wheelchair or bedside chair)?: A Little Help needed to walk in hospital room?: A Little Help needed climbing 3-5 steps with a railing? : A Little 6 Click Score: 18    End of Session Equipment  Utilized During Treatment: Back brace Activity Tolerance: Patient tolerated treatment well Patient left: in bed;with family/visitor present;with SCD's reapplied;with call bell/phone within reach   PT Visit Diagnosis: Unsteadiness on feet (R26.81);Other abnormalities of gait and mobility (R26.89)    Time: 6151-8343 PT Time Calculation (min) (ACUTE ONLY): 22 min   Charges:              Ronnell Gu[place]ajara, SPT

## 2018-11-10 NOTE — Interval H&P Note (Signed)
History and Physical Interval Note:  11/10/2018 7:17 AM  Kristen Strickland  has presented today for surgery, with the diagnosis of Spondylolisthesis  The various methods of treatment have been discussed with the patient and family. After consideration of risks, benefits and other options for treatment, the patient has consented to  Procedure(s): ALIF - L5-S1 (N/A) ABDOMINAL EXPOSURE (N/A) as a surgical intervention .  The patient's history has been reviewed, patient examined, no change in status, stable for surgery.  I have reviewed the patient's chart and labs.  Questions were answered to the patient's satisfaction.     Deitra Mayo

## 2018-11-10 NOTE — Op Note (Signed)
    NAME: Kristen Strickland    MRN: [id number] DOB: February 06, 1946    DATE OF OPERATION: 11/10/2018  PREOP DIAGNOSIS:    Degenerative disc disease L5-S1  POSTOP DIAGNOSIS:    Same  PROCEDURE:    Anterior retroperitoneal exposure of L5-S1  EXPOSURE SURGEON: Judeth Cornfield. Scot Dock, MD, FACS  SPINE SURGEON: Earnie Larsson, MD  ANESTHESIA: General  EBL: Minimal  INDICATIONS:    Kristen Strickland is a 73 y.o. female with significant degenerative disc disease at L5-S1.  She was felt to be a good candidate for anterior lumbar interbody fusion.  I was asked to provide anterior retroperitoneal exposure of L5-S1  FINDINGS:   The left common iliac artery and external iliac artery was soft without significant disease.  TECHNIQUE:   The patient was taken to the operating room and received a general anesthetic.  The level of the L5-S1 disc space was marked under fluoroscopy.  The abdomen was prepped and draped in usual sterile fashion.  A transverse incision was made to the left of the midline at the marked level.  The incision was carried down to the anterior rectus fascia which was opened transversely.  Medially the incision was extended across the linea alba exposing a small amount of the right rectus abdominis muscle.  Laterally, the incision of the fascia was extended past the linea semilunaris.  The rectus abdominis muscle was then mobilized initially laterally and then medially.  The retroperitoneal space was entered laterally.  The dissection carried down to the psoas and then onto the iliac artery which was identified.  Using blunt dissection this was mobilized and then the retroperitoneum was retracted to the right and superiorly allowing exposure of the L5-S1 disc space.  The middle sacral vessels were identified and cauterized.  Blunt dissection was used to expose the L5-S1 disc space.  The Thompson retractor system was placed.  A reverse lip retractor was placed on the right side of the disc space  and then on the left side of the space.  Retractors were then placed superiorly and inferiorly.  The correct location was confirmed under fluoroscopy.  The remainder of the dictation is as per Dr. Annette Stable.   Deitra Mayo, MD, FACS [facility]  DATE OF DICTATION:   11/10/2018

## 2018-11-10 NOTE — Anesthesia Procedure Notes (Signed)
Procedure Name: Intubation Date/Time: 11/10/2018 8:00 AM Performed by: Alain Marion, CRNA Pre-anesthesia Checklist: Patient identified, Emergency Drugs available, Suction available and Patient being monitored Patient Re-evaluated:Patient Re-evaluated prior to induction Oxygen Delivery Method: Circle System Utilized Preoxygenation: Pre-oxygenation with 100% oxygen Induction Type: IV induction Ventilation: Mask ventilation without difficulty Laryngoscope Size: Miller and 2 Grade View: Grade I Tube type: Oral Tube size: 7.0 mm Number of attempts: 1 Airway Equipment and Method: Stylet and Oral airway Placement Confirmation: ETT inserted through vocal cords under direct vision,  positive ETCO2 and breath sounds checked- equal and bilateral Secured at: 21 cm Tube secured with: Tape Dental Injury: Teeth and Oropharynx as per pre-operative assessment

## 2018-11-10 NOTE — Progress Notes (Signed)
PT Cancellation Note  Patient Details Name: Kristen Strickland MRN: [id number] DOB: 10/21/1946   Cancelled Treatment:    Reason Eval/Treat Not Completed: Fatigue/lethargy limiting ability to participate attempted to see patient, RN reports she was given dilaudid and is very tired/light headed from medicine, requests PT return later in afternoon. Will attempt to return later today if time/schedule allow.    Deniece Ree PT, DPT, CBIS  Supplemental Physical Therapist [facility]    Pager [phone] Acute Rehab Office [phone]

## 2018-11-10 NOTE — Evaluation (Signed)
Occupational Therapy Evaluation and Discharge Patient Details Name: Kristen Strickland MRN: [id number] DOB: 04-12-1946 Today's Date: 11/10/2018    History of Present Illness L5-S1 anterior lumbar interbody decompression and fusion    Clinical Impression   This 73 yo female admitted and underwent above presents to acute OT with all education completed with pt, husband, and dtr and post op back handout out provided. We will D/C from acute OT.    Follow Up Recommendations  No OT follow up;Supervision/Assistance - 24 hour    Equipment Recommendations  None recommended by OT       Precautions / Restrictions Precautions Precautions: Back Precaution Booklet Issued: Yes (comment) Required Braces or Orthoses: Spinal Brace Spinal Brace: Lumbar corset;Applied in sitting position Restrictions Weight Bearing Restrictions: No      Mobility Bed Mobility Overal bed mobility: Needs Assistance Bed Mobility: Rolling;Sidelying to Sit Rolling: Supervision Sidelying to sit: Supervision       General bed mobility comments: VCs for technique  Transfers Overall transfer level: Needs assistance Equipment used: None Transfers: Sit to/from Stand Sit to Stand: Min assist         General transfer comment: Pt felt unsteady at first when getting up, but did better the more she walked advancing from pushing IV pole to walking on  her own as she ambulated down the hallway and back to her room         ADL either performed or assessed with clinical judgement   ADL                                         General ADL Comments: Educated pt and family on use of 2 cups for brushing teeth to avoid bending over sink, using wet wipes for back peri care to avoid as much twisting, limit sitting for 20-30 minutes then get up and walk around then can sit another 20-30 minutes, side stepping over tub, position to don socks, stance better for sit<>stand and keeping back straight, how do don  brace and when she needs to wear brace and when she can have it off.     Vision Patient Visual Report: No change from baseline              Pertinent Vitals/Pain Pain Assessment: 0-10 Pain Score: 8 ("not bad") Pain Location: incisional Pain Descriptors / Indicators: Aching;Sore Pain Intervention(s): Limited activity within patient's tolerance;Monitored during session     Hand Dominance Right   Extremity/Trunk Assessment Upper Extremity Assessment Upper Extremity Assessment: Overall WFL for tasks assessed           Communication Communication Communication: No difficulties   Cognition Arousal/Alertness: Awake/alert Behavior During Therapy: WFL for tasks assessed/performed Overall Cognitive Status: Within Functional Limits for tasks assessed                                                Home Living Family/patient expects to be discharged to:: Private residence Living Arrangements: Spouse/significant other Available Help at Discharge: Family;Available 24 hours/day Type of Home: House Home Access: Stairs to enter [employer] of Steps: 2 Entrance Stairs-Rails: None Home Layout: One level     Bathroom Shower/Tub: Tub/shower unit;Door   [employer] Toilet: Standard Bathroom Accessibility: No   Home Equipment: [employer] -  quad          Prior Functioning/Environment Level of Independence: Independent                 OT Problem List: Decreased strength;Decreased range of motion;Impaired balance (sitting and/or standing);Pain         OT Goals(Current goals can be found in the care plan section) Acute Rehab OT Goals Patient Stated Goal: home tomorrow  OT Frequency:                AM-PAC OT "6 Clicks" Daily Activity     Outcome Measure Help from another person eating meals?: None Help from another person taking care of personal grooming?: A Little Help from another person toileting, which includes using toliet, bedpan, or  urinal?: A Little Help from another person bathing (including washing, rinsing, drying)?: A Little Help from another person to put on and taking off regular upper body clothing?: A Little Help from another person to put on and taking off regular lower body clothing?: A Little 6 Click Score: 19   End of Session Equipment Utilized During Treatment: Gait belt;Back brace Nurse Communication: (No OT needs)  Activity Tolerance: Patient tolerated treatment well Patient left: in chair;with call bell/phone within reach;with family/visitor present  OT Visit Diagnosis: Unsteadiness on feet (R26.81);Other abnormalities of gait and mobility (R26.89);Pain Pain - part of body: (incisional)                Time: 1540-0867 OT Time Calculation (min): 13 min Charges:  OT General Charges $OT Visit: 1 Visit OT Evaluation $OT Eval Moderate Complexity: 1 Mod  [name], OTR/L Acute [employer] Pager [phone] Office [phone]     Almon Register 11/10/2018, 12:56 PM

## 2018-11-10 NOTE — Transfer of Care (Signed)
Immediate Anesthesia Transfer of Care Note  Patient: MIANNA IEZZI  Procedure(s) Performed: Anterior Lumbar Interbody Fusion-Lumbar five-Sacral one (N/A Spine Lumbar) ABDOMINAL EXPOSURE (N/A Spine Lumbar)  Patient Location: PACU  Anesthesia Type:General  Level of Consciousness: awake, alert  and oriented  Airway & Oxygen Therapy: Patient Spontanous Breathing and Patient connected to nasal cannula oxygen  Post-op Assessment: Report given to RN and Post -op Vital signs reviewed and stable  Post vital signs: Reviewed and stable  Last Vitals:  Vitals Value Taken Time  BP 139/63 11/10/2018  9:48 AM  Temp 36.4 C 11/10/2018  9:48 AM  Pulse 64 11/10/2018  9:52 AM  Resp 14 11/10/2018  9:52 AM  SpO2 93 % 11/10/2018  9:52 AM  Vitals shown include unvalidated device data.  Last Pain:  Vitals:   11/10/18 0652  TempSrc:   PainSc: 5       Patients Stated Pain Goal: 2 (94/17/40 8144)  Complications: No apparent anesthesia complications

## 2018-11-10 NOTE — Anesthesia Postprocedure Evaluation (Signed)
Anesthesia Post Note  Patient: Kristen Strickland  Procedure(s) Performed: Anterior Lumbar Interbody Fusion-Lumbar five-Sacral one (N/A Spine Lumbar) ABDOMINAL EXPOSURE (N/A Spine Lumbar)     Patient location during evaluation: PACU Anesthesia Type: General Level of consciousness: awake and alert Pain management: pain level controlled Vital Signs Assessment: post-procedure vital signs reviewed and stable Respiratory status: spontaneous breathing, nonlabored ventilation and respiratory function stable Cardiovascular status: blood pressure returned to baseline and stable Postop Assessment: no apparent nausea or vomiting Anesthetic complications: no    Last Vitals:  Vitals:   11/10/18 1024 11/10/18 1125  BP: 135/65 137/70  Pulse: 69 (!) 59  Resp: 12 18  Temp:    SpO2: 98% 97%                  Audry Pili

## 2018-11-10 NOTE — Brief Op Note (Signed)
11/10/2018  9:37 AM  PATIENT:  Kristen Strickland  73 y.o. female  PRE-OPERATIVE DIAGNOSIS:  Spondylolisthesis  POST-OPERATIVE DIAGNOSIS:  Spondylolisthesis  PROCEDURE:  Procedure(s): Anterior Lumbar Interbody Fusion-Lumbar five-Sacral one (N/A) ABDOMINAL EXPOSURE (N/A)  SURGEON:  Surgeon(s) and Role: Panel 1:    * Earnie Larsson, MD - Primary    * Ostergard, Joyice Faster, MD - Assisting Panel 2:    * Angelia Mould, MD - Primary  PHYSICIAN ASSISTANT:   ASSISTANTSMearl Latin   ANESTHESIA:   general  EBL:  50 mL   BLOOD ADMINISTERED:none  DRAINS: none   LOCAL MEDICATIONS USED:  MARCAINE     SPECIMEN:  No Specimen  DISPOSITION OF SPECIMEN:  N/A  COUNTS:  YES  TOURNIQUET:  * No tourniquets in log *  DICTATION: .Dragon Dictation  PLAN OF CARE: Admit to inpatient   PATIENT DISPOSITION:  PACU - hemodynamically stable.   Delay start of Pharmacological VTE agent (>24hrs) due to surgical blood loss or risk of bleeding: yes

## 2018-11-11 MED ORDER — CYCLOBENZAPRINE HCL 10 MG PO TABS: 10.0000 mg | ORAL_TABLET | Freq: Three times a day (TID) | ORAL | 0 refills | Status: DC | PRN

## 2018-11-11 MED ORDER — HYDROCODONE-ACETAMINOPHEN 10-325 MG PO TABS: 1.0000 | ORAL_TABLET | ORAL | 0 refills | Status: DC | PRN

## 2018-11-11 NOTE — Progress Notes (Signed)
Pt doing well. Pt and family given D/C instructions with verbal understanding. Pt's Rx's were sent to pharmacy by MD. Pt's incision is clean and dry with no sign of infection. Pt's IV was removed prior to D/C. Pt D/C'd home via wheelchair @ 1130 per MD order. Pt is stable @ D/C and has no other needs at this time. Holli Humbles, RN

## 2018-11-11 NOTE — Progress Notes (Signed)
   VASCULAR SURGERY ASSESSMENT & PLAN:   1 Day Post-Op s/p: Anterior retroperitoneal exposure of L5-S1.  Doing well.  Vascular surgery will be available as needed.  SUBJECTIVE:   Pain well controlled.  PHYSICAL EXAM:   Vitals:   11/10/18 1649 11/10/18 1928 11/10/18 2320 11/11/18 0358  BP: (!) 117/58 (!) 137/52 (!) 133/59 (!) 120/56  Pulse: 76 81 76 76  Resp: 16 18 18 18   Temp:  98.1 F (36.7 C) 98 F (36.7 C) 98.3 F (36.8 C)  TempSrc:  Oral Oral Oral  SpO2: 96% 95% 97% 95%   Palpable dorsalis pedis pulse.  LABS:   PROBLEM LIST:    Active Problems:   Degenerative spondylolisthesis  CURRENT MEDS:   . amLODipine  5 mg Oral Daily  . aspirin EC  81 mg Oral Daily  . hydrochlorothiazide  25 mg Oral Daily  . losartan  100 mg Oral Daily  . multivitamin with minerals  1 tablet Oral Daily  . pantoprazole  80 mg Oral Daily  . pravastatin  10 mg Oral q1800  . sodium chloride flush  3 mL Intravenous Q12H    Deitra Mayo Beeper: [phone] Office: [phone] 11/11/2018

## 2018-11-11 NOTE — Progress Notes (Signed)
Physical Therapy Treatment Patient Details Name: Kristen Strickland MRN: [id number] DOB: [birth year] Today's Date: 11/11/2018    History of Present Illness L5-S1 anterior lumbar interbody decompression and fusion     PT Comments    Pt received at door of room, looking for breakfast. She is willing to participate in therapy this morning. Education provided on brace management, including reminders to reset brace before application and to apply brace in sitting. Pt performs sit to/from stand transfers with Mod(I). She was able to ambulate approximately 400 ft with supervision for safety, and an increased gait speed, reporting she "always walked fast before her surgery or else the pain was too much." Pt educated on log rolling for bed mobility, and she demonstrated understanding by performing bed mobility with supervision. Pt educated on technique for getting in and out of car. Education provided on appropriate activity level and activity progression at home. Pt left sitting in chair with breakfast, call bell, and needs met.     Follow Up Recommendations  Home health PT     Equipment Recommendations  None recommended by PT    Recommendations for Other Services       Precautions / Restrictions Precautions Precautions: Back Precaution Booklet Issued: Yes (comment) Required Braces or Orthoses: Spinal Brace Spinal Brace: Lumbar corset;Applied in sitting position Restrictions Weight Bearing Restrictions: No    Mobility  Bed Mobility Overal bed mobility: Needs Assistance Bed Mobility: Supine to Sit;Sit to Supine;Rolling Rolling: Supervision(verbal cues for log roll)   Supine to sit: Supervision Sit to supine: Supervision   General bed mobility comments: verbal cues for log roll  Transfers Overall transfer level: Modified independent Equipment used: None Transfers: Sit to/from Stand Sit to Stand: Modified independent (Device/Increase time)         General transfer comment: Pt  able to perform sit to/from stand mod(I) with correct hand placement on bed, without excess bending  Ambulation/Gait Ambulation/Gait assistance: Supervision Gait Distance (Feet): 400 Feet Assistive device: None Gait Pattern/deviations: Step-through pattern Gait velocity: quick- pt cued that she doesn't have to walk so quickly for safety   General Gait Details: Pt able to ambulate in hall approximately 400 ft with supervision for safety due to her increased gait speed; observed heel-toe pattern and increased care taken with turns   Stairs             Wheelchair Mobility    Modified Rankin (Stroke Patients Only)       Balance Overall balance assessment: Modified Independent   Sitting balance-Leahy Scale: Normal       Standing balance-Leahy Scale: Good                              Cognition Arousal/Alertness: Awake/alert Behavior During Therapy: WFL for tasks assessed/performed Overall Cognitive Status: Within Functional Limits for tasks assessed                                        Exercises      General Comments        Pertinent Vitals/Pain Pain Assessment: No/denies pain Pain Intervention(s): Premedicated before session    Home Living                      Prior Function            PT Goals (current  goals can now be found in the care plan section) Acute Rehab PT Goals Patient Stated Goal: go home PT Goal Formulation: With patient/family Time For Goal Achievement: 11/24/18 Potential to Achieve Goals: Good Progress towards PT goals: Progressing toward goals    Frequency    Min 5X/week      PT Plan Current plan remains appropriate    Co-evaluation              AM-PAC PT "6 Clicks" Mobility   Outcome Measure  Help needed turning from your back to your side while in a flat bed without using bedrails?: None Help needed moving from lying on your back to sitting on the side of a flat bed without  using bedrails?: None Help needed moving to and from a bed to a chair (including a wheelchair)?: None Help needed standing up from a chair using your arms (e.g., wheelchair or bedside chair)?: None Help needed to walk in hospital room?: None Help needed climbing 3-5 steps with a railing? : A Little 6 Click Score: 23    End of Session Equipment Utilized During Treatment: Back brace Activity Tolerance: Patient tolerated treatment well Patient left: in chair;with call bell/phone within reach;with family/visitor present   PT Visit Diagnosis: Unsteadiness on feet (R26.81);Other abnormalities of gait and mobility (R26.89)     Time: 4944-9675 PT Time Calculation (min) (ACUTE ONLY): 24 min  Charges:  $Gait Training: 23-37 mins                     Ronnell Guadalajara, SPT

## 2018-11-11 NOTE — Discharge Instructions (Signed)

## 2018-11-11 NOTE — Discharge Summary (Signed)
Physician Discharge Summary  Patient ID: Kristen Strickland MRN: [id number] DOB/AGE: [birth year] 73 y.o.  Admit date: 11/10/2018 Discharge date: 11/11/2018  Admission Diagnoses:  Discharge Diagnoses:  Active Problems:   Degenerative spondylolisthesis   Discharged Condition: good  Hospital Course: Patient been to the hospital where she underwent uncomplicated W8-G8 anterior lumbar interbody fusion.  Postop Truman Hayward doing very well.  Preoperative back and lower extremity pain much improved.  Standing and walking without difficulty.  Ready for discharge home.  Consults:   Significant Diagnostic Studies:   Treatments:   Discharge Exam: Blood pressure (!) 127/49, pulse 72, temperature 99.2 F (37.3 C), temperature source Oral, resp. rate 16, SpO2 97 %. Awake and alert.  Oriented and appropriate.  Motor and sensory function extremities intact.  Abdomen soft.  Wound clean and dry.  Chest and abdomen benign.  Disposition: Discharge disposition: 01-Home or Self Care        Allergies as of 11/11/2018   No Known Allergies     Medication List    TAKE these medications   amLODipine 5 MG tablet Commonly known as:  NORVASC Take 1 tablet (5 mg total) by mouth daily.   aspirin EC 81 MG tablet Take 81 mg by mouth daily.   CENTRUM PO Take 1 tablet by mouth daily.   cyclobenzaprine 10 MG tablet Commonly known as:  FLEXERIL Take 1 tablet (10 mg total) by mouth 3 (three) times daily as needed for muscle spasms.   HYDROcodone-acetaminophen 10-325 MG tablet Commonly known as:  NORCO Take 1 tablet by mouth every 4 (four) hours as needed for moderate pain ((score 4 to 6)).   ibuprofen 200 MG tablet Commonly known as:  ADVIL,MOTRIN Take 200 mg by mouth at bedtime.   losartan-hydrochlorothiazide 100-25 MG tablet Commonly known as:  HYZAAR Take 1 tablet by mouth daily.   omeprazole 20 MG capsule Commonly known as:  PRILOSEC TAKE (1) CAPSULE BY MOUTH TWICE DAILY FOR REFLUX What changed:     how much to take  how to take this  when to take this  additional instructions   pravastatin 10 MG tablet Commonly known as:  PRAVACHOL Take 1 tablet (10 mg total) by mouth daily.   traMADol 50 MG tablet Commonly known as:  ULTRAM Take 1 tablet (50 mg total) by mouth daily.            Durable Medical Equipment  (From admission, onward)         Start     Ordered   11/10/18 1127  DME Walker rolling  Once    Question:  Patient needs a walker to treat with the following condition  Answer:  Degenerative spondylolisthesis   11/10/18 1126   11/10/18 1127  DME 3 n 1  Once     11/10/18 1126           Signed: Mallie Mussel A Ugochukwu [place] 11/11/2018, 9:40 AM

## 2018-11-12 ENCOUNTER — Encounter (HOSPITAL_COMMUNITY): Payer: Self-pay | Admitting: Neurosurgery

## 2018-11-12 MED FILL — Sodium Chloride IV Soln 0.9%: INTRAVENOUS | Qty: 1000 | Status: AC

## 2018-11-12 MED FILL — Heparin Sodium (Porcine) Inj 1000 Unit/ML: INTRAMUSCULAR | Qty: 30 | Status: AC

## 2018-12-17 ENCOUNTER — Ambulatory Visit (INDEPENDENT_AMBULATORY_CARE_PROVIDER_SITE_OTHER): Payer: Medicare Other | Admitting: Vascular Surgery

## 2018-12-17 ENCOUNTER — Encounter (INDEPENDENT_AMBULATORY_CARE_PROVIDER_SITE_OTHER): Payer: Medicare Other

## 2018-12-17 DIAGNOSIS — M4317 Spondylolisthesis, lumbosacral region: Secondary | ICD-10-CM | POA: Diagnosis not present

## 2018-12-17 DIAGNOSIS — I10 Essential (primary) hypertension: Secondary | ICD-10-CM | POA: Diagnosis not present

## 2018-12-24 ENCOUNTER — Encounter (INDEPENDENT_AMBULATORY_CARE_PROVIDER_SITE_OTHER): Payer: Self-pay | Admitting: Nurse Practitioner

## 2018-12-24 ENCOUNTER — Ambulatory Visit (INDEPENDENT_AMBULATORY_CARE_PROVIDER_SITE_OTHER): Payer: Medicare Other | Admitting: Nurse Practitioner

## 2018-12-24 ENCOUNTER — Other Ambulatory Visit: Payer: Self-pay

## 2018-12-24 ENCOUNTER — Ambulatory Visit (INDEPENDENT_AMBULATORY_CARE_PROVIDER_SITE_OTHER): Payer: Medicare Other

## 2018-12-24 VITALS — BP 110/63 | HR 69 | Resp 10 | Ht 64.0 in | Wt 146.0 lb

## 2018-12-24 DIAGNOSIS — Z7902 Long term (current) use of antithrombotics/antiplatelets: Secondary | ICD-10-CM

## 2018-12-24 DIAGNOSIS — Z79899 Other long term (current) drug therapy: Secondary | ICD-10-CM

## 2018-12-24 DIAGNOSIS — E7800 Pure hypercholesterolemia, unspecified: Secondary | ICD-10-CM | POA: Diagnosis not present

## 2018-12-24 DIAGNOSIS — I10 Essential (primary) hypertension: Secondary | ICD-10-CM

## 2018-12-24 DIAGNOSIS — I6523 Occlusion and stenosis of bilateral carotid arteries: Secondary | ICD-10-CM | POA: Diagnosis not present

## 2018-12-24 DIAGNOSIS — Z7982 Long term (current) use of aspirin: Secondary | ICD-10-CM

## 2018-12-24 NOTE — Progress Notes (Signed)
SUBJECTIVE:  Patient ID: Kristen Strickland, female    DOB: 12-04-1945, 73 y.o.   MRN: [id number] Chief Complaint  Patient presents with  . Follow-up    HPI  Kristen Strickland is a 73 y.o. female The patient is seen for follow up evaluation of carotid stenosis. The carotid stenosis followed by ultrasound.   The patient denies amaurosis fugax. There is no recent history of TIA symptoms or focal motor deficits. There is no prior documented CVA.  The patient is taking enteric-coated aspirin 81 mg daily.  There is no history of migraine headaches. There is no history of seizures.  The patient has a history of coronary artery disease, no recent episodes of angina or shortness of breath. The patient denies PAD or claudication symptoms. There is a history of hyperlipidemia which is being treated with a statin.    Carotid Duplex done today shows velocities in the right internal carotid artery consistent with a 1 to 39% stenosis.  The left internal carotid artery velocities are consistent with a 60 to 79% stenosis.  This is a change with the study done on 12/08/2017.  Past Medical History:  Diagnosis Date  . Arthritis   . Carotid arterial disease ([place])    a. 11/2017 Carotid U/S: RICA 6-29%, LICA 52-84%.   . Chronic fatigue   . Chronic leg pain   . DDD (degenerative disc disease), lumbar   . Dyspnea on exertion    a. 02/2017 Echo: EF 60-65%, no rwma, mild MR. Nl RV size/fxn. Nl PASP; b. 02/2017 MV: small, mild, fixed basal inferolateral defect - ? artifact vs scar. No ischemia. EF >65%.  . Hypercholesterolemia   . Hypertension   . Insomnia   . Palpitations    a. 02/2017 Holter: Avg HR 66 (51-115), rare PACs, four brief runs of PAT - up to 5 beats, max rate 157. Rare isolated PVCs. No sustained arrhythmias; b. 03/2018 24h Holter: Rare PACs, 3 beat run PAT (122 bpm), PVCs (2% of beats).  . Radiculopathy    Lumbar region  . Sinus bradycardia   . Spondylolisthesis    lumbosacral region    Past  Surgical History:  Procedure Laterality Date  . ABDOMINAL EXPOSURE N/A 11/10/2018   Procedure: ABDOMINAL EXPOSURE;  Surgeon: Angelia Mould, MD;  Location: [facility];  Service: Vascular;  Laterality: N/A;  . ABDOMINAL HYSTERECTOMY  1975  . ANTERIOR CERVICAL DECOMP/DISCECTOMY FUSION N/A 04/09/2013   Procedure: ANTERIOR CERVICAL DECOMPRESSION/DISCECTOMY FUSION 2 LEVELS;  Surgeon: Floyce Stakes, MD;  Location: [facility];  Service: Neurosurgery;  Laterality: N/A;  Cervical five-six Cervical six-seven  Anterior cervical decompression/diskectomy/fusion  . ANTERIOR LUMBAR FUSION N/A 11/10/2018   Procedure: Anterior Lumbar Interbody Fusion-Lumbar five-Sacral one;  Surgeon: Earnie Larsson, MD;  Location: [facility];  Service: Neurosurgery;  Laterality: N/A;  . BACK SURGERY     ruptured disc  . RECONSTRUCTION OF NOSE      Social History   Socioeconomic History  . Marital status: Married    Spouse name: Not on file  . Number of children: 2  . Years of education: Not on file  . Highest education level: Not on file  Occupational History  . Not on file  Social Needs  . Financial resource strain: Not hard at all  . Food insecurity:    Worry: Never true    Inability: Never true  . Transportation needs:    Medical: No    Non-medical: No  Tobacco Use  .  Smoking status: Never Smoker  . Smokeless tobacco: Never Used  Substance and Sexual Activity  . Alcohol use: Yes    Alcohol/week: 0.0 standard drinks    Comment: rarely  . Drug use: No  . Sexual activity: Yes  Lifestyle  . Physical activity:    Days per week: Not on file    Minutes per session: Not on file  . Stress: Not on file  Relationships  . Social connections:    Talks on phone: Not on file    Gets together: Not on file    Attends religious service: Not on file    Active member of club or organization: Not on file    Attends meetings of clubs or organizations: Not on file    Relationship status: Not on file  . Intimate partner  violence:    Fear of current or ex partner: No    Emotionally abused: No    Physically abused: No    Forced sexual activity: No  Other Topics Concern  . Not on file  Social History Narrative  . Not on file    Family History  Problem Relation Age of Onset  . Cancer Mother        ovarian/uterine  . Heart disease Mother   . Heart attack Mother   . Stroke Mother   . Colon cancer Father   . Dementia Father   . COPD Brother   . Congestive Heart Failure Brother   . Stroke Daughter   . Heart attack Daughter   . Breast cancer Neg Hx     No Known Allergies   Review of Systems   Review of Systems: Negative Unless Checked Constitutional: [] Weight loss  [] Fever  [] Chills Cardiac: [] Chest pain   []  Atrial Fibrillation  [] Palpitations   [] Shortness of breath when laying flat   [x] Shortness of breath with exertion. [] Shortness of breath at rest Vascular:  [] Pain in legs with walking   [] Pain in legs with standing [] Pain in legs when laying flat   [] Claudication    [] Pain in feet when laying flat    [] History of DVT   [] Phlebitis   [] Swelling in legs   [] Varicose veins   [] Non-healing ulcers Pulmonary:   [] Uses home oxygen   [] Productive cough   [] Hemoptysis   [] Wheeze  [] COPD   [] Asthma Neurologic:  [x] Dizziness   [] Seizures  [] Blackouts [] History of stroke   [] History of TIA  [] Aphasia   [] Temporary Blindness   [] Weakness or numbness in arm   [] Weakness or numbness in leg Musculoskeletal:   [] Joint swelling   [] Joint pain   [] Low back pain  []  History of Knee Replacement [x] Arthritis [] back Surgeries  []  Spinal Stenosis    Hematologic:  [] Easy bruising  [] Easy bleeding   [] Hypercoagulable state   [] Anemic Gastrointestinal:  [] Diarrhea   [] Vomiting  [] Gastroesophageal reflux/heartburn   [] Difficulty swallowing. [] Abdominal pain Genitourinary:  [] Chronic kidney disease   [] Difficult urination  [] Anuric   [] Blood in urine [] Frequent urination  [] Burning with urination   [] Hematuria Skin:   [] Rashes   [] Ulcers [] Wounds Psychological:  [] History of anxiety   []  History of major depression  []  Memory Difficulties      OBJECTIVE:   Physical Exam  BP 110/63 (BP Location: Left Arm, Patient Position: Sitting, Cuff Size: Small)   Pulse 69   Resp 10   Ht 5\' 4"  (1.626 m)   Wt 146 lb (66.2 kg)   BMI 25.06 kg/m   Gen: WD/WN, NAD  Head: [place]/AT, No temporalis wasting.  Ear/Nose/Throat: Hearing grossly intact, nares w/o erythema or drainage Eyes: PER, EOMI, sclera nonicteric.  Neck: Supple, no masses.  No JVD.  Pulmonary:  Good air movement, no use of accessory muscles.  Cardiac: RRR Vascular:  No carotid bruit auscultated Vessel Right Left  Radial Palpable Palpable   Gastrointestinal: soft, non-distended. No guarding/no peritoneal signs.  Musculoskeletal: M/S 5/5 throughout.  No deformity or atrophy.  Neurologic: Pain and light touch intact in extremities.  Symmetrical.  Speech is fluent. Motor exam as listed above. Psychiatric: Judgment intact, Mood & affect appropriate for pt's clinical situation. Dermatologic: No Venous rashes. No Ulcers Noted.  No changes consistent with cellulitis. Lymph : No Cervical lymphadenopathy, no lichenification or skin changes of chronic lymphedema.       ASSESSMENT AND PLAN:  1. Bilateral carotid artery stenosis Recommend:  Given the patient's asymptomatic subcritical stenosis no further invasive testing or surgery at this time.  Carotid Duplex done today shows velocities in the right internal carotid artery consistent with a 1 to 39% stenosis.  The left internal carotid artery velocities are consistent with a 60 to 79% stenosis.  This is a change with the study done on 12/08/2017.  Continue antiplatelet therapy as prescribed Continue management of CAD, HTN and Hyperlipidemia Healthy heart diet,  encouraged exercise at least 4 times per week Follow up in 6 months with duplex ultrasound and physical exam  - VAS US CAROTID; Future  2.  Essential hypertension Continue antihypertensive medications as already ordered, these medications have been reviewed and there are no changes at this time.   3. Hypercholesteremia Continue statin as ordered and reviewed, no changes at this time    Current Outpatient Medications on File Prior to Visit  Medication Sig Dispense Refill  . amLODipine (NORVASC) 5 MG tablet Take 1 tablet (5 mg total) by mouth daily. 90 tablet 3  . aspirin EC 81 MG tablet Take 81 mg by mouth daily.    Marland Kitchen losartan-hydrochlorothiazide (HYZAAR) 100-25 MG tablet Take 1 tablet by mouth daily. 90 tablet 0  . Multiple Vitamins-Minerals (CENTRUM PO) Take 1 tablet by mouth daily.     Marland Kitchen omeprazole (PRILOSEC) 20 MG capsule TAKE (1) CAPSULE BY MOUTH TWICE DAILY FOR REFLUX (Patient taking differently: Take 20 mg by mouth daily. ) 180 capsule 0  . pravastatin (PRAVACHOL) 10 MG tablet Take 1 tablet (10 mg total) by mouth daily. 90 tablet 0  . cyclobenzaprine (FLEXERIL) 10 MG tablet Take 1 tablet (10 mg total) by mouth 3 (three) times daily as needed for muscle spasms. (Patient not taking: Reported on 12/24/2018) 30 tablet 0  . HYDROcodone-acetaminophen (NORCO) 10-325 MG tablet Take 1 tablet by mouth every 4 (four) hours as needed for moderate pain ((score 4 to 6)). (Patient not taking: Reported on 12/24/2018) 30 tablet 0  . ibuprofen (ADVIL,MOTRIN) 200 MG tablet Take 200 mg by mouth at bedtime.    . traMADol (ULTRAM) 50 MG tablet Take 1 tablet (50 mg total) by mouth daily. (Patient not taking: Reported on 12/24/2018) 30 tablet 0   No current facility-administered medications on file prior to visit.     There are no Patient Instructions on file for this visit. No follow-ups on file.   Kris Hartmann, NP  This note was completed with Sales executive.  Any errors are purely unintentional.

## 2019-01-15 ENCOUNTER — Other Ambulatory Visit: Payer: Medicare Other

## 2019-01-18 ENCOUNTER — Ambulatory Visit: Payer: Medicare Other | Admitting: Internal Medicine

## 2019-01-20 ENCOUNTER — Telehealth: Payer: Self-pay | Admitting: Internal Medicine

## 2019-01-20 NOTE — Telephone Encounter (Signed)
Virtual Visit Pre-Appointment Phone Call  Steps For Call:  1. Confirm consent - "In the setting of the current Covid19 crisis, you are scheduled for a (phone or video) visit with your provider on (date) at (time).  Just as we do with many in-office visits, in order for you to participate in this visit, we must obtain consent.  If you'd like, I can send this to your mychart (if signed up) or email for you to review.  Otherwise, I can obtain your verbal consent now.  All virtual visits are billed to your insurance company just like a normal visit would be.  By agreeing to a virtual visit, we'd like you to understand that the technology does not allow for your provider to perform an examination, and thus may limit your provider's ability to fully assess your condition.  Finally, though the technology is pretty good, we cannot assure that it will always work on either your or our end, and in the setting of a video visit, we may have to convert it to a phone-only visit.  In either situation, we cannot ensure that we have a secure connection.  Are you willing to proceed?"  2. Give patient instructions for WebEx download to smartphone as below if video visit  3. Advise patient to be prepared with any vital sign or heart rhythm information, their current medicines, and a piece of paper and pen handy for any instructions they may receive the day of their visit  4. Inform patient they will receive a phone call 15 minutes prior to their appointment time (may be from unknown caller ID) so they should be prepared to answer  5. Confirm that appointment type is correct in Epic appointment notes (video vs telephone)    TELEPHONE CALL NOTE  Kristen Strickland has been deemed a candidate for a follow-up tele-health visit to limit community exposure during the Covid-19 pandemic. I spoke with the patient via phone to ensure availability of phone/video source, confirm preferred email & phone number, and discuss  instructions and expectations.  I reminded Kristen Strickland to be prepared with any vital sign and/or heart rhythm information that could potentially be obtained via home monitoring, at the time of her visit. I reminded Kristen Strickland to expect a phone call at the time of her visit if her visit.  Did the patient verbally acknowledge consent to treatment? Yes   [name] 01/20/2019 3:33 PM   DOWNLOADING THE [place], go to [employer] and type in WebEx in the search bar. [place] [employer], the blue/green circle. The app is free but as with any other app downloads, their phone may require them to verify saved payment information or Apple password. The patient does NOT have to create an account.  - If Android, ask patient to go to [employer] and type in WebEx in the search bar. [place] [employer], the blue/green circle. The app is free but as with any other app downloads, their phone may require them to verify saved payment information or Android password. The patient does NOT have to create an account.   CONSENT FOR TELE-HEALTH VISIT - PLEASE REVIEW  I hereby voluntarily request, consent and authorize [facility] and its employed or contracted physicians, physician assistants, nurse practitioners or other licensed health care professionals (the Practitioner), to provide me with telemedicine health care services (the "Services") as deemed necessary by the treating Practitioner. I  acknowledge and consent to receive the Services by the Practitioner via telemedicine. I understand that the telemedicine visit will involve communicating with the Practitioner through live audiovisual communication technology and the disclosure of certain medical information by electronic transmission. I acknowledge that I have been given the opportunity to request an in-person assessment or other available alternative prior to the telemedicine visit and am  voluntarily participating in the telemedicine visit.  I understand that I have the right to withhold or withdraw my consent to the use of telemedicine in the course of my care at any time, without affecting my right to future care or treatment, and that the Practitioner or I may terminate the telemedicine visit at any time. I understand that I have the right to inspect all information obtained and/or recorded in the course of the telemedicine visit and may receive copies of available information for a reasonable fee.  I understand that some of the potential risks of receiving the Services via telemedicine include:  Marland Kitchen Delay or interruption in medical evaluation due to technological equipment failure or disruption; . Information transmitted may not be sufficient (e.g. poor resolution of images) to allow for appropriate medical decision making by the Practitioner; and/or  . In rare instances, security protocols could fail, causing a breach of personal health information.  Furthermore, I acknowledge that it is my responsibility to provide information about my medical history, conditions and care that is complete and accurate to the best of my ability. I acknowledge that Practitioner's advice, recommendations, and/or decision may be based on factors not within their control, such as incomplete or inaccurate data provided by me or distortions of diagnostic images or specimens that may result from electronic transmissions. I understand that the practice of medicine is not an exact science and that Practitioner makes no warranties or guarantees regarding treatment outcomes. I acknowledge that I will receive a copy of this consent concurrently upon execution via email to the email address I last provided but may also request a printed copy by calling the office of [place].    I understand that my insurance will be billed for this visit.   I have read or had this consent read to me. . I understand the  contents of this consent, which adequately explains the benefits and risks of the Services being provided via telemedicine.  . I have been provided ample opportunity to ask questions regarding this consent and the Services and have had my questions answered to my satisfaction. . I give my informed consent for the services to be provided through the use of telemedicine in my medical care  By participating in this telemedicine visit I agree to the above.

## 2019-01-26 NOTE — Progress Notes (Signed)
Virtual Visit via Telephone Note   This visit type was conducted due to national recommendations for restrictions regarding the COVID-19 Pandemic (e.g. social distancing) in an effort to limit this patient's exposure and mitigate transmission in our community.  Due to her co-morbid illnesses, this patient is at least at moderate risk for complications without adequate follow up.  This format is felt to be most appropriate for this patient at this time.  The patient did not have access to video technology/had technical difficulties with video requiring transitioning to audio format only (telephone).  All issues noted in this document were discussed and addressed.  No physical exam could be performed with this format. Verbal consent was obtained from the patient.  Evaluation Performed:  Follow-up visit  Date:  01/27/2019   ID:  Kristen Strickland [id number], MRN [id number]  Patient Location: Home  Provider Location: Office  PCP:  Einar Pheasant, MD  Cardiologist:  Nelva Bush, MD  Electrophysiologist:  None   Chief Complaint: Palpitations and leg swelling  History of Present Illness:    Kristen Strickland is a 73 y.o. female who presents via audio/video conferencing for a telehealth visit today.  She has a history of hypertension, hyperlipidemia, carotid artery stenosis, and degenerative disc disease.  She was last seen in our office in January for preoperative cardiovascular risk assessment in anticipation of spine surgery.  Uncontrolled hypertension was noted at that visit as well as at preceding vascular surgery consultation.  Amlodipine was therefore increased to 5 mg daily with continuation of losartan-HCTZ 100/25.  Subsequent readings in our system (most recently 12/24/2018) show marked improvement in blood pressure; BP was 110/63 at that time.  Today, Kristen Strickland reports that she is feeling relatively well.  She had back surgery almost 3 months ago and has recovered well from this.  She  still has some abdominal discomfort, most pronounced when she tries to void.  She has also experienced increased leg swelling following her surgery, left greater than right.  She notes a long history of dependent edema more pronounced on the left.  She also has occasional cramping in the left leg but no erythema or warmth.  The swelling seems to resolve overnight when she elevates her legs.  Kristen Strickland denies chest pain, shortness of breath, and lightheadedness.  She notes sporadic palpitations, most frequently at night.  She describes them as a few skipped beats.  Overall frequency seems to be a little bit worse than in the past.  There are no associated symptoms.  Kristen Strickland reports that she is tolerating her blood pressure medications well.  She does not routinely check her blood pressure at home.  She has experienced some aches and pains, particularly in the joints of her legs and hands.  She wonders if this could be due to 1 of her medications.  The patient does not have symptoms concerning for COVID-19 infection (fever, chills, cough, or new shortness of breath).    Past Medical History:  Diagnosis Date  . Arthritis   . Carotid arterial disease ([place])    a. 11/2017 Carotid U/S: RICA 2-12%, LICA 24-82%.   . Chronic fatigue   . Chronic leg pain   . DDD (degenerative disc disease), lumbar   . Dyspnea on exertion    a. 02/2017 Echo: EF 60-65%, no rwma, mild MR. Nl RV size/fxn. Nl PASP; b. 02/2017 MV: small, mild, fixed basal inferolateral defect - ? artifact vs scar. No ischemia. EF >65%.  Marland Kitchen  Hypercholesterolemia   . Hypertension   . Insomnia   . Palpitations    a. 02/2017 Holter: Avg HR 66 (51-115), rare PACs, four brief runs of PAT - up to 5 beats, max rate 157. Rare isolated PVCs. No sustained arrhythmias; b. 03/2018 24h Holter: Rare PACs, 3 beat run PAT (122 bpm), PVCs (2% of beats).  . Radiculopathy    Lumbar region  . Sinus bradycardia   . Spondylolisthesis    lumbosacral region   Past  Surgical History:  Procedure Laterality Date  . ABDOMINAL EXPOSURE N/A 11/10/2018   Procedure: ABDOMINAL EXPOSURE;  Surgeon: Angelia Mould, MD;  Location: [place];  Service: Vascular;  Laterality: N/A;  . ABDOMINAL HYSTERECTOMY  1975  . ANTERIOR CERVICAL DECOMP/DISCECTOMY FUSION N/A 04/09/2013   Procedure: ANTERIOR CERVICAL DECOMPRESSION/DISCECTOMY FUSION 2 LEVELS;  Surgeon: Floyce Stakes, MD;  Location: [facility];  Service: Neurosurgery;  Laterality: N/A;  Cervical five-six Cervical six-seven  Anterior cervical decompression/diskectomy/fusion  . ANTERIOR LUMBAR FUSION N/A 11/10/2018   Procedure: Anterior Lumbar Interbody Fusion-Lumbar five-Sacral one;  Surgeon: Earnie Larsson, MD;  Location: [place];  Service: Neurosurgery;  Laterality: N/A;  . BACK SURGERY     ruptured disc  . RECONSTRUCTION OF NOSE       Current Meds  Medication Sig  . amLODipine (NORVASC) 5 MG tablet Take 1 tablet (5 mg total) by mouth daily.  Marland Kitchen aspirin EC 81 MG tablet Take 81 mg by mouth daily.  . cyclobenzaprine (FLEXERIL) 10 MG tablet Take 1 tablet (10 mg total) by mouth 3 (three) times daily as needed for muscle spasms.  Marland Kitchen losartan-hydrochlorothiazide (HYZAAR) 100-25 MG tablet Take 1 tablet by mouth daily.  . Multiple Vitamins-Minerals (CENTRUM PO) Take 1 tablet by mouth daily.   Marland Kitchen omeprazole (PRILOSEC) 20 MG capsule TAKE (1) CAPSULE BY MOUTH TWICE DAILY FOR REFLUX (Patient taking differently: Take 20 mg by mouth daily. )  . [DISCONTINUED] pravastatin (PRAVACHOL) 10 MG tablet Take 1 tablet (10 mg total) by mouth daily.     Allergies:   Patient has no known allergies.   Social History   Tobacco Use  . Smoking status: Never Smoker  . Smokeless tobacco: Never Used  Substance Use Topics  . Alcohol use: Yes    Alcohol/week: 0.0 standard drinks    Comment: rarely  . Drug use: No     Family Hx: The patient's family history includes COPD in her brother; Cancer in her mother; Colon cancer in her father;  Congestive Heart Failure in her brother; Dementia in her father; Heart attack in her daughter and mother; Heart disease in her mother; Stroke in her daughter and mother. There is no history of Breast cancer.  ROS:   Please see the history of present illness.   All other systems reviewed and are negative.   Prior CV studies:   The following studies were reviewed today:  Carotid Doppler (12/24/2018): Right ICA 1-35% stenosis.  Left ICA 60-79% stenosis.  Left ECA greater than 50% stenosis.  Antegrade flow in both vertebral arteries.  Holter monitor (03/25/2018): Predominantly sinus rhythm with rare PACs and occasional PVCs.  Echo (03/06/2017): Normal LV size.  LVEF 60-65% with normal wall motion and diastolic function.  Mild MR.  Normal RV size and function.  Normal PA pressure.  Labs/Other Tests and Data Reviewed:    EKG:  No ECG reviewed.  Recent Labs: 03/19/2018: Magnesium 2.0 05/25/2018: TSH 3.71 10/07/2018: ALT 17 10/28/2018: BUN 11; Creatinine, Ser 0.81; Hemoglobin 12.8;  Platelets 263; Potassium 3.5; Sodium 139   Recent Lipid Panel Lab Results  Component Value Date/Time   CHOL 194 10/07/2018 08:50 AM   TRIG 128.0 10/07/2018 08:50 AM   HDL 49.20 10/07/2018 08:50 AM   CHOLHDL 4 10/07/2018 08:50 AM   LDLCALC 119 (H) 10/07/2018 08:50 AM   LDLDIRECT 151.7 08/16/2013 08:16 AM    Wt Readings from Last 3 Encounters:  01/27/19 143 lb (64.9 kg)  12/24/18 146 lb (66.2 kg)  10/29/18 142 lb (64.4 kg)     Objective:    Vital Signs:  BP 124/63 (BP Location: Left Arm, Patient Position: Sitting, Cuff Size: Normal)   Pulse 69   Ht 5\' 4"  (1.626 m)   Wt 143 lb (64.9 kg)   BMI 24.55 kg/m     ASSESSMENT & PLAN:    Palpitations with PACs and PVCs: Overall, symptoms are a little bit worse than at prior visits.  Some of this may be related to recent emotional stress and recovery from surgery.  There are no worrisome accompanying symptoms.  We have agreed to defer additional work-up at this  time.  Hypertension: Blood pressure under much better control with current antihypertensive regimen.  No medication changes at this time.  Leg swelling: Chronic, always worse on the left.  Given back surgery 3 months ago, DVT is a consideration, though given stability of symptoms and resolution of swelling with leg elevation, I am more suspicious for venous insufficiency.  I advised Kristen Strickland to contact us if she has worsening swelling, pain, or erythema in her legs.  She should also let us know if she develops chest pain or shortness of breath.  Hyperlipidemia: Given at least moderate carotid artery stenosis noted on carotid Dopplers, I have recommended escalation of statin therapy for a target LDL less than 70.  We discussed switching to a high intensity statin versus continuing pravastatin.  Kristen Strickland has chosen the latter; we will increase pravastatin to 20 mg daily and repeat a fasting lipid panel and ALT in 3 months.  Joint pains that the patient describes are most consistent with arthritis than statin induced myalgias.  If her pain worsens with escalation of pravastatin, Kristen Strickland was advised to contact us for further instructions.  Carotid artery stenosis: No symptoms.  Bilateral carotid artery stenoses, left greater than right, noted by duplex earlier this year.  Continue aspirin and escalate statin therapy, as above.  Kristen Strickland should follow-up with vascular surgery as previously arranged.  COVID-19 Education: The signs and symptoms of COVID-19 were discussed with the patient and how to seek care for testing (follow up with PCP or arrange E-visit).  The importance of social distancing was discussed today.  Time:   Today, I have spent 15 minutes with the patient with telehealth technology discussing the above problems.     Medication Adjustments/Labs and Tests Ordered: Current medicines are reviewed at length with the patient today.  Concerns regarding medicines are outlined above.    Tests Ordered: Orders Placed This Encounter  Procedures  . Lipid panel  . ALT   Medication Changes: Meds ordered this encounter  Medications  . pravastatin (PRAVACHOL) 20 MG tablet    Sig: Take 1 tablet (20 mg total) by mouth every evening.    Dispense:  90 tablet    Refill:  3    Disposition:  Follow up in 6 month(s)  Signed, Nelva Bush, MD  01/27/2019 9:10 AM    [facility]

## 2019-01-27 ENCOUNTER — Other Ambulatory Visit: Payer: Self-pay

## 2019-01-27 ENCOUNTER — Telehealth (INDEPENDENT_AMBULATORY_CARE_PROVIDER_SITE_OTHER): Payer: Medicare Other | Admitting: Internal Medicine

## 2019-01-27 ENCOUNTER — Telehealth: Payer: Self-pay | Admitting: *Deleted

## 2019-01-27 ENCOUNTER — Encounter: Payer: Self-pay | Admitting: Internal Medicine

## 2019-01-27 VITALS — BP 124/63 | HR 69 | Ht 64.0 in | Wt 143.0 lb

## 2019-01-27 DIAGNOSIS — I493 Ventricular premature depolarization: Secondary | ICD-10-CM | POA: Diagnosis not present

## 2019-01-27 DIAGNOSIS — M7989 Other specified soft tissue disorders: Secondary | ICD-10-CM | POA: Diagnosis not present

## 2019-01-27 DIAGNOSIS — R002 Palpitations: Secondary | ICD-10-CM | POA: Diagnosis not present

## 2019-01-27 DIAGNOSIS — I6523 Occlusion and stenosis of bilateral carotid arteries: Secondary | ICD-10-CM | POA: Diagnosis not present

## 2019-01-27 DIAGNOSIS — I491 Atrial premature depolarization: Secondary | ICD-10-CM | POA: Diagnosis not present

## 2019-01-27 DIAGNOSIS — I10 Essential (primary) hypertension: Secondary | ICD-10-CM

## 2019-01-27 DIAGNOSIS — E785 Hyperlipidemia, unspecified: Secondary | ICD-10-CM

## 2019-01-27 MED ORDER — PRAVASTATIN SODIUM 20 MG PO TABS: 20.0000 mg | ORAL_TABLET | Freq: Every evening | ORAL | 3 refills | Status: DC

## 2019-01-27 NOTE — Patient Instructions (Signed)
Medication Instructions:  Your physician has recommended you make the following change in your medication:  1- INCREASE Pravastatin to 20 mg by mouth once a day with evening meal.   If you need a refill on your cardiac medications before your next appointment, please call your pharmacy.   Lab work: Your physician recommends that you return for lab work in: 3 months to check cholesterol level. (LIPID, ALT). You will need to be FASTING. Please call our office in July to schedule an appointment to come in for the lab work.    If you have labs (blood work) drawn today and your tests are completely normal, you will receive your results only by: Marland Kitchen MyChart Message (if you have MyChart) OR . A paper copy in the mail If you have any lab test that is abnormal or we need to change your treatment, we will call you to review the results.  Testing/Procedures: none  Follow-Up: At [facility], you and your health needs are our priority.  As part of our continuing mission to provide you with exceptional heart care, we have created designated Provider Care Teams.  These Care Teams include your primary Cardiologist (physician) and Advanced Practice Providers (APPs -  Physician Assistants and Nurse Practitioners) who all work together to provide you with the care you need, when you need it. You will need a follow up appointment in 6 months.  Please call our office 2 months in advance to schedule this appointment.  You may see Nelva Bush, MD OR Christell Faith, PA-C.

## 2019-01-27 NOTE — Telephone Encounter (Signed)
Called patient. She verbalized understanding of everything listed on AVS from today's telephone visit with Dr End. No further questions at this time.

## 2019-02-09 ENCOUNTER — Other Ambulatory Visit: Payer: Self-pay | Admitting: Internal Medicine

## 2019-03-25 ENCOUNTER — Encounter: Payer: Self-pay | Admitting: Internal Medicine

## 2019-03-25 ENCOUNTER — Other Ambulatory Visit: Payer: Self-pay

## 2019-03-25 ENCOUNTER — Ambulatory Visit (INDEPENDENT_AMBULATORY_CARE_PROVIDER_SITE_OTHER): Payer: Medicare Other | Admitting: Internal Medicine

## 2019-03-25 ENCOUNTER — Ambulatory Visit (INDEPENDENT_AMBULATORY_CARE_PROVIDER_SITE_OTHER): Payer: Medicare Other

## 2019-03-25 DIAGNOSIS — M545 Low back pain, unspecified: Secondary | ICD-10-CM

## 2019-03-25 DIAGNOSIS — E785 Hyperlipidemia, unspecified: Secondary | ICD-10-CM

## 2019-03-25 DIAGNOSIS — Z0000 Encounter for general adult medical examination without abnormal findings: Secondary | ICD-10-CM | POA: Diagnosis not present

## 2019-03-25 DIAGNOSIS — F439 Reaction to severe stress, unspecified: Secondary | ICD-10-CM

## 2019-03-25 DIAGNOSIS — I10 Essential (primary) hypertension: Secondary | ICD-10-CM

## 2019-03-25 DIAGNOSIS — I6523 Occlusion and stenosis of bilateral carotid arteries: Secondary | ICD-10-CM | POA: Diagnosis not present

## 2019-03-25 NOTE — Progress Notes (Signed)
Subjective:   Kristen Strickland is a 73 y.o. female who presents for Medicare Annual (Subsequent) preventive examination.  Review of Systems:  No ROS.  Medicare Wellness Virtual Visit.  Visual/audio telehealth visit, UTA vital signs.   See social history for additional risk factors.   Cardiac Risk Factors include: advanced age (>67men, >70 women);hypertension     Objective:     Vitals: There were no vitals taken for this visit.  There is no height or weight on file to calculate BMI.  Advanced Directives 03/25/2019 10/28/2018 03/20/2018 03/19/2017 03/12/2016 04/09/2013 04/08/2013  Does Patient Have a Medical Advance Directive? No No No No No Patient does not have advance directive Patient does not have advance directive  Would patient like information on creating a medical advance directive? No - Patient declined No - Patient declined No - Patient declined No - Patient declined No - patient declined information - -  Pre-existing out of facility DNR order (yellow form or pink MOST form) - - - - - No -    Tobacco Social History   Tobacco Use  Smoking Status Never Smoker  Smokeless Tobacco Never Used     Counseling given: Not Answered   Clinical Intake:  Pre-visit preparation completed: Yes        Diabetes: No  How often do you need to have someone help you when you read instructions, pamphlets, or other written materials from your doctor or pharmacy?: 1 - Never  Interpreter Needed?: No     Past Medical History:  Diagnosis Date  . Arthritis   . Carotid arterial disease ([place])    a. 11/2017 Carotid U/S: RICA 2-20%, LICA 25-42%.   . Chronic fatigue   . Chronic leg pain   . DDD (degenerative disc disease), lumbar   . Dyspnea on exertion    a. 02/2017 Echo: EF 60-65%, no rwma, mild MR. Nl RV size/fxn. Nl PASP; b. 02/2017 MV: small, mild, fixed basal inferolateral defect - ? artifact vs scar. No ischemia. EF >65%.  . Hypercholesterolemia   . Hypertension   . Insomnia   .  Palpitations    a. 02/2017 Holter: Avg HR 66 (51-115), rare PACs, four brief runs of PAT - up to 5 beats, max rate 157. Rare isolated PVCs. No sustained arrhythmias; b. 03/2018 24h Holter: Rare PACs, 3 beat run PAT (122 bpm), PVCs (2% of beats).  . Radiculopathy    Lumbar region  . Sinus bradycardia   . Spondylolisthesis    lumbosacral region   Past Surgical History:  Procedure Laterality Date  . ABDOMINAL EXPOSURE N/A 11/10/2018   Procedure: ABDOMINAL EXPOSURE;  Surgeon: Angelia Mould, MD;  Location: [place];  Service: Vascular;  Laterality: N/A;  . ABDOMINAL HYSTERECTOMY  1975  . ANTERIOR CERVICAL DECOMP/DISCECTOMY FUSION N/A 04/09/2013   Procedure: ANTERIOR CERVICAL DECOMPRESSION/DISCECTOMY FUSION 2 LEVELS;  Surgeon: Floyce Stakes, MD;  Location: [facility];  Service: Neurosurgery;  Laterality: N/A;  Cervical five-six Cervical six-seven  Anterior cervical decompression/diskectomy/fusion  . ANTERIOR LUMBAR FUSION N/A 11/10/2018   Procedure: Anterior Lumbar Interbody Fusion-Lumbar five-Sacral one;  Surgeon: Earnie Larsson, MD;  Location: [place];  Service: Neurosurgery;  Laterality: N/A;  . BACK SURGERY     ruptured disc  . RECONSTRUCTION OF NOSE     Family History  Problem Relation Age of Onset  . Cancer Mother        ovarian/uterine  . Heart disease Mother   . Heart attack Mother   .  Stroke Mother   . Colon cancer Father   . Dementia Father   . COPD Brother   . Congestive Heart Failure Brother   . Stroke Daughter   . Heart attack Daughter   . Suicidality Brother   . Breast cancer Neg Hx    Social History   Socioeconomic History  . Marital status: Married    Spouse name: Not on file  . Number of children: 2  . Years of education: Not on file  . Highest education level: Not on file  Occupational History  . Not on file  Social Needs  . Financial resource strain: Not hard at all  . Food insecurity:    Worry: Never true    Inability: Never true  . Transportation  needs:    Medical: No    Non-medical: No  Tobacco Use  . Smoking status: Never Smoker  . Smokeless tobacco: Never Used  Substance and Sexual Activity  . Alcohol use: Yes    Alcohol/week: 0.0 standard drinks    Comment: rarely  . Drug use: No  . Sexual activity: Yes  Lifestyle  . Physical activity:    Days per week: Not on file    Minutes per session: Not on file  . Stress: Not at all  Relationships  . Social connections:    Talks on phone: Not on file    Gets together: Not on file    Attends religious service: Not on file    Active member of club or organization: Not on file    Attends meetings of clubs or organizations: Not on file    Relationship status: Not on file  Other Topics Concern  . Not on file  Social History Narrative  . Not on file    Outpatient Encounter Medications as of 03/25/2019  Medication Sig  . amLODipine (NORVASC) 5 MG tablet Take 1 tablet (5 mg total) by mouth daily.  Marland Kitchen aspirin EC 81 MG tablet Take 81 mg by mouth daily.  . cyclobenzaprine (FLEXERIL) 10 MG tablet Take 1 tablet (10 mg total) by mouth 3 (three) times daily as needed for muscle spasms.  Marland Kitchen losartan-hydrochlorothiazide (HYZAAR) 100-25 MG tablet TAKE 1 TABLET BY MOUTH ONCE DAILY.  . Melatonin 10 MG TABS Take 1 capsule by mouth daily.  . Multiple Vitamins-Minerals (CENTRUM PO) Take 1 tablet by mouth daily.   Marland Kitchen omeprazole (PRILOSEC) 20 MG capsule TAKE (1) CAPSULE BY MOUTH TWICE DAILY FOR REFLUX  . pravastatin (PRAVACHOL) 20 MG tablet Take 1 tablet (20 mg total) by mouth every evening.   No facility-administered encounter medications on file as of 03/25/2019.     Activities of Daily Living In your present state of health, do you have any difficulty performing the following activities: 03/25/2019 10/28/2018  Hearing? N N  Vision? N N  Difficulty concentrating or making decisions? N N  Walking or climbing stairs? N -  Dressing or bathing? N N  Doing errands, shopping? N -  Preparing Food and  eating ? N -  Using the Toilet? N -  In the past six months, have you accidently leaked urine? Y -  Comment Managed with daily brief/liner.  -  Do you have problems with loss of bowel control? N -  Managing your Medications? N -  Managing your Finances? N -  Housekeeping or managing your Housekeeping? N -  Some recent data might be hidden    Patient Care Team: Einar Pheasant, MD as PCP - General (Internal Medicine)  Nelva Bush, MD as PCP - Cardiology (Cardiology) Earnie Larsson, MD as Consulting Physician (Neurosurgery)    Assessment:   This is a routine wellness examination for Kristen Strickland.  I connected with patient 03/25/19 at  9:00 AM EDT by an audio enabled telemedicine application and verified that I am speaking with the correct person using two identifiers. Patient stated full name and DOB. Patient gave permission to continue with virtual visit. Patient's location was at home and Nurse's location was at [place] office.   Taking melatonin 10mg  at bedtime and is doing ok with it.   Health Screenings  Mammogram - 10/2018 Bone Density - 04/2018 Glaucoma -none Hearing -demonstrates normal hearing during visit. Labs followed by pcp Cholesterol - 01/2017 Dental- UTD Vision- UTD  Social  Alcohol intake - yes      Smoking history- never   Smokers in home? none Illicit drug use? none Exercise - some walking, active around the home Diet - regular Sexually Active -yes BMI- discussed the importance of a healthy diet, water intake and the benefits of aerobic exercise.  Educational material provided.   Safety  Patient feels safe at home- yes Patient does have smoke detectors at home- yes Patient does wear sunscreen or protective clothing when in direct sunlight -yes Patient does wear seat belt when in a moving vehicle -yes  Covid-19 precautions and sickness symptoms discussed.   Activities of Daily Living Patient denies needing assistance with: driving, household chores, feeding  themselves, getting from bed to chair, getting to the toilet, bathing/showering, dressing, managing money, or preparing meals.  No new identified risk were noted.    Depression Screen Patient notes losing her brother to suicide several days ago but she is doing ok. She plans to follow up with her pcp as needed. She is staying busy with family and around the house and declines further intervention at this time.   Medication-taking as directed and without issues.   Fall Screen Patient denies being afraid of falling or falling in the last year.   Memory Screen Patient is alert.  Patient denies difficulty focusing, concentrating or misplacing items. Correctly identified the president of the [place], season and stating the months of the year in reverse.  Patient plans to practice brain engagement exercises for brain stimulation.  Immunizations The following Immunizations were discussed: Influenza, shingles, pneumonia, and tetanus.   Other Providers Patient Care Team: Einar Pheasant, MD as PCP - General (Internal Medicine) End, Harrell Gave, MD as PCP - Cardiology (Cardiology) Earnie Larsson, MD as Consulting Physician (Neurosurgery)  Exercise Activities and Dietary recommendations Current Exercise Habits: Home exercise routine, Type of exercise: walking, Intensity: Mild  Goals      Patient Stated   . Follow up with Primary Care Provider (pt-stated)     As needed       Fall Risk Fall Risk  03/25/2019 03/20/2018 03/19/2017 03/12/2016 02/08/2015  Falls in the past year? 0 Yes No Yes No  Number falls in past yr: - 1 - 1 -  Injury with Fall? - No - No -  Comment - She missed a step off of a ladder but did not get hurt.  - - -  Risk for fall due to : - - - - -  Risk for fall due to: Comment - - - - -  Follow up - - - Education provided;Falls prevention discussed -   Depression Screen PHQ 2/9 Scores 03/25/2019 03/20/2018 03/19/2017 03/12/2016  PHQ - 2 Score 1 0 0 0  PHQ- 9 Score - - 0 -      Cognitive Function MMSE - Mini Mental State Exam 03/20/2018 03/19/2017 03/12/2016  Orientation to time 5 5 5   Orientation to Place 5 5 5   Registration 3 3 3   Attention/ Calculation 4 3 5   Attention/Calculation-comments - difficulty with simple calculation -  Recall 1 1 2   Language- name 2 objects 2 2 2   Language- repeat 1 1 1   Language- follow 3 step command 3 3 3   Language- read & follow direction 1 1 1   Write a sentence 1 1 1   Copy design 1 1 1   Total score 27 26 29      6CIT Screen 03/25/2019  What Year? 0 points  What month? 0 points  What time? 0 points  Count back from 20 0 points  Months in reverse 0 points     There is no immunization history on file for this patient.  Screening Tests Health Maintenance  Topic Date Due  . TETANUS/TDAP  04/24/1966  . COLONOSCOPY  03/04/2024  . DEXA SCAN  Completed  . Hepatitis C Screening  Completed  . INFLUENZA VACCINE  Discontinued  . MAMMOGRAM  Discontinued  . PNA vac Low Risk Adult  Discontinued        Plan:   End of life planning; Advanced aging; Advanced directives discussed.  No HCPOA/Living Will.  Additional information declined at this time.  I have personally reviewed and noted the following in the patient's chart:   . Medical and social history . Use of alcohol, tobacco or illicit drugs  . Current medications and supplements . Functional ability and status . Nutritional status . Physical activity . Advanced directives . List of other physicians . Hospitalizations, surgeries, and ER visits in previous 12 months . Vitals . Screenings to include cognitive, depression, and falls . Referrals and appointments  In addition, I have reviewed and discussed with patient certain preventive protocols, quality metrics, and best practice recommendations. A written personalized care plan for preventive services as well as general preventive health recommendations were provided to patient.     Varney Biles, LPN   02/25/176   Reviewed above information.  Agree with assessment and plan.    Dr Nicki Reaper

## 2019-03-25 NOTE — Progress Notes (Signed)
Virtual Visit via telephone Note  This visit type was conducted due to national recommendations for restrictions regarding the COVID-19 pandemic (e.g. social distancing).  This format is felt to be most appropriate for this patient at this time.  All issues noted in this document were discussed and addressed.  No physical exam was performed (except for noted visual exam findings with Video Visits).   I connected with Kristen Strickland by telephone and verified that I am speaking with the correct person using two identifiers. Location patient: home Location provider: work or home office Persons participating in the telephone visit: patient, provider  I discussed the limitations, risks, security and privacy concerns of performing an evaluation and management service by telephone and the availability of in person appointments. The patient expressed understanding and agreed to proceed.   Reason for visit: scheduled follow up.    HPI: Increased stress recently.  Her brother recently committed suicide.  Discussed wit her today.  Has recently had multiple deaths in her family.  Overall she feels she is handling things relatively well.  No chest pain.  Breathing stable.  Saw cardiology 01/27/19.  Felt stable.  No changes made.  On amlodipine and losartan/hctz.  Blood pressure doing better.  Is s/p back surgery - 11/10/18.  Feels she is doing well from her surgery.  Is having some bladder urgency and frequency.  No dysuria.  No abdominal pain.  Bowels moving.  On pravastatin.  Tolerating.  Seeing [facility]. Had carotid ultrasound 12/24/18 - 1-39% right and 60-79% left.  Recommended f/u in 6 months.     ROS: See pertinent positives and negatives per HPI.  Past Medical History:  Diagnosis Date  . Arthritis   . Carotid arterial disease ([place])    a. 11/2017 Carotid U/S: RICA 2-95%, LICA 28-41%.   . Chronic fatigue   . Chronic leg pain   . DDD (degenerative disc disease), lumbar   . Dyspnea on exertion    a.  02/2017 Echo: EF 60-65%, no rwma, mild MR. Nl RV size/fxn. Nl PASP; b. 02/2017 MV: small, mild, fixed basal inferolateral defect - ? artifact vs scar. No ischemia. EF >65%.  . Hypercholesterolemia   . Hypertension   . Insomnia   . Palpitations    a. 02/2017 Holter: Avg HR 66 (51-115), rare PACs, four brief runs of PAT - up to 5 beats, max rate 157. Rare isolated PVCs. No sustained arrhythmias; b. 03/2018 24h Holter: Rare PACs, 3 beat run PAT (122 bpm), PVCs (2% of beats).  . Radiculopathy    Lumbar region  . Sinus bradycardia   . Spondylolisthesis    lumbosacral region    Past Surgical History:  Procedure Laterality Date  . ABDOMINAL EXPOSURE N/A 11/10/2018   Procedure: ABDOMINAL EXPOSURE;  Surgeon: Angelia Mould, MD;  Location: [facility];  Service: Vascular;  Laterality: N/A;  . ABDOMINAL HYSTERECTOMY  1975  . ANTERIOR CERVICAL DECOMP/DISCECTOMY FUSION N/A 04/09/2013   Procedure: ANTERIOR CERVICAL DECOMPRESSION/DISCECTOMY FUSION 2 LEVELS;  Surgeon: Floyce Stakes, MD;  [facility];  Service: Neurosurgery;  Laterality: N/A;  Cervical five-six Cervical six-seven  Anterior cervical decompression/diskectomy/fusion  . ANTERIOR LUMBAR FUSION N/A 11/10/2018   Procedure: Anterior Lumbar Interbody Fusion-Lumbar five-Sacral one;  Surgeon: Earnie Larsson, MD;  Location: [facility];  Service: Neurosurgery;  Laterality: N/A;  . BACK SURGERY     ruptured disc  . RECONSTRUCTION OF NOSE      Family History  Problem Relation Age of Onset  .  Cancer Mother        ovarian/uterine  . Heart disease Mother   . Heart attack Mother   . Stroke Mother   . Colon cancer Father   . Dementia Father   . COPD Brother   . Congestive Heart Failure Brother   . Stroke Daughter   . Heart attack Daughter   . Suicidality Brother   . Breast cancer Neg Hx     SOCIAL HX: reviewed.    Current Outpatient Medications:  .  amLODipine (NORVASC) 5 MG tablet, Take 1 tablet (5 mg total) by mouth daily., Disp: 90  tablet, Rfl: 3 .  aspirin EC 81 MG tablet, Take 81 mg by mouth daily., Disp: , Rfl:  .  cyclobenzaprine (FLEXERIL) 10 MG tablet, Take 1 tablet (10 mg total) by mouth 3 (three) times daily as needed for muscle spasms., Disp: 30 tablet, Rfl: 0 .  losartan-hydrochlorothiazide (HYZAAR) 100-25 MG tablet, TAKE 1 TABLET BY MOUTH ONCE DAILY., Disp: 90 tablet, Rfl: 0 .  Multiple Vitamins-Minerals (CENTRUM PO), Take 1 tablet by mouth daily. , Disp: , Rfl:  .  omeprazole (PRILOSEC) 20 MG capsule, TAKE (1) CAPSULE BY MOUTH TWICE DAILY FOR REFLUX, Disp: 180 capsule, Rfl: 0 .  pravastatin (PRAVACHOL) 20 MG tablet, Take 1 tablet (20 mg total) by mouth every evening., Disp: 90 tablet, Rfl: 3 .  Melatonin 10 MG TABS, Take 1 capsule by mouth daily., Disp: , Rfl:   EXAM:  VITALS per patient if applicable: 409/81  GENERAL: alert, oriented, appears well and in no acute distress  HEENT: atraumatic, conjunttiva clear, no obvious abnormalities on inspection of external nose and ears  NECK: normal movements of the head and neck  LUNGS: on inspection no signs of respiratory distress, breathing rate appears normal, no obvious gross SOB, gasping or wheezing  CV: no obvious cyanosis  PSYCH/NEURO: pleasant and cooperative, no obvious depression or anxiety, speech and thought processing grossly intact  ASSESSMENT AND PLAN:  Discussed the following assessment and plan:  Midline low back pain without sciatica, unspecified chronicity  Bilateral carotid artery stenosis  Hyperlipidemia LDL goal <70  Essential hypertension  Stress  Back pain Has seen neurosurgery.  S/p surgery 11/10/18.  Has f/u with Dr Trenton Gammon 04/07/19.    Bilateral carotid artery stenosis Followed by [facility].  Carotid ultrasound 1-39% right and 60-79% left.  Last evaluated 12/2018.  Recommended f/u in 6 months.    Hyperlipidemia LDL goal <70 Low cholesterol diet and exercise.  Follow lipid panel and liver function tests.   On pravastatin.     Hypertension Blood pressure appears to be doing well.  Continue current medication regimen.  Follow pressures.  Follow metabolic panel.   Stress Increased stress as outlined.  Discussed with her today.  She does not feel needs anything more at this time.  Has good support.  Follow.      I discussed the assessment and treatment plan with the patient. The patient was provided an opportunity to ask questions and all were answered. The patient agreed with the plan and demonstrated an understanding of the instructions.   The patient was advised to call back or seek an in-person evaluation if the symptoms worsen or if the condition fails to improve as anticipated.  I provided 22 minutes of non-face-to-face time during this encounter.   Einar Pheasant, MD

## 2019-03-25 NOTE — Patient Instructions (Addendum)
  Kristen Strickland , Thank you for taking time to come for your Medicare Wellness Visit. I appreciate your ongoing commitment to your health goals. Please review the following plan we discussed and let me know if I can assist you in the future.   These are the goals we discussed: Goals      Patient Stated   . Follow up with Primary Care Provider (pt-stated)     As needed       This is a list of the screening recommended for you and due dates:  Health Maintenance  Topic Date Due  . Tetanus Vaccine  04/24/1966  . Colon Cancer Screening  03/04/2024  . DEXA scan (bone density measurement)  Completed  .  Hepatitis C: One time screening is recommended by Center for Disease Control  (CDC) for  adults born from [birth year] through 1964.   Completed  . Flu Shot  Discontinued  . Mammogram  Discontinued  . Pneumonia vaccines  Discontinued

## 2019-03-28 ENCOUNTER — Encounter: Payer: Self-pay | Admitting: Internal Medicine

## 2019-03-28 NOTE — Assessment & Plan Note (Signed)
Has seen neurosurgery.  S/p surgery 11/10/18.  Has f/u with Dr Trenton Gammon 04/07/19.

## 2019-03-28 NOTE — Assessment & Plan Note (Signed)
Increased stress as outlined.  Discussed with her today.  She does not feel needs anything more at this time.  Has good support.  Follow.

## 2019-03-28 NOTE — Assessment & Plan Note (Signed)
Followed by [facility].  Carotid ultrasound 1-39% right and 60-79% left.  Last evaluated 12/2018.  Recommended f/u in 6 months.

## 2019-03-28 NOTE — Assessment & Plan Note (Signed)
Blood pressure appears to be doing well.  Continue current medication regimen.  Follow pressures.  Follow metabolic panel.

## 2019-03-28 NOTE — Assessment & Plan Note (Signed)
Low cholesterol diet and exercise.  Follow lipid panel and liver function tests.  On pravastatin.   

## 2019-04-14 DIAGNOSIS — M4317 Spondylolisthesis, lumbosacral region: Secondary | ICD-10-CM | POA: Diagnosis not present

## 2019-04-30 ENCOUNTER — Other Ambulatory Visit: Payer: Self-pay | Admitting: Internal Medicine

## 2019-05-27 ENCOUNTER — Other Ambulatory Visit: Payer: Self-pay

## 2019-05-28 ENCOUNTER — Ambulatory Visit
Admission: RE | Admit: 2019-05-28 | Discharge: 2019-05-28 | Disposition: A | Discharge status: Home / Self Care | Payer: Medicare Other | Source: Ambulatory Visit | Attending: Internal Medicine | Admitting: Internal Medicine

## 2019-05-28 ENCOUNTER — Ambulatory Visit (INDEPENDENT_AMBULATORY_CARE_PROVIDER_SITE_OTHER): Payer: Medicare Other

## 2019-05-28 ENCOUNTER — Ambulatory Visit (INDEPENDENT_AMBULATORY_CARE_PROVIDER_SITE_OTHER): Payer: Medicare Other | Admitting: Internal Medicine

## 2019-05-28 VITALS — BP 128/62 | HR 68 | Temp 98.2°F | Resp 16 | Wt 148.0 lb

## 2019-05-28 DIAGNOSIS — F439 Reaction to severe stress, unspecified: Secondary | ICD-10-CM | POA: Diagnosis not present

## 2019-05-28 DIAGNOSIS — I10 Essential (primary) hypertension: Secondary | ICD-10-CM

## 2019-05-28 DIAGNOSIS — E785 Hyperlipidemia, unspecified: Secondary | ICD-10-CM | POA: Diagnosis not present

## 2019-05-28 DIAGNOSIS — R739 Hyperglycemia, unspecified: Secondary | ICD-10-CM | POA: Diagnosis not present

## 2019-05-28 DIAGNOSIS — I6523 Occlusion and stenosis of bilateral carotid arteries: Secondary | ICD-10-CM

## 2019-05-28 DIAGNOSIS — M545 Low back pain, unspecified: Secondary | ICD-10-CM

## 2019-05-28 DIAGNOSIS — M7989 Other specified soft tissue disorders: Secondary | ICD-10-CM | POA: Insufficient documentation

## 2019-05-28 DIAGNOSIS — M79672 Pain in left foot: Secondary | ICD-10-CM

## 2019-05-28 DIAGNOSIS — R600 Localized edema: Secondary | ICD-10-CM | POA: Diagnosis not present

## 2019-05-28 NOTE — Progress Notes (Signed)
Patient ID: Kristen Strickland, female   DOB: 05/29/1946, 73 y.o.   MRN: [id number]   Subjective:    Patient ID: Kristen Strickland, female    DOB: 11/12/1945, 73 y.o.   MRN: [id number]  HPI  Patient here for a scheduled follow up.  Increased stress. Trying to cope with her brother's suicide.  Stays active.  No chest pain.  No sob. No acid reflux.  No abdominal pian.  Bowels moving.  Left leg is swollen.  Foot pain and lower leg swelling.  No increased erythema or warmth.  No known injury.  Overall she feels she is handling stress.  Does not feel she needs anything more at this time.  Discussed immunizations.  She declines.     Past Medical History:  Diagnosis Date  . Arthritis   . Carotid arterial disease ([place])    a. 11/2017 Carotid U/S: RICA 4-09%, LICA 73-53%.   . Chronic fatigue   . Chronic leg pain   . DDD (degenerative disc disease), lumbar   . Dyspnea on exertion    a. 02/2017 Echo: EF 60-65%, no rwma, mild MR. Nl RV size/fxn. Nl PASP; b. 02/2017 MV: small, mild, fixed basal inferolateral defect - ? artifact vs scar. No ischemia. EF >65%.  . Hypercholesterolemia   . Hypertension   . Insomnia   . Palpitations    a. 02/2017 Holter: Avg HR 66 (51-115), rare PACs, four brief runs of PAT - up to 5 beats, max rate 157. Rare isolated PVCs. No sustained arrhythmias; b. 03/2018 24h Holter: Rare PACs, 3 beat run PAT (122 bpm), PVCs (2% of beats).  . Radiculopathy    Lumbar region  . Sinus bradycardia   . Spondylolisthesis    lumbosacral region   Past Surgical History:  Procedure Laterality Date  . ABDOMINAL EXPOSURE N/A 11/10/2018   Procedure: ABDOMINAL EXPOSURE;  Surgeon: Angelia Mould, MD;  Location: [facility];  Service: Vascular;  Laterality: N/A;  . ABDOMINAL HYSTERECTOMY  1975  . ANTERIOR CERVICAL DECOMP/DISCECTOMY FUSION N/A 04/09/2013   Procedure: ANTERIOR CERVICAL DECOMPRESSION/DISCECTOMY FUSION 2 LEVELS;  Surgeon: Floyce Stakes, MD;  [facility];  Service: Neurosurgery;   Laterality: N/A;  Cervical five-six Cervical six-seven  Anterior cervical decompression/diskectomy/fusion  . ANTERIOR LUMBAR FUSION N/A 11/10/2018   Procedure: Anterior Lumbar Interbody Fusion-Lumbar five-Sacral one;  Surgeon: Earnie Larsson, MD;  Location: [facility];  Service: Neurosurgery;  Laterality: N/A;  . BACK SURGERY     ruptured disc  . RECONSTRUCTION OF NOSE     Family History  Problem Relation Age of Onset  . Cancer Mother        ovarian/uterine  . Heart disease Mother   . Heart attack Mother   . Stroke Mother   . Colon cancer Father   . Dementia Father   . COPD Brother   . Congestive Heart Failure Brother   . Stroke Daughter   . Heart attack Daughter   . Suicidality Brother   . Breast cancer Neg Hx    Social History   Socioeconomic History  . Marital status: Married    Spouse name: Not on file  . Number of children: 2  . Years of education: Not on file  . Highest education level: Not on file  Occupational History  . Not on file  Social Needs  . Financial resource strain: Not hard at all  . Food insecurity    Worry: Never true    Inability: Never true  .  Transportation needs    Medical: No    Non-medical: No  Tobacco Use  . Smoking status: Never Smoker  . Smokeless tobacco: Never Used  Substance and Sexual Activity  . Alcohol use: Yes    Alcohol/week: 0.0 standard drinks    Comment: rarely  . Drug use: No  . Sexual activity: Yes  Lifestyle  . Physical activity    Days per week: Not on file    Minutes per session: Not on file  . Stress: Not at all  Relationships  . Social Herbalist on phone: Not on file    Gets together: Not on file    Attends religious service: Not on file    Active member of club or organization: Not on file    Attends meetings of clubs or organizations: Not on file    Relationship status: Not on file  Other Topics Concern  . Not on file  Social History Narrative  . Not on file    Outpatient Encounter Medications as  of 05/28/2019  Medication Sig  . amLODipine (NORVASC) 5 MG tablet Take 1 tablet (5 mg total) by mouth daily.  Marland Kitchen aspirin EC 81 MG tablet Take 81 mg by mouth daily.  . cyclobenzaprine (FLEXERIL) 10 MG tablet Take 1 tablet (10 mg total) by mouth 3 (three) times daily as needed for muscle spasms.  Marland Kitchen losartan-hydrochlorothiazide (HYZAAR) 100-25 MG tablet TAKE 1 TABLET BY MOUTH ONCE DAILY.  . Melatonin 10 MG TABS Take 1 capsule by mouth daily.  . Multiple Vitamins-Minerals (CENTRUM PO) Take 1 tablet by mouth daily.   Marland Kitchen omeprazole (PRILOSEC) 20 MG capsule TAKE (1) CAPSULE BY MOUTH TWICE DAILY FOR REFLUX  . pravastatin (PRAVACHOL) 20 MG tablet Take 1 tablet (20 mg total) by mouth every evening.   No facility-administered encounter medications on file as of 05/28/2019.     Review of Systems  Constitutional: Negative for appetite change and unexpected weight change.  HENT: Negative for congestion and sinus pressure.   Respiratory: Negative for cough, chest tightness and shortness of breath.   Cardiovascular: Negative for chest pain, palpitations and leg swelling.  Gastrointestinal: Negative for abdominal pain, diarrhea, nausea and vomiting.  Genitourinary: Negative for difficulty urinating and dysuria.  Musculoskeletal: Negative for joint swelling and myalgias.       Foot pain as outlined.  Right lower extremity swelling with associated foot swelling.    Skin: Negative for color change and rash.  Neurological: Negative for dizziness, light-headedness and headaches.  Psychiatric/Behavioral: Negative for agitation and dysphoric mood.       Objective:    Physical Exam Constitutional:      General: She is not in acute distress.    Appearance: Normal appearance.  HENT:     Right Ear: External ear normal.     Left Ear: External ear normal.  Eyes:     General: No scleral icterus.       Right eye: No discharge.        Left eye: No discharge.     Conjunctiva/sclera: Conjunctivae normal.  Neck:      Musculoskeletal: Neck supple. No muscular tenderness.     Thyroid: No thyromegaly.  Cardiovascular:     Rate and Rhythm: Normal rate and regular rhythm.  Pulmonary:     Effort: No respiratory distress.     Breath sounds: Normal breath sounds. No wheezing.  Abdominal:     General: Bowel sounds are normal.     Palpations: Abdomen  is soft.     Tenderness: There is no abdominal tenderness.  Musculoskeletal:        General: No tenderness.     Comments: Increased soft tissue swelling left foot.  Some increased swelling left lower extremity.  No increased erythema.    Lymphadenopathy:     Cervical: No cervical adenopathy.  Skin:    Findings: No erythema or rash.  Neurological:     Mental Status: She is alert.  Psychiatric:        Mood and Affect: Mood normal.        Behavior: Behavior normal.     BP 128/62   Pulse 68   Temp 98.2 F (36.8 C) (Oral)   Resp 16   Wt 148 lb (67.1 kg)   SpO2 98%   BMI 25.40 kg/m  Wt Readings from Last 3 Encounters:  05/28/19 148 lb (67.1 kg)  01/27/19 143 lb (64.9 kg)  12/24/18 146 lb (66.2 kg)     Lab Results  Component Value Date   WBC 5.7 10/28/2018   HGB 12.8 10/28/2018   HCT 39.6 10/28/2018   PLT 263 10/28/2018   GLUCOSE 155 (H) 10/28/2018   CHOL 194 10/07/2018   TRIG 128.0 10/07/2018   HDL 49.20 10/07/2018   LDLDIRECT 151.7 08/16/2013   LDLCALC 119 (H) 10/07/2018   ALT 17 10/07/2018   AST 16 10/07/2018   NA 139 10/28/2018   K 3.5 10/28/2018   CL 105 10/28/2018   CREATININE 0.81 10/28/2018   BUN 11 10/28/2018   CO2 27 10/28/2018   TSH 3.71 05/25/2018    Mm 3d Screen Breast Bilateral  Result Date: 11/04/2018 CLINICAL DATA:  Screening. EXAM: DIGITAL SCREENING BILATERAL MAMMOGRAM WITH TOMO AND CAD COMPARISON:  Previous exam(s). ACR Breast Density Category c: The breast tissue is heterogeneously dense, which may obscure small masses. FINDINGS: There are no findings suspicious for malignancy. Images were processed with CAD.  IMPRESSION: No mammographic evidence of malignancy. A result letter of this screening mammogram will be mailed directly to the patient. RECOMMENDATION: Screening mammogram in one year. (Code:SM-B-01Y) BI-RADS CATEGORY  1: Negative. Electronically Signed   By: Kristopher Oppenheim M.D.   On: 11/04/2018 10:43       Assessment & Plan:   Problem List Items Addressed This Visit    Back pain    S/p surgery.  Followed by neurosurgery.  Sees Dr Trenton Gammon.        Bilateral carotid artery stenosis    Followed by [facility].  Carotid ultrasound 1-39% right and 60-79% left.  Last evaluated 12/2018.  Recommended f/u in 6 months.        Hyperlipidemia LDL goal <70    Low cholesterol diet and exercise.  On pravastatin.  Follow lipid panel and liver function tests.        Hypertension    Blood pressure doing well.  Continue current medication regimen.  Follow pressures.  Follow metabolic panel.        Left foot pain    Persistent pain with soft tissue swelling.  Check xray.  May need podiatry referral.        Relevant Orders   DG Foot 2 Views Left (Completed)   Leg swelling    Left lower extremity swelling.  Check lower extremity ultrasound to confirm no DVT.  If negative, leg elevation/compression hose.        Stress    Increased stress as outlined.  Discussed with her today.  She does not feel  needs anything more at this time.  Follow.         Other Visit Diagnoses    Left leg swelling    -  Primary   Relevant Orders   US Venous Img Lower Unilateral Left (Completed)       Einar Pheasant, MD

## 2019-05-30 ENCOUNTER — Encounter: Payer: Self-pay | Admitting: Internal Medicine

## 2019-05-30 DIAGNOSIS — M79672 Pain in left foot: Secondary | ICD-10-CM | POA: Insufficient documentation

## 2019-05-30 DIAGNOSIS — R739 Hyperglycemia, unspecified: Secondary | ICD-10-CM | POA: Insufficient documentation

## 2019-05-30 NOTE — Assessment & Plan Note (Signed)
S/p surgery.  Followed by neurosurgery.  Sees Dr Trenton Gammon.

## 2019-05-30 NOTE — Assessment & Plan Note (Signed)
Blood pressure doing well.  Continue current medication regimen.  Follow pressures.  Follow metabolic panel.  

## 2019-05-30 NOTE — Assessment & Plan Note (Signed)
Persistent pain with soft tissue swelling.  Check xray.  May need podiatry referral.

## 2019-05-30 NOTE — Assessment & Plan Note (Addendum)
Left lower extremity swelling.  Check lower extremity ultrasound to confirm no DVT.  If negative, leg elevation/compression hose.

## 2019-05-30 NOTE — Assessment & Plan Note (Signed)
Low cholesterol diet and exercise.  On pravastatin.  Follow lipid panel and liver function tests.   

## 2019-05-30 NOTE — Assessment & Plan Note (Signed)
Increased stress as outlined.  Discussed with her today.  She does not feel needs anything more at this time.  Follow.   

## 2019-05-30 NOTE — Assessment & Plan Note (Signed)
Low carb diet and exercise.  Follow met b and a1c.  

## 2019-05-30 NOTE — Assessment & Plan Note (Signed)
Followed by [facility].  Carotid ultrasound 1-39% right and 60-79% left.  Last evaluated 12/2018.  Recommended f/u in 6 months.

## 2019-06-02 ENCOUNTER — Other Ambulatory Visit: Payer: Self-pay | Admitting: Internal Medicine

## 2019-06-02 DIAGNOSIS — M79672 Pain in left foot: Secondary | ICD-10-CM

## 2019-06-02 NOTE — Progress Notes (Signed)
Order placed for podiatry referral.   

## 2019-06-18 ENCOUNTER — Ambulatory Visit (INDEPENDENT_AMBULATORY_CARE_PROVIDER_SITE_OTHER): Payer: Medicare Other

## 2019-06-18 ENCOUNTER — Other Ambulatory Visit: Payer: Self-pay

## 2019-06-18 ENCOUNTER — Telehealth: Payer: Self-pay | Admitting: *Deleted

## 2019-06-18 ENCOUNTER — Encounter: Payer: Self-pay | Admitting: Podiatry

## 2019-06-18 ENCOUNTER — Ambulatory Visit (INDEPENDENT_AMBULATORY_CARE_PROVIDER_SITE_OTHER): Payer: Medicare Other | Admitting: Podiatry

## 2019-06-18 ENCOUNTER — Other Ambulatory Visit: Payer: Self-pay | Admitting: Podiatry

## 2019-06-18 VITALS — BP 160/78 | HR 65

## 2019-06-18 DIAGNOSIS — M659 Synovitis and tenosynovitis, unspecified: Secondary | ICD-10-CM

## 2019-06-18 DIAGNOSIS — R600 Localized edema: Secondary | ICD-10-CM

## 2019-06-18 DIAGNOSIS — M65972 Unspecified synovitis and tenosynovitis, left ankle and foot: Secondary | ICD-10-CM

## 2019-06-18 NOTE — Telephone Encounter (Signed)
Copied from [place] [id number]. Topic: General - Other >> Jun 18, 2019  4:00 PM Wynetta Emery, [place] C wrote: Reason for CRM: pt would like to know if it would be okay for her to take IBprofen PM 200 MG?

## 2019-06-21 NOTE — Telephone Encounter (Signed)
Pt called wanting to know if she could take ibuprofen or tylenol PM to help her sleep. She is having trouble staying asleep once she goes to sleep at night. She has tried melatonin. Did not help. Confirmed no other acute issues.

## 2019-06-21 NOTE — Progress Notes (Signed)
   Subjective:  73 y.o. female presenting today as a new patient with a chief complaint of left dorsolateral dull aching foot and ankle pain that began 2-3 months ago. She reports associated swelling and intermittent numbness/tingling. She states she was seen by Dr. Nicki Reaper about two weeks ago and was told she may have arthritis. There are no modifying factors noted. Patient is here for further evaluation and treatment.   Past Medical History:  Diagnosis Date  . Arthritis   . Carotid arterial disease ([place])    a. 11/2017 Carotid U/S: RICA 123456, LICA 123456.   . Chronic fatigue   . Chronic leg pain   . DDD (degenerative disc disease), lumbar   . Dyspnea on exertion    a. 02/2017 Echo: EF 60-65%, no rwma, mild MR. Nl RV size/fxn. Nl PASP; b. 02/2017 MV: small, mild, fixed basal inferolateral defect - ? artifact vs scar. No ischemia. EF >65%.  . Hypercholesterolemia   . Hypertension   . Insomnia   . Palpitations    a. 02/2017 Holter: Avg HR 66 (51-115), rare PACs, four brief runs of PAT - up to 5 beats, max rate 157. Rare isolated PVCs. No sustained arrhythmias; b. 03/2018 24h Holter: Rare PACs, 3 beat run PAT (122 bpm), PVCs (2% of beats).  . Radiculopathy    Lumbar region  . Sinus bradycardia   . Spondylolisthesis    lumbosacral region     Objective / Physical Exam:  General:  The patient is alert and oriented x3 in no acute distress. Dermatology:  Skin is warm, dry and supple bilateral lower extremities. Negative for open lesions or macerations. Vascular:  Edema noted to the left lower extremity. Palpable pedal pulses bilaterally. No erythema noted. Capillary refill within normal limits. Neurological:  Epicritic and protective threshold grossly intact bilaterally.  Musculoskeletal Exam:  Pain on palpation to the anterior lateral medial aspects of the patient's left ankle. Mild edema noted. Range of motion within normal limits to all pedal and ankle joints bilateral. Muscle strength 5/5  in all groups bilateral.   Assessment: 1. Edema LLE secondary to lumbar surgery  2. Ankle synovitis left   Plan of Care:  1. Patient was evaluated. X-Rays in Epic reviewed.  2. Injection of 0.5 mL Celestone Soluspan injected in the patient's left ankle. 3. Compression anklet dispensed.  4. Recommended follow up evaluation with vascular and neuro spine surgeon.  5. Return to clinic as needed    Edrick Kins, DPM [facility]  Dr. Edrick Kins, [address]                                        [place], [place] [address]                Office [phone]  Fax [phone]

## 2019-06-21 NOTE — Telephone Encounter (Signed)
Can schedule an appt to discuss treatment options.

## 2019-06-22 NOTE — Telephone Encounter (Signed)
Called patient. Line was busy

## 2019-06-23 NOTE — Telephone Encounter (Signed)
Patient has been scheduled for Friday at 12 to discuss

## 2019-06-25 ENCOUNTER — Other Ambulatory Visit: Payer: Self-pay

## 2019-06-25 ENCOUNTER — Ambulatory Visit (INDEPENDENT_AMBULATORY_CARE_PROVIDER_SITE_OTHER): Payer: Medicare Other | Admitting: Internal Medicine

## 2019-06-25 DIAGNOSIS — I10 Essential (primary) hypertension: Secondary | ICD-10-CM | POA: Diagnosis not present

## 2019-06-25 DIAGNOSIS — G479 Sleep disorder, unspecified: Secondary | ICD-10-CM

## 2019-06-25 DIAGNOSIS — R42 Dizziness and giddiness: Secondary | ICD-10-CM

## 2019-06-25 DIAGNOSIS — F439 Reaction to severe stress, unspecified: Secondary | ICD-10-CM | POA: Diagnosis not present

## 2019-06-25 DIAGNOSIS — M7989 Other specified soft tissue disorders: Secondary | ICD-10-CM

## 2019-06-25 NOTE — Progress Notes (Signed)
Patient ID: DACY FIGURES, female   DOB: 27-Jul-1946, 73 y.o.   MRN: [id number]   Virtual Visit via telephone Note  This visit type was conducted due to national recommendations for restrictions regarding the COVID-19 pandemic (e.g. social distancing).  This format is felt to be most appropriate for this patient at this time.  All issues noted in this document were discussed and addressed.  No physical exam was performed (except for noted visual exam findings with Video Visits).   I connected with Herschell Dimes by a video enabled telemedicine application and verified that I am speaking with the correct person using two identifiers. Location patient: home Location provider: work  Persons participating in the telephone visit: patient, provider  I discussed the limitations, risks, security and privacy concerns of performing an evaluation and management service by telephone and the availability of in person appointments. The patient expressed understanding and agreed to proceed.   Reason for visit: work in appt  HPI: Reports she has been having problems sleeping.  Increased stress recently.  Discussed with her today.  Overall she feels she is handling things relatively well.  States goes to bed around 9:00.  Falls asleep easily.  Wakes up around 12:00-1:30.  Will fall back asleep, but does wake up intermittently.  Gets up around 5-6:00.  Is tired through the day.  Easy to fall asleep.  Works hard through the day.  Is trying to not allow herself to take naps.  Discussed possible sleep apnea.  She does snore.  She declines further w/up.  Leg swelling is better. Seeing podiatry for her foot.  No chest pain.  No sob.  No acid reflux.  No abdominal pain.  Eating.  Had vertigo yesterday.  Has had some issues with vertigo.  Took meclizine.  Is better today.  Discussed further w/up.  She declines.  States blood pressure has been doing well.  Is 128/90 today.  Pulse 72.     ROS: See pertinent positives and  negatives per HPI.  Past Medical History:  Diagnosis Date  . Arthritis   . Carotid arterial disease ([place])    a. 11/2017 Carotid U/S: RICA 123456, LICA 123456.   . Chronic fatigue   . Chronic leg pain   . DDD (degenerative disc disease), lumbar   . Dyspnea on exertion    a. 02/2017 Echo: EF 60-65%, no rwma, mild MR. Nl RV size/fxn. Nl PASP; b. 02/2017 MV: small, mild, fixed basal inferolateral defect - ? artifact vs scar. No ischemia. EF >65%.  . Hypercholesterolemia   . Hypertension   . Insomnia   . Palpitations    a. 02/2017 Holter: Avg HR 66 (51-115), rare PACs, four brief runs of PAT - up to 5 beats, max rate 157. Rare isolated PVCs. No sustained arrhythmias; b. 03/2018 24h Holter: Rare PACs, 3 beat run PAT (122 bpm), PVCs (2% of beats).  . Radiculopathy    Lumbar region  . Sinus bradycardia   . Spondylolisthesis    lumbosacral region    Past Surgical History:  Procedure Laterality Date  . ABDOMINAL EXPOSURE N/A 11/10/2018   Procedure: ABDOMINAL EXPOSURE;  Surgeon: Angelia Mould, MD;  Location: [place];  Service: Vascular;  Laterality: N/A;  . ABDOMINAL HYSTERECTOMY  1975  . ANTERIOR CERVICAL DECOMP/DISCECTOMY FUSION N/A 04/09/2013   Procedure: ANTERIOR CERVICAL DECOMPRESSION/DISCECTOMY FUSION 2 LEVELS;  Surgeon: Floyce Stakes, MD;  Location: [facility];  Service: Neurosurgery;  Laterality: N/A;  Cervical five-six Cervical six-seven  Anterior cervical decompression/diskectomy/fusion  . ANTERIOR LUMBAR FUSION N/A 11/10/2018   Procedure: Anterior Lumbar Interbody Fusion-Lumbar five-Sacral one;  Surgeon: Earnie Larsson, MD;  Location: [place];  Service: Neurosurgery;  Laterality: N/A;  . BACK SURGERY     ruptured disc  . RECONSTRUCTION OF NOSE      Family History  Problem Relation Age of Onset  . Cancer Mother        ovarian/uterine  . Heart disease Mother   . Heart attack Mother   . Stroke Mother   . Colon cancer Father   . Dementia Father   . COPD Brother   .  Congestive Heart Failure Brother   . Stroke Daughter   . Heart attack Daughter   . Suicidality Brother   . Breast cancer Neg Hx     SOCIAL HX: reviewed.    Current Outpatient Medications:  .  amLODipine (NORVASC) 5 MG tablet, Take 1 tablet (5 mg total) by mouth daily., Disp: 90 tablet, Rfl: 3 .  aspirin EC 81 MG tablet, Take 81 mg by mouth daily., Disp: , Rfl:  .  cyclobenzaprine (FLEXERIL) 10 MG tablet, Take 1 tablet (10 mg total) by mouth 3 (three) times daily as needed for muscle spasms., Disp: 30 tablet, Rfl: 0 .  losartan-hydrochlorothiazide (HYZAAR) 100-25 MG tablet, TAKE 1 TABLET BY MOUTH ONCE DAILY., Disp: 90 tablet, Rfl: 0 .  Multiple Vitamins-Minerals (CENTRUM PO), Take 1 tablet by mouth daily. , Disp: , Rfl:  .  omeprazole (PRILOSEC) 20 MG capsule, TAKE (1) CAPSULE BY MOUTH TWICE DAILY FOR REFLUX, Disp: 180 capsule, Rfl: 0 .  pravastatin (PRAVACHOL) 20 MG tablet, Take 1 tablet (20 mg total) by mouth every evening., Disp: 90 tablet, Rfl: 3  EXAM:  VITALS per patient if applicable:  [id number], 73  GENERAL: alert.  Sounds to be in no acute distress.  Answering questions appropriately.    PSYCH/NEURO: pleasant and cooperative, no obvious depression or anxiety, speech and thought processing grossly intact  ASSESSMENT AND PLAN:  Discussed the following assessment and plan:  Dizziness Previous dizziness.  Previous w/up included:  holter with SVT/ventricular ectopy.  Was started on carvedilol.  Some recent dizziness over the last two days.  Took meclizine.  Better today.  Discussed further w/up and evaluation.  She declines.  Follow.   Hypertension States blood pressure has been doing well.  Follow. Continue same medications.    Leg swelling Improved.    Stress Increased stress.  Discussed with her today.  She feels she is handling things relatively well.  Does not feel needs any further intervention.    Sleeping difficulties Discussed her sleeping and waking up throughout  the night.  Discussed feeling tired throughout the day.  May be related to the increased stress.  Discussed possible sleep apnea.  She declines further evaluation.  Has tried melatonin and wants to continue melatonin.  Hold on further medication.  Will notify me if she changes her mind regarding further w/up.      I discussed the assessment and treatment plan with the patient. The patient was provided an opportunity to ask questions and all were answered. The patient agreed with the plan and demonstrated an understanding of the instructions.   The patient was advised to call back or seek an in-person evaluation if the symptoms worsen or if the condition fails to improve as anticipated.  I provided 21 minutes of non-face-to-face time during this encounter.   Einar Pheasant, MD

## 2019-06-29 ENCOUNTER — Encounter: Payer: Self-pay | Admitting: Internal Medicine

## 2019-06-29 DIAGNOSIS — G479 Sleep disorder, unspecified: Secondary | ICD-10-CM | POA: Insufficient documentation

## 2019-06-29 NOTE — Assessment & Plan Note (Signed)
Discussed her sleeping and waking up throughout the night.  Discussed feeling tired throughout the day.  May be related to the increased stress.  Discussed possible sleep apnea.  She declines further evaluation.  Has tried melatonin and wants to continue melatonin.  Hold on further medication.  Will notify me if she changes her mind regarding further w/up.

## 2019-06-29 NOTE — Assessment & Plan Note (Signed)
Previous dizziness.  Previous w/up included:  holter with SVT/ventricular ectopy.  Was started on carvedilol.  Some recent dizziness over the last two days.  Took meclizine.  Better today.  Discussed further w/up and evaluation.  She declines.  Follow.

## 2019-06-29 NOTE — Assessment & Plan Note (Signed)
Increased stress.  Discussed with her today.  She feels she is handling things relatively well.  Does not feel needs any further intervention.

## 2019-06-29 NOTE — Assessment & Plan Note (Signed)
Improved

## 2019-06-29 NOTE — Assessment & Plan Note (Signed)
States blood pressure has been doing well.  Follow. Continue same medications.

## 2019-06-30 ENCOUNTER — Encounter (INDEPENDENT_AMBULATORY_CARE_PROVIDER_SITE_OTHER): Payer: Medicare Other

## 2019-06-30 ENCOUNTER — Ambulatory Visit (INDEPENDENT_AMBULATORY_CARE_PROVIDER_SITE_OTHER): Payer: Medicare Other | Admitting: Nurse Practitioner

## 2019-07-02 ENCOUNTER — Ambulatory Visit (INDEPENDENT_AMBULATORY_CARE_PROVIDER_SITE_OTHER): Payer: Medicare Other

## 2019-07-02 ENCOUNTER — Encounter (INDEPENDENT_AMBULATORY_CARE_PROVIDER_SITE_OTHER): Payer: Self-pay | Admitting: Nurse Practitioner

## 2019-07-02 ENCOUNTER — Other Ambulatory Visit: Payer: Self-pay

## 2019-07-02 ENCOUNTER — Ambulatory Visit (INDEPENDENT_AMBULATORY_CARE_PROVIDER_SITE_OTHER): Payer: Medicare Other | Admitting: Nurse Practitioner

## 2019-07-02 ENCOUNTER — Encounter (INDEPENDENT_AMBULATORY_CARE_PROVIDER_SITE_OTHER): Payer: Self-pay

## 2019-07-02 VITALS — BP 161/66 | HR 61 | Resp 15 | Wt 147.0 lb

## 2019-07-02 DIAGNOSIS — I6523 Occlusion and stenosis of bilateral carotid arteries: Secondary | ICD-10-CM | POA: Diagnosis not present

## 2019-07-02 DIAGNOSIS — I10 Essential (primary) hypertension: Secondary | ICD-10-CM

## 2019-07-02 DIAGNOSIS — E785 Hyperlipidemia, unspecified: Secondary | ICD-10-CM

## 2019-07-02 NOTE — Progress Notes (Signed)
SUBJECTIVE:  Patient ID: Kristen Strickland, female    DOB: July 14, 1946, 73 y.o.   MRN: [id number] Chief Complaint  Patient presents with  . Follow-up    ultrasound follow up    HPI  Kristen Strickland is a 73 y.o. female The patient is seen for follow up evaluation of carotid stenosis. The carotid stenosis followed by ultrasound.   The patient denies amaurosis fugax. There is no recent history of TIA symptoms or focal motor deficits. There is no prior documented CVA.  The patient is taking enteric-coated aspirin 81 mg daily.  There is no history of migraine headaches. There is no history of seizures.  The patient has a history of coronary artery disease, no recent episodes of angina or shortness of breath. The patient denies PAD or claudication symptoms. There is a history of hyperlipidemia which is being treated with a statin.    Carotid Duplex done today shows velocities in the right internal carotid artery consistent with a 1 to 39% stenosis.  The left internal carotid artery velocities are consistent with a 60 to 79% stenosis. This is consistent with 12/24/2018 study.    Past Medical History:  Diagnosis Date  . Arthritis   . Carotid arterial disease ([place])    a. 11/2017 Carotid U/S: RICA 123456, LICA 123456.   . Chronic fatigue   . Chronic leg pain   . DDD (degenerative disc disease), lumbar   . Dyspnea on exertion    a. 02/2017 Echo: EF 60-65%, no rwma, mild MR. Nl RV size/fxn. Nl PASP; b. 02/2017 MV: small, mild, fixed basal inferolateral defect - ? artifact vs scar. No ischemia. EF >65%.  . Hypercholesterolemia   . Hypertension   . Insomnia   . Palpitations    a. 02/2017 Holter: Avg HR 66 (51-115), rare PACs, four brief runs of PAT - up to 5 beats, max rate 157. Rare isolated PVCs. No sustained arrhythmias; b. 03/2018 24h Holter: Rare PACs, 3 beat run PAT (122 bpm), PVCs (2% of beats).  . Radiculopathy    Lumbar region  . Sinus bradycardia   . Spondylolisthesis    lumbosacral  region    Past Surgical History:  Procedure Laterality Date  . ABDOMINAL EXPOSURE N/A 11/10/2018   Procedure: ABDOMINAL EXPOSURE;  Surgeon: Angelia Mould, MD;  Location: [place];  Service: Vascular;  Laterality: N/A;  . ABDOMINAL HYSTERECTOMY  1975  . ANTERIOR CERVICAL DECOMP/DISCECTOMY FUSION N/A 04/09/2013   Procedure: ANTERIOR CERVICAL DECOMPRESSION/DISCECTOMY FUSION 2 LEVELS;  Surgeon: Floyce Stakes, MD;  Location: [facility];  Service: Neurosurgery;  Laterality: N/A;  Cervical five-six Cervical six-seven  Anterior cervical decompression/diskectomy/fusion  . ANTERIOR LUMBAR FUSION N/A 11/10/2018   Procedure: Anterior Lumbar Interbody Fusion-Lumbar five-Sacral one;  Surgeon: Earnie Larsson, MD;  Location: [place];  Service: Neurosurgery;  Laterality: N/A;  . BACK SURGERY     ruptured disc  . RECONSTRUCTION OF NOSE      Social History   Socioeconomic History  . Marital status: Married    Spouse name: Not on file  . Number of children: 2  . Years of education: Not on file  . Highest education level: Not on file  Occupational History  . Not on file  Social Needs  . Financial resource strain: Not hard at all  . Food insecurity    Worry: Never true    Inability: Never true  . Transportation needs    Medical: No    Non-medical: No  Tobacco  Use  . Smoking status: Never Smoker  . Smokeless tobacco: Never Used  Substance and Sexual Activity  . Alcohol use: Yes    Alcohol/week: 0.0 standard drinks    Comment: rarely  . Drug use: No  . Sexual activity: Yes  Lifestyle  . Physical activity    Days per week: Not on file    Minutes per session: Not on file  . Stress: Not at all  Relationships  . Social Herbalist on phone: Not on file    Gets together: Not on file    Attends religious service: Not on file    Active member of club or organization: Not on file    Attends meetings of clubs or organizations: Not on file    Relationship status: Not on file  .  Intimate partner violence    Fear of current or ex partner: No    Emotionally abused: No    Physically abused: No    Forced sexual activity: No  Other Topics Concern  . Not on file  Social History Narrative  . Not on file    Family History  Problem Relation Age of Onset  . Cancer Mother        ovarian/uterine  . Heart disease Mother   . Heart attack Mother   . Stroke Mother   . Colon cancer Father   . Dementia Father   . COPD Brother   . Congestive Heart Failure Brother   . Stroke Daughter   . Heart attack Daughter   . Suicidality Brother   . Breast cancer Neg Hx     No Known Allergies   Review of Systems   Review of Systems: Negative Unless Checked Constitutional: [] Weight loss  [] Fever  [] Chills Cardiac: [] Chest pain   []  Atrial Fibrillation  [] Palpitations   [] Shortness of breath when laying flat   [] Shortness of breath with exertion. [] Shortness of breath at rest Vascular:  [] Pain in legs with walking   [] Pain in legs with standing [] Pain in legs when laying flat   [] Claudication    [] Pain in feet when laying flat    [] History of DVT   [] Phlebitis   [] Swelling in legs   [] Varicose veins   [] Non-healing ulcers Pulmonary:   [] Uses home oxygen   [] Productive cough   [] Hemoptysis   [] Wheeze  [] COPD   [] Asthma Neurologic:  [] Dizziness   [] Seizures  [] Blackouts [] History of stroke   [] History of TIA  [] Aphasia   [] Temporary Blindness   [] Weakness or numbness in arm   [] Weakness or numbness in leg Musculoskeletal:   [] Joint swelling   [] Joint pain   [] Low back pain  []  History of Knee Replacement [x] Arthritis [x] back Surgeries  []  Spinal Stenosis    Hematologic:  [] Easy bruising  [] Easy bleeding   [] Hypercoagulable state   [] Anemic Gastrointestinal:  [] Diarrhea   [] Vomiting  [] Gastroesophageal reflux/heartburn   [] Difficulty swallowing. [] Abdominal pain Genitourinary:  [] Chronic kidney disease   [] Difficult urination  [] Anuric   [] Blood in urine [] Frequent urination  [] Burning  with urination   [] Hematuria Skin:  [] Rashes   [] Ulcers [] Wounds Psychological:  [] History of anxiety   []  History of major depression  []  Memory Difficulties      OBJECTIVE:   Physical Exam  BP (!) 161/66 (BP Location: Right Arm)   Pulse 61   Resp 15   Wt 147 lb (66.7 kg)   BMI 25.23 kg/m   Gen: WD/WN, NAD Head: [place]/AT, No temporalis  wasting.  Ear/Nose/Throat: Hearing grossly intact, nares w/o erythema or drainage Eyes: PER, EOMI, sclera nonicteric.  Neck: Supple, no masses.  No JVD.  Pulmonary:  Good air movement, no use of accessory muscles.  Cardiac: RRR Vascular:  Vessel Right Left  Radial Palpable Palpable                       Gastrointestinal: soft, non-distended. No guarding/no peritoneal signs.  Musculoskeletal: M/S 5/5 throughout.  No deformity or atrophy.  Neurologic: Pain and light touch intact in extremities.  Symmetrical.  Speech is fluent. Motor exam as listed above. Psychiatric: Judgment intact, Mood & affect appropriate for pt's clinical situation. Dermatologic: No Venous rashes. No Ulcers Noted.  No changes consistent with cellulitis. Lymph : No Cervical lymphadenopathy, no lichenification or skin changes of chronic lymphedema.       ASSESSMENT AND PLAN:  1. Bilateral carotid artery stenosis Recommend:  Given the patient's asymptomatic subcritical stenosis no further invasive testing or surgery at this time.  Carotid Duplex done today shows velocities in the right internal carotid artery consistent with a 1 to 39% stenosis.  The left internal carotid artery velocities are consistent with a 60 to 79% stenosis. This is consistent with 12/24/2018 study.   Continue antiplatelet therapy as prescribed Continue management of CAD, HTN and Hyperlipidemia Healthy heart diet,  encouraged exercise at least 4 times per week Follow up in 6 months with duplex ultrasound and physical exam   2. Hyperlipidemia LDL goal <70 Continue statin as ordered and reviewed,  no changes at this time   3. Essential hypertension Continue antihypertensive medications as already ordered, these medications have been reviewed and there are no changes at this time.    Current Outpatient Medications on File Prior to Visit  Medication Sig Dispense Refill  . amLODipine (NORVASC) 5 MG tablet Take 1 tablet (5 mg total) by mouth daily. 90 tablet 3  . aspirin EC 81 MG tablet Take 81 mg by mouth daily.    . cyclobenzaprine (FLEXERIL) 10 MG tablet Take 1 tablet (10 mg total) by mouth 3 (three) times daily as needed for muscle spasms. 30 tablet 0  . losartan-hydrochlorothiazide (HYZAAR) 100-25 MG tablet TAKE 1 TABLET BY MOUTH ONCE DAILY. 90 tablet 0  . Multiple Vitamins-Minerals (CENTRUM PO) Take 1 tablet by mouth daily.     Marland Kitchen omeprazole (PRILOSEC) 20 MG capsule TAKE (1) CAPSULE BY MOUTH TWICE DAILY FOR REFLUX 180 capsule 0  . pravastatin (PRAVACHOL) 20 MG tablet Take 1 tablet (20 mg total) by mouth every evening. 90 tablet 3   No current facility-administered medications on file prior to visit.     There are no Patient Instructions on file for this visit. No follow-ups on file.   Kris Hartmann, NP  This note was completed with Sales executive.  Any errors are purely unintentional.

## 2019-08-05 ENCOUNTER — Other Ambulatory Visit: Payer: Self-pay | Admitting: Internal Medicine

## 2019-08-05 MED ORDER — PRAVASTATIN SODIUM 20 MG PO TABS: 20.0000 mg | ORAL_TABLET | Freq: Every evening | ORAL | 3 refills | Status: DC

## 2019-08-05 MED ORDER — OMEPRAZOLE 20 MG PO CPDR: DELAYED_RELEASE_CAPSULE | ORAL | 0 refills | Status: DC

## 2019-08-05 MED ORDER — LOSARTAN POTASSIUM-HCTZ 100-25 MG PO TABS: 1.0000 | ORAL_TABLET | Freq: Every day | ORAL | 0 refills | Status: DC

## 2019-08-05 NOTE — Telephone Encounter (Signed)
Medication Refill - Medication: pravastatin (PRAVACHOL) 20 MG tablet(Expired), losartan-hydrochlorothiazide (HYZAAR) 100-25 MG tablet, omeprazole (PRILOSEC) 20 MG capsule   Preferred Pharmacy:  [facility], [place] [phone] (Phone) [phone] (Fax)     Pt was advised that RX refills may take up to 3 business days. We ask that you follow-up with your pharmacy.

## 2019-08-11 NOTE — Progress Notes (Signed)
Follow-up Outpatient Visit Date: 08/13/2019  Primary Care Provider: Einar Pheasant, [place] [id number] [place] [id number]  Chief Complaint: Dyspnea on exertion  HPI:  Ms. Kristen Strickland is a 73 y.o. year-old female with history of hypertension, hyperlipidemia, carotid artery stenosis, and degenerative disc disease, who presents for follow-up of palpitations and leg swelling.  We last spoke in early April via virtual visit.  She noted increased leg swelling after back surgery earlier in the year.  Palpitations seem to be worse than at prior visits, which Ms. Kristen Strickland attributed to emotional stress and recovery from her back surgery medication changes or further testing were pursued.  Today, Ms. Kristen Strickland reports that she feels relatively well.  Her exertional dyspnea, most pronounced when walking up an incline, has been stable.  She continues to have some fatigue.  She notes mild lightheadedness with walking up an incline as well.  She has not had any palpitations nor chest pain.  She denies lower extremity edema.  She continues to have pain in both legs, particularly when lying in bed at night.  She attributes some of this to her back surgery.  Pain predates addition of pravastatin.  She does not feel significantly different since this was escalated at our last encounter in April.  --------------------------------------------------------------------------------------------------  Past Medical History:  Diagnosis Date  . Arthritis   . Carotid arterial disease ([place])    a. 11/2017 Carotid U/S: RICA 123456, LICA 123456.   . Chronic fatigue   . Chronic leg pain   . DDD (degenerative disc disease), lumbar   . Dyspnea on exertion    a. 02/2017 Echo: EF 60-65%, no rwma, mild MR. Nl RV size/fxn. Nl PASP; b. 02/2017 MV: small, mild, fixed basal inferolateral defect - ? artifact vs scar. No ischemia. EF >65%.  . Hypercholesterolemia   . Hypertension   . Insomnia   . Palpitations    a.  02/2017 Holter: Avg HR 66 (51-115), rare PACs, four brief runs of PAT - up to 5 beats, max rate 157. Rare isolated PVCs. No sustained arrhythmias; b. 03/2018 24h Holter: Rare PACs, 3 beat run PAT (122 bpm), PVCs (2% of beats).  . Radiculopathy    Lumbar region  . Sinus bradycardia   . Spondylolisthesis    lumbosacral region   Past Surgical History:  Procedure Laterality Date  . ABDOMINAL EXPOSURE N/A 11/10/2018   Procedure: ABDOMINAL EXPOSURE;  Surgeon: Angelia Mould, MD;  Location: [place];  Service: Vascular;  Laterality: N/A;  . ABDOMINAL HYSTERECTOMY  1975  . ANTERIOR CERVICAL DECOMP/DISCECTOMY FUSION N/A 04/09/2013   Procedure: ANTERIOR CERVICAL DECOMPRESSION/DISCECTOMY FUSION 2 LEVELS;  Surgeon: Floyce Stakes, MD;  Location: [facility];  Service: Neurosurgery;  Laterality: N/A;  Cervical five-six Cervical six-seven  Anterior cervical decompression/diskectomy/fusion  . ANTERIOR LUMBAR FUSION N/A 11/10/2018   Procedure: Anterior Lumbar Interbody Fusion-Lumbar five-Sacral one;  Surgeon: Earnie Larsson, MD;  Location: [place];  Service: Neurosurgery;  Laterality: N/A;  . BACK SURGERY     ruptured disc  . RECONSTRUCTION OF NOSE      Current Meds  Medication Sig  . amLODipine (NORVASC) 5 MG tablet Take 1 tablet (5 mg total) by mouth daily.  Marland Kitchen aspirin EC 81 MG tablet Take 81 mg by mouth daily.  Marland Kitchen losartan-hydrochlorothiazide (HYZAAR) 100-25 MG tablet Take 1 tablet by mouth daily.  . Multiple Vitamins-Minerals (CENTRUM PO) Take 1 tablet by mouth daily.   Marland Kitchen omeprazole (PRILOSEC) 20 MG capsule TAKE (1) CAPSULE  BY MOUTH TWICE DAILY FOR REFLUX  . pravastatin (PRAVACHOL) 20 MG tablet Take 1 tablet (20 mg total) by mouth every evening.    Allergies: Patient has no known allergies.  Social History   Tobacco Use  . Smoking status: Never Smoker  . Smokeless tobacco: Never Used  Substance Use Topics  . Alcohol use: Yes    Alcohol/week: 0.0 standard drinks    Comment: rarely  . Drug  use: No    Family History  Problem Relation Age of Onset  . Cancer Mother        ovarian/uterine  . Heart disease Mother   . Heart attack Mother   . Stroke Mother   . Colon cancer Father   . Dementia Father   . COPD Brother   . Congestive Heart Failure Brother   . Stroke Daughter   . Heart attack Daughter   . Suicidality Brother   . Breast cancer Neg Hx     Review of Systems: A 12-system review of systems was performed and was negative except as noted in the HPI.  --------------------------------------------------------------------------------------------------  Physical Exam: BP 138/70 (BP Location: Left Arm, Patient Position: Sitting, Cuff Size: Normal)   Pulse (!) 56   Ht 5\' 4"  (1.626 m)   Wt 144 lb 8 oz (65.5 kg)   SpO2 98%   BMI 24.80 kg/m   General: NAD. HEENT: No conjunctival pallor or scleral icterus.  Facemask in place. Neck: Supple without lymphadenopathy, thyromegaly, JVD, or HJR. Lungs: Normal work of breathing. Clear to auscultation bilaterally without wheezes or crackles. Heart: Bradycardic but regular with 2/6 systolic murmur.  No rubs or gallops. Abd: Bowel sounds present. Soft, NT/ND without hepatosplenomegaly Ext: No lower extremity edema. Radial, PT, and DP pulses are 2+ bilaterally. Skin: Warm and dry without rash.  EKG: Sinus bradycardia (heart rate 58 bpm).  Otherwise, no abnormality.  Lab Results  Component Value Date   WBC 5.7 10/28/2018   HGB 12.8 10/28/2018   HCT 39.6 10/28/2018   MCV 90.8 10/28/2018   PLT 263 10/28/2018    Lab Results  Component Value Date   NA 139 10/28/2018   K 3.5 10/28/2018   CL 105 10/28/2018   CO2 27 10/28/2018   BUN 11 10/28/2018   CREATININE 0.81 10/28/2018   GLUCOSE 155 (H) 10/28/2018   ALT 17 10/07/2018    Lab Results  Component Value Date   CHOL 194 10/07/2018   HDL 49.20 10/07/2018   LDLCALC 119 (H) 10/07/2018   LDLDIRECT 151.7 08/16/2013   TRIG 128.0 10/07/2018   CHOLHDL 4 10/07/2018     --------------------------------------------------------------------------------------------------  ASSESSMENT AND PLAN: Dyspnea on exertion and fatigue: Longstanding and stable.  Prior work-up by Dr. Farrel Conners in 2018, including echocardiogram and myocardial perfusion stress test preserved LVEF.  We will check a CBC and TSH today.  Otherwise, no further work-up is recommended at this time.  Palpitations: No symptoms reported since we last spoke in April.  No further work-up or intervention at this time.  Hyperlipidemia: Ms. Kristen Strickland seems to be tolerating pravastatin well.  We will obtain a lipid panel and CMP today to assess response.  Target LDL is less than 70, given history of at least moderate carotid artery stenosis.  Leg pain: More pronounced following back surgery earlier this year.  Pedal pulses are normal and pain is predominantly present with rest, arguing against claudication.  Defer work-up at this time.  Carotid artery stenosis: Mild to moderate bilateral carotid disease noted, left greater  than right.  Continue aspirin and statin therapy as well as follow-up with vascular surgery.  Follow-up: Require turn to clinic in 1 year.  Nelva Bush, MD 08/13/2019 9:17 AM

## 2019-08-12 ENCOUNTER — Telehealth: Payer: Self-pay | Admitting: Internal Medicine

## 2019-08-12 NOTE — Telephone Encounter (Signed)
Will route update to Dr. Saunders Revel

## 2019-08-12 NOTE — Telephone Encounter (Signed)
Attempted to contact the patient with Dr. Darnelle Bos response. Unable to lmom patient's line rang busy.

## 2019-08-12 NOTE — Telephone Encounter (Signed)
That is fine.  We will check labs tomorrow.  Nelva Bush, MD [facility] Pager: [phone]

## 2019-08-12 NOTE — Telephone Encounter (Signed)
Patient calling  Patient was unable to get lab work prior to appt with Dr End Would like to know if labs can be drawn tomorrow morning - patient will be fasting Please call to discuss

## 2019-08-13 ENCOUNTER — Ambulatory Visit (INDEPENDENT_AMBULATORY_CARE_PROVIDER_SITE_OTHER): Payer: Medicare Other | Admitting: Internal Medicine

## 2019-08-13 ENCOUNTER — Other Ambulatory Visit: Payer: Self-pay

## 2019-08-13 ENCOUNTER — Encounter: Payer: Self-pay | Admitting: Internal Medicine

## 2019-08-13 ENCOUNTER — Other Ambulatory Visit: Payer: Medicare Other

## 2019-08-13 VITALS — BP 138/70 | HR 56 | Ht 64.0 in | Wt 144.5 lb

## 2019-08-13 DIAGNOSIS — M79604 Pain in right leg: Secondary | ICD-10-CM

## 2019-08-13 DIAGNOSIS — M79605 Pain in left leg: Secondary | ICD-10-CM | POA: Diagnosis not present

## 2019-08-13 DIAGNOSIS — E785 Hyperlipidemia, unspecified: Secondary | ICD-10-CM

## 2019-08-13 DIAGNOSIS — R0609 Other forms of dyspnea: Secondary | ICD-10-CM

## 2019-08-13 DIAGNOSIS — I6523 Occlusion and stenosis of bilateral carotid arteries: Secondary | ICD-10-CM | POA: Diagnosis not present

## 2019-08-13 DIAGNOSIS — R5383 Other fatigue: Secondary | ICD-10-CM

## 2019-08-13 DIAGNOSIS — I493 Ventricular premature depolarization: Secondary | ICD-10-CM | POA: Diagnosis not present

## 2019-08-13 DIAGNOSIS — R002 Palpitations: Secondary | ICD-10-CM

## 2019-08-13 DIAGNOSIS — R0600 Dyspnea, unspecified: Secondary | ICD-10-CM | POA: Diagnosis not present

## 2019-08-13 NOTE — Patient Instructions (Signed)
Medication Instructions:  - Your physician recommends that you continue on your current medications as directed. Please refer to the Current Medication list given to you today.  *If you need a refill on your cardiac medications before your next appointment, please call your pharmacy*  Lab Work: - Your physician recommends that you have lab work today: CBC/ TSH/ CMET/ Lipid  If you have labs (blood work) drawn today and your tests are completely normal, you will receive your results only by: Marland Kitchen MyChart Message (if you have MyChart) OR . A paper copy in the mail If you have any lab test that is abnormal or we need to change your treatment, we will call you to review the results.  Testing/Procedures: - none ordered  Follow-Up: At [facility], you and your health needs are our priority.  As part of our continuing mission to provide you with exceptional heart care, we have created designated Provider Care Teams.  These Care Teams include your primary Cardiologist (physician) and Advanced Practice Providers (APPs -  Physician Assistants and Nurse Practitioners) who all work together to provide you with the care you need, when you need it.  Your next appointment:   12 months  The format for your next appointment:   In Person  Provider:    You may see Nelva Bush, MD or one of the following Advanced Practice Providers on your designated Care Team:    Murray Hodgkins, NP  Christell Faith, PA-C  Marrianne Mood, PA-C   Other Instructions - n/a

## 2019-08-13 NOTE — Telephone Encounter (Signed)
The patient is due to come in for an appt this morning at 9:00 am with Dr. Saunders Revel. We can address her labs at that time.

## 2019-08-14 LAB — COMPREHENSIVE METABOLIC PANEL
ALT: 23 IU/L (ref 0–32)
AST: 22 IU/L (ref 0–40)
Albumin/Globulin Ratio: 1.8 (ref 1.2–2.2)
Albumin: 4.6 g/dL (ref 3.7–4.7)
Alkaline Phosphatase: 93 IU/L (ref 39–117)
BUN/Creatinine Ratio: 23 (ref 12–28)
BUN: 14 mg/dL (ref 8–27)
Bilirubin Total: 0.8 mg/dL (ref 0.0–1.2)
CO2: 24 mmol/L (ref 20–29)
Calcium: 9.9 mg/dL (ref 8.7–10.3)
Chloride: 100 mmol/L (ref 96–106)
Creatinine, Ser: 0.6 mg/dL (ref 0.57–1.00)
GFR calc Af Amer: 105 mL/min/{1.73_m2} (ref >59)
GFR calc non Af Amer: 91 mL/min/{1.73_m2} (ref >59)
Globulin, Total: 2.5 g/dL (ref 1.5–4.5)
Glucose: 97 mg/dL (ref 65–99)
Potassium: 4.1 mmol/L (ref 3.5–5.2)
Sodium: 139 mmol/L (ref 134–144)
Total Protein: 7.1 g/dL (ref 6.0–8.5)

## 2019-08-14 LAB — CBC
Hematocrit: 41.9 % (ref 34.0–46.6)
Hemoglobin: 13.6 g/dL (ref 11.1–15.9)
MCH: 29 pg (ref 26.6–33.0)
MCHC: 32.5 g/dL (ref 31.5–35.7)
MCV: 89 fL (ref 79–97)
Platelets: 282 10*3/uL (ref 150–450)
RBC: 4.69 x10E6/uL (ref 3.77–5.28)
RDW: 12.8 % (ref 11.7–15.4)
WBC: 5.3 10*3/uL (ref 3.4–10.8)

## 2019-08-14 LAB — TSH: TSH: 4.22 u[IU]/mL (ref 0.450–4.500)

## 2019-08-14 LAB — LIPID PANEL
Chol/HDL Ratio: 3.8 ratio (ref 0.0–4.4)
Cholesterol, Total: 218 mg/dL — ABNORMAL HIGH (ref 100–199)
HDL: 58 mg/dL (ref >39)
LDL Chol Calc (NIH): 140 mg/dL — ABNORMAL HIGH (ref 0–99)
Triglycerides: 114 mg/dL (ref 0–149)
VLDL Cholesterol Cal: 20 mg/dL (ref 5–40)

## 2019-08-16 ENCOUNTER — Telehealth: Payer: Self-pay

## 2019-08-16 DIAGNOSIS — E785 Hyperlipidemia, unspecified: Secondary | ICD-10-CM

## 2019-08-16 MED ORDER — ROSUVASTATIN CALCIUM 20 MG PO TABS: 20.0000 mg | ORAL_TABLET | Freq: Every day | ORAL | 3 refills | Status: DC

## 2019-08-16 NOTE — Telephone Encounter (Signed)
Call to patient to make her aware of lab results and POC from Dr. Saunders Revel.   Pt verbalized understanding and in agreement with POC.   Will [facility] in [place] for fasting labs.   Med change sent to pt preferred pharmacy on file.   Advised pt to call for any further questions or concerns.

## 2019-08-16 NOTE — Telephone Encounter (Signed)
-----   Message from Nelva Bush, MD sent at 08/15/2019  9:20 PM EDT ----- Please let Ms. Wehner know that her blood counts, kidney function, liver function, and thyroid function are normal.  LDL is not well controlled at 140.  Given history of carotid artery disease, I recommend discontinuation of pravastatin and initiation of rosuvastatin 20 mg daily.  Ms. Pankiewicz should have a repeat lipid panel and LDL in about 3 months.

## 2019-08-31 IMAGING — MG DIGITAL SCREENING BILATERAL MAMMOGRAM WITH TOMO AND CAD
8 series · 8 of 24 positions shown · non-contrast
Comparison: Previous exam(s).

CLINICAL DATA: Screening.

EXAM:
DIGITAL SCREENING BILATERAL MAMMOGRAM WITH TOMO AND CAD

[L MLO synth-2D]
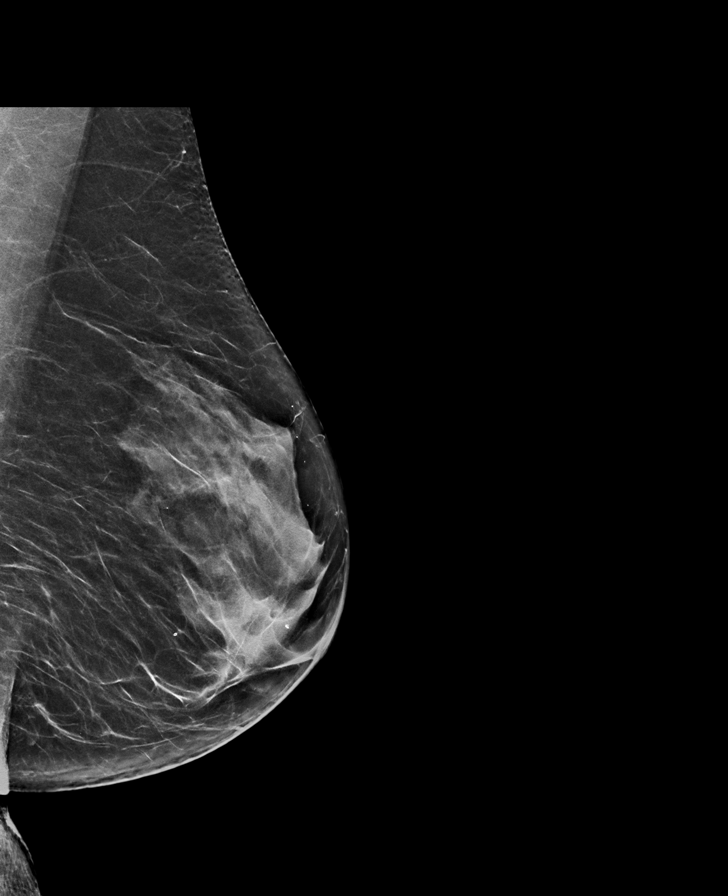

[L CC synth-2D]
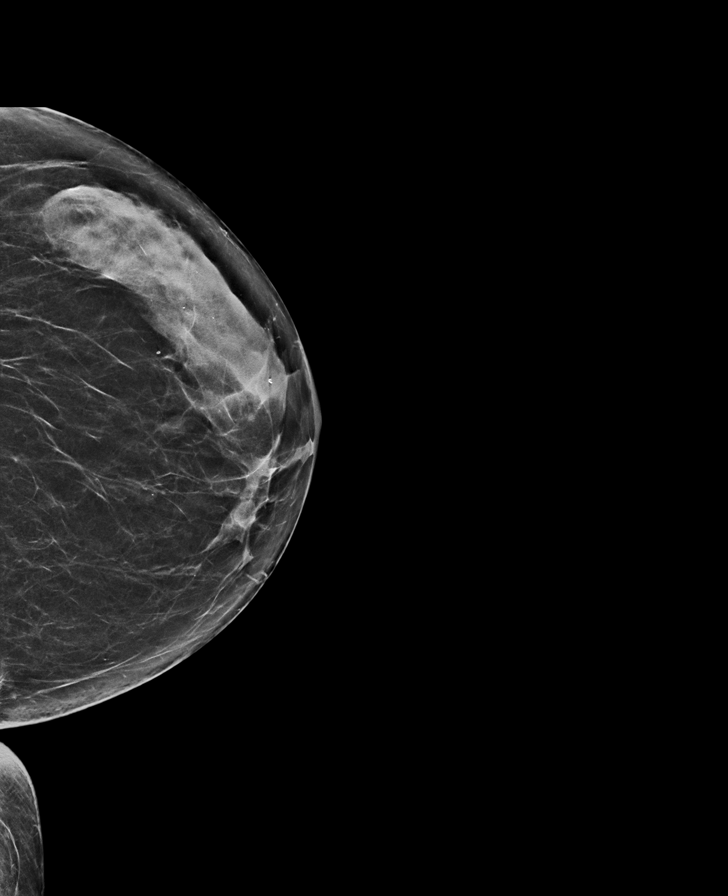

[R CC synth-2D]
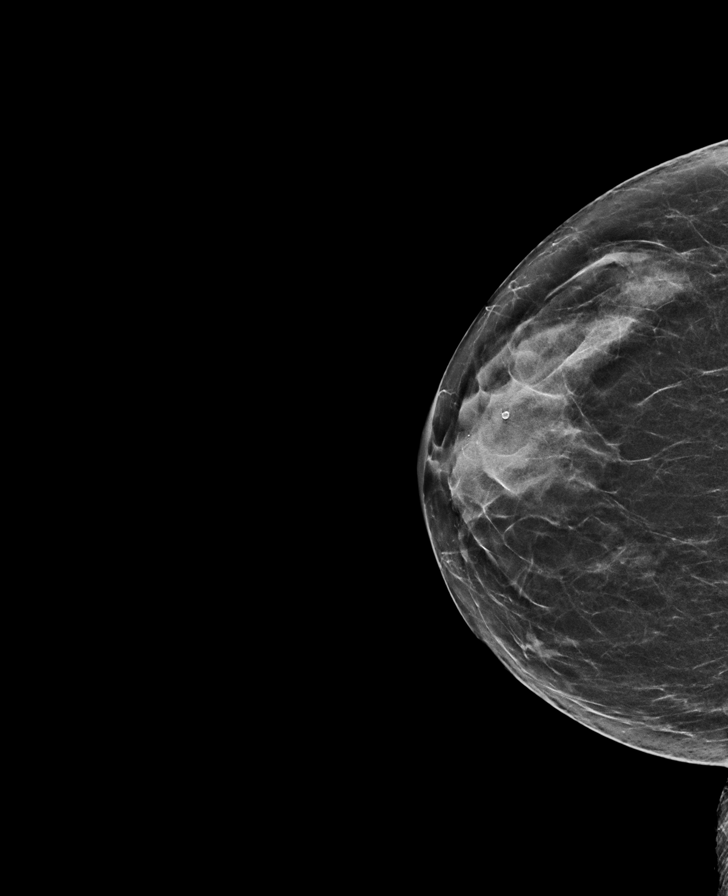

[R MLO synth-2D]
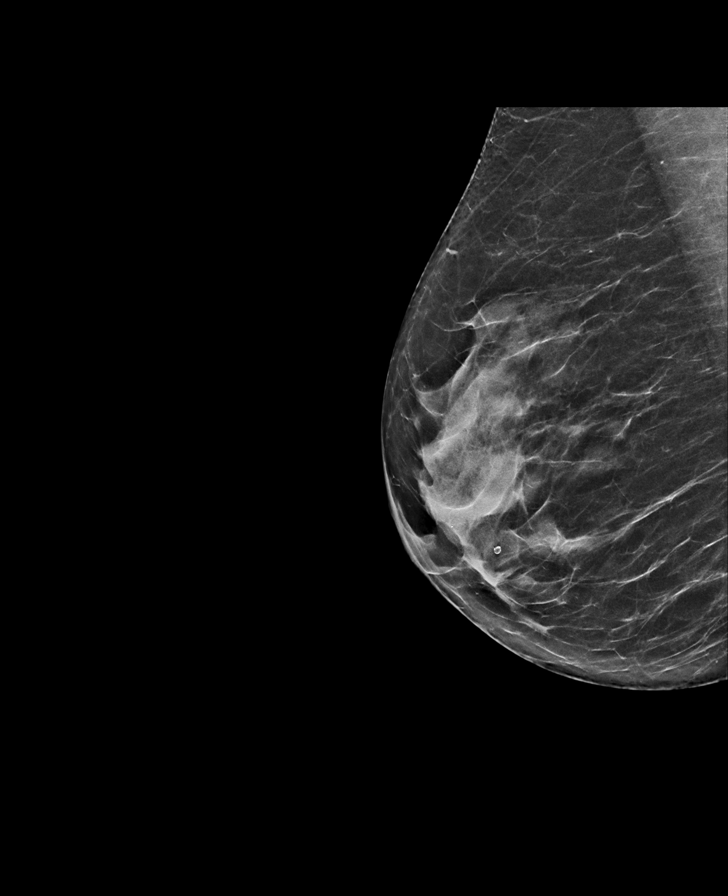

[L MLO tomo · tomo slice 33/66.0]
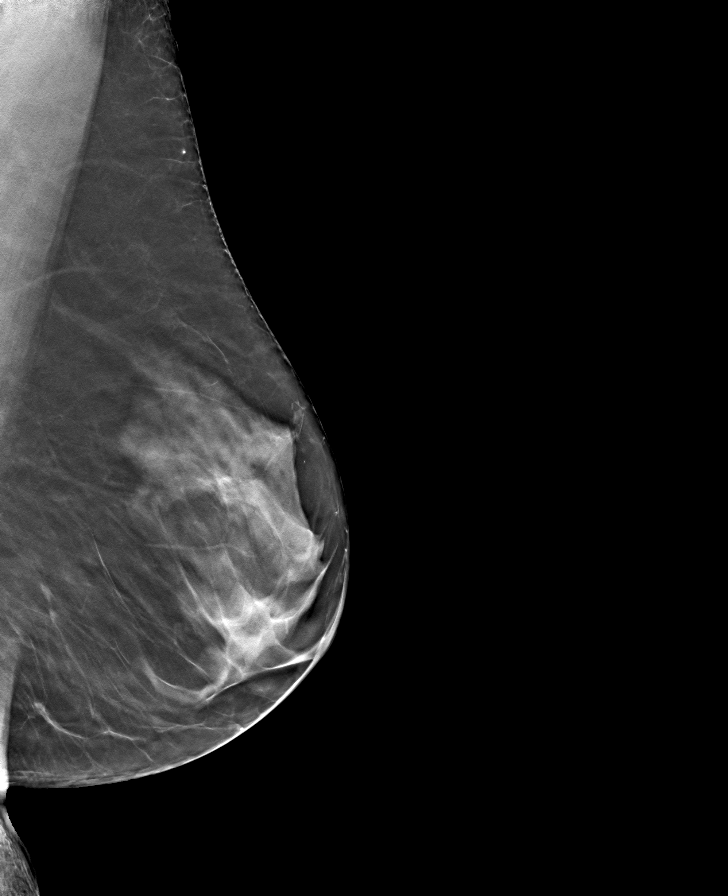

[L CC tomo · tomo slice 31/60.0]
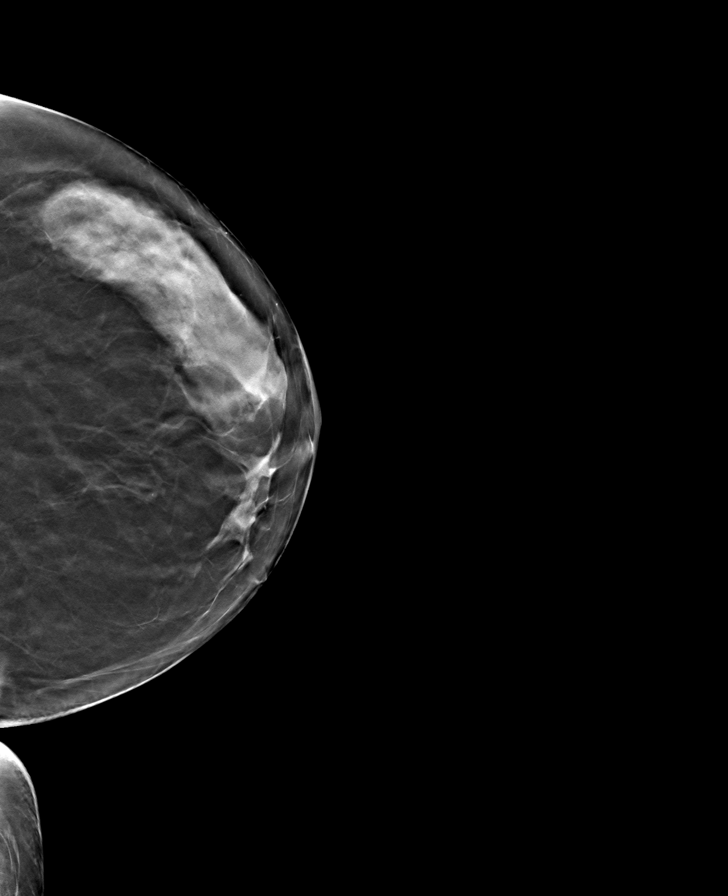

[R MLO tomo · tomo slice 33/64.0]
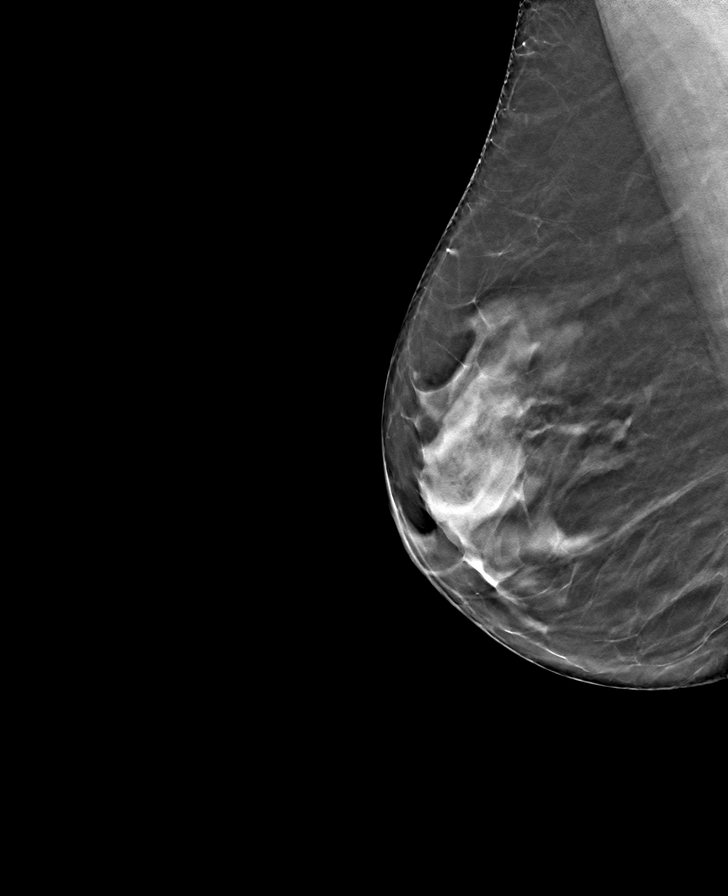

[R CC tomo · tomo slice 29/56.0]
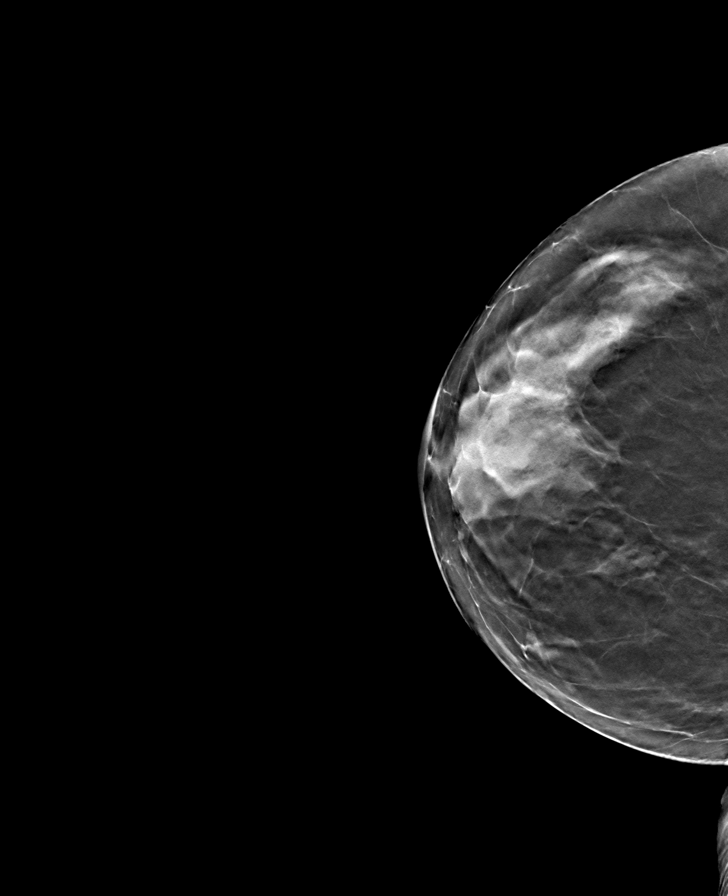

[8 of 24 positions shown; findings below may reference images not displayed]

ACR Breast Density Category c: The breast tissue is heterogeneously
dense, which may obscure small masses.
FINDINGS: There are no findings suspicious for malignancy. Images were
processed with CAD.
IMPRESSION: No mammographic evidence of malignancy. A result letter of this
screening mammogram will be mailed directly to the patient.

RECOMMENDATION:
Screening mammogram in one year. (Code:[id number])

BI-RADS CATEGORY  1: Negative.

## 2019-09-06 IMAGING — CR DG OR LOCAL ABDOMEN
1 series · 1 of 1 positions shown · non-contrast
Comparison: 09/02/2018

CLINICAL DATA: Status post L5-S1 fusion

EXAM:
OR LOCAL ABDOMEN

[AP]
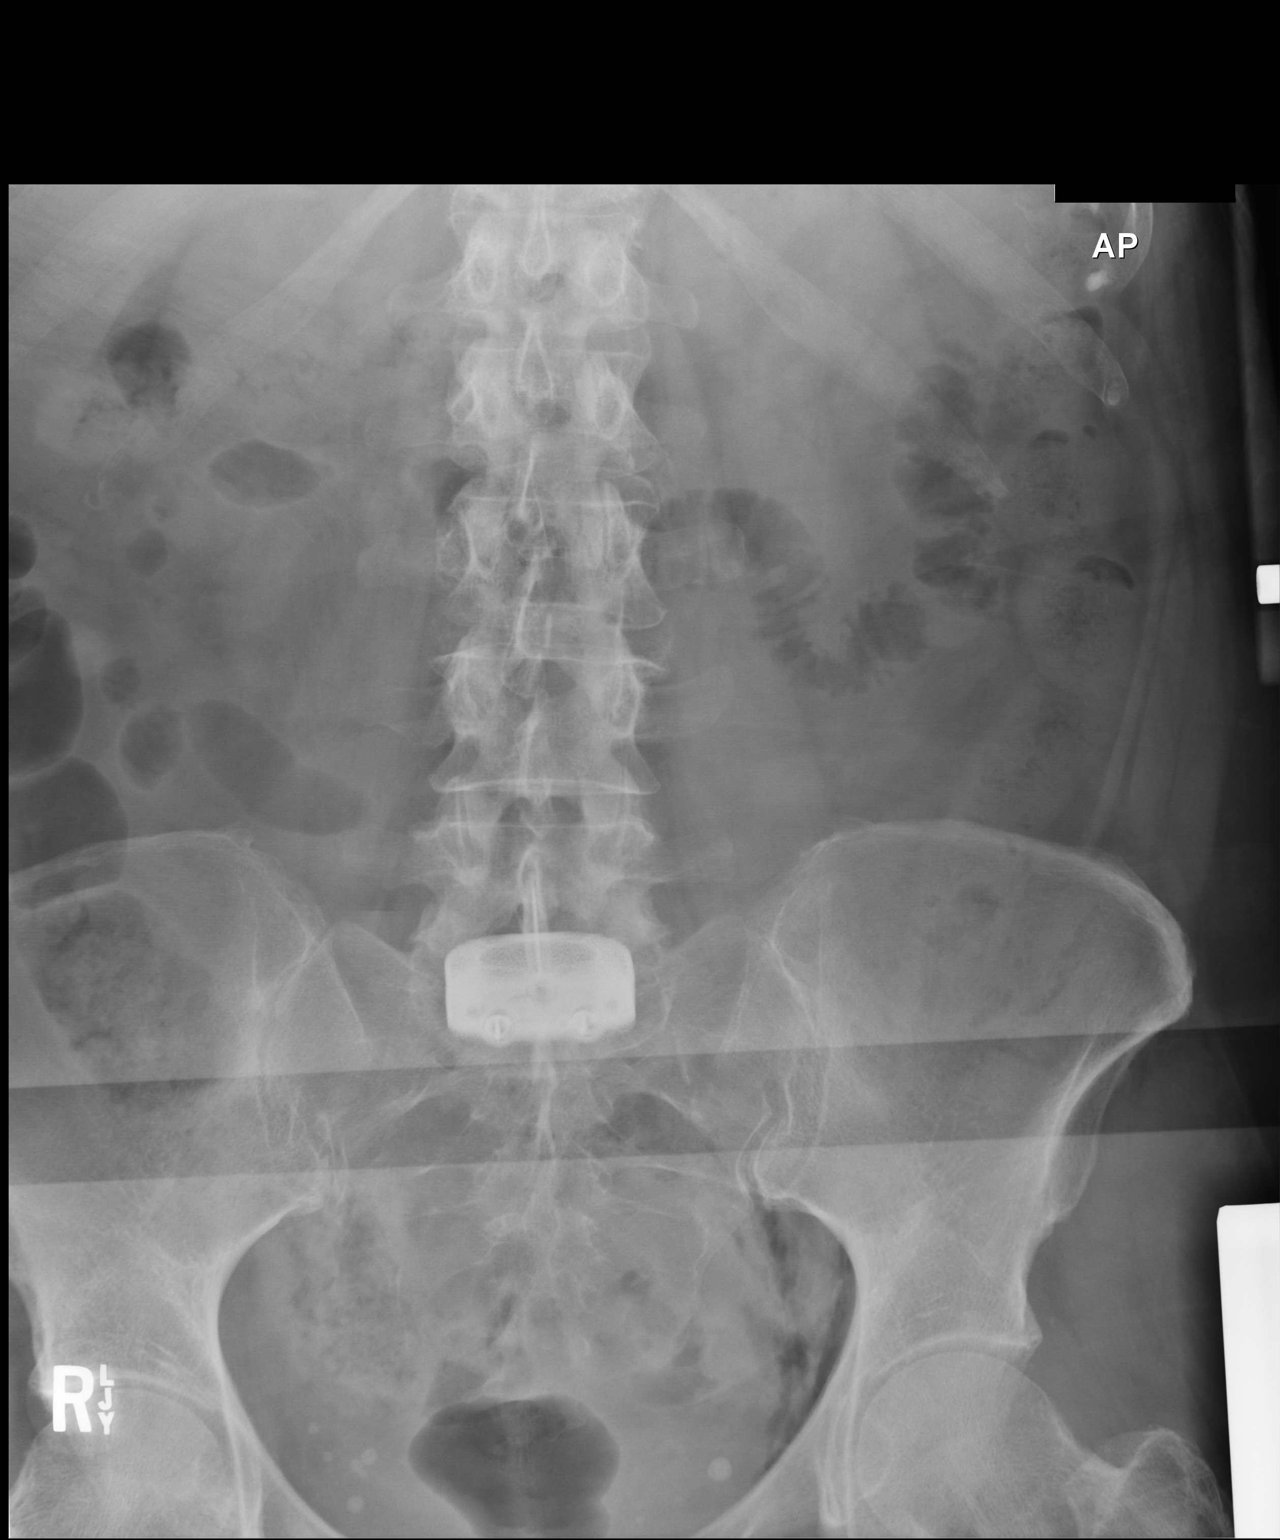

[1 of 1 positions shown; findings below may reference images not displayed]

FINDINGS: Postsurgical changes are noted at L5-S1. No radiopaque foreign body
is noted. No acute bony abnormality is seen. No obstructive pattern
is noted.
IMPRESSION: Postsurgical changes are noted.

No foreign body is noted.

Critical Value/emergent results were called by telephone at the time
of interpretation on 11/10/2018 at [DATE] to Algiasas Piaciukonis nurse in
[place] [facility] , who verbally acknowledged these results.

## 2019-09-06 IMAGING — RF DG LUMBAR SPINE 2-3V
1 series · 5 of 5 positions shown · non-contrast
Comparison: Lumbar spine radiographs - 09/02/2018

CLINICAL DATA: Anterior fusion of L5-S1.

EXAM:
DG C-ARM 61-120 MIN; LUMBAR SPINE - 2-3 VIEW
FLUOROSCOPY TIME:  6 seconds

[Series 1: run · 5 of 5 slices shown]
[im 1/5]
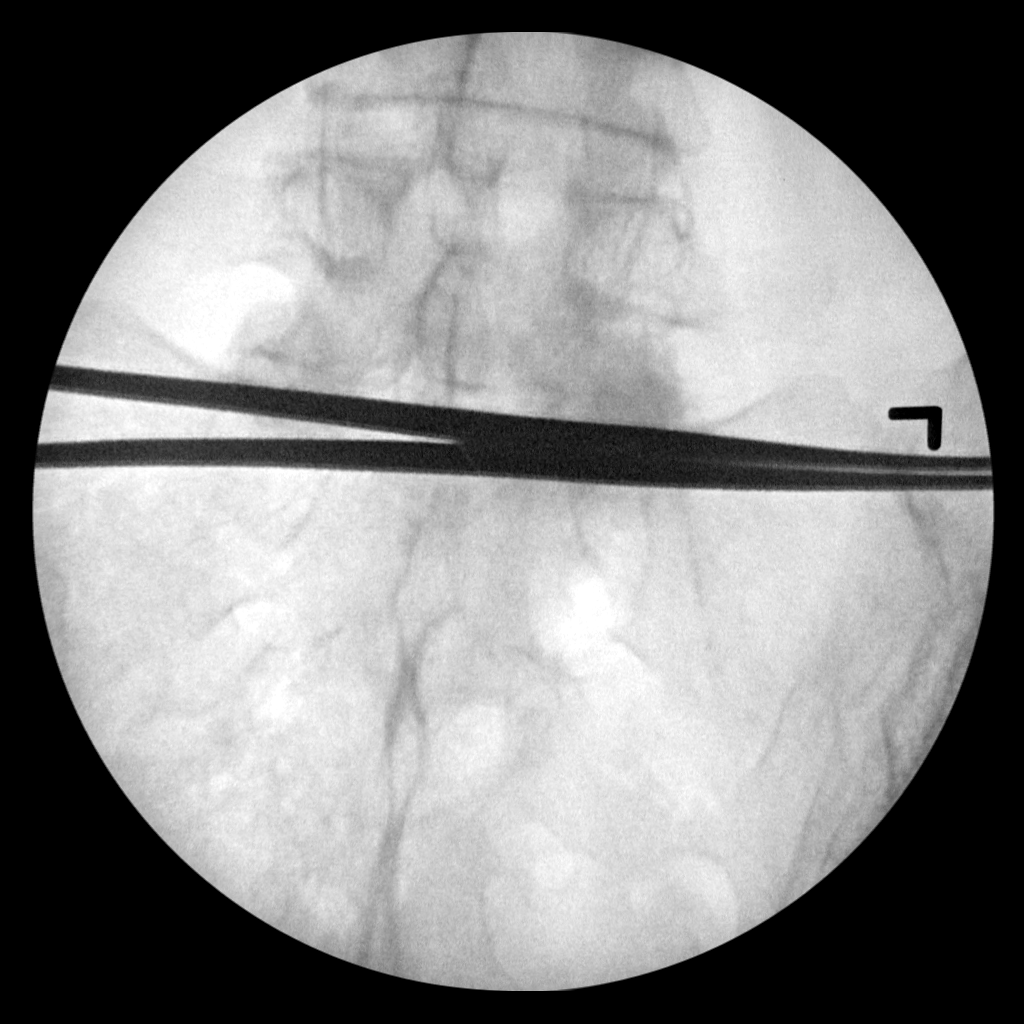
[im 2/5]
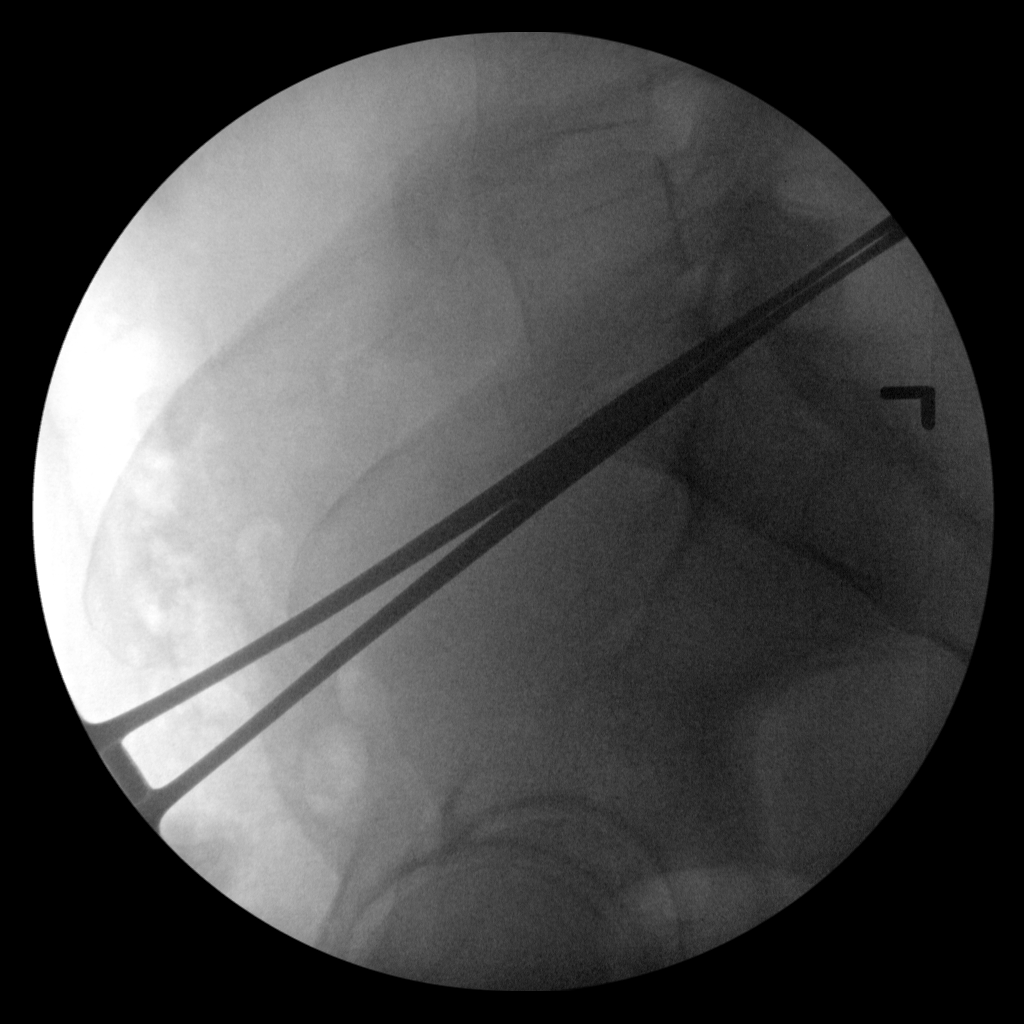
[im 3/5]
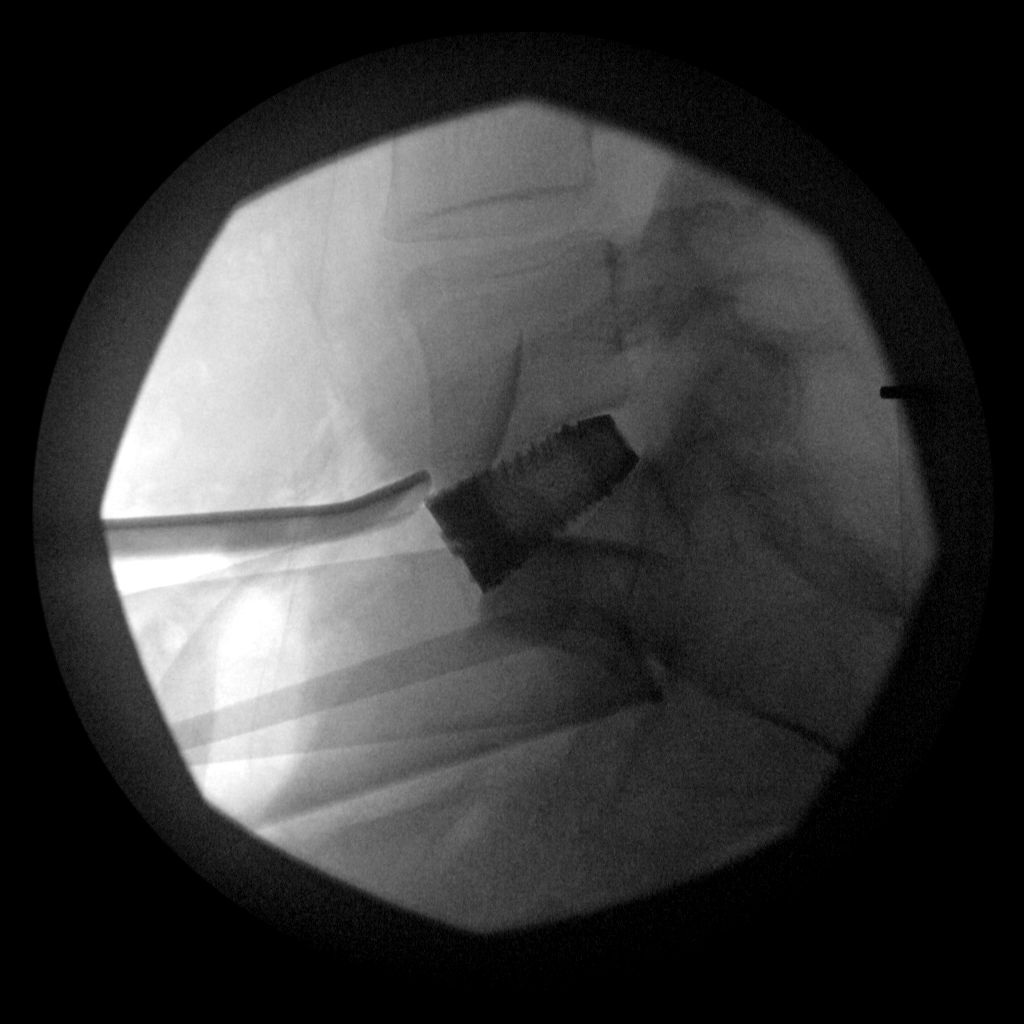
[im 4/5]
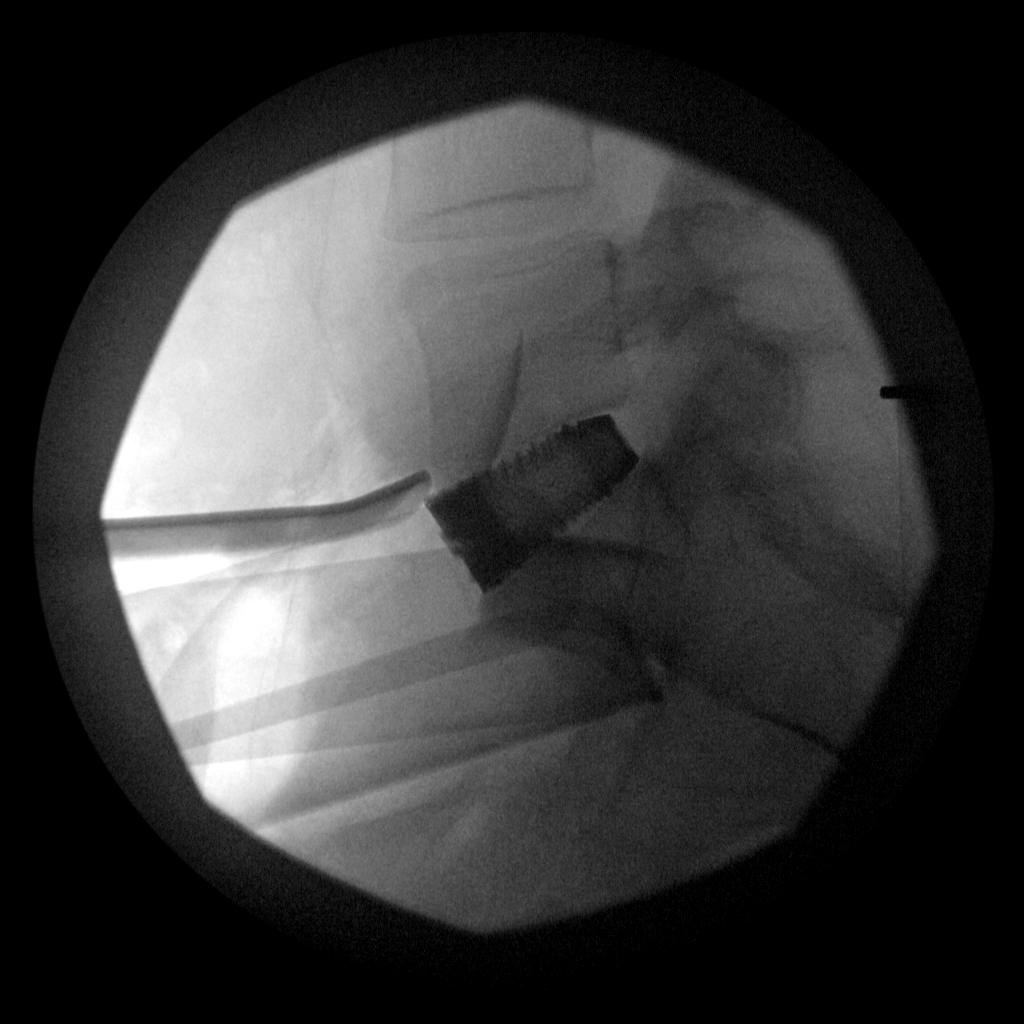
[im 5/5]
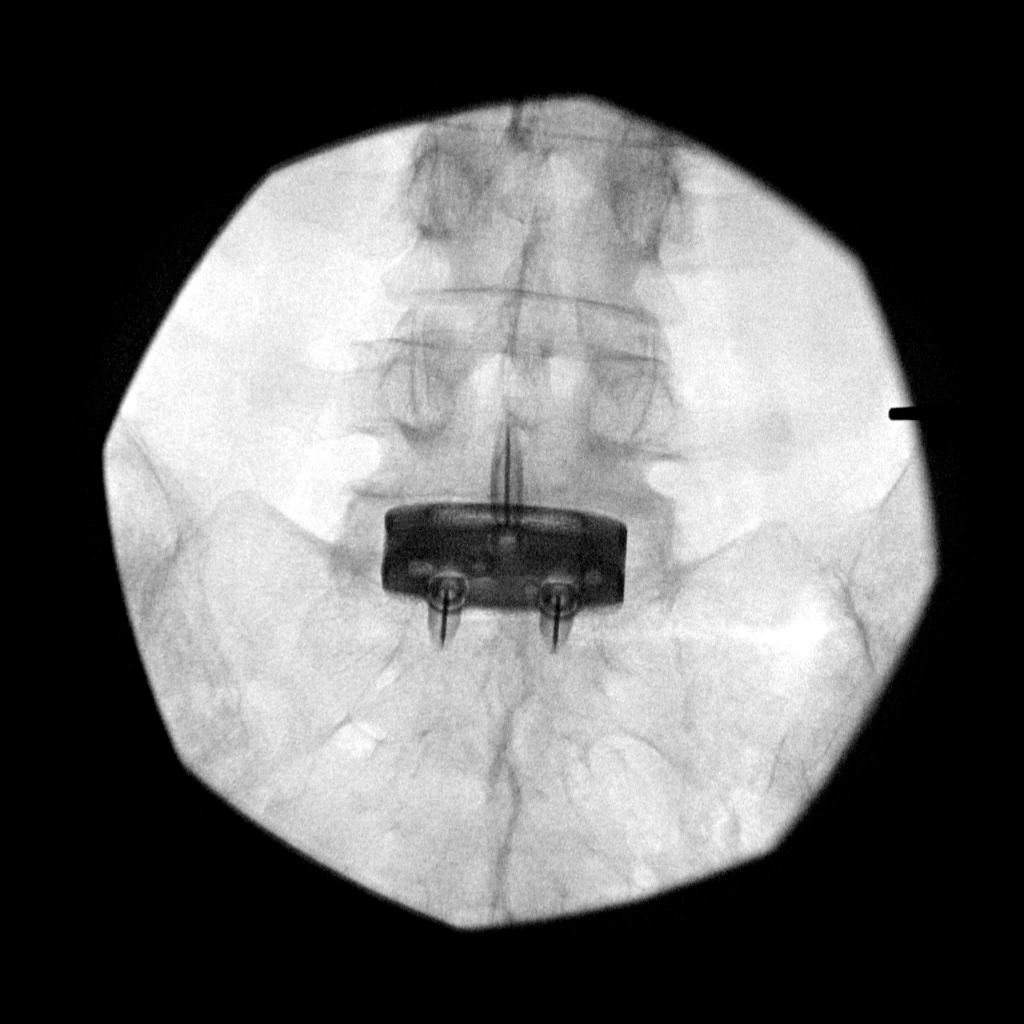

[5 of 5 positions shown; findings below may reference images not displayed]

FINDINGS: Spinal labeling is based on the presumption of 5 non rib-bearing
lumbar type vertebral bodies

Initial image demonstrates a radiopaque marking instrument overlying
soft tissues about the L5-S1 intervertebral disc space.

Subsequent images demonstrate the sequela of L5-S1 intervertebral
disc space replacement with restoration of the L5-S1 intervertebral
disc space height. Radiopaque surgical support apparatus is noted
about the operative site. No radiopaque body.
IMPRESSION: Post L5-S1 intervertebral disc space replacement without evidence
complication.

## 2019-09-14 ENCOUNTER — Other Ambulatory Visit: Payer: Self-pay

## 2019-09-14 ENCOUNTER — Ambulatory Visit
Admission: EM | Admit: 2019-09-14 | Discharge: 2019-09-14 | Disposition: A | Discharge status: Home / Self Care | Payer: Medicare Other | Attending: Family Medicine | Admitting: Family Medicine

## 2019-09-14 ENCOUNTER — Encounter: Payer: Self-pay | Admitting: Emergency Medicine

## 2019-09-14 DIAGNOSIS — M7552 Bursitis of left shoulder: Secondary | ICD-10-CM

## 2019-09-14 NOTE — ED Provider Notes (Signed)
[facility]    CSN: [id number] Arrival date & time: 09/14/19  1713  History   Chief Complaint Chief Complaint  Patient presents with  . Shoulder Pain   HPI  73 year old female presents with left shoulder pain.  Left shoulder pain  Started abruptly this am.  No fall, trauma, or injury.  No reported swelling.  Pain with ROM and decreased ROM.  Pain currently 3/10 in severity. Pain worse with ROM.  She has taken Motrin without relief.  No relieving factors.  PMH, Surgical Hx, Family Hx, Social History reviewed and updated as below.  Past Medical History:  Diagnosis Date  . Arthritis   . Carotid arterial disease ([place])    a. 11/2017 Carotid U/S: RICA 123456, LICA 123456.   . Chronic fatigue   . Chronic leg pain   . DDD (degenerative disc disease), lumbar   . Dyspnea on exertion    a. 02/2017 Echo: EF 60-65%, no rwma, mild MR. Nl RV size/fxn. Nl PASP; b. 02/2017 MV: small, mild, fixed basal inferolateral defect - ? artifact vs scar. No ischemia. EF >65%.  . Hypercholesterolemia   . Hypertension   . Insomnia   . Palpitations    a. 02/2017 Holter: Avg HR 66 (51-115), rare PACs, four brief runs of PAT - up to 5 beats, max rate 157. Rare isolated PVCs. No sustained arrhythmias; b. 03/2018 24h Holter: Rare PACs, 3 beat run PAT (122 bpm), PVCs (2% of beats).  . Radiculopathy    Lumbar region  . Sinus bradycardia   . Spondylolisthesis    lumbosacral region    Patient Active Problem List   Diagnosis Date Noted  . Bilateral leg pain 08/13/2019  . Sleeping difficulties 06/29/2019  . Left foot pain 05/30/2019  . Hyperglycemia 05/30/2019  . PAC (premature atrial contraction) 01/27/2019  . PVC (premature ventricular contraction) 01/27/2019  . Leg swelling 01/27/2019  . Bilateral carotid artery stenosis 01/27/2019  . Degenerative spondylolisthesis 11/10/2018  . Decreased sense of taste 10/12/2018  . Impingement syndrome, shoulder, right 04/03/2018  . Swelling  of right hand 11/29/2017  . Fatigue 11/29/2017  . Palpitations 06/11/2017  . Dyspnea on exertion 09/30/2016  . Carotid stenosis 09/17/2015  . Back pain 08/13/2015  . Health care maintenance 02/08/2015  . Stress 07/03/2014  . Arthritis, degenerative 05/03/2014  . Right knee pain 03/27/2014  . Menopausal symptoms 02/27/2014  . Dizziness 01/08/2014  . Right shoulder pain 04/26/2013  . Hypertension 08/23/2012  . Hyperlipidemia LDL goal <70 08/23/2012    Past Surgical History:  Procedure Laterality Date  . ABDOMINAL EXPOSURE N/A 11/10/2018   Procedure: ABDOMINAL EXPOSURE;  Surgeon: Angelia Mould, MD;  Location: [place];  Service: Vascular;  Laterality: N/A;  . ABDOMINAL HYSTERECTOMY  1975  . ANTERIOR CERVICAL DECOMP/DISCECTOMY FUSION N/A 04/09/2013   Procedure: ANTERIOR CERVICAL DECOMPRESSION/DISCECTOMY FUSION 2 LEVELS;  Surgeon: Floyce Stakes, MD;  Location: [facility];  Service: Neurosurgery;  Laterality: N/A;  Cervical five-six Cervical six-seven  Anterior cervical decompression/diskectomy/fusion  . ANTERIOR LUMBAR FUSION N/A 11/10/2018   Procedure: Anterior Lumbar Interbody Fusion-Lumbar five-Sacral one;  Surgeon: Earnie Larsson, MD;  Location: [place];  Service: Neurosurgery;  Laterality: N/A;  . BACK SURGERY     ruptured disc  . RECONSTRUCTION OF NOSE      OB History   No obstetric history on file.      Home Medications    Prior to Admission medications   Medication Sig Start Date End Date  Taking? Authorizing Provider  amLODipine (NORVASC) 5 MG tablet Take 1 tablet (5 mg total) by mouth daily. 10/29/18  Yes Dunn, Areta Haber, PA-C  aspirin EC 81 MG tablet Take 81 mg by mouth daily.   Yes [provider]  losartan-hydrochlorothiazide (HYZAAR) 100-25 MG tablet Take 1 tablet by mouth daily. 08/05/19  Yes Einar Pheasant, MD  Multiple Vitamins-Minerals (CENTRUM PO) Take 1 tablet by mouth daily.    Yes [provider]  omeprazole (PRILOSEC) 20 MG capsule TAKE  (1) CAPSULE BY MOUTH TWICE DAILY FOR REFLUX 08/05/19  Yes Einar Pheasant, MD  rosuvastatin (CRESTOR) 20 MG tablet Take 1 tablet (20 mg total) by mouth daily. 08/16/19 11/14/19 Yes End, Harrell Gave, MD    Family History Family History  Problem Relation Age of Onset  . Cancer Mother        ovarian/uterine  . Heart disease Mother   . Heart attack Mother   . Stroke Mother   . Colon cancer Father   . Dementia Father   . COPD Brother   . Congestive Heart Failure Brother   . Stroke Daughter   . Heart attack Daughter   . Suicidality Brother   . Breast cancer Neg Hx     Social History Social History   Tobacco Use  . Smoking status: Never Smoker  . Smokeless tobacco: Never Used  Substance Use Topics  . Alcohol use: Yes    Alcohol/week: 0.0 standard drinks    Comment: rarely  . Drug use: No     Allergies   Patient has no known allergies.   Review of Systems Review of Systems  Constitutional: Negative.   Musculoskeletal:       Left shoulder pain.   Physical Exam Triage Vital Signs ED Triage Vitals  Enc Vitals Group     BP 09/14/19 1729 135/70     Pulse Rate 09/14/19 1729 70     Resp 09/14/19 1729 18     Temp 09/14/19 1729 98.2 F (36.8 C)     Temp Source 09/14/19 1729 Oral     SpO2 09/14/19 1729 99 %     Weight 09/14/19 1730 144 lb (65.3 kg)     Height 09/14/19 1730 5\' 4"  (1.626 m)     Head Circumference --      Peak Flow --      Pain Score 09/14/19 1730 3     Pain Loc --      Pain Edu? --      Excl. in [place]? --    Updated Vital Signs BP 135/70 (BP Location: Right Arm)   Pulse 70   Temp 98.2 F (36.8 C) (Oral)   Resp 18   Ht 5\' 4"  (1.626 m)   Wt 65.3 kg   SpO2 99%   BMI 24.72 kg/m   Visual Acuity Right Eye Distance:   Left Eye Distance:   Bilateral Distance:    Right Eye Near:   Left Eye Near:    Bilateral Near:     Physical Exam Vitals signs and nursing note reviewed.  Constitutional:      General: She is not in acute distress.     Appearance: Normal appearance. She is not ill-appearing.  HENT:     Head: Normocephalic and atraumatic.  Cardiovascular:     Rate and Rhythm: Normal rate and regular rhythm.  Pulmonary:     Effort: Pulmonary effort is normal.     Breath sounds: Normal breath sounds. No wheezing, rhonchi or rales.  Musculoskeletal:     Comments: Left shoulder pain: Inspection reveals no abnormalities, atrophy or asymmetry. Palpation with tenderness diffusely. ROM decreased in all planes. Cannot test rotator cuff strength do to significant pain and markedly decreased ROM.  Skin:    General: Skin is warm.     Findings: No bruising.  Neurological:     Mental Status: She is alert.  Psychiatric:        Mood and Affect: Mood normal.        Behavior: Behavior normal.    UC Treatments / Results  Labs (all labs ordered are listed, but only abnormal results are displayed) Labs Reviewed - No data to display  EKG   Radiology No results found.  Procedures Procedures (including critical care time) Procedure: Subacromial bursa injection Verbal consent given. Medication:  2 mL Solumedrol, 6 mL Lidocaine 1% without epi Preparation: area cleansed with alcohol x 3  Injection:  Landmarks identified Above medication injected into joint space using a posterior approach. Patient tolerated well without bleeding or paresthesias.   Medications Ordered in UC Medications - No data to display  Initial Impression / Assessment and Plan / UC Course  I have reviewed the triage vital signs and the nursing notes.  Pertinent labs & imaging results that were available during my care of the patient were reviewed by me and considered in my medical decision making (see chart for details).    73 year old female presents with left shoulder bursitis. Injection given today (see above). Ice/Heat. Tramadol as needed. If persists, see Ortho.  Final Clinical Impressions(s) / UC Diagnoses   Final diagnoses:  Subacromial  bursitis of left shoulder joint     Discharge Instructions     Rest.  Ice and Heat.  Tramadol if needed.  If persists, see Orthopedics.  Take care  Dr. Lacinda Axon    ED Prescriptions    None     PDMP not reviewed this encounter.   Coral Spikes, [place] 09/14/19 2236

## 2019-09-14 NOTE — Discharge Instructions (Signed)
Rest.  Ice and Heat.  Tramadol if needed.  If persists, see Orthopedics.  Take care  Dr. Lacinda Axon

## 2019-09-14 NOTE — ED Triage Notes (Signed)
Patient c/o left shoulder pain that started this morning. She denies injury.

## 2019-10-05 ENCOUNTER — Other Ambulatory Visit: Payer: Self-pay | Admitting: Internal Medicine

## 2019-10-05 DIAGNOSIS — Z1231 Encounter for screening mammogram for malignant neoplasm of breast: Secondary | ICD-10-CM

## 2019-10-13 ENCOUNTER — Ambulatory Visit
Admission: EM | Admit: 2019-10-13 | Discharge: 2019-10-13 | Disposition: A | Discharge status: Home / Self Care | Payer: Medicare Other | Attending: Family Medicine | Admitting: Family Medicine

## 2019-10-13 ENCOUNTER — Encounter: Payer: Self-pay | Admitting: Emergency Medicine

## 2019-10-13 ENCOUNTER — Other Ambulatory Visit: Payer: Self-pay

## 2019-10-13 DIAGNOSIS — M7551 Bursitis of right shoulder: Secondary | ICD-10-CM | POA: Diagnosis not present

## 2019-10-13 DIAGNOSIS — M25511 Pain in right shoulder: Secondary | ICD-10-CM

## 2019-10-13 MED ORDER — LIDOCAINE HCL (PF) 1 % IJ SOLN
4.0000 mL | Freq: Once | INTRAMUSCULAR | Status: AC
  Administered 2019-10-13: 4 mL

## 2019-10-13 MED ORDER — METHYLPREDNISOLONE SODIUM SUCC 40 MG IJ SOLR
80.0000 mg | Freq: Once | INTRAMUSCULAR | Status: AC
  Administered 2019-10-13: 80 mg via INTRAMUSCULAR

## 2019-10-13 NOTE — ED Triage Notes (Signed)
Patient c/o right shoulder pain that started 1 month ago. Denies injury.

## 2019-10-13 NOTE — Discharge Instructions (Signed)
Rest.  Ice.  If recurs or persists, see [facility] [phone]) or [facility] [phone])  Take care  Dr. Lacinda Axon

## 2019-10-13 NOTE — ED Provider Notes (Signed)
[facility]    CSN: [id number] Arrival date & time: 10/13/19  [id number]      History   Chief Complaint Chief Complaint  Patient presents with  . Shoulder Pain   HPI  73 year old female presents with right shoulder pain.  1 month history of right shoulder pain.  No fall, trauma, injury.  Reports moderate pain, 5/10 in severity.  Associated decreased range of motion.  Patient states that she has difficulty putting on her close and combing her hair.  No relieving factors.  Has a history of arthritis.  Patient was recently seen by me for a similar problem of her left shoulder.  Exacerbated by range of motion and activity.  No relieving factors.  No other complaints.  PMH, Surgical Hx, Family Hx, Social History reviewed and updated as below.  Past Medical History:  Diagnosis Date  . Arthritis   . Carotid arterial disease ([place])    a. 11/2017 Carotid U/S: RICA 123456, LICA 123456.   . Chronic fatigue   . Chronic leg pain   . DDD (degenerative disc disease), lumbar   . Dyspnea on exertion    a. 02/2017 Echo: EF 60-65%, no rwma, mild MR. Nl RV size/fxn. Nl PASP; b. 02/2017 MV: small, mild, fixed basal inferolateral defect - ? artifact vs scar. No ischemia. EF >65%.  . Hypercholesterolemia   . Hypertension   . Insomnia   . Palpitations    a. 02/2017 Holter: Avg HR 66 (51-115), rare PACs, four brief runs of PAT - up to 5 beats, max rate 157. Rare isolated PVCs. No sustained arrhythmias; b. 03/2018 24h Holter: Rare PACs, 3 beat run PAT (122 bpm), PVCs (2% of beats).  . Radiculopathy    Lumbar region  . Sinus bradycardia   . Spondylolisthesis    lumbosacral region    Patient Active Problem List   Diagnosis Date Noted  . Bilateral leg pain 08/13/2019  . Sleeping difficulties 06/29/2019  . Left foot pain 05/30/2019  . Hyperglycemia 05/30/2019  . PAC (premature atrial contraction) 01/27/2019  . PVC (premature ventricular contraction) 01/27/2019  . Leg swelling 01/27/2019  .  Bilateral carotid artery stenosis 01/27/2019  . Degenerative spondylolisthesis 11/10/2018  . Decreased sense of taste 10/12/2018  . Impingement syndrome, shoulder, right 04/03/2018  . Swelling of right hand 11/29/2017  . Fatigue 11/29/2017  . Palpitations 06/11/2017  . Dyspnea on exertion 09/30/2016  . Carotid stenosis 09/17/2015  . Back pain 08/13/2015  . Health care maintenance 02/08/2015  . Stress 07/03/2014  . Arthritis, degenerative 05/03/2014  . Right knee pain 03/27/2014  . Menopausal symptoms 02/27/2014  . Dizziness 01/08/2014  . Right shoulder pain 04/26/2013  . Hypertension 08/23/2012  . Hyperlipidemia LDL goal <70 08/23/2012    Past Surgical History:  Procedure Laterality Date  . ABDOMINAL EXPOSURE N/A 11/10/2018   Procedure: ABDOMINAL EXPOSURE;  Surgeon: Angelia Mould, MD;  Location: [place];  Service: Vascular;  Laterality: N/A;  . ABDOMINAL HYSTERECTOMY  1975  . ANTERIOR CERVICAL DECOMP/DISCECTOMY FUSION N/A 04/09/2013   Procedure: ANTERIOR CERVICAL DECOMPRESSION/DISCECTOMY FUSION 2 LEVELS;  Surgeon: Floyce Stakes, MD;  Location: [facility];  Service: Neurosurgery;  Laterality: N/A;  Cervical five-six Cervical six-seven  Anterior cervical decompression/diskectomy/fusion  . ANTERIOR LUMBAR FUSION N/A 11/10/2018   Procedure: Anterior Lumbar Interbody Fusion-Lumbar five-Sacral one;  Surgeon: Earnie Larsson, MD;  Location: [place];  Service: Neurosurgery;  Laterality: N/A;  . BACK SURGERY     ruptured disc  . RECONSTRUCTION OF  NOSE      OB History   No obstetric history on file.      Home Medications    Prior to Admission medications   Medication Sig Start Date End Date Taking? Authorizing Provider  aspirin EC 81 MG tablet Take 81 mg by mouth daily.   Yes [provider]  losartan-hydrochlorothiazide (HYZAAR) 100-25 MG tablet Take 1 tablet by mouth daily. 08/05/19  Yes Einar Pheasant, MD  Multiple Vitamins-Minerals (CENTRUM PO) Take 1 tablet by  mouth daily.    Yes [provider]  omeprazole (PRILOSEC) 20 MG capsule TAKE (1) CAPSULE BY MOUTH TWICE DAILY FOR REFLUX 08/05/19  Yes Einar Pheasant, MD  rosuvastatin (CRESTOR) 20 MG tablet Take 1 tablet (20 mg total) by mouth daily. 08/16/19 11/14/19 Yes End, Harrell Gave, MD  amLODipine (NORVASC) 5 MG tablet Take 1 tablet (5 mg total) by mouth daily. 10/29/18   Rise Mu, PA-C    Family History Family History  Problem Relation Age of Onset  . Cancer Mother        ovarian/uterine  . Heart disease Mother   . Heart attack Mother   . Stroke Mother   . Colon cancer Father   . Dementia Father   . COPD Brother   . Congestive Heart Failure Brother   . Stroke Daughter   . Heart attack Daughter   . Suicidality Brother   . Breast cancer Neg Hx     Social History Social History   Tobacco Use  . Smoking status: Never Smoker  . Smokeless tobacco: Never Used  Substance Use Topics  . Alcohol use: Yes    Alcohol/week: 0.0 standard drinks    Comment: rarely  . Drug use: No     Allergies   Patient has no known allergies.   Review of Systems Review of Systems  Constitutional: Negative.   Musculoskeletal:       Right shoulder pain.   Physical Exam Triage Vital Signs ED Triage Vitals  Enc Vitals Group     BP 10/13/19 1027 (!) 114/52     Pulse Rate 10/13/19 1027 60     Resp 10/13/19 1027 18     Temp 10/13/19 1027 98.2 F (36.8 C)     Temp Source 10/13/19 1027 Oral     SpO2 10/13/19 1027 99 %     Weight 10/13/19 1025 144 lb (65.3 kg)     Height 10/13/19 1025 5\' 4"  (1.626 m)     Head Circumference --      Peak Flow --      Pain Score 10/13/19 1024 5     Pain Loc --      Pain Edu? --      Excl. in [place]? --    Updated Vital Signs BP (!) 114/52 (BP Location: Right Arm)   Pulse 60   Temp 98.2 F (36.8 C) (Oral)   Resp 18   Ht 5\' 4"  (1.626 m)   Wt 65.3 kg   SpO2 99%   BMI 24.72 kg/m   Visual Acuity Right Eye Distance:   Left Eye Distance:   Bilateral  Distance:    Right Eye Near:   Left Eye Near:    Bilateral Near:     Physical Exam Vitals and nursing note reviewed.  Constitutional:      General: She is not in acute distress.    Appearance: Normal appearance. She is not ill-appearing.  HENT:     Head: Normocephalic and atraumatic.  Eyes:     General:        Right eye: No discharge.        Left eye: No discharge.     Conjunctiva/sclera: Conjunctivae normal.  Cardiovascular:     Rate and Rhythm: Normal rate and regular rhythm.     Heart sounds: No murmur.  Pulmonary:     Effort: Pulmonary effort is normal.     Breath sounds: Normal breath sounds. No wheezing, rhonchi or rales.  Musculoskeletal:     Comments: Right shoulder -markedly decreased range of motion in all planes.  Positive empty can.  Positive [place].  Neurological:     Mental Status: She is alert.  Psychiatric:        Mood and Affect: Mood normal.        Behavior: Behavior normal.    UC Treatments / Results  Labs (all labs ordered are listed, but only abnormal results are displayed) Labs Reviewed - No data to display  EKG   Radiology No results found.  Procedures Procedures (including critical care time) Procedure: Right subacromial bursa injection Consent signed and scanned into record. Medication:  80 mg Solumedrol; 4 mL Lidocaine 1% without epi Preparation: area cleansed with alcohol and betadine  Injection: Landmarks identified Above medication injected using a standard posterior approach. Patient tolerated well without bleeding or paresthesias    Medications Ordered in UC Medications  methylPREDNISolone sodium succinate (SOLU-MEDROL) 40 mg/mL injection 80 mg (has no administration in time range)  lidocaine (PF) (XYLOCAINE) 1 % injection 4 mL (4 mLs Other Given 10/13/19 1051)    Initial Impression / Assessment and Plan / UC Course  I have reviewed the triage vital signs and the nursing notes.  Pertinent labs & imaging results that  were available during my care of the patient were reviewed by me and considered in my medical decision making (see chart for details).    73 year old female presents with acute right shoulder pain.  Suspect bursitis versus rotator cuff pathology.  Shoulder injection given today.  See above.  Advised to follow-up with orthopedics.  Final Clinical Impressions(s) / UC Diagnoses   Final diagnoses:  Acute pain of right shoulder     Discharge Instructions     Rest.  Ice.  If recurs or persists, see [facility] [phone]) or [facility] [phone])  Take care  Dr. Lacinda Axon    ED Prescriptions    None     PDMP not reviewed this encounter.   Coral Spikes, [place] 10/13/19 1103

## 2019-10-28 ENCOUNTER — Other Ambulatory Visit: Payer: Self-pay

## 2019-10-28 ENCOUNTER — Ambulatory Visit
Admission: EM | Admit: 2019-10-28 | Discharge: 2019-10-28 | Disposition: A | Discharge status: Home / Self Care | Payer: Medicare Other | Attending: Family Medicine | Admitting: Family Medicine

## 2019-10-28 ENCOUNTER — Telehealth: Payer: Self-pay

## 2019-10-28 ENCOUNTER — Encounter: Payer: Self-pay | Admitting: Emergency Medicine

## 2019-10-28 DIAGNOSIS — B029 Zoster without complications: Secondary | ICD-10-CM

## 2019-10-28 MED ORDER — VALACYCLOVIR HCL 1 G PO TABS: 1000.0000 mg | ORAL_TABLET | Freq: Three times a day (TID) | ORAL | 0 refills | Status: DC

## 2019-10-28 NOTE — ED Triage Notes (Signed)
Patient in today c/o rash on her left foot and left leg x 3 days, getting worse. Patient tested + for covid 10/23/19.

## 2019-10-28 NOTE — Telephone Encounter (Signed)
I do not scheduling a visit with her, but make sure she is aware of limitations - since unable to see rash.  If she would prefer direct visualization, then urgent care.

## 2019-10-28 NOTE — ED Provider Notes (Signed)
[facility]    CSN: [id number] Arrival date & time: 10/28/19  1654  History   Chief Complaint Chief Complaint  Patient presents with  . Rash    APPT  . covid +   HPI  74 year old female presents with rash.  Patient reports a 3-day history of rash.  States that she has areas on her buttocks and left posterior thigh, and left foot.  Raised, red, and painful.  Describes the pain as a burning sensation.  5/10 in severity.  Patient believes that she has shingles.  No medications or interventions tried.  No known inciting factor.  No exacerbating factors.  No other complaints.  PMH, Surgical Hx, Family Hx, Social History reviewed and updated as below.  Past Medical History:  Diagnosis Date  . Arthritis   . Carotid arterial disease ([place])    a. 11/2017 Carotid U/S: RICA 123456, LICA 123456.   . Chronic fatigue   . Chronic leg pain   . DDD (degenerative disc disease), lumbar   . Dyspnea on exertion    a. 02/2017 Echo: EF 60-65%, no rwma, mild MR. Nl RV size/fxn. Nl PASP; b. 02/2017 MV: small, mild, fixed basal inferolateral defect - ? artifact vs scar. No ischemia. EF >65%.  . Hypercholesterolemia   . Hypertension   . Insomnia   . Palpitations    a. 02/2017 Holter: Avg HR 66 (51-115), rare PACs, four brief runs of PAT - up to 5 beats, max rate 157. Rare isolated PVCs. No sustained arrhythmias; b. 03/2018 24h Holter: Rare PACs, 3 beat run PAT (122 bpm), PVCs (2% of beats).  . Radiculopathy    Lumbar region  . Sinus bradycardia   . Spondylolisthesis    lumbosacral region    Patient Active Problem List   Diagnosis Date Noted  . Bilateral leg pain 08/13/2019  . Sleeping difficulties 06/29/2019  . Left foot pain 05/30/2019  . Hyperglycemia 05/30/2019  . PAC (premature atrial contraction) 01/27/2019  . PVC (premature ventricular contraction) 01/27/2019  . Leg swelling 01/27/2019  . Bilateral carotid artery stenosis 01/27/2019  . Degenerative spondylolisthesis 11/10/2018    . Decreased sense of taste 10/12/2018  . Impingement syndrome, shoulder, right 04/03/2018  . Swelling of right hand 11/29/2017  . Fatigue 11/29/2017  . Palpitations 06/11/2017  . Dyspnea on exertion 09/30/2016  . Carotid stenosis 09/17/2015  . Back pain 08/13/2015  . Health care maintenance 02/08/2015  . Stress 07/03/2014  . Arthritis, degenerative 05/03/2014  . Right knee pain 03/27/2014  . Menopausal symptoms 02/27/2014  . Dizziness 01/08/2014  . Right shoulder pain 04/26/2013  . Hypertension 08/23/2012  . Hyperlipidemia LDL goal <70 08/23/2012    Past Surgical History:  Procedure Laterality Date  . ABDOMINAL EXPOSURE N/A 11/10/2018   Procedure: ABDOMINAL EXPOSURE;  Surgeon: Angelia Mould, MD;  Location: [place];  Service: Vascular;  Laterality: N/A;  . ABDOMINAL HYSTERECTOMY  1975  . ANTERIOR CERVICAL DECOMP/DISCECTOMY FUSION N/A 04/09/2013   Procedure: ANTERIOR CERVICAL DECOMPRESSION/DISCECTOMY FUSION 2 LEVELS;  Surgeon: Floyce Stakes, MD;  Location: [facility];  Service: Neurosurgery;  Laterality: N/A;  Cervical five-six Cervical six-seven  Anterior cervical decompression/diskectomy/fusion  . ANTERIOR LUMBAR FUSION N/A 11/10/2018   Procedure: Anterior Lumbar Interbody Fusion-Lumbar five-Sacral one;  Surgeon: Earnie Larsson, MD;  Location: [place];  Service: Neurosurgery;  Laterality: N/A;  . BACK SURGERY     ruptured disc  . RECONSTRUCTION OF NOSE      OB History   No obstetric history  on file.      Home Medications    Prior to Admission medications   Medication Sig Start Date End Date Taking? Authorizing Provider  amLODipine (NORVASC) 5 MG tablet Take 1 tablet (5 mg total) by mouth daily. 10/29/18  Yes Dunn, Areta Haber, PA-C  aspirin EC 81 MG tablet Take 81 mg by mouth daily.   Yes [provider]  losartan-hydrochlorothiazide (HYZAAR) 100-25 MG tablet Take 1 tablet by mouth daily. 08/05/19  Yes Einar Pheasant, MD  Multiple Vitamins-Minerals (CENTRUM PO)  Take 1 tablet by mouth daily.    Yes [provider]  omeprazole (PRILOSEC) 20 MG capsule TAKE (1) CAPSULE BY MOUTH TWICE DAILY FOR REFLUX 08/05/19  Yes Einar Pheasant, MD  rosuvastatin (CRESTOR) 20 MG tablet Take 1 tablet (20 mg total) by mouth daily. 08/16/19 11/14/19 Yes End, Harrell Gave, MD  valACYclovir (VALTREX) 1000 MG tablet Take 1 tablet (1,000 mg total) by mouth 3 (three) times daily. 10/28/19   Coral Spikes, DO    Family History Family History  Problem Relation Age of Onset  . Cancer Mother        ovarian/uterine  . Heart disease Mother   . Heart attack Mother   . Stroke Mother   . Colon cancer Father   . Dementia Father   . COPD Brother   . Congestive Heart Failure Brother   . Stroke Daughter   . Heart attack Daughter   . Suicidality Brother   . Breast cancer Neg Hx     Social History Social History   Tobacco Use  . Smoking status: Never Smoker  . Smokeless tobacco: Never Used  Substance Use Topics  . Alcohol use: Yes    Alcohol/week: 0.0 standard drinks    Comment: rarely  . Drug use: No     Allergies   Patient has no known allergies.   Review of Systems Review of Systems  Constitutional: Negative.   Skin: Positive for rash.   Physical Exam Triage Vital Signs ED Triage Vitals  Enc Vitals Group     BP 10/28/19 1719 (!) 136/54     Pulse Rate 10/28/19 1719 60     Resp 10/28/19 1719 18     Temp 10/28/19 1719 98.2 F (36.8 C)     Temp Source 10/28/19 1719 Oral     SpO2 10/28/19 1719 100 %     Weight 10/28/19 1719 144 lb (65.3 kg)     Height 10/28/19 1719 5\' 4"  (1.626 m)     Head Circumference --      Peak Flow --      Pain Score 10/28/19 1718 5     Pain Loc --      Pain Edu? --      Excl. in [place]? --    Updated Vital Signs BP (!) 136/54 (BP Location: Left Arm)   Pulse 60   Temp 98.2 F (36.8 C) (Oral)   Resp 18   Ht 5\' 4"  (1.626 m)   Wt 65.3 kg   SpO2 100%   BMI 24.72 kg/m   Visual Acuity Right Eye Distance:   Left Eye  Distance:   Bilateral Distance:    Right Eye Near:   Left Eye Near:    Bilateral Near:     Physical Exam Vitals and nursing note reviewed.  Constitutional:      General: She is not in acute distress.    Appearance: Normal appearance. She is not ill-appearing.  HENT:  Head: Normocephalic and atraumatic.  Eyes:     General:        Right eye: No discharge.        Left eye: No discharge.     Conjunctiva/sclera: Conjunctivae normal.  Cardiovascular:     Rate and Rhythm: Normal rate and regular rhythm.     Heart sounds: No murmur.  Pulmonary:     Effort: Pulmonary effort is normal.     Breath sounds: Normal breath sounds. No wheezing, rhonchi or rales.  Skin:    Comments: Buttock near gluteal cleft, posterior left thigh and lateral left foot - erythematous, vesicular rash.  Neurological:     Mental Status: She is alert.  Psychiatric:        Mood and Affect: Mood normal.        Behavior: Behavior normal.    UC Treatments / Results  Labs (all labs ordered are listed, but only abnormal results are displayed) Labs Reviewed - No data to display  EKG   Radiology No results found.  Procedures Procedures (including critical care time)  Medications Ordered in UC Medications - No data to display  Initial Impression / Assessment and Plan / UC Course  I have reviewed the triage vital signs and the nursing notes.  Pertinent labs & imaging results that were available during my care of the patient were reviewed by me and considered in my medical decision making (see chart for details).    74 year old female presents with herpes zoster. Treating with Valtrex.  Final Clinical Impressions(s) / UC Diagnoses   Final diagnoses:  Herpes zoster without complication   Discharge Instructions   None    ED Prescriptions    Medication Sig Dispense Auth. Provider   valACYclovir (VALTREX) 1000 MG tablet Take 1 tablet (1,000 mg total) by mouth 3 (three) times daily. 21 tablet  Thersa Salt G, DO     PDMP not reviewed this encounter.   Coral Spikes, [place] 10/28/19 2138

## 2019-10-28 NOTE — Telephone Encounter (Signed)
Patient is going to go to urgent care

## 2019-10-28 NOTE — Telephone Encounter (Signed)
Patient called in stating that she thinks she has shingles. Symptoms started a couple of days ago. She has a rash/.bumps on her tailbone right at the midline, left leg, and left foot. She says that she is achy and the her left leg and foot are burning. Husband has had shingles in the past and described it the same way. She is wondering what she can take for it. Patient also tested positive for COVID on Saturday. No acute symptoms noted except for the possible shingles. She is quarantined. I offered her a virtual visit but they do not have capability at home to do the video and no one can come over and help due to [place]. Did not know if you wanted to work her in and do a phone visit with her or if you preferred her be seen face to face at acute care. Advised I would be calling patient back

## 2019-11-03 ENCOUNTER — Other Ambulatory Visit: Payer: Self-pay | Admitting: Internal Medicine

## 2019-11-04 NOTE — Telephone Encounter (Signed)
Pt wanting to know if she was ok to go around people since having shingles.The places have dried up and medication will be completed tomorrow. Advised she should be ok but if she is going to be around anyone who is pregnant, they need to call their OB to determine when it is okay to be around them. Pt understood.

## 2019-11-04 NOTE — Telephone Encounter (Signed)
Pt called wanting to know at what period she's not contagious? Please advise and Thank you!  Call pt @ [phone].

## 2019-11-08 ENCOUNTER — Other Ambulatory Visit: Payer: Self-pay | Admitting: Physician Assistant

## 2019-11-11 ENCOUNTER — Ambulatory Visit: Payer: Medicare Other

## 2019-11-18 ENCOUNTER — Encounter (INDEPENDENT_AMBULATORY_CARE_PROVIDER_SITE_OTHER): Payer: Self-pay

## 2019-11-18 ENCOUNTER — Other Ambulatory Visit: Payer: Self-pay

## 2019-11-18 ENCOUNTER — Ambulatory Visit
Admission: RE | Admit: 2019-11-18 | Discharge: 2019-11-18 | Disposition: A | Discharge status: Home / Self Care | Payer: Medicare Other | Source: Ambulatory Visit | Attending: Internal Medicine | Admitting: Internal Medicine

## 2019-11-18 DIAGNOSIS — Z1231 Encounter for screening mammogram for malignant neoplasm of breast: Secondary | ICD-10-CM | POA: Diagnosis not present

## 2019-11-30 DIAGNOSIS — Z981 Arthrodesis status: Secondary | ICD-10-CM | POA: Insufficient documentation

## 2019-12-06 ENCOUNTER — Other Ambulatory Visit: Payer: Self-pay | Admitting: Student

## 2019-12-06 DIAGNOSIS — Z981 Arthrodesis status: Secondary | ICD-10-CM

## 2019-12-24 DIAGNOSIS — Z23 Encounter for immunization: Secondary | ICD-10-CM | POA: Diagnosis not present

## 2019-12-30 ENCOUNTER — Other Ambulatory Visit (INDEPENDENT_AMBULATORY_CARE_PROVIDER_SITE_OTHER): Payer: Self-pay | Admitting: Nurse Practitioner

## 2019-12-30 DIAGNOSIS — I6523 Occlusion and stenosis of bilateral carotid arteries: Secondary | ICD-10-CM

## 2019-12-31 ENCOUNTER — Encounter (INDEPENDENT_AMBULATORY_CARE_PROVIDER_SITE_OTHER): Payer: Self-pay | Admitting: Nurse Practitioner

## 2019-12-31 ENCOUNTER — Ambulatory Visit (INDEPENDENT_AMBULATORY_CARE_PROVIDER_SITE_OTHER): Payer: Medicare Other

## 2019-12-31 ENCOUNTER — Ambulatory Visit (INDEPENDENT_AMBULATORY_CARE_PROVIDER_SITE_OTHER): Payer: Medicare Other | Admitting: Nurse Practitioner

## 2019-12-31 ENCOUNTER — Other Ambulatory Visit: Payer: Self-pay

## 2019-12-31 VITALS — BP 110/65 | HR 64 | Resp 10 | Ht 64.0 in | Wt 144.0 lb

## 2019-12-31 DIAGNOSIS — I6523 Occlusion and stenosis of bilateral carotid arteries: Secondary | ICD-10-CM

## 2019-12-31 DIAGNOSIS — E785 Hyperlipidemia, unspecified: Secondary | ICD-10-CM | POA: Diagnosis not present

## 2019-12-31 DIAGNOSIS — I10 Essential (primary) hypertension: Secondary | ICD-10-CM | POA: Diagnosis not present

## 2019-12-31 NOTE — Progress Notes (Signed)
SUBJECTIVE:  Patient ID: Kristen Strickland, female    DOB: 01/17/1946, 74 y.o.   MRN: [id number] Chief Complaint  Patient presents with  . Follow-up    U/S Follow up    HPI  Kristen Strickland is a 74 y.o. female The patient is seen for follow up evaluation of carotid stenosis. The carotid stenosis followed by ultrasound.   The patient denies amaurosis fugax. There is no recent history of TIA symptoms or focal motor deficits. There is no prior documented CVA.  The patient is taking enteric-coated aspirin 81 mg daily.  There is no history of migraine headaches. There is no history of seizures.  The patient has a history of coronary artery disease, no recent episodes of angina or shortness of breath. The patient denies PAD or claudication symptoms. There is a history of hyperlipidemia which is being treated with a statin.     Past Medical History:  Diagnosis Date  . Arthritis   . Carotid arterial disease ([place])    a. 11/2017 Carotid U/S: RICA 123456, LICA 123456.   . Chronic fatigue   . Chronic leg pain   . DDD (degenerative disc disease), lumbar   . Dyspnea on exertion    a. 02/2017 Echo: EF 60-65%, no rwma, mild MR. Nl RV size/fxn. Nl PASP; b. 02/2017 MV: small, mild, fixed basal inferolateral defect - ? artifact vs scar. No ischemia. EF >65%.  . Hypercholesterolemia   . Hypertension   . Insomnia   . Palpitations    a. 02/2017 Holter: Avg HR 66 (51-115), rare PACs, four brief runs of PAT - up to 5 beats, max rate 157. Rare isolated PVCs. No sustained arrhythmias; b. 03/2018 24h Holter: Rare PACs, 3 beat run PAT (122 bpm), PVCs (2% of beats).  . Radiculopathy    Lumbar region  . Sinus bradycardia   . Spondylolisthesis    lumbosacral region    Past Surgical History:  Procedure Laterality Date  . ABDOMINAL EXPOSURE N/A 11/10/2018   Procedure: ABDOMINAL EXPOSURE;  Surgeon: Angelia Mould, MD;  Location: [place];  Service: Vascular;  Laterality: N/A;  . ABDOMINAL HYSTERECTOMY   1975  . ANTERIOR CERVICAL DECOMP/DISCECTOMY FUSION N/A 04/09/2013   Procedure: ANTERIOR CERVICAL DECOMPRESSION/DISCECTOMY FUSION 2 LEVELS;  Surgeon: Floyce Stakes, MD;  Location: [facility];  Service: Neurosurgery;  Laterality: N/A;  Cervical five-six Cervical six-seven  Anterior cervical decompression/diskectomy/fusion  . ANTERIOR LUMBAR FUSION N/A 11/10/2018   Procedure: Anterior Lumbar Interbody Fusion-Lumbar five-Sacral one;  Surgeon: Earnie Larsson, MD;  Location: [place];  Service: Neurosurgery;  Laterality: N/A;  . BACK SURGERY     ruptured disc  . RECONSTRUCTION OF NOSE      Social History   Socioeconomic History  . Marital status: Married    Spouse name: Not on file  . Number of children: 2  . Years of education: Not on file  . Highest education level: Not on file  Occupational History  . Not on file  Tobacco Use  . Smoking status: Never Smoker  . Smokeless tobacco: Never Used  Substance and Sexual Activity  . Alcohol use: Yes    Alcohol/week: 0.0 standard drinks    Comment: rarely  . Drug use: No  . Sexual activity: Yes  Other Topics Concern  . Not on file  Social History Narrative  . Not on file   Social Determinants of Health   Financial Resource Strain:   . Difficulty of Paying Living Expenses:  Food Insecurity:   . Worried About Charity fundraiser in the Last Year:   . Arboriculturist in the Last Year:   Transportation Needs:   . Film/video editor (Medical):   Marland Kitchen Lack of Transportation (Non-Medical):   Physical Activity:   . Days of Exercise per Week:   . Minutes of Exercise per Session:   Stress: No Stress Concern Present  . Feeling of Stress : Not at all  Social Connections:   . Frequency of Communication with Friends and Family:   . Frequency of Social Gatherings with Friends and Family:   . Attends Religious Services:   . Active Member of Clubs or Organizations:   . Attends Archivist Meetings:   Marland Kitchen Marital Status:   Intimate  Partner Violence:   . Fear of Current or Ex-Partner:   . Emotionally Abused:   Marland Kitchen Physically Abused:   . Sexually Abused:     Family History  Problem Relation Age of Onset  . Cancer Mother        ovarian/uterine  . Heart disease Mother   . Heart attack Mother   . Stroke Mother   . Colon cancer Father   . Dementia Father   . COPD Brother   . Congestive Heart Failure Brother   . Stroke Daughter   . Heart attack Daughter   . Suicidality Brother   . Breast cancer Neg Hx     No Known Allergies   Review of Systems   Review of Systems: Negative Unless Checked Constitutional: [] Weight loss  [] Fever  [] Chills Cardiac: [] Chest pain   []  Atrial Fibrillation  [] Palpitations   [] Shortness of breath when laying flat   [] Shortness of breath with exertion. [] Shortness of breath at rest Vascular:  [] Pain in legs with walking   [] Pain in legs with standing [] Pain in legs when laying flat   [] Claudication    [] Pain in feet when laying flat    [] History of DVT   [] Phlebitis   [] Swelling in legs   [] Varicose veins   [] Non-healing ulcers Pulmonary:   [] Uses home oxygen   [] Productive cough   [] Hemoptysis   [] Wheeze  [] COPD   [] Asthma Neurologic:  [] Dizziness   [] Seizures  [] Blackouts [] History of stroke   [] History of TIA  [] Aphasia   [] Temporary Blindness   [] Weakness or numbness in arm   [] Weakness or numbness in leg Musculoskeletal:   [] Joint swelling   [] Joint pain   [] Low back pain  []  History of Knee Replacement [x] Arthritis [] back Surgeries  []  Spinal Stenosis    Hematologic:  [] Easy bruising  [] Easy bleeding   [] Hypercoagulable state   [] Anemic Gastrointestinal:  [] Diarrhea   [] Vomiting  [] Gastroesophageal reflux/heartburn   [] Difficulty swallowing. [] Abdominal pain Genitourinary:  [] Chronic kidney disease   [] Difficult urination  [] Anuric   [] Blood in urine [] Frequent urination  [] Burning with urination   [] Hematuria Skin:  [] Rashes   [] Ulcers [] Wounds Psychological:  [] History of anxiety    []  History of major depression  []  Memory Difficulties      OBJECTIVE:   Physical Exam  BP 110/65   Pulse 64   Resp 10   Ht 5\' 4"  (1.626 m)   Wt 144 lb (65.3 kg)   BMI 24.72 kg/m   Gen: WD/WN, NAD Head: [place]/AT, No temporalis wasting.  Ear/Nose/Throat: Hearing grossly intact, nares w/o erythema or drainage Eyes: PER, EOMI, sclera nonicteric.  Neck: Supple, no masses.  No JVD.  Pulmonary:  Good  air movement, no use of accessory muscles.  Cardiac: RRR Vascular: no bruit auscultated  Vessel Right Left  Radial Palpable Palpable   Gastrointestinal: soft, non-distended. No guarding/no peritoneal signs.  Musculoskeletal: M/S 5/5 throughout.  No deformity or atrophy.  Neurologic: Pain and light touch intact in extremities.  Symmetrical.  Speech is fluent. Motor exam as listed above. Psychiatric: Judgment intact, Mood & affect appropriate for pt's clinical situation. Dermatologic: No Venous rashes. No Ulcers Noted.  No changes consistent with cellulitis. Lymph : No Cervical lymphadenopathy, no lichenification or skin changes of chronic lymphedema.       ASSESSMENT AND PLAN:  1. Bilateral carotid artery stenosis Recommend:  Given the patient's asymptomatic subcritical stenosis no further invasive testing or surgery at this time.   Carotid Duplex done today shows 1-39*% of RICA, 123456 of LICA.  Decrease in velocities in LICA since [id number]   Continue antiplatelet therapy as prescribed Continue management of CAD, HTN and Hyperlipidemia Healthy heart diet,  encouraged exercise at least 4 times per week Follow up in 6 months with duplex ultrasound and physical exam, due to previous velocities being in the 60-79% range.   2. Hyperlipidemia LDL goal <70 Continue statin as ordered and reviewed, no changes at this time   3. Essential hypertension Continue antihypertensive medications as already ordered, these medications have been reviewed and there are no changes at this  time.    Current Outpatient Medications on File Prior to Visit  Medication Sig Dispense Refill  . amLODipine (NORVASC) 5 MG tablet TAKE 1 TABLET BY MOUTH ONCE DAILY. 90 tablet 2  . aspirin EC 81 MG tablet Take 81 mg by mouth daily.    Marland Kitchen gabapentin (NEURONTIN) 100 MG capsule Take 100 mg by mouth 3 (three) times daily.    Marland Kitchen losartan-hydrochlorothiazide (HYZAAR) 100-25 MG tablet TAKE 1 TABLET BY MOUTH ONCE DAILY. 90 tablet 0  . Multiple Vitamins-Minerals (CENTRUM PO) Take 1 tablet by mouth daily.     Marland Kitchen omeprazole (PRILOSEC) 20 MG capsule TAKE (1) CAPSULE BY MOUTH TWICE DAILY FOR REFLUX 180 capsule 0  . valACYclovir (VALTREX) 1000 MG tablet Take 1 tablet (1,000 mg total) by mouth 3 (three) times daily. 21 tablet 0  . rosuvastatin (CRESTOR) 20 MG tablet Take 1 tablet (20 mg total) by mouth daily. 90 tablet 3   No current facility-administered medications on file prior to visit.    There are no Patient Instructions on file for this visit. No follow-ups on file.   Kris Hartmann, NP  This note was completed with Sales executive.  Any errors are purely unintentional.

## 2020-01-04 ENCOUNTER — Other Ambulatory Visit: Payer: Medicare Other

## 2020-01-06 ENCOUNTER — Ambulatory Visit
Admission: RE | Admit: 2020-01-06 | Discharge: 2020-01-06 | Disposition: A | Discharge status: Home / Self Care | Payer: Medicare Other | Source: Ambulatory Visit | Attending: Student | Admitting: Student

## 2020-01-06 ENCOUNTER — Other Ambulatory Visit: Payer: Self-pay

## 2020-01-06 DIAGNOSIS — M5126 Other intervertebral disc displacement, lumbar region: Secondary | ICD-10-CM | POA: Diagnosis not present

## 2020-01-06 DIAGNOSIS — Z981 Arthrodesis status: Secondary | ICD-10-CM

## 2020-01-06 MED ORDER — GADOBENATE DIMEGLUMINE 529 MG/ML IV SOLN
13.0000 mL | Freq: Once | INTRAVENOUS | Status: AC | PRN
  Administered 2020-01-06: 13 mL via INTRAVENOUS

## 2020-01-28 DIAGNOSIS — Z23 Encounter for immunization: Secondary | ICD-10-CM | POA: Diagnosis not present

## 2020-02-02 DIAGNOSIS — M4317 Spondylolisthesis, lumbosacral region: Secondary | ICD-10-CM | POA: Insufficient documentation

## 2020-02-07 ENCOUNTER — Telehealth: Payer: Self-pay | Admitting: Internal Medicine

## 2020-02-07 ENCOUNTER — Other Ambulatory Visit: Payer: Self-pay

## 2020-02-07 MED ORDER — LOSARTAN POTASSIUM-HCTZ 100-25 MG PO TABS: 1.0000 | ORAL_TABLET | Freq: Every day | ORAL | 0 refills | Status: DC

## 2020-02-07 NOTE — Telephone Encounter (Signed)
Patient called requesting a refill on her losartan-hydrochlorothiazide (HYZAAR) 100-25 MG tablet , I informed the patient that she needed a visit with Dr. Nicki Reaper . She has been scheduled for 4.27.2021 @ 9:30. She only has two pills left.

## 2020-02-07 NOTE — Telephone Encounter (Signed)
Medication sent in. 

## 2020-02-15 ENCOUNTER — Telehealth (INDEPENDENT_AMBULATORY_CARE_PROVIDER_SITE_OTHER): Payer: Medicare Other | Admitting: Internal Medicine

## 2020-02-15 DIAGNOSIS — E785 Hyperlipidemia, unspecified: Secondary | ICD-10-CM | POA: Diagnosis not present

## 2020-02-15 DIAGNOSIS — I10 Essential (primary) hypertension: Secondary | ICD-10-CM

## 2020-02-15 DIAGNOSIS — F439 Reaction to severe stress, unspecified: Secondary | ICD-10-CM

## 2020-02-15 DIAGNOSIS — M25511 Pain in right shoulder: Secondary | ICD-10-CM | POA: Diagnosis not present

## 2020-02-15 DIAGNOSIS — R739 Hyperglycemia, unspecified: Secondary | ICD-10-CM | POA: Diagnosis not present

## 2020-02-15 MED ORDER — OMEPRAZOLE 20 MG PO CPDR: 20.0000 mg | DELAYED_RELEASE_CAPSULE | Freq: Every day | ORAL | 1 refills | Status: DC

## 2020-02-15 NOTE — Progress Notes (Signed)
Patient ID: Kristen Strickland, female   DOB: 1946/08/18, 74 y.o.   MRN: [id number]   Virtual Visit via telephone Note  This visit type was conducted due to national recommendations for restrictions regarding the COVID-19 pandemic (e.g. social distancing).  This format is felt to be most appropriate for this patient at this time.  All issues noted in this document were discussed and addressed.  No physical exam was performed (except for noted visual exam findings with Video Visits).   I connected with Herschell Dimes by telephone and verified that I am speaking with the correct person using two identifiers. Location patient: home Location provider: work Persons participating in the telephone visit: patient, provider  The limitations, risks, security and privacy concerns of performing an evaluation and management service by telephone and the availability of in person appointments have been discussed.  The patient expressed understanding and agreed to proceed.   Reason for visit: scheduled follow up.    HPI: Had a f/u appt with [facility] 02/02/20 - s/p L5-S1 anterior lumbar interbody fusion.  Stable.  Recommended continuing neurontin.  Is helping.  Also prescribed skelaxin.  Had f/u with vascular surgery 12/31/19- recommended continuing antiplatelet therapy.  Recommended f/u in 6 months.  Stays active.  No chest pain or sob.  Some right shoulder pain.  Lifts her one year old grandson.  S/p injection.  Desires no further w/up at this time.  No abdominal pain or bowel change reported.  Overall handling stress.     ROS: See pertinent positives and negatives per HPI.  Past Medical History:  Diagnosis Date  . Arthritis   . Carotid arterial disease ([place])    a. 11/2017 Carotid U/S: RICA 7-37%, LICA 10-62%.   . Chronic fatigue   . Chronic leg pain   . DDD (degenerative disc disease), lumbar   . Dyspnea on exertion    a. 02/2017 Echo: EF 60-65%, no rwma, mild MR. Nl RV size/fxn. Nl PASP; b. 02/2017 MV: small,  mild, fixed basal inferolateral defect - ? artifact vs scar. No ischemia. EF >65%.  . Hypercholesterolemia   . Hypertension   . Insomnia   . Palpitations    a. 02/2017 Holter: Avg HR 66 (51-115), rare PACs, four brief runs of PAT - up to 5 beats, max rate 157. Rare isolated PVCs. No sustained arrhythmias; b. 03/2018 24h Holter: Rare PACs, 3 beat run PAT (122 bpm), PVCs (2% of beats).  . Radiculopathy    Lumbar region  . Sinus bradycardia   . Spondylolisthesis    lumbosacral region    Past Surgical History:  Procedure Laterality Date  . ABDOMINAL EXPOSURE N/A 11/10/2018   Procedure: ABDOMINAL EXPOSURE;  Surgeon: Angelia Mould, MD;  Location: [place];  Service: Vascular;  Laterality: N/A;  . ABDOMINAL HYSTERECTOMY  1975  . ANTERIOR CERVICAL DECOMP/DISCECTOMY FUSION N/A 04/09/2013   Procedure: ANTERIOR CERVICAL DECOMPRESSION/DISCECTOMY FUSION 2 LEVELS;  Surgeon: Floyce Stakes, MD;  Location: [facility];  Service: Neurosurgery;  Laterality: N/A;  Cervical five-six Cervical six-seven  Anterior cervical decompression/diskectomy/fusion  . ANTERIOR LUMBAR FUSION N/A 11/10/2018   Procedure: Anterior Lumbar Interbody Fusion-Lumbar five-Sacral one;  Surgeon: Earnie Larsson, MD;  Location: [place];  Service: Neurosurgery;  Laterality: N/A;  . BACK SURGERY     ruptured disc  . RECONSTRUCTION OF NOSE      Family History  Problem Relation Age of Onset  . Cancer Mother        ovarian/uterine  . Heart disease  Mother   . Heart attack Mother   . Stroke Mother   . Colon cancer Father   . Dementia Father   . COPD Brother   . Congestive Heart Failure Brother   . Stroke Daughter   . Heart attack Daughter   . Suicidality Brother   . Breast cancer Neg Hx     SOCIAL HX: reviewed.    Current Outpatient Medications:  .  amLODipine (NORVASC) 5 MG tablet, TAKE 1 TABLET BY MOUTH ONCE DAILY., Disp: 90 tablet, Rfl: 2 .  aspirin EC 81 MG tablet, Take 81 mg by mouth daily., Disp: , Rfl:  .   gabapentin (NEURONTIN) 100 MG capsule, Take 100 mg by mouth 3 (three) times daily., Disp: , Rfl:  .  losartan-hydrochlorothiazide (HYZAAR) 100-25 MG tablet, Take 1 tablet by mouth daily., Disp: 30 tablet, Rfl: 0 .  metaxalone (SKELAXIN) 800 MG tablet, , Disp: , Rfl:  .  Multiple Vitamins-Minerals (CENTRUM PO), Take 1 tablet by mouth daily. , Disp: , Rfl:  .  omeprazole (PRILOSEC) 20 MG capsule, Take 1 capsule (20 mg total) by mouth daily., Disp: 90 capsule, Rfl: 1 .  rosuvastatin (CRESTOR) 20 MG tablet, Take 1 tablet (20 mg total) by mouth daily., Disp: 90 tablet, Rfl: 3 .  valACYclovir (VALTREX) 1000 MG tablet, Take 1 tablet (1,000 mg total) by mouth 3 (three) times daily., Disp: 21 tablet, Rfl: 0  EXAM:  GENERAL: alert.  Sounds to be in no acute distress.  Answering questions appropriately.    PSYCH/NEURO: pleasant and cooperative, no obvious depression or anxiety, speech and thought processing grossly intact  ASSESSMENT AND PLAN:  Discussed the following assessment and plan:  Stress Increased stress. Discussed.  Does not feel needs any further intervention at this time.  Follow.    Right shoulder pain Has been evaluated.  S/p injection.  Desires no further intervention.  Follow.    Hypertension Blood pressure has been under good control.  Continue losartan/hctz and amlodipine.  Follow pressures.  Follow metabolic panel.   Hyperlipidemia LDL goal <70 On crestor.  Low cholesterol diet and exercise.  Follow lipid panel and liver function tests.    Hyperglycemia Low carb diet and exercise.  Follow met b and a1c.     Orders Placed This Encounter  Procedures  . Hemoglobin A1c    Standing Status:   Future    Standing Expiration Date:   02/19/2021  . Hepatic function panel    Standing Status:   Future    Standing Expiration Date:   02/19/2021  . Lipid panel    Standing Status:   Future    Standing Expiration Date:   02/19/2021  . Basic metabolic panel    Standing Status:   Future      Standing Expiration Date:   02/19/2021    Meds ordered this encounter  Medications  . omeprazole (PRILOSEC) 20 MG capsule    Sig: Take 1 capsule (20 mg total) by mouth daily.    Dispense:  90 capsule    Refill:  1    For future fills. Hold until patient requests.     I discussed the assessment and treatment plan with the patient. The patient was provided an opportunity to ask questions and all were answered. The patient agreed with the plan and demonstrated an understanding of the instructions.   The patient was advised to call back or seek an in-person evaluation if the symptoms worsen or if the condition fails to improve  as anticipated.  I provided 25 minutes of non-face-to-face time during this encounter.   Einar Pheasant, MD

## 2020-02-20 ENCOUNTER — Encounter: Payer: Self-pay | Admitting: Internal Medicine

## 2020-02-20 NOTE — Assessment & Plan Note (Signed)
Blood pressure has been under good control.  Continue losartan/hctz and amlodipine.  Follow pressures.  Follow metabolic panel.

## 2020-02-20 NOTE — Assessment & Plan Note (Signed)
Low carb diet and exercise.  Follow met b and a1c.   

## 2020-02-20 NOTE — Assessment & Plan Note (Signed)
Increased stress.  Discussed.  Does not feel needs any further intervention at this time.  Follow.  

## 2020-02-20 NOTE — Assessment & Plan Note (Signed)
On crestor.  Low cholesterol diet and exercise.  Follow lipid panel and liver function tests.   

## 2020-02-20 NOTE — Assessment & Plan Note (Signed)
Has been evaluated.  S/p injection.  Desires no further intervention.  Follow.

## 2020-02-29 ENCOUNTER — Other Ambulatory Visit: Payer: Medicare Other

## 2020-03-08 ENCOUNTER — Other Ambulatory Visit (INDEPENDENT_AMBULATORY_CARE_PROVIDER_SITE_OTHER): Payer: Medicare Other

## 2020-03-08 ENCOUNTER — Telehealth: Payer: Self-pay | Admitting: Internal Medicine

## 2020-03-08 ENCOUNTER — Other Ambulatory Visit: Payer: Self-pay

## 2020-03-08 DIAGNOSIS — I10 Essential (primary) hypertension: Secondary | ICD-10-CM

## 2020-03-08 DIAGNOSIS — R739 Hyperglycemia, unspecified: Secondary | ICD-10-CM | POA: Diagnosis not present

## 2020-03-08 DIAGNOSIS — E785 Hyperlipidemia, unspecified: Secondary | ICD-10-CM

## 2020-03-08 LAB — BASIC METABOLIC PANEL
BUN: 18 mg/dL (ref 6–23)
CO2: 31 mEq/L (ref 19–32)
Calcium: 9.7 mg/dL (ref 8.4–10.5)
Chloride: 103 mEq/L (ref 96–112)
Creatinine, Ser: 0.75 mg/dL (ref 0.40–1.20)
GFR: 75.77 mL/min (ref >60.00)
Glucose, Bld: 101 mg/dL — ABNORMAL HIGH (ref 70–99)
Potassium: 3.9 mEq/L (ref 3.5–5.1)
Sodium: 140 mEq/L (ref 135–145)

## 2020-03-08 LAB — HEPATIC FUNCTION PANEL
ALT: 16 U/L (ref 0–35)
AST: 15 U/L (ref 0–37)
Albumin: 4.3 g/dL (ref 3.5–5.2)
Alkaline Phosphatase: 71 U/L (ref 39–117)
Bilirubin, Direct: 0.1 mg/dL (ref 0.0–0.3)
Total Bilirubin: 0.6 mg/dL (ref 0.2–1.2)
Total Protein: 6.8 g/dL (ref 6.0–8.3)

## 2020-03-08 LAB — LIPID PANEL
Cholesterol: 152 mg/dL (ref 0–200)
HDL: 49.7 mg/dL (ref >39.00)
LDL Cholesterol: 87 mg/dL (ref 0–99)
NonHDL: 102.08
Total CHOL/HDL Ratio: 3
Triglycerides: 74 mg/dL (ref 0.0–149.0)
VLDL: 14.8 mg/dL (ref 0.0–40.0)

## 2020-03-08 LAB — HEMOGLOBIN A1C: Hgb A1c MFr Bld: 6 % (ref 4.6–6.5)

## 2020-03-08 NOTE — Telephone Encounter (Signed)
Appears the labs Dr End requested were drawn at PCP. Attempted to reach patient but no answer after several rings and no VM.  Routing to Dr End to review the LIPID and LIVER panels.

## 2020-03-08 NOTE — Telephone Encounter (Signed)
Attempted to reach patient. No answer or VM after several rings, will try again at another time.

## 2020-03-08 NOTE — Telephone Encounter (Signed)
Please let Ms. Grevious know that I have reviewed her labs.  Her cholesterol has improved considerably and is just above goal.  I recommend that she continue her current medications and keep working on lifestyle modifications.  Nelva Bush, MD [facility]

## 2020-03-08 NOTE — Telephone Encounter (Signed)
Patient calling  States Dr End wanted her to have lab work and she just had lab work completed at her PCP office  Would like to make sure it included the same information Dr End was wanting Please call if additional lab work needed

## 2020-03-09 ENCOUNTER — Other Ambulatory Visit: Payer: Self-pay | Admitting: Internal Medicine

## 2020-03-09 NOTE — Telephone Encounter (Signed)
Patient notified and verbalized understanding of Dr End's recommendations and plan of care.  

## 2020-03-09 NOTE — Telephone Encounter (Signed)
Attempted to reach patient. No answer or VM after several rings, will try again at another time.

## 2020-03-13 ENCOUNTER — Other Ambulatory Visit: Payer: Self-pay | Admitting: Internal Medicine

## 2020-03-13 MED ORDER — LOSARTAN POTASSIUM-HCTZ 100-25 MG PO TABS: 1.0000 | ORAL_TABLET | Freq: Every day | ORAL | 0 refills | Status: DC

## 2020-03-13 NOTE — Telephone Encounter (Signed)
Sorry, the medication is losartan-hydrochlorothiazide (HYZAAR) 100-25 MG tablet.

## 2020-03-13 NOTE — Telephone Encounter (Signed)
Patient states that she went to pick up medication today and her pharmacy did not receive the prescription.

## 2020-03-13 NOTE — Telephone Encounter (Signed)
I am confused.  The original message is from one month ago.  At that time she only had two tablets left.   Needs a f/u appt.  Does she have any medication now.

## 2020-03-13 NOTE — Telephone Encounter (Signed)
For some reason I can not resend the medication. The note is saying that the refill is placed in a future note. Please advise.

## 2020-03-13 NOTE — Addendum Note (Signed)
Addended by: Elpidio Galea T on: 03/13/2020 05:01 PM   Modules accepted: Orders

## 2020-03-23 IMAGING — US VENOUS DOPPLER ULTRASOUND OF LEFT LOWER EXTREMITY
1 series · 13 of 24 positions shown · non-contrast
Comparison: None.

CLINICAL DATA: Left lower extremity pain and edema for several
months



[Series 1: venous doppler ultrasound of left lower extremity · 13 of 34 slices shown]
[im 1/34]
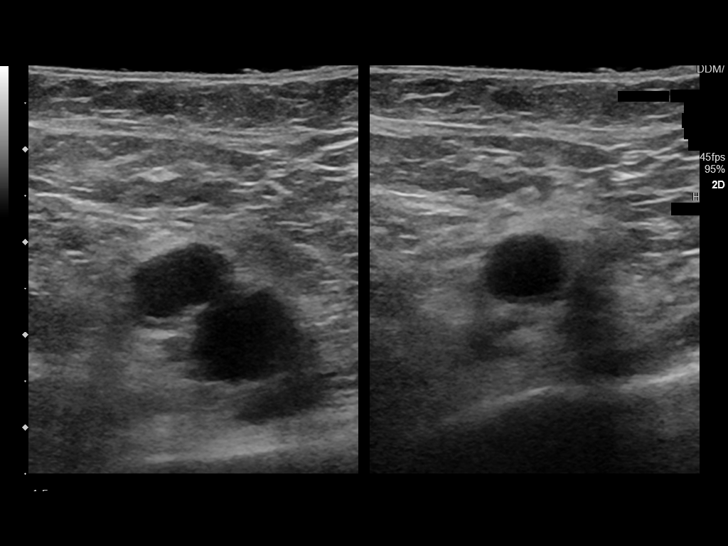
[im 3/34]
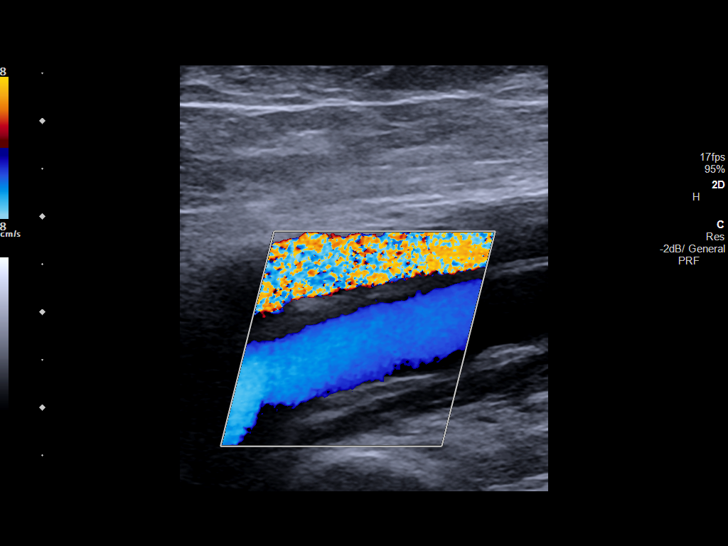
[im 6/34]
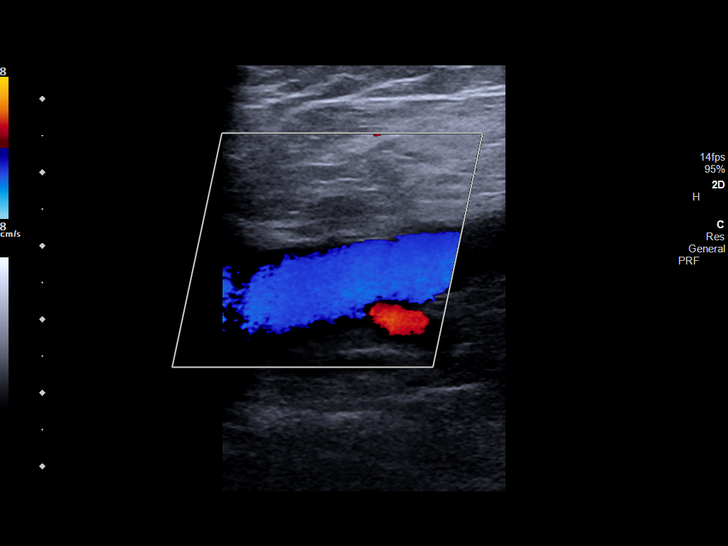
[im 9/34]
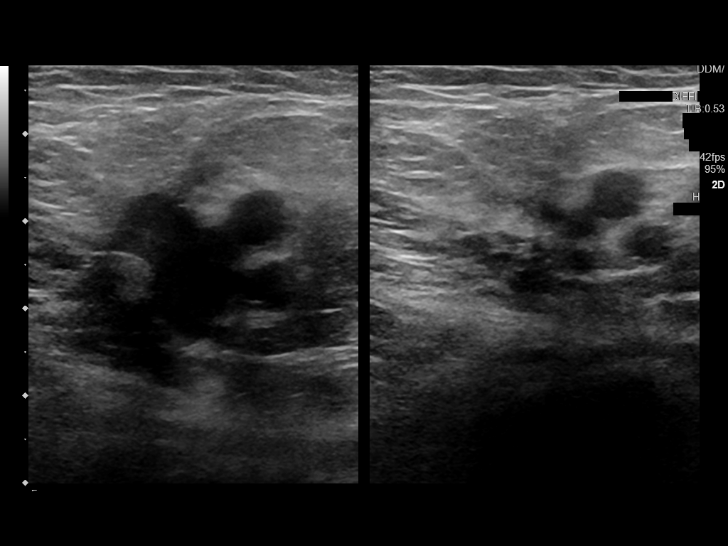
[im 12/34]
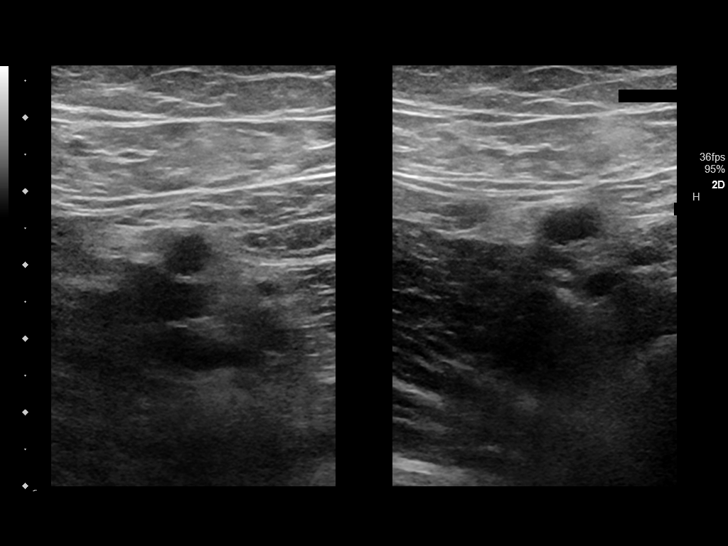
[im 15/34]
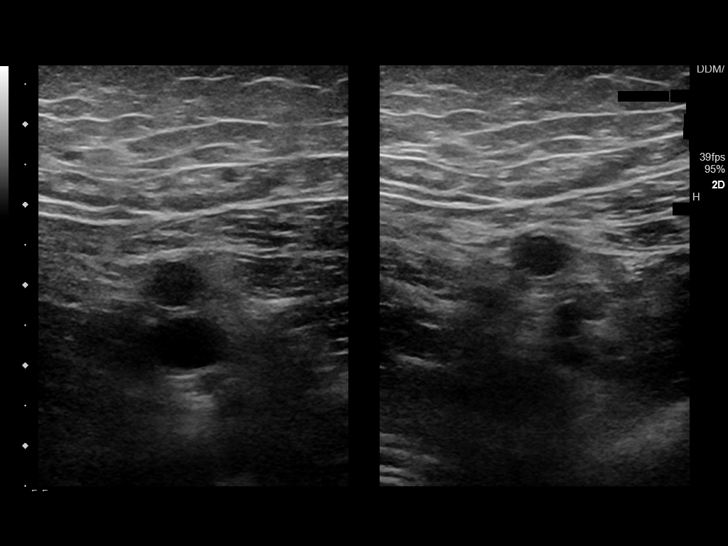
[im 18/34]
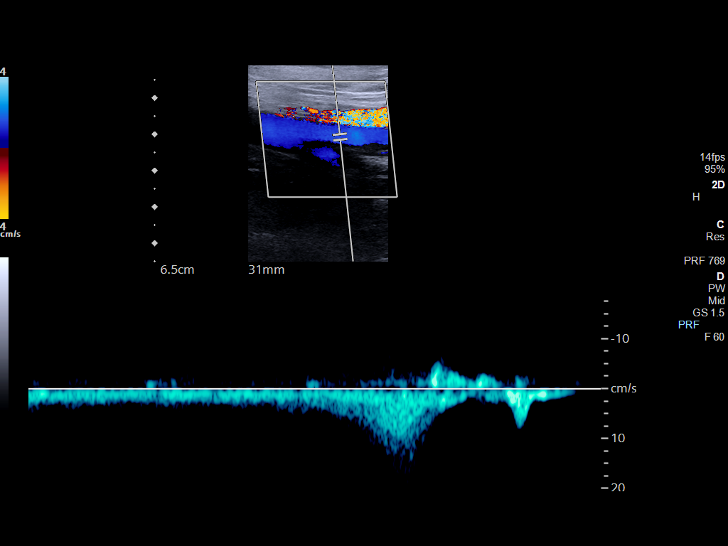
[im 19/34]
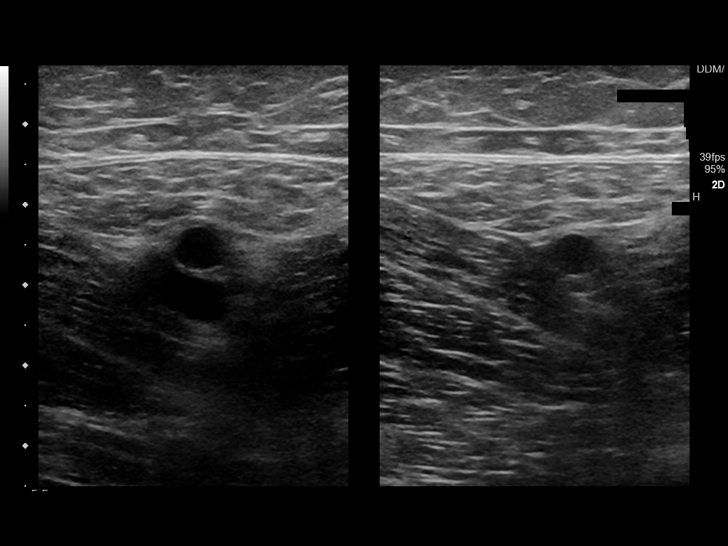
[im 22/34]
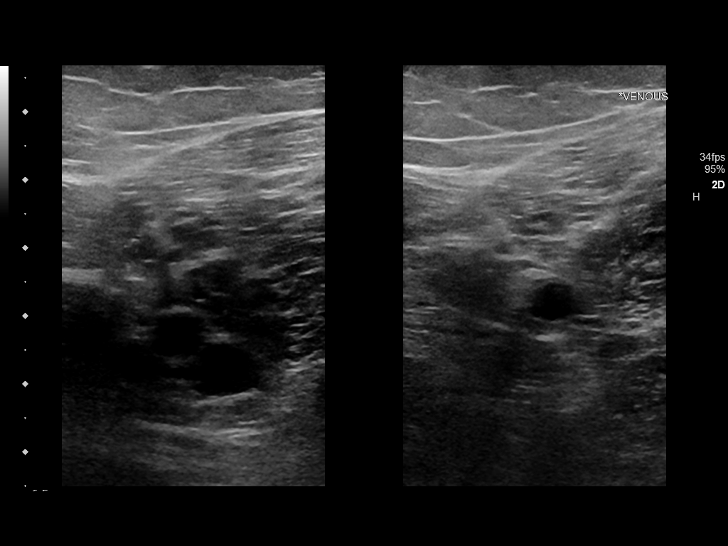
[im 25/34]
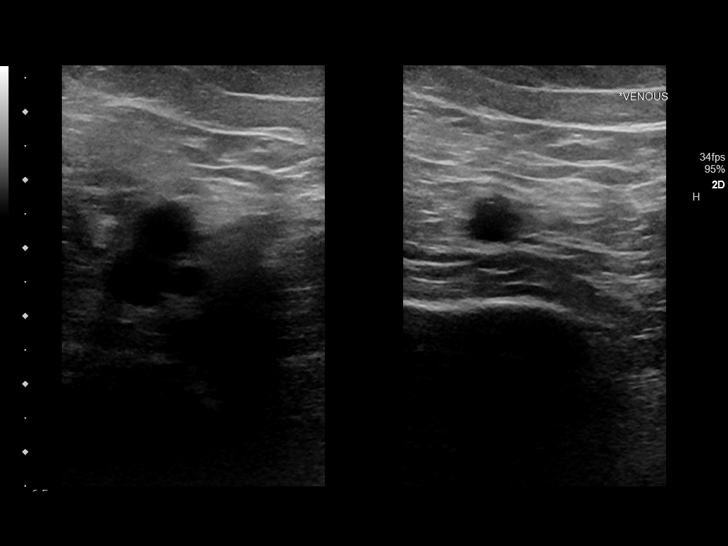
[im 28/34]
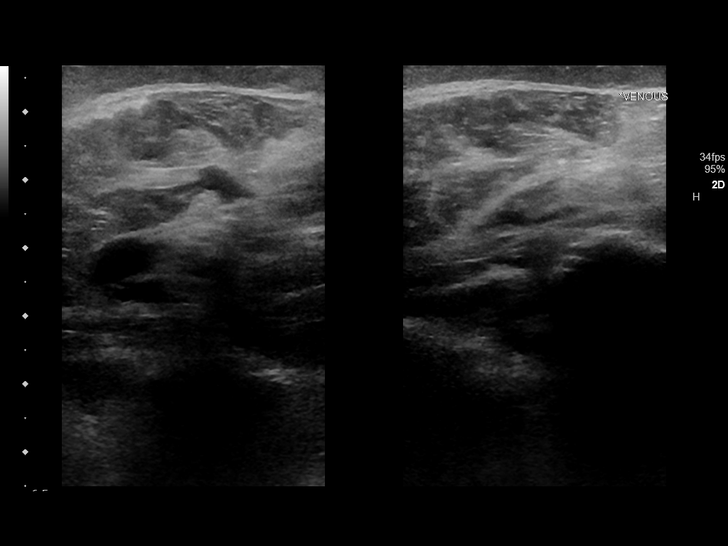
[im 31/34]
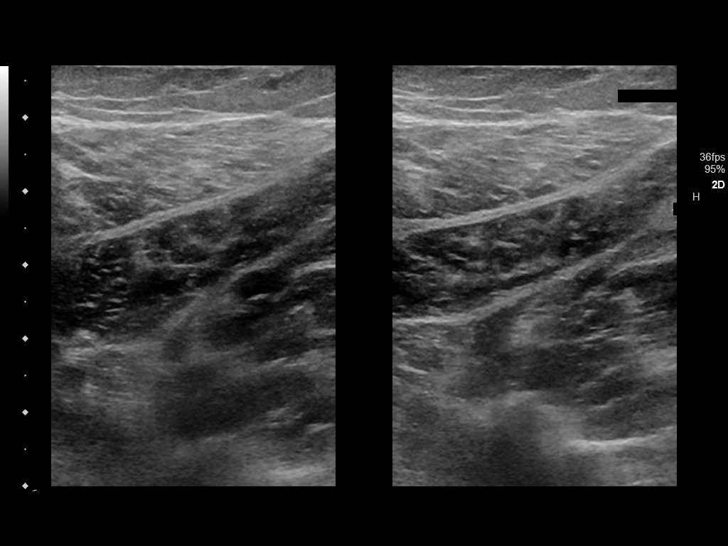
[im 34/34]
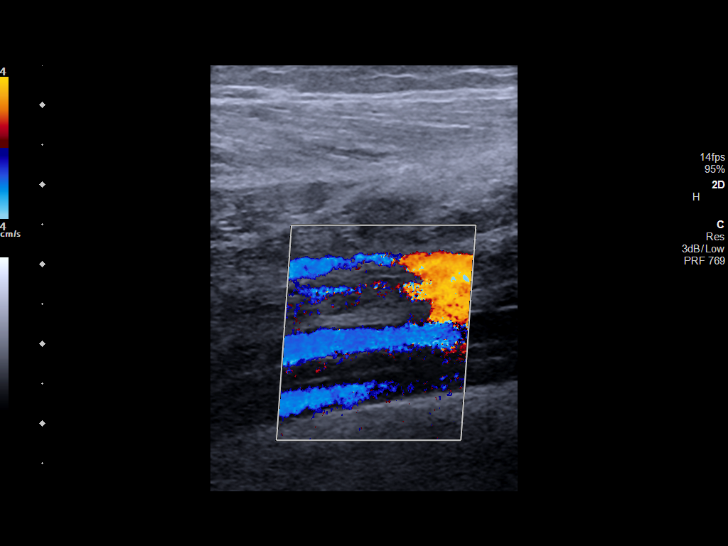

[13 of 24 positions shown; findings below may reference images not displayed]

FINDINGS: Contralateral Common Femoral Vein: Respiratory phasicity is normal
and symmetric with the symptomatic side. No evidence of thrombus.
Normal compressibility.

Common Femoral Vein: No evidence of thrombus. Normal
compressibility, respiratory phasicity and response to augmentation.

Saphenofemoral Junction: No evidence of thrombus. Normal
compressibility and flow on color Doppler imaging.

Profunda Femoral Vein: No evidence of thrombus. Normal
compressibility and flow on color Doppler imaging.

Femoral Vein: No evidence of thrombus. Normal compressibility,
respiratory phasicity and response to augmentation.

Popliteal Vein: No evidence of thrombus. Normal compressibility,
respiratory phasicity and response to augmentation.

Calf Veins: No evidence of thrombus. Normal compressibility and flow
on color Doppler imaging.
IMPRESSION: No evidence of deep venous thrombosis.

## 2020-03-23 IMAGING — DX LEFT FOOT - 2 VIEW
2 series · 2 of 2 positions shown · non-contrast
Comparison: None.

CLINICAL DATA: Left foot pain for several weeks

EXAM:
LEFT FOOT - 2 VIEW

[foot ap]
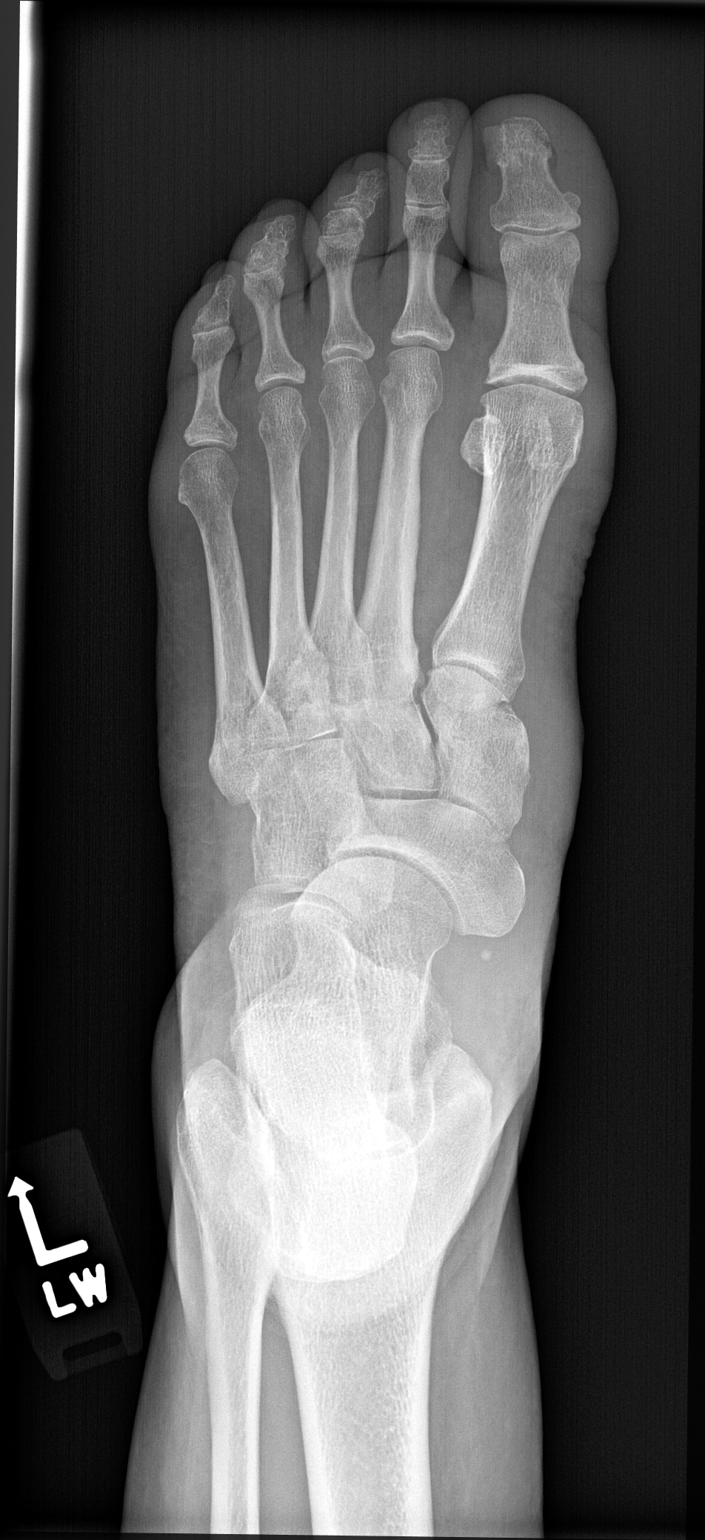

[foot lat]
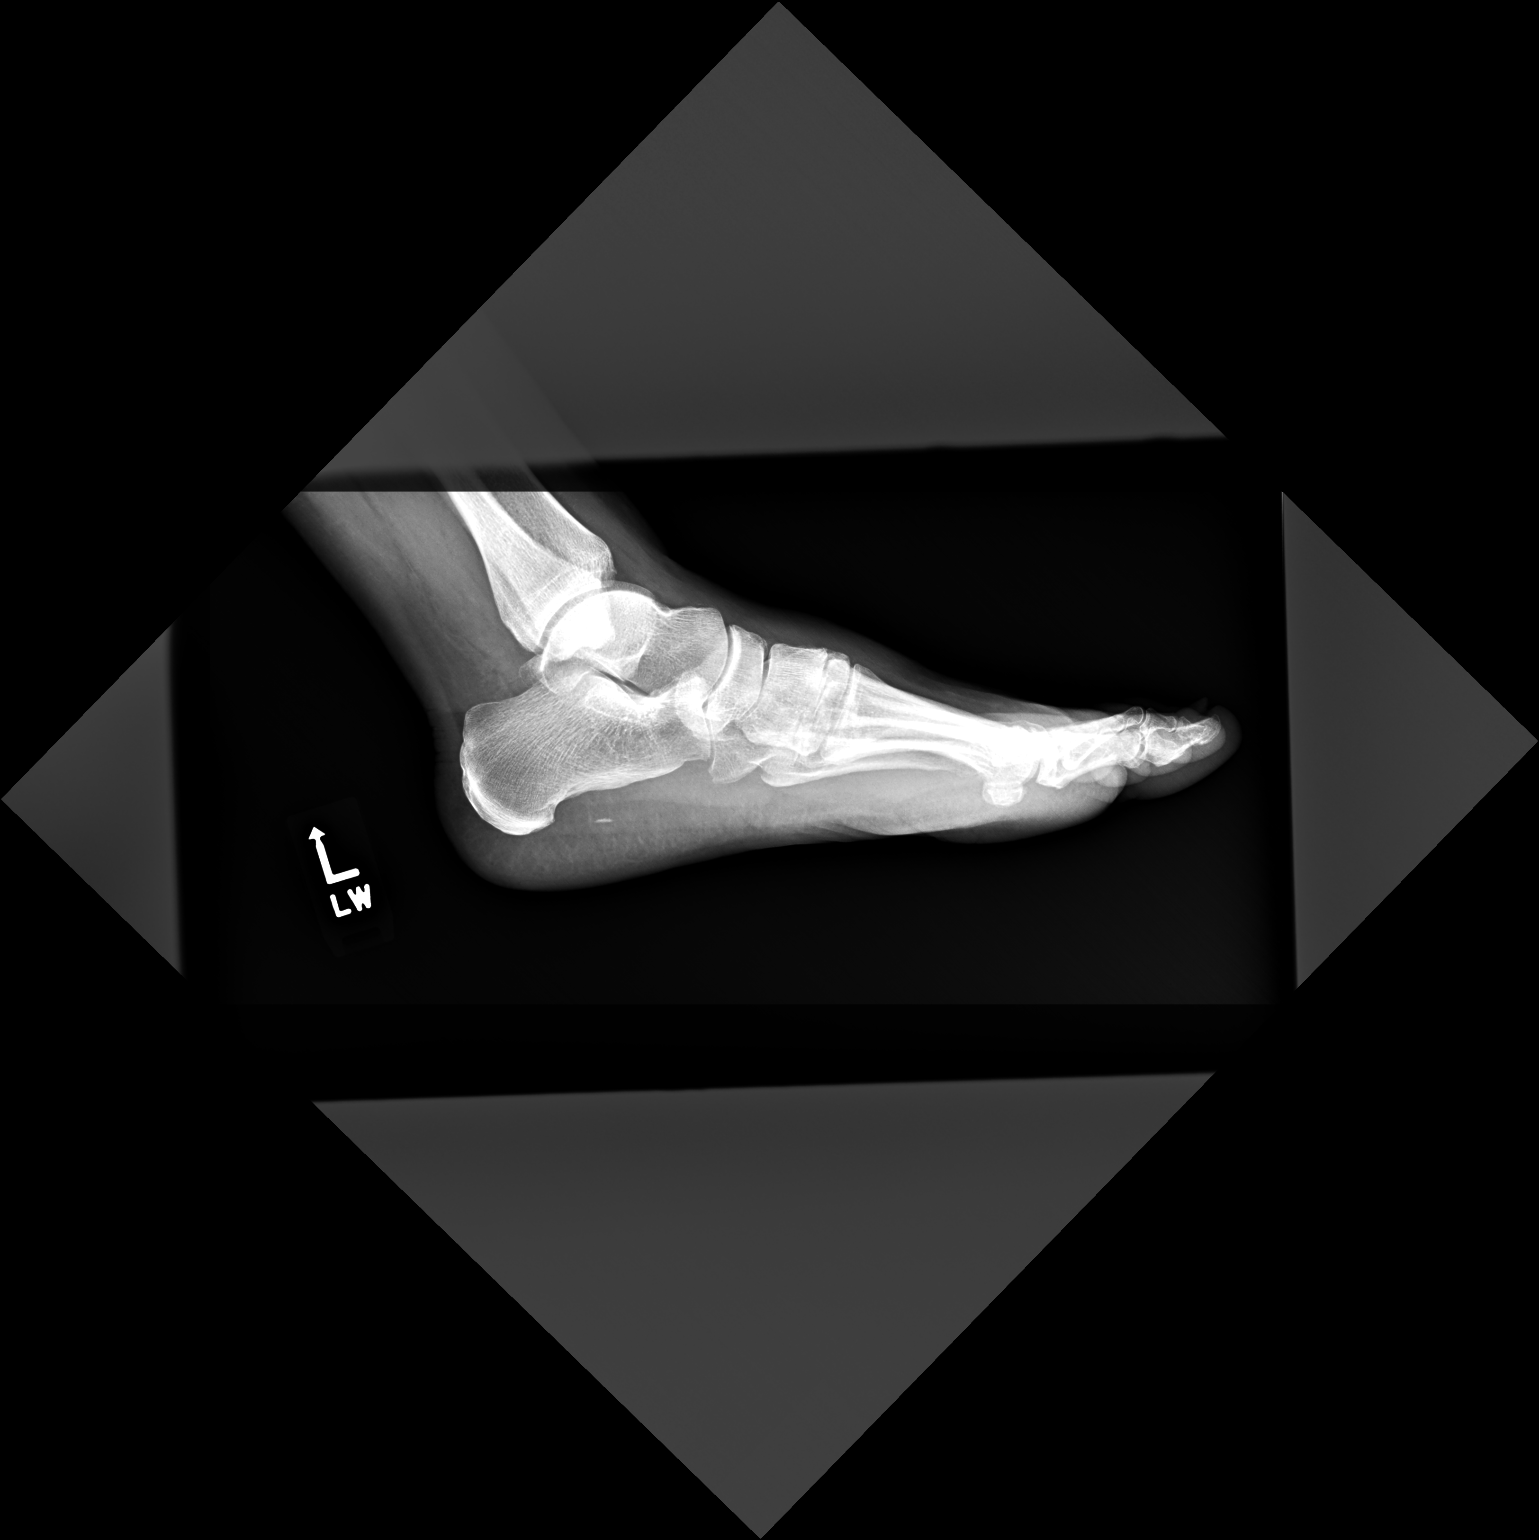

[2 of 2 positions shown; findings below may reference images not displayed]

FINDINGS: No fracture or malalignment. Tiny plantar soft tissue calcification.
Mild degenerative change at the first MTP joint
IMPRESSION: No acute osseous abnormality

## 2020-03-27 ENCOUNTER — Ambulatory Visit: Payer: Medicare Other

## 2020-04-03 ENCOUNTER — Ambulatory Visit (INDEPENDENT_AMBULATORY_CARE_PROVIDER_SITE_OTHER): Payer: Medicare Other

## 2020-04-03 VITALS — Ht 64.0 in | Wt 144.0 lb

## 2020-04-03 DIAGNOSIS — Z0000 Encounter for general adult medical examination without abnormal findings: Secondary | ICD-10-CM

## 2020-04-03 NOTE — Progress Notes (Signed)
Subjective:   Kristen Strickland is a 74 y.o. female who presents for Medicare Annual (Subsequent) preventive examination.  Review of Systems:  No ROS.  Medicare Wellness Virtual Visit.    Cardiac Risk Factors include: advanced age (>96men, >37 women);hypertension     Objective:     Vitals: Ht 5\' 4"  (1.626 m)   Wt 144 lb (65.3 kg)   BMI 24.72 kg/m   Body mass index is 24.72 kg/m.  Advanced Directives 04/03/2020 10/13/2019 09/14/2019 03/25/2019 10/28/2018 03/20/2018 03/19/2017  Does Patient Have a Medical Advance Directive? No No No No No No No  Would patient like information on creating a medical advance directive? No - Patient declined - - No - Patient declined No - Patient declined No - Patient declined No - Patient declined  Pre-existing out of facility DNR order (yellow form or pink MOST form) - - - - - - -    Tobacco Social History   Tobacco Use  Smoking Status Never Smoker  Smokeless Tobacco Never Used     Counseling given: Not Answered   Clinical Intake:  Pre-visit preparation completed: Yes        Diabetes: No  How often do you need to have someone help you when you read instructions, pamphlets, or other written materials from your doctor or pharmacy?: 1 - Never  Interpreter Needed?: No     Past Medical History:  Diagnosis Date  . Arthritis   . Carotid arterial disease ([place])    a. 11/2017 Carotid U/S: RICA 2-67%, LICA 12-45%.   . Chronic fatigue   . Chronic leg pain   . DDD (degenerative disc disease), lumbar   . Dyspnea on exertion    a. 02/2017 Echo: EF 60-65%, no rwma, mild MR. Nl RV size/fxn. Nl PASP; b. 02/2017 MV: small, mild, fixed basal inferolateral defect - ? artifact vs scar. No ischemia. EF >65%.  . Hypercholesterolemia   . Hypertension   . Insomnia   . Palpitations    a. 02/2017 Holter: Avg HR 66 (51-115), rare PACs, four brief runs of PAT - up to 5 beats, max rate 157. Rare isolated PVCs. No sustained arrhythmias; b. 03/2018 24h Holter: Rare  PACs, 3 beat run PAT (122 bpm), PVCs (2% of beats).  . Radiculopathy    Lumbar region  . Sinus bradycardia   . Spondylolisthesis    lumbosacral region   Past Surgical History:  Procedure Laterality Date  . ABDOMINAL EXPOSURE N/A 11/10/2018   Procedure: ABDOMINAL EXPOSURE;  Surgeon: Angelia Mould, MD;  Location: [facility];  Service: Vascular;  Laterality: N/A;  . ABDOMINAL HYSTERECTOMY  1975  . ANTERIOR CERVICAL DECOMP/DISCECTOMY FUSION N/A 04/09/2013   Procedure: ANTERIOR CERVICAL DECOMPRESSION/DISCECTOMY FUSION 2 LEVELS;  Surgeon: Floyce Stakes, MD;  Location: [facility];  Service: Neurosurgery;  Laterality: N/A;  Cervical five-six Cervical six-seven  Anterior cervical decompression/diskectomy/fusion  . ANTERIOR LUMBAR FUSION N/A 11/10/2018   Procedure: Anterior Lumbar Interbody Fusion-Lumbar five-Sacral one;  Surgeon: Earnie Larsson, MD;  Location: [facility];  Service: Neurosurgery;  Laterality: N/A;  . BACK SURGERY     ruptured disc  . RECONSTRUCTION OF NOSE     Family History  Problem Relation Age of Onset  . Cancer Mother        ovarian/uterine  . Heart disease Mother   . Heart attack Mother   . Stroke Mother   . Colon cancer Father   . Dementia Father   . COPD Brother   .  Congestive Heart Failure Brother   . Stroke Daughter   . Heart attack Daughter   . Suicidality Brother   . Breast cancer Neg Hx    Social History   Socioeconomic History  . Marital status: Married    Spouse name: Not on file  . Number of children: 2  . Years of education: Not on file  . Highest education level: Not on file  Occupational History  . Not on file  Tobacco Use  . Smoking status: Never Smoker  . Smokeless tobacco: Never Used  Vaping Use  . Vaping Use: Never used  Substance and Sexual Activity  . Alcohol use: Yes    Alcohol/week: 0.0 standard drinks    Comment: rarely  . Drug use: No  . Sexual activity: Yes  Other Topics Concern  . Not on file  Social History Narrative  .  Not on file   Social Determinants of Health   Financial Resource Strain:   . Difficulty of Paying Living Expenses:   Food Insecurity:   . Worried About Charity fundraiser in the Last Year:   . Arboriculturist in the Last Year:   Transportation Needs:   . Film/video editor (Medical):   Marland Kitchen Lack of Transportation (Non-Medical):   Physical Activity:   . Days of Exercise per Week:   . Minutes of Exercise per Session:   Stress:   . Feeling of Stress :   Social Connections:   . Frequency of Communication with Friends and Family:   . Frequency of Social Gatherings with Friends and Family:   . Attends Religious Services:   . Active Member of Clubs or Organizations:   . Attends Archivist Meetings:   Marland Kitchen Marital Status:     Outpatient Encounter Medications as of 04/03/2020  Medication Sig  . amLODipine (NORVASC) 5 MG tablet TAKE 1 TABLET BY MOUTH ONCE DAILY.  Marland Kitchen aspirin EC 81 MG tablet Take 81 mg by mouth daily.  Marland Kitchen gabapentin (NEURONTIN) 100 MG capsule Take 100 mg by mouth 3 (three) times daily.  Marland Kitchen losartan-hydrochlorothiazide (HYZAAR) 100-25 MG tablet Take 1 tablet by mouth daily.  . metaxalone (SKELAXIN) 800 MG tablet   . Multiple Vitamins-Minerals (CENTRUM PO) Take 1 tablet by mouth daily.   Marland Kitchen omeprazole (PRILOSEC) 20 MG capsule Take 1 capsule (20 mg total) by mouth daily.  . rosuvastatin (CRESTOR) 20 MG tablet Take 1 tablet (20 mg total) by mouth daily.  . valACYclovir (VALTREX) 1000 MG tablet Take 1 tablet (1,000 mg total) by mouth 3 (three) times daily.   No facility-administered encounter medications on file as of 04/03/2020.    Activities of Daily Living In your present state of health, do you have any difficulty performing the following activities: 04/03/2020  Hearing? N  Vision? N  Difficulty concentrating or making decisions? N  Walking or climbing stairs? N  Dressing or bathing? N  Doing errands, shopping? N  Preparing Food and eating ? N  Using the  Toilet? N  In the past six months, have you accidently leaked urine? Y  Do you have problems with loss of bowel control? N  Managing your Medications? N  Managing your Finances? N  Housekeeping or managing your Housekeeping? N  Some recent data might be hidden    Patient Care Team: Einar Pheasant, MD as PCP - General (Internal Medicine) End, Harrell Gave, MD as PCP - Cardiology (Cardiology) Earnie Larsson, MD as Consulting Physician (Neurosurgery)    Assessment:  This is a routine wellness examination for Kristen Strickland.  I connected with Kristen Strickland today by telephone and verified that I am speaking with the correct person using two identifiers. Location patient: home Location provider: work Persons participating in the virtual visit: patient, Marine scientist.    I discussed the limitations, risks, security and privacy concerns of performing an evaluation and management service by telephone and the availability of in person appointments. The patient expressed understanding and verbally consented to this telephonic visit.    Interactive audio and video telecommunications were attempted between this provider and patient, however failed, due to patient having technical difficulties OR patient did not have access to video capability.  We continued and completed visit with audio only.  Some vital signs may be absent or patient reported.   Health Maintenance Due: -Tdap vaccine- discussed; to be completed with doctor in visit or local pharmacy.   See completed HM at the end of note.   Eye: Visual acuity not assessed. Virtual visit. Followed by their ophthalmologist.  Dental: Visits every 6 months.    Hearing: Demonstrates normal hearing during visit.  Safety:  Patient feels safe at home- yes Patient does have smoke detectors at home- yes Patient does wear sunscreen or protective clothing when in direct sunlight - yes Patient does wear seat belt when in a moving vehicle - yes Patient drives-  yes Adequate lighting in walkways free from debris- yes Grab bars and handrails used as appropriate- yes Ambulates with an assistive device- no Cell phone on person when ambulating outside of the home- yes  Social: Alcohol intake - yes      Smoking history- never  Smokers in home? none Illicit drug use? none  Medication: Taking as directed and without issues.  Self managed - yes  Covid-19: Precautions and sickness symptoms discussed. Wears mask, social distancing, hand hygiene as appropriate.   Activities of Daily Living Patient denies needing assistance with: household chores, feeding themselves, getting from bed to chair, getting to the toilet, bathing/showering, dressing, managing money, or preparing meals.   Discussed the importance of a healthy diet, water intake and the benefits of aerobic exercise.  Physical activity- active outside the home mowing the lawn, helps manage a greenhouse, and keeps her grandchildren. Encouraged to stay active.   Diet:  Regular Water: good intake  Caffeine: 2-5 cups of coffee daily  Other Providers Patient Care Team: Einar Pheasant, MD as PCP - General (Internal Medicine) End, Harrell Gave, MD as PCP - Cardiology (Cardiology) Earnie Larsson, MD as Consulting Physician (Neurosurgery) Exercise Activities and Dietary recommendations Current Exercise Habits: The patient does not participate in regular exercise at present  Goals      Patient Stated   .  Follow up with Primary Care Provider (pt-stated)      As needed       Fall Risk Fall Risk  04/03/2020 03/25/2019 03/20/2018 03/19/2017 03/12/2016  Falls in the past year? 0 0 Yes No Yes  Number falls in past yr: 0 - 1 - 1  Injury with Fall? 0 - No - No  Comment - - She missed a step off of a ladder but did not get hurt.  - -  Risk for fall due to : - - - - -  Risk for fall due to: Comment - - - - -  Follow up Falls evaluation completed - - - Education provided;Falls prevention discussed    Is the patient's home free of loose throw rugs in walkways, pet beds, electrical  cords, etc?         Handrails on the stairs?  Yes      Adequate lighting? Yes  Timed Get Up and Go performed: No, virtual visit  Depression Screen PHQ 2/9 Scores 04/03/2020 03/25/2019 03/20/2018 03/19/2017  PHQ - 2 Score 0 1 0 0  PHQ- 9 Score - - - 0    Cognitive Function  MMSE - Mini Mental State Exam 03/20/2018 03/19/2017 03/12/2016  Orientation to time 5 5 5   Orientation to Place 5 5 5   Registration 3 3 3   Attention/ Calculation 4 3 5   Attention/Calculation-comments - difficulty with simple calculation -  Recall 1 1 2   Language- name 2 objects 2 2 2   Language- repeat 1 1 1   Language- follow 3 step command 3 3 3   Language- read & follow direction 1 1 1   Write a sentence 1 1 1   Copy design 1 1 1   Total score 27 26 29      6CIT Screen 04/03/2020 03/25/2019  What Year? 0 points 0 points  What month? 0 points 0 points  What time? - 0 points  Count back from 20 0 points 0 points  Months in reverse 0 points 0 points  Repeat phrase 2 points -    Immunization History  Administered Date(s) Administered  . Moderna SARS-COVID-2 Vaccination 12/24/2019, 01/28/2020   Screening Tests Health Maintenance  Topic Date Due  . TETANUS/TDAP  Never done  . COLONOSCOPY  03/04/2024  . DEXA SCAN  Completed  . COVID-19 Vaccine  Completed  . Hepatitis C Screening  Completed  . INFLUENZA VACCINE  Discontinued  . MAMMOGRAM  Discontinued  . PNA vac Low Risk Adult  Discontinued      Plan:   Keep all routine maintenance appointments.   Follow up 05/24/20 @ 10:30  Medicare Attestation I have personally reviewed: The patient's medical and social history Their use of alcohol, tobacco or illicit drugs Their current medications and supplements The patient's functional ability including ADLs,fall risks, home safety risks, cognitive, and hearing and visual impairment Diet and physical activities Evidence for  depression   I have reviewed and discussed with patient certain preventive protocols, quality metrics, and best practice recommendations.      Varney Biles, LPN  [id number]

## 2020-04-03 NOTE — Patient Instructions (Addendum)
  Kristen Strickland , Thank you for taking time to come for your Medicare Wellness Visit. I appreciate your ongoing commitment to your health goals. Please review the following plan we discussed and let me know if I can assist you in the future.   These are the goals we discussed: Goals      Patient Stated   .  Follow up with Primary Care Provider (pt-stated)      As needed       This is a list of the screening recommended for you and due dates:  Health Maintenance  Topic Date Due  . Tetanus Vaccine  Never done  . Colon Cancer Screening  03/04/2024  . DEXA scan (bone density measurement)  Completed  . COVID-19 Vaccine  Completed  .  Hepatitis C: One time screening is recommended by Center for Disease Control  (CDC) for  adults born from [birth year] through 1964.   Completed  . Flu Shot  Discontinued  . Mammogram  Discontinued  . Pneumonia vaccines  Discontinued

## 2020-04-04 NOTE — Progress Notes (Signed)
I have reviewed the above note and agree.  Salem Lembke, M.D.  

## 2020-04-30 ENCOUNTER — Ambulatory Visit
Admission: RE | Admit: 2020-04-30 | Discharge: 2020-04-30 | Disposition: A | Discharge status: Home / Self Care | Payer: Medicare Other | Source: Ambulatory Visit | Attending: Emergency Medicine | Admitting: Emergency Medicine

## 2020-04-30 ENCOUNTER — Other Ambulatory Visit: Payer: Self-pay

## 2020-04-30 VITALS — BP 125/68 | HR 65 | Temp 98.1°F | Resp 18 | Ht 64.0 in | Wt 137.4 lb

## 2020-04-30 DIAGNOSIS — Z20822 Contact with and (suspected) exposure to covid-19: Secondary | ICD-10-CM | POA: Diagnosis not present

## 2020-04-30 DIAGNOSIS — J069 Acute upper respiratory infection, unspecified: Secondary | ICD-10-CM | POA: Diagnosis not present

## 2020-04-30 NOTE — ED Provider Notes (Signed)
[facility] - [place], [place]   Name: Kristen Strickland DOB: July 31, 1946 MRN: [id number] CSN: [id number] PCP: Einar Pheasant, MD  Arrival date and time:  04/30/20 1143  Chief Complaint:  Appointment, Chills, Generalized Body Aches, Cough, and Headache   NOTE: Prior to seeing the patient today, I have reviewed the triage nursing documentation and vital signs. Clinical staff has updated patient's PMH/PSHx, current medication list, and drug allergies/intolerances to ensure comprehensive history available to assist in medical decision making.   History:   HPI: Kristen Strickland is a 74 y.o. female who presents today with complaints of chills, cough, body aches and headaches for 3 days.  Patient first noticed her symptoms Thursday evening.  Her symptoms started with body aches, headaches and cough.  She attempted to treat her symptoms with over-the-counter Coricidin and Allegra.  She has noted some mild symptom relief until she started to feel chills yesterday.  Unfortunately, she did not check her temperature at that time and with chills self resolved without any intervention.  She denies any known fevers, shortness of breath, or pain with breathing.  Patient is fully vaccinated against COVID-19.  She received her final Moderna vaccine in April 2021.  She denies any known contact with anyone with possible COVID-19.  She abides to all COVID-19 precautions in public.  Past Medical History:  Diagnosis Date  . Arthritis   . Carotid arterial disease ([place])    a. 11/2017 Carotid U/S: RICA 3-61%, LICA 44-31%.   . Chronic fatigue   . Chronic leg pain   . DDD (degenerative disc disease), lumbar   . Dyspnea on exertion    a. 02/2017 Echo: EF 60-65%, no rwma, mild MR. Nl RV size/fxn. Nl PASP; b. 02/2017 MV: small, mild, fixed basal inferolateral defect - ? artifact vs scar. No ischemia. EF >65%.  . Hypercholesterolemia   . Hypertension   . Insomnia   . Palpitations    a. 02/2017 Holter: Avg HR 66  (51-115), rare PACs, four brief runs of PAT - up to 5 beats, max rate 157. Rare isolated PVCs. No sustained arrhythmias; b. 03/2018 24h Holter: Rare PACs, 3 beat run PAT (122 bpm), PVCs (2% of beats).  . Radiculopathy    Lumbar region  . Sinus bradycardia   . Spondylolisthesis    lumbosacral region    Past Surgical History:  Procedure Laterality Date  . ABDOMINAL EXPOSURE N/A 11/10/2018   Procedure: ABDOMINAL EXPOSURE;  Surgeon: Angelia Mould, MD;  Location: [facility];  Service: Vascular;  Laterality: N/A;  . ABDOMINAL HYSTERECTOMY  1975  . ANTERIOR CERVICAL DECOMP/DISCECTOMY FUSION N/A 04/09/2013   Procedure: ANTERIOR CERVICAL DECOMPRESSION/DISCECTOMY FUSION 2 LEVELS;  Surgeon: Floyce Stakes, MD;  Location: [facility];  Service: Neurosurgery;  Laterality: N/A;  Cervical five-six Cervical six-seven  Anterior cervical decompression/diskectomy/fusion  . ANTERIOR LUMBAR FUSION N/A 11/10/2018   Procedure: Anterior Lumbar Interbody Fusion-Lumbar five-Sacral one;  Surgeon: Earnie Larsson, MD;  Location: [facility];  Service: Neurosurgery;  Laterality: N/A;  . BACK SURGERY     ruptured disc  . RECONSTRUCTION OF NOSE      Family History  Problem Relation Age of Onset  . Cancer Mother        ovarian/uterine  . Heart disease Mother   . Heart attack Mother   . Stroke Mother   . Colon cancer Father   . Dementia Father   . COPD Brother   . Congestive Heart Failure Brother   .  Stroke Daughter   . Heart attack Daughter   . Suicidality Brother   . Breast cancer Neg Hx     Social History   Tobacco Use  . Smoking status: Never Smoker  . Smokeless tobacco: Never Used  Vaping Use  . Vaping Use: Never used  Substance Use Topics  . Alcohol use: Yes    Alcohol/week: 0.0 standard drinks    Comment: rarely  . Drug use: No    Patient Active Problem List   Diagnosis Date Noted  . Bilateral leg pain 08/13/2019  . Sleeping difficulties 06/29/2019  . Left foot pain 05/30/2019  .  Hyperglycemia 05/30/2019  . PAC (premature atrial contraction) 01/27/2019  . PVC (premature ventricular contraction) 01/27/2019  . Leg swelling 01/27/2019  . Bilateral carotid artery stenosis 01/27/2019  . Degenerative spondylolisthesis 11/10/2018  . Decreased sense of taste 10/12/2018  . Impingement syndrome, shoulder, right 04/03/2018  . Swelling of right hand 11/29/2017  . Fatigue 11/29/2017  . Palpitations 06/11/2017  . Dyspnea on exertion 09/30/2016  . Carotid stenosis 09/17/2015  . Back pain 08/13/2015  . Health care maintenance 02/08/2015  . Stress 07/03/2014  . Arthritis, degenerative 05/03/2014  . Right knee pain 03/27/2014  . Menopausal symptoms 02/27/2014  . Dizziness 01/08/2014  . Right shoulder pain 04/26/2013  . Hypertension 08/23/2012  . Hyperlipidemia LDL goal <70 08/23/2012    Home Medications:    Current Meds  Medication Sig  . amLODipine (NORVASC) 5 MG tablet TAKE 1 TABLET BY MOUTH ONCE DAILY.  Marland Kitchen aspirin EC 81 MG tablet Take 81 mg by mouth daily.  Marland Kitchen gabapentin (NEURONTIN) 100 MG capsule Take 100 mg by mouth 3 (three) times daily.  Marland Kitchen losartan-hydrochlorothiazide (HYZAAR) 100-25 MG tablet Take 1 tablet by mouth daily.  . Multiple Vitamins-Minerals (CENTRUM PO) Take 1 tablet by mouth daily.   Marland Kitchen omeprazole (PRILOSEC) 20 MG capsule Take 1 capsule (20 mg total) by mouth daily.  . rosuvastatin (CRESTOR) 20 MG tablet Take 1 tablet (20 mg total) by mouth daily.    Allergies:   Patient has no known allergies.  Review of Systems (ROS): Review of Systems  Constitutional: Positive for chills. Negative for appetite change and fatigue.  HENT: Positive for sneezing. Negative for ear discharge, ear pain and postnasal drip.   Respiratory: Positive for cough. Negative for choking and shortness of breath.   Gastrointestinal: Negative for diarrhea, nausea and rectal pain.     Vital Signs: Today's Vitals   04/30/20 1152 04/30/20 1153 04/30/20 1226  BP: 125/68      Pulse: 65    Resp: 18    Temp: 98.1 F (36.7 C)    TempSrc: Oral    SpO2: 98%    Weight:  137 lb 6.4 oz (62.3 kg)   Height:  5\' 4"  (1.626 m)   PainSc:  0-No pain 0-No pain    Physical Exam: Physical Exam Vitals and nursing note reviewed.  Constitutional:      General: She is not in acute distress.    Appearance: She is well-developed.  HENT:     Head: Normocephalic and atraumatic.  Eyes:     Conjunctiva/sclera: Conjunctivae normal.  Cardiovascular:     Rate and Rhythm: Normal rate and regular rhythm.     Heart sounds: No murmur heard.   Pulmonary:     Effort: Pulmonary effort is normal. No respiratory distress.     Breath sounds: Normal breath sounds.  Abdominal:     Palpations: Abdomen is soft.  Tenderness: There is no abdominal tenderness.  Musculoskeletal:     Cervical back: Neck supple.  Skin:    General: Skin is warm and dry.  Neurological:     Mental Status: She is alert.      Urgent Care Treatments / Results:   LABS: PLEASE NOTE: all labs that were ordered this encounter are listed, however only abnormal results are displayed. Labs Reviewed  SARS CORONAVIRUS 2 (TAT 6-24 HRS)    EKG: -None  RADIOLOGY: No results found.  PROCEDURES: Procedures  MEDICATIONS RECEIVED THIS VISIT: Medications - No data to display  PERTINENT CLINICAL COURSE NOTES/UPDATES:   Initial Impression / Assessment and Plan / Urgent Care Course:  Pertinent labs & imaging results that were available during my care of the patient were personally reviewed by me and considered in my medical decision making (see lab/imaging section of note for values and interpretations).  Kristen Strickland is a 74 y.o. female who presents to [facility] today with complaints of cough, diagnosed with viral upper respiratory infection, and treated as such with the directions below. NP and patient reviewed discharge instructions below during visit.   Patient is well appearing overall in  clinic today. She does not appear to be in any acute distress. Presenting symptoms (see HPI) and exam as documented above.   I have reviewed the follow up and strict return precautions for any new or worsening symptoms. Patient is aware of symptoms that would be deemed urgent/emergent, and would thus require further evaluation either here or in the emergency department. At the time of discharge, she verbalized understanding and consent with the discharge plan as it was reviewed with her. All questions were fielded by provider and/or clinic staff prior to patient discharge.    Final Clinical Impressions / Urgent Care Diagnoses:   Final diagnoses:  Viral URI with cough  Encounter for screening laboratory testing for COVID-19 virus    New Prescriptions:  [place] Controlled Substance Registry consulted? Not Applicable  No orders of the defined types were placed in this encounter.     Discharge Instructions     You were seen for cough and Covid test and are being treated for viral upper respiratory infection.   Use over-the-counter dextromethorphan (Dimetapp) to control your cough and guaifenesin (Mucinex) to loosen mucus.  Make sure you stay away from decongestions which can interfere with your blood pressure.  Add Flonase nasal spray to help with your nasal congestion and pressure.  Take care, Dr. Marland Kitchen, NP-c -----    Person Under Monitoring Name: Kristen Strickland  Location: [place] [id number]   Infection Prevention Recommendations for Individuals Confirmed to have, or Being Evaluated for, 2019 Novel Coronavirus (COVID-19) Infection Who Receive Care at Home  Individuals who are confirmed to have, or are being evaluated for, COVID-19 should follow the prevention steps below until a healthcare provider or local or state health department says they can return to normal activities.  Stay home except to get medical care You should restrict activities  outside your home, except for getting medical care. Do not go to work, school, or public areas, and do not use public transportation or taxis.  Call ahead before visiting your doctor Before your medical appointment, call the healthcare provider and tell them that you have, or are being evaluated for, COVID-19 infection. This will help the healthcare provider's office take steps to keep other people from getting infected. Ask your healthcare provider to call the  local or state health department.  Monitor your symptoms Seek prompt medical attention if your illness is worsening (e.g., difficulty breathing). Before going to your medical appointment, call the healthcare provider and tell them that you have, or are being evaluated for, COVID-19 infection. Ask your healthcare provider to call the local or state health department.  Wear a facemask You should wear a facemask that covers your nose and mouth when you are in the same room with other people and when you visit a healthcare provider. People who live with or visit you should also wear a facemask while they are in the same room with you.  Separate yourself from other people in your home As much as possible, you should stay in a different room from other people in your home. Also, you should use a separate bathroom, if available.  Avoid sharing household items You should not share dishes, drinking glasses, cups, eating utensils, towels, bedding, or other items with other people in your home. After using these items, you should wash them thoroughly with soap and water.  Cover your coughs and sneezes Cover your mouth and nose with a tissue when you cough or sneeze, or you can cough or sneeze into your sleeve. Throw used tissues in a lined trash can, and immediately wash your hands with soap and water for at least 20 seconds or use an alcohol-based hand rub.  Wash your [facility] your hands often and thoroughly with soap and water for at  least 20 seconds. You can use an alcohol-based hand sanitizer if soap and water are not available and if your hands are not visibly dirty. Avoid touching your eyes, nose, and mouth with unwashed hands.   Prevention Steps for Caregivers and Household Members of Individuals Confirmed to have, or Being Evaluated for, COVID-19 Infection Being Cared for in the Home  If you live with, or provide care at home for, a person confirmed to have, or being evaluated for, COVID-19 infection please follow these guidelines to prevent infection:  Follow healthcare provider's instructions Make sure that you understand and can help the patient follow any healthcare provider instructions for all care.  Provide for the patient's basic needs You should help the patient with basic needs in the home and provide support for getting groceries, prescriptions, and other personal needs.  Monitor the patient's symptoms If they are getting sicker, call his or her medical provider and tell them that the patient has, or is being evaluated for, COVID-19 infection. This will help the healthcare provider's office take steps to keep other people from getting infected. Ask the healthcare provider to call the local or state health department.  Limit the number of people who have contact with the patient  If possible, have only one caregiver for the patient.  Other household members should stay in another home or place of residence. If this is not possible, they should stay  in another room, or be separated from the patient as much as possible. Use a separate bathroom, if available.  Restrict visitors who do not have an essential need to be in the home.  Keep older adults, very young children, and other sick people away from the patient Keep older adults, very young children, and those who have compromised immune systems or chronic health conditions away from the patient. This includes people with chronic heart, lung, or  kidney conditions, diabetes, and cancer.  Ensure good ventilation Make sure that shared spaces in the home have good air flow,  such as from an air conditioner or an opened window, weather permitting.  Wash your hands often  Wash your hands often and thoroughly with soap and water for at least 20 seconds. You can use an alcohol based hand sanitizer if soap and water are not available and if your hands are not visibly dirty.  Avoid touching your eyes, nose, and mouth with unwashed hands.  Use disposable paper towels to dry your hands. If not available, use dedicated cloth towels and replace them when they become wet.  Wear a facemask and gloves  Wear a disposable facemask at all times in the room and gloves when you touch or have contact with the patient's blood, body fluids, and/or secretions or excretions, such as sweat, saliva, sputum, nasal mucus, vomit, urine, or feces.  Ensure the mask fits over your nose and mouth tightly, and do not touch it during use.  Throw out disposable facemasks and gloves after using them. Do not reuse.  Wash your hands immediately after removing your facemask and gloves.  If your personal clothing becomes contaminated, carefully remove clothing and launder. Wash your hands after handling contaminated clothing.  Place all used disposable facemasks, gloves, and other waste in a lined container before disposing them with other household waste.  Remove gloves and wash your hands immediately after handling these items.  Do not share dishes, glasses, or other household items with the patient  Avoid sharing household items. You should not share dishes, drinking glasses, cups, eating utensils, towels, bedding, or other items with a patient who is confirmed to have, or being evaluated for, COVID-19 infection.  After the person uses these items, you should wash them thoroughly with soap and water.  Wash laundry thoroughly  Immediately remove and wash clothes  or bedding that have blood, body fluids, and/or secretions or excretions, such as sweat, saliva, sputum, nasal mucus, vomit, urine, or feces, on them.  Wear gloves when handling laundry from the patient.  Read and follow directions on labels of laundry or clothing items and detergent. In general, wash and dry with the warmest temperatures recommended on the label.  Clean all areas the individual has used often  Clean all touchable surfaces, such as counters, tabletops, doorknobs, bathroom fixtures, toilets, phones, keyboards, tablets, and bedside tables, every day. Also, clean any surfaces that may have blood, body fluids, and/or secretions or excretions on them.  Wear gloves when cleaning surfaces the patient has come in contact with.  Use a diluted bleach solution (e.g., dilute bleach with 1 part bleach and 10 parts water) or a household disinfectant with a label that says EPA-registered for coronaviruses. To make a bleach solution at home, add 1 tablespoon of bleach to 1 quart (4 cups) of water. For a larger supply, add  cup of bleach to 1 gallon (16 cups) of water.  Read labels of cleaning products and follow recommendations provided on product labels. Labels contain instructions for safe and effective use of the cleaning product including precautions you should take when applying the product, such as wearing gloves or eye protection and making sure you have good ventilation during use of the product.  Remove gloves and wash hands immediately after cleaning.  Monitor yourself for signs and symptoms of illness Caregivers and household members are considered close contacts, should monitor their health, and will be asked to limit movement outside of the home to the extent possible. Follow the monitoring steps for close contacts listed on the symptom monitoring form.   ?  If you have additional questions, contact your local health department or call the epidemiologist on call at  [phone] (available 24/7). ? This guidance is subject to change. For the most up-to-date guidance from [facility], please refer to their website: YouBlogs.pl     Recommended Follow up Care:  Patient encouraged to follow up with the following provider within the specified time frame, or sooner as dictated by the severity of her symptoms. As always, she was instructed that for any urgent/emergent care needs, she should seek care either here or in the emergency department for more immediate evaluation.   Gertie Baron, DNP, NP-c    Gertie Baron, NP 04/30/20 1241

## 2020-04-30 NOTE — Discharge Instructions (Addendum)
You were seen for cough and Covid test and are being treated for viral upper respiratory infection.   Use over-the-counter dextromethorphan (Dimetapp) to control your cough and guaifenesin (Mucinex) to loosen mucus.  Make sure you stay away from decongestions which can interfere with your blood pressure.  Add Flonase nasal spray to help with your nasal congestion and pressure.  Take care, Dr. Marland Kitchen, NP-c -----    Person Under Monitoring Name: Kristen Strickland  Location: [place] [address]   Infection Prevention Recommendations for Individuals Confirmed to have, or Being Evaluated for, 2019 Novel Coronavirus (COVID-19) Infection Who Receive Care at Home  Individuals who are confirmed to have, or are being evaluated for, COVID-19 should follow the prevention steps below until a healthcare provider or local or state health department says they can return to normal activities.  Stay home except to get medical care You should restrict activities outside your home, except for getting medical care. Do not go to work, school, or public areas, and do not use public transportation or taxis.  Call ahead before visiting your doctor Before your medical appointment, call the healthcare provider and tell them that you have, or are being evaluated for, COVID-19 infection. This will help the healthcare provider's office take steps to keep other people from getting infected. Ask your healthcare provider to call the local or state health department.  Monitor your symptoms Seek prompt medical attention if your illness is worsening (e.g., difficulty breathing). Before going to your medical appointment, call the healthcare provider and tell them that you have, or are being evaluated for, COVID-19 infection. Ask your healthcare provider to call the local or state health department.  Wear a facemask You should wear a facemask that covers your nose and mouth when you are in  the same room with other people and when you visit a healthcare provider. People who live with or visit you should also wear a facemask while they are in the same room with you.  Separate yourself from other people in your home As much as possible, you should stay in a different room from other people in your home. Also, you should use a separate bathroom, if available.  Avoid sharing household items You should not share dishes, drinking glasses, cups, eating utensils, towels, bedding, or other items with other people in your home. After using these items, you should wash them thoroughly with soap and water.  Cover your coughs and sneezes Cover your mouth and nose with a tissue when you cough or sneeze, or you can cough or sneeze into your sleeve. Throw used tissues in a lined trash can, and immediately wash your hands with soap and water for at least 20 seconds or use an alcohol-based hand rub.  Wash your [facility] your hands often and thoroughly with soap and water for at least 20 seconds. You can use an alcohol-based hand sanitizer if soap and water are not available and if your hands are not visibly dirty. Avoid touching your eyes, nose, and mouth with unwashed hands.   Prevention Steps for Caregivers and Household Members of Individuals Confirmed to have, or Being Evaluated for, COVID-19 Infection Being Cared for in the Home  If you live with, or provide care at home for, a person confirmed to have, or being evaluated for, COVID-19 infection please follow these guidelines to prevent infection:  Follow healthcare provider's instructions Make sure that you understand and can help the patient follow any healthcare provider  instructions for all care.  Provide for the patient's basic needs You should help the patient with basic needs in the home and provide support for getting groceries, prescriptions, and other personal needs.  Monitor the patient's symptoms If they are getting  sicker, call his or her medical provider and tell them that the patient has, or is being evaluated for, COVID-19 infection. This will help the healthcare provider's office take steps to keep other people from getting infected. Ask the healthcare provider to call the local or state health department.  Limit the number of people who have contact with the patient If possible, have only one caregiver for the patient. Other household members should stay in another home or place of residence. If this is not possible, they should stay in another room, or be separated from the patient as much as possible. Use a separate bathroom, if available. Restrict visitors who do not have an essential need to be in the home.  Keep older adults, very young children, and other sick people away from the patient Keep older adults, very young children, and those who have compromised immune systems or chronic health conditions away from the patient. This includes people with chronic heart, lung, or kidney conditions, diabetes, and cancer.  Ensure good ventilation Make sure that shared spaces in the home have good air flow, such as from an air conditioner or an opened window, weather permitting.  Wash your hands often Wash your hands often and thoroughly with soap and water for at least 20 seconds. You can use an alcohol based hand sanitizer if soap and water are not available and if your hands are not visibly dirty. Avoid touching your eyes, nose, and mouth with unwashed hands. Use disposable paper towels to dry your hands. If not available, use dedicated cloth towels and replace them when they become wet.  Wear a facemask and gloves Wear a disposable facemask at all times in the room and gloves when you touch or have contact with the patient's blood, body fluids, and/or secretions or excretions, such as sweat, saliva, sputum, nasal mucus, vomit, urine, or feces.  Ensure the mask fits over your nose and mouth tightly,  and do not touch it during use. Throw out disposable facemasks and gloves after using them. Do not reuse. Wash your hands immediately after removing your facemask and gloves. If your personal clothing becomes contaminated, carefully remove clothing and launder. Wash your hands after handling contaminated clothing. Place all used disposable facemasks, gloves, and other waste in a lined container before disposing them with other household waste. Remove gloves and wash your hands immediately after handling these items.  Do not share dishes, glasses, or other household items with the patient Avoid sharing household items. You should not share dishes, drinking glasses, cups, eating utensils, towels, bedding, or other items with a patient who is confirmed to have, or being evaluated for, COVID-19 infection. After the person uses these items, you should wash them thoroughly with soap and water.  Wash laundry thoroughly Immediately remove and wash clothes or bedding that have blood, body fluids, and/or secretions or excretions, such as sweat, saliva, sputum, nasal mucus, vomit, urine, or feces, on them. Wear gloves when handling laundry from the patient. Read and follow directions on labels of laundry or clothing items and detergent. In general, wash and dry with the warmest temperatures recommended on the label.  Clean all areas the individual has used often Clean all touchable surfaces, such as counters, tabletops, doorknobs, bathroom  fixtures, toilets, phones, keyboards, tablets, and bedside tables, every day. Also, clean any surfaces that may have blood, body fluids, and/or secretions or excretions on them. Wear gloves when cleaning surfaces the patient has come in contact with. Use a diluted bleach solution (e.g., dilute bleach with 1 part bleach and 10 parts water) or a household disinfectant with a label that says EPA-registered for coronaviruses. To make a bleach solution at home, add 1 tablespoon  of bleach to 1 quart (4 cups) of water. For a larger supply, add  cup of bleach to 1 gallon (16 cups) of water. Read labels of cleaning products and follow recommendations provided on product labels. Labels contain instructions for safe and effective use of the cleaning product including precautions you should take when applying the product, such as wearing gloves or eye protection and making sure you have good ventilation during use of the product. Remove gloves and wash hands immediately after cleaning.  Monitor yourself for signs and symptoms of illness Caregivers and household members are considered close contacts, should monitor their health, and will be asked to limit movement outside of the home to the extent possible. Follow the monitoring steps for close contacts listed on the symptom monitoring form.   ? If you have additional questions, contact your local health department or call the epidemiologist on call at [phone] (available 24/7). ? This guidance is subject to change. For the most up-to-date guidance from [facility], please refer to their website: [email or web address]

## 2020-04-30 NOTE — ED Triage Notes (Addendum)
Patient in today c/o chills (hasn't taken temperature), cough, body aches and headache x 3 days, worse yesterday and today. Patient has taken OTC Chloracedin and Allegra. Patient completed the Moderna covid vaccine 01/2020.

## 2020-05-01 ENCOUNTER — Telehealth: Payer: Self-pay

## 2020-05-01 LAB — SARS CORONAVIRUS 2 (TAT 6-24 HRS): SARS Coronavirus 2: NEGATIVE

## 2020-05-01 NOTE — Telephone Encounter (Signed)
Pt calls in for test results on COVID. Instructed her she had a negative test and she verbalized understanding. Will follow up as needed

## 2020-05-05 ENCOUNTER — Other Ambulatory Visit: Payer: Self-pay | Admitting: Internal Medicine

## 2020-05-05 ENCOUNTER — Other Ambulatory Visit: Payer: Self-pay | Admitting: Physician Assistant

## 2020-05-24 ENCOUNTER — Other Ambulatory Visit: Payer: Self-pay | Admitting: Internal Medicine

## 2020-05-24 ENCOUNTER — Ambulatory Visit (INDEPENDENT_AMBULATORY_CARE_PROVIDER_SITE_OTHER): Payer: Medicare Other

## 2020-05-24 ENCOUNTER — Encounter: Payer: Self-pay | Admitting: Internal Medicine

## 2020-05-24 ENCOUNTER — Other Ambulatory Visit: Payer: Self-pay

## 2020-05-24 ENCOUNTER — Ambulatory Visit (INDEPENDENT_AMBULATORY_CARE_PROVIDER_SITE_OTHER): Payer: Medicare Other | Admitting: Internal Medicine

## 2020-05-24 DIAGNOSIS — F439 Reaction to severe stress, unspecified: Secondary | ICD-10-CM

## 2020-05-24 DIAGNOSIS — M545 Low back pain, unspecified: Secondary | ICD-10-CM

## 2020-05-24 DIAGNOSIS — R739 Hyperglycemia, unspecified: Secondary | ICD-10-CM

## 2020-05-24 DIAGNOSIS — E785 Hyperlipidemia, unspecified: Secondary | ICD-10-CM | POA: Diagnosis not present

## 2020-05-24 DIAGNOSIS — I10 Essential (primary) hypertension: Secondary | ICD-10-CM

## 2020-05-24 DIAGNOSIS — R109 Unspecified abdominal pain: Secondary | ICD-10-CM | POA: Insufficient documentation

## 2020-05-24 DIAGNOSIS — R1084 Generalized abdominal pain: Secondary | ICD-10-CM | POA: Diagnosis not present

## 2020-05-24 DIAGNOSIS — I6523 Occlusion and stenosis of bilateral carotid arteries: Secondary | ICD-10-CM | POA: Diagnosis not present

## 2020-05-24 DIAGNOSIS — Z0000 Encounter for general adult medical examination without abnormal findings: Secondary | ICD-10-CM

## 2020-05-24 DIAGNOSIS — R8281 Pyuria: Secondary | ICD-10-CM

## 2020-05-24 LAB — URINALYSIS, ROUTINE W REFLEX MICROSCOPIC
Bilirubin Urine: NEGATIVE
Hgb urine dipstick: NEGATIVE
Ketones, ur: NEGATIVE
Nitrite: NEGATIVE
RBC / HPF: NONE SEEN (ref 0–?)
Specific Gravity, Urine: 1.01 (ref 1.000–1.030)
Total Protein, Urine: NEGATIVE
Urine Glucose: NEGATIVE
Urobilinogen, UA: 0.2 (ref 0.0–1.0)
pH: 7 (ref 5.0–8.0)

## 2020-05-24 NOTE — Progress Notes (Signed)
Patient ID: Kristen Strickland, female   DOB: 1946/08/10, 74 y.o.   MRN: [id number]   Subjective:    Patient ID: Kristen Strickland, female    DOB: [birth year], 74 y.o.   MRN: [id number]  HPI This visit occurred during the SARS-CoV-2 public health emergency.  Safety protocols were in place, including screening questions prior to the visit, additional usage of staff PPE, and extensive cleaning of exam room while observing appropriate contact time as indicated for disinfecting solutions.  Patient was scheduled for a physical.  Declines breast exam.  She is s/p L5-S1 anterior lumbar interbody fusion.  Seeing [facility].  On gabapentin.  Appears to be relatively stable.  She does report some abdominal pressure/pain.  States occurs when she lies down on her back - at night - in bed.  Described - "feels like an explosion".  She is eating.  No nausea or vomiting.  Bowels moving.  No increased acid reflux reported.  She was seen at [facility]/30/20 and diagnosed with viral URI.  Note reviewed.  covid negative.  Has had covid vaccines.  Symptoms have resolved.  No cough or congestion.  No chest pain or sob reported.  Sees cardiology.  Does have skin lesion right cheek.  Discussed dermatology evaluation.  She will schedule.    Past Medical History:  Diagnosis Date  . Arthritis   . Carotid arterial disease ([place])    a. 11/2017 Carotid U/S: RICA 6-16%, LICA 07-37%.   . Chronic fatigue   . Chronic leg pain   . DDD (degenerative disc disease), lumbar   . Dyspnea on exertion    a. 02/2017 Echo: EF 60-65%, no rwma, mild MR. Nl RV size/fxn. Nl PASP; b. 02/2017 MV: small, mild, fixed basal inferolateral defect - ? artifact vs scar. No ischemia. EF >65%.  . Hypercholesterolemia   . Hypertension   . Insomnia   . Palpitations    a. 02/2017 Holter: Avg HR 66 (51-115), rare PACs, four brief runs of PAT - up to 5 beats, max rate 157. Rare isolated PVCs. No sustained arrhythmias; b. 03/2018 24h Holter: Rare PACs, 3 beat run PAT (122 bpm), PVCs  (2% of beats).  . Radiculopathy    Lumbar region  . Sinus bradycardia   . Spondylolisthesis    lumbosacral region   Past Surgical History:  Procedure Laterality Date  . ABDOMINAL EXPOSURE N/A 11/10/2018   Procedure: ABDOMINAL EXPOSURE;  Surgeon: Angelia Mould, MD;  Location: [place];  Service: Vascular;  Laterality: N/A;  . ABDOMINAL HYSTERECTOMY  1975  . ANTERIOR CERVICAL DECOMP/DISCECTOMY FUSION N/A 04/09/2013   Procedure: ANTERIOR CERVICAL DECOMPRESSION/DISCECTOMY FUSION 2 LEVELS;  Surgeon: Floyce Stakes, MD;  Location: [facility];  Service: Neurosurgery;  Laterality: N/A;  Cervical five-six Cervical six-seven  Anterior cervical decompression/diskectomy/fusion  . ANTERIOR LUMBAR FUSION N/A 11/10/2018   Procedure: Anterior Lumbar Interbody Fusion-Lumbar five-Sacral one;  Surgeon: Earnie Larsson, MD;  Location: [place];  Service: Neurosurgery;  Laterality: N/A;  . BACK SURGERY     ruptured disc  . RECONSTRUCTION OF NOSE     Family History  Problem Relation Age of Onset  . Cancer Mother        ovarian/uterine  . Heart disease Mother   . Heart attack Mother   . Stroke Mother   . Colon cancer Father   . Dementia Father   . COPD Brother   . Congestive Heart Failure Brother   . Stroke Daughter   . Heart attack Daughter   .  Suicidality Brother   . Breast cancer Neg Hx    Social History   Socioeconomic History  . Marital status: Married    Spouse name: Not on file  . Number of children: 2  . Years of education: Not on file  . Highest education level: Not on file  Occupational History  . Not on file  Tobacco Use  . Smoking status: Never Smoker  . Smokeless tobacco: Never Used  Vaping Use  . Vaping Use: Never used  Substance and Sexual Activity  . Alcohol use: Yes    Alcohol/week: 0.0 standard drinks    Comment: rarely  . Drug use: No  . Sexual activity: Yes  Other Topics Concern  . Not on file  Social History Narrative  . Not on file   Social Determinants of  Health   Financial Resource Strain:   . Difficulty of Paying Living Expenses:   Food Insecurity:   . Worried About Charity fundraiser in the Last Year:   . Arboriculturist in the Last Year:   Transportation Needs:   . Film/video editor (Medical):   Marland Kitchen Lack of Transportation (Non-Medical):   Physical Activity:   . Days of Exercise per Week:   . Minutes of Exercise per Session:   Stress:   . Feeling of Stress :   Social Connections:   . Frequency of Communication with Friends and Family:   . Frequency of Social Gatherings with Friends and Family:   . Attends Religious Services:   . Active Member of Clubs or Organizations:   . Attends Archivist Meetings:   Marland Kitchen Marital Status:     Outpatient Encounter Medications as of 05/24/2020  Medication Sig  . amLODipine (NORVASC) 5 MG tablet TAKE 1 TABLET BY MOUTH ONCE DAILY.  Marland Kitchen aspirin EC 81 MG tablet Take 81 mg by mouth daily.  Marland Kitchen gabapentin (NEURONTIN) 100 MG capsule Take 100 mg by mouth 3 (three) times daily.  Marland Kitchen losartan-hydrochlorothiazide (HYZAAR) 100-25 MG tablet Take 1 tablet by mouth daily.  . metaxalone (SKELAXIN) 800 MG tablet   . Multiple Vitamins-Minerals (CENTRUM PO) Take 1 tablet by mouth daily.   Marland Kitchen omeprazole (PRILOSEC) 20 MG capsule Take 1 capsule (20 mg total) by mouth daily.  . rosuvastatin (CRESTOR) 20 MG tablet TAKE 1 TABLET BY MOUTH ONCE DAILY FOR CHOLESTEROL  . valACYclovir (VALTREX) 1000 MG tablet Take 1 tablet (1,000 mg total) by mouth 3 (three) times daily.   No facility-administered encounter medications on file as of 05/24/2020.    Review of Systems  Constitutional: Negative for appetite change and unexpected weight change.  HENT: Negative for congestion and sinus pressure.   Eyes: Negative for pain and visual disturbance.  Respiratory: Negative for cough, chest tightness and shortness of breath.   Cardiovascular: Negative for chest pain, palpitations and leg swelling.  Gastrointestinal: Positive for  abdominal pain. Negative for diarrhea, nausea and vomiting.  Genitourinary: Negative for difficulty urinating and dysuria.  Musculoskeletal: Negative for joint swelling and myalgias.  Skin: Negative for color change and rash.  Neurological: Negative for dizziness, light-headedness and headaches.  Hematological: Negative for adenopathy. Does not bruise/bleed easily.  Psychiatric/Behavioral: Negative for agitation and dysphoric mood.       Objective:    Physical Exam Vitals reviewed.  Constitutional:      General: She is not in acute distress.    Appearance: Normal appearance.  HENT:     Head: Normocephalic and atraumatic.  Right Ear: External ear normal.     Left Ear: External ear normal.  Eyes:     General: No scleral icterus.       Right eye: No discharge.        Left eye: No discharge.     Conjunctiva/sclera: Conjunctivae normal.  Neck:     Thyroid: No thyromegaly.  Cardiovascular:     Rate and Rhythm: Normal rate and regular rhythm.  Pulmonary:     Effort: No respiratory distress.     Breath sounds: Normal breath sounds. No wheezing.     Comments: She declined to have breast exam.  Abdominal:     General: Bowel sounds are normal.     Palpations: Abdomen is soft.     Tenderness: There is no abdominal tenderness.  Musculoskeletal:        General: No swelling or tenderness.     Cervical back: Neck supple. No tenderness.  Lymphadenopathy:     Cervical: No cervical adenopathy.  Skin:    Findings: No erythema or rash.  Neurological:     Mental Status: She is alert and oriented to person, place, and time.  Psychiatric:        Mood and Affect: Mood normal.        Behavior: Behavior normal.     BP 130/72   Pulse (!) 55   Temp 97.8 F (36.6 C)   Resp 16   Ht _0  (1.626 m)   Wt 138 lb (62.6 kg)   SpO2 99%   BMI 23.69 kg/m  Wt Readings from Last 3 Encounters:  05/24/20 138 lb (62.6 kg)  04/30/20 137 lb 6.4 oz (62.3 kg)  04/03/20 144 lb (65.3 kg)      Lab Results  Component Value Date   WBC 5.3 08/13/2019   HGB 13.6 08/13/2019   HCT 41.9 08/13/2019   PLT 282 08/13/2019   GLUCOSE 101 (H) 03/08/2020   CHOL 152 03/08/2020   TRIG 74.0 03/08/2020   HDL 49.70 03/08/2020   LDLDIRECT 151.7 08/16/2013   LDLCALC 87 03/08/2020   ALT 16 03/08/2020   AST 15 03/08/2020   NA 140 03/08/2020   K 3.9 03/08/2020   CL 103 03/08/2020   CREATININE 0.75 03/08/2020   BUN 18 03/08/2020   CO2 31 03/08/2020   TSH 4.220 08/13/2019   HGBA1C 6.0 03/08/2020       Assessment & Plan:   Problem List Items Addressed This Visit    Abdominal pain    Abdominal pain as outlined.  Unclear etiology.  Only occurs when she lies down at night.  Did not occur lying down on exam.  No pain to palpation.  Check KUB.  Discussed CT.  Check urine.  No nausea or vomiting.  Bowels stable.        Relevant Orders   DG Abd 1 View (Completed)   Urinalysis, Routine w reflex microscopic (Completed)   Back pain    S/p surgery as outlined.  Followed by [facility]. On gabapentin.  Follow.        Carotid stenosis    Evaluated by Dr Delana Meyer 12/2019.  Recommended f/u in 6 months.  Continue crestor and aspirin.        Health care maintenance    Physical today 05/24/20.  Declined breast exam.  Mammogram 11/19/19 - Birads I.  Need colonoscopy report.  Nurse to request.       Hyperglycemia    Low carb diet and exercise.  Follow met  b and a1c.       Relevant Orders   Hemoglobin A1c   Hyperlipidemia LDL goal <70    On crestor.  Low cholesterol diet and exercise.  Follow lipid panel and liver function tests.   Lab Results  Component Value Date   CHOL 152 03/08/2020   HDL 49.70 03/08/2020   LDLCALC 87 03/08/2020   LDLDIRECT 151.7 08/16/2013   TRIG 74.0 03/08/2020   CHOLHDL 3 03/08/2020        Relevant Orders   Hepatic function panel   Lipid panel   Hypertension    Blood pressure as outlined.  Continue losartan/hctz and amlodipine.  Follow pressures.  Follow metabolic  panel.        Relevant Orders   CBC with Differential/Platelet   TSH   Basic metabolic panel   Stress    Increased stress.  Discussed.  Overall appears to be handling things well.  Follow.            Einar Pheasant, MD

## 2020-05-24 NOTE — Progress Notes (Signed)
Order placed for add on urine culture ?

## 2020-05-25 ENCOUNTER — Other Ambulatory Visit: Payer: Medicare Other

## 2020-05-25 DIAGNOSIS — R8281 Pyuria: Secondary | ICD-10-CM | POA: Diagnosis not present

## 2020-05-27 LAB — URINE CULTURE
MICRO NUMBER:: 10791697
SPECIMEN QUALITY:: ADEQUATE

## 2020-05-29 ENCOUNTER — Encounter: Payer: Self-pay | Admitting: Internal Medicine

## 2020-05-29 NOTE — Assessment & Plan Note (Signed)
Low carb diet and exercise.  Follow met b and a1c.  

## 2020-05-29 NOTE — Assessment & Plan Note (Signed)
Physical today 05/24/20.  Declined breast exam.  Mammogram 11/19/19 - Birads I.  Need colonoscopy report.  Nurse to request.

## 2020-05-29 NOTE — Assessment & Plan Note (Signed)
Abdominal pain as outlined.  Unclear etiology.  Only occurs when she lies down at night.  Did not occur lying down on exam.  No pain to palpation.  Check KUB.  Discussed CT.  Check urine.  No nausea or vomiting.  Bowels stable.

## 2020-05-29 NOTE — Assessment & Plan Note (Signed)
Blood pressure as outlined.  Continue losartan/hctz and amlodipine.  Follow pressures.  Follow metabolic panel.

## 2020-05-29 NOTE — Assessment & Plan Note (Signed)
On crestor.  Low cholesterol diet and exercise.  Follow lipid panel and liver function tests.   Lab Results  Component Value Date   CHOL 152 03/08/2020   HDL 49.70 03/08/2020   LDLCALC 87 03/08/2020   LDLDIRECT 151.7 08/16/2013   TRIG 74.0 03/08/2020   CHOLHDL 3 03/08/2020

## 2020-05-29 NOTE — Assessment & Plan Note (Signed)
S/p surgery as outlined.  Followed by [facility]. On gabapentin.  Follow.

## 2020-05-29 NOTE — Assessment & Plan Note (Signed)
Increased stress.  Discussed.  Overall appears to be handling things well. Follow.  

## 2020-05-29 NOTE — Assessment & Plan Note (Signed)
Evaluated by Dr Delana Meyer 12/2019.  Recommended f/u in 6 months.  Continue crestor and aspirin.

## 2020-06-01 DIAGNOSIS — M4317 Spondylolisthesis, lumbosacral region: Secondary | ICD-10-CM | POA: Diagnosis not present

## 2020-06-06 ENCOUNTER — Other Ambulatory Visit: Payer: Self-pay | Admitting: Internal Medicine

## 2020-06-09 DIAGNOSIS — D225 Melanocytic nevi of trunk: Secondary | ICD-10-CM | POA: Diagnosis not present

## 2020-06-09 DIAGNOSIS — L820 Inflamed seborrheic keratosis: Secondary | ICD-10-CM | POA: Diagnosis not present

## 2020-06-09 DIAGNOSIS — L821 Other seborrheic keratosis: Secondary | ICD-10-CM | POA: Diagnosis not present

## 2020-06-09 DIAGNOSIS — L570 Actinic keratosis: Secondary | ICD-10-CM | POA: Diagnosis not present

## 2020-06-15 ENCOUNTER — Telehealth: Payer: Self-pay | Admitting: Internal Medicine

## 2020-06-15 NOTE — Telephone Encounter (Signed)
Pt stated that you had mentioned doing an xray on her for the pain she is having under her breast into her abdomen. Was discussed at last appt. Pain is intermittent. Not having any acute issues at this time. She was wondering if there was some type of monitor or something she could wear. I was not sure if you were aware of what she is talking about. She kept telling me to ask you.

## 2020-06-15 NOTE — Telephone Encounter (Signed)
Pt has a few questions about chest xray and wearing a monitor for the pain in her stomach and under breast. States that PCP will know what she is talking about. Please call back to advise

## 2020-06-15 NOTE — Telephone Encounter (Signed)
LMTCB

## 2020-06-15 NOTE — Telephone Encounter (Signed)
In reviewing my last office note and her xray result note, she had reported to me having increased episodes of abdominal pian - feeling like an explosion, etc.  See xray result note.  She declined CT scan at that time.  Is she still having pain?  If so, is she agreeable to the scan?  Also, we can arrange f/u with cardiology.  Is she having new symptoms?

## 2020-06-21 NOTE — Telephone Encounter (Signed)
LMTCB

## 2020-06-23 NOTE — Telephone Encounter (Signed)
LMTCB closing note since 3 attempts unable to reach

## 2020-06-27 ENCOUNTER — Other Ambulatory Visit (INDEPENDENT_AMBULATORY_CARE_PROVIDER_SITE_OTHER): Payer: Self-pay | Admitting: Vascular Surgery

## 2020-06-27 DIAGNOSIS — I6523 Occlusion and stenosis of bilateral carotid arteries: Secondary | ICD-10-CM

## 2020-06-28 ENCOUNTER — Ambulatory Visit (INDEPENDENT_AMBULATORY_CARE_PROVIDER_SITE_OTHER): Payer: Medicare Other | Admitting: Nurse Practitioner

## 2020-06-28 ENCOUNTER — Ambulatory Visit (INDEPENDENT_AMBULATORY_CARE_PROVIDER_SITE_OTHER): Payer: Medicare Other

## 2020-06-28 ENCOUNTER — Other Ambulatory Visit: Payer: Self-pay

## 2020-06-28 DIAGNOSIS — I6523 Occlusion and stenosis of bilateral carotid arteries: Secondary | ICD-10-CM | POA: Diagnosis not present

## 2020-06-30 ENCOUNTER — Ambulatory Visit (INDEPENDENT_AMBULATORY_CARE_PROVIDER_SITE_OTHER): Payer: Medicare Other | Admitting: Nurse Practitioner

## 2020-06-30 ENCOUNTER — Encounter (INDEPENDENT_AMBULATORY_CARE_PROVIDER_SITE_OTHER): Payer: Self-pay | Admitting: Vascular Surgery

## 2020-06-30 ENCOUNTER — Encounter (INDEPENDENT_AMBULATORY_CARE_PROVIDER_SITE_OTHER): Payer: Medicare Other

## 2020-07-26 ENCOUNTER — Telehealth: Payer: Self-pay | Admitting: Internal Medicine

## 2020-07-26 NOTE — Telephone Encounter (Signed)
Transferred to Access Nurse  Pt called reporting that she is having swelling in her leg and foot. It's hot to the touch and is tight

## 2020-07-27 ENCOUNTER — Other Ambulatory Visit: Payer: Self-pay

## 2020-07-27 ENCOUNTER — Ambulatory Visit
Admission: RE | Admit: 2020-07-27 | Discharge: 2020-07-27 | Discharge status: Absent without leave | Payer: Self-pay | Source: Ambulatory Visit

## 2020-07-27 ENCOUNTER — Ambulatory Visit
Admission: EM | Admit: 2020-07-27 | Discharge: 2020-07-27 | Disposition: A | Discharge status: Home / Self Care | Payer: Medicare Other | Attending: Physician Assistant | Admitting: Physician Assistant

## 2020-07-27 DIAGNOSIS — M545 Low back pain, unspecified: Secondary | ICD-10-CM | POA: Insufficient documentation

## 2020-07-27 DIAGNOSIS — R2242 Localized swelling, mass and lump, left lower limb: Secondary | ICD-10-CM | POA: Diagnosis not present

## 2020-07-27 DIAGNOSIS — M79672 Pain in left foot: Secondary | ICD-10-CM

## 2020-07-27 DIAGNOSIS — M5416 Radiculopathy, lumbar region: Secondary | ICD-10-CM | POA: Diagnosis not present

## 2020-07-27 DIAGNOSIS — M25562 Pain in left knee: Secondary | ICD-10-CM | POA: Diagnosis not present

## 2020-07-27 DIAGNOSIS — G8929 Other chronic pain: Secondary | ICD-10-CM

## 2020-07-27 LAB — URIC ACID: Uric Acid, Serum: 5 mg/dL (ref 2.5–7.1)

## 2020-07-27 MED ORDER — PREDNISONE 10 MG PO TABS: ORAL_TABLET | ORAL | 0 refills | Status: DC

## 2020-07-27 MED ORDER — TRAMADOL HCL 50 MG PO TABS: 50.0000 mg | ORAL_TABLET | Freq: Four times a day (QID) | ORAL | 0 refills | Status: AC | PRN

## 2020-07-27 NOTE — Telephone Encounter (Signed)
Patient has appointment today 0900 at an UC.  [facility] [place] RECORD [facility] Patient Name: Kristen Strickland Gender: Female DOB: 1946/05/22 Age: 74 Y 3 M 1 D Return Phone Number: [phone] (Primary), [phone] (Secondary) Address: City/State/Zip: [place] [address] Client [place] [facility] [place] Client Site [facility] - Day Physician Einar Pheasant - MD Contact Type Call Who Is Calling Patient / Member / Family / Caregiver Call Type Triage / Clinical Relationship To Patient Self Return Phone Number [phone] (Primary) Chief Complaint Leg Swelling And Edema Reason for Call Symptomatic / Request for Maish Vaya from [place] station transferring patient to be triaged. It hurts to stand and walk on it. It is very tight and hot to touch. She said it has gotten worse since Dr. Nicki Reaper looked at it. [facility] Translation No Nurse Assessment Nurse: Sherryle Lis, RN, [place] Date/Time Eilene Ghazi Time): 07/26/2020 4:57:51 PM Confirm and document reason for call. If symptomatic, describe symptoms. ---Pts left leg is tight and hot to the touch and its also her foot and knee. She cant stand on it at all. No injury. Not sure about fever. It started about a week ago and it was sudden Does the patient have any new or worsening symptoms? ---Yes Will a triage be completed? ---Yes Related visit to physician within the last 2 weeks? ---No Does the PT have any chronic conditions? (i.e. diabetes, asthma, this includes High risk factors for pregnancy, etc.) ---Yes List chronic conditions. ---High BP Is this a behavioral health or substance abuse call? ---No Guidelines Guideline Title Affirmed Question Affirmed Notes Nurse Date/Time Eilene Ghazi Time) Leg Swelling and Edema [1] Can't walk or can barely walk AND  [2] new-onset McDonald, RN, [place] 07/26/2020 4:59:54 PM Disp. Time Eilene Ghazi Time) Disposition Final User 07/26/2020 5:03:50 PM Go to ED Now Yes McDonald, RN, [place] PLEASE NOTE: All timestamps contained within this report are represented as [place] Standard Time. CONFIDENTIALTY NOTICE: This fax transmission is intended only for the addressee. It contains information that is legally privileged, confidential or otherwise protected from use or disclosure. If you are not the intended recipient, you are strictly prohibited from reviewing, disclosing, copying using or disseminating any of this information or taking any action in reliance on or regarding this information. If you have received this fax in error, please notify us immediately by telephone so that we can arrange for its return to [place]. Phone: [phone], Toll-Free: [phone], Fax: [phone] Page: 2 of 2 Call Id: [id number] [place] Disagree/Comply Disagree Caller Understands Yes PreDisposition Go to Urgent Care/Walk-In Clinic Care Advice Given Per Guideline GO TO ED NOW: * You need to be seen in the Emergency Department. * Go to the ED at ___________ Hospital. Comments User: [name], RN Date/Time Eilene Ghazi Time): 07/26/2020 5:04:38 PM Pt cant go to the ER now due to traveling back home and she states "I will go to the ER tomorrow" User: [name], RN Date/Time Eilene Ghazi Time): 07/26/2020 5:04:55 PM Pt didnt want to be seen in the office. Referrals GO TO FACILITY OTHER - SPECIFY

## 2020-07-27 NOTE — ED Triage Notes (Signed)
Pt reports having L foot pain and swelling x5-7 days. No known injury to foot. Worsening pain with ambulation.

## 2020-07-27 NOTE — Telephone Encounter (Signed)
Per note, urgent care - being evaluated.

## 2020-07-27 NOTE — Telephone Encounter (Signed)
Pt at urgent care.  

## 2020-07-27 NOTE — Discharge Instructions (Addendum)
I have ordered an ultrasound of your left extremity to assess for possible DVT.  Someone will call you to have this scheduled for in the morning.  We have also drawn blood to check the uric acid level which could indicate gout.  I believe your left foot swelling/pain and medial knee pain are connected to your chronic back issues.  At this time try the prednisone to help with swelling.  Also use a compression hose and keep the foot elevated.  You can take the tramadol as needed for pain.  If our testing is negative you should follow-up with your back doctor and foot doctor.  Go to emergency department for any severe acute worsening of the leg pain or if you develop a fever, or weakness.  Also go if you suddenly develop any chest pain or breathing difficulty.

## 2020-07-27 NOTE — ED Triage Notes (Signed)
[place] scheduled at [facility] 10/08 with 10:45 arrival.  Pt aware.  Noted on AVS.

## 2020-07-27 NOTE — ED Provider Notes (Signed)
[facility]    CSN: [id number] Arrival date & time: 07/27/20  2202      History   Chief Complaint Chief Complaint  Patient presents with  . Appointment  . Foot Pain    Left    HPI Kristen Strickland is a 74 y.o. female presenting for swelling of the left foot over the past 5 to 7 days.  She states that it is very swollen and painful to walk on.  She also has some pain of the medial knee.  Patient does admit to issues with the same foot in the past.  She says that in August 2020 she did have a venous ultrasound and x-ray of left foot which were negative.  She states that she was told that her foot pain and swelling could be connected to her many back issues.  Patient had back surgery January 2020.  She states that she believes she has been having increased swelling in the foot since the surgery.  Patient denies any numbness, weakness or tingling.  Patient says that she has seen a foot specialist as well as the back specialist.  She was told by the foot specialist several months ago that her foot was not the issue that the problem was her back.  She has taken tramadol as needed for pain relief.  Patient states that her current swelling is much worse than it was when she had all the testing performed a year ago.  Patient denies any fever.  She does admit to some warmth and a little bit of redness of the lateral foot and ankle.  Patient denies any personal history of DVT or PE.  Patient admits to mild chronic back pain currently which is all the way across the back and a little bit worse on the right side.  Patient says she is afraid for a blood clot since the swelling is so bad and worse than it was last time she was checked out.  She denies any other concerns or complaints today.  HPI  Past Medical History:  Diagnosis Date  . Arthritis   . Carotid arterial disease ([place])    a. 11/2017 Carotid U/S: RICA 5-42%, LICA 70-62%.   . Chronic fatigue   . Chronic leg pain   . DDD  (degenerative disc disease), lumbar   . Dyspnea on exertion    a. 02/2017 Echo: EF 60-65%, no rwma, mild MR. Nl RV size/fxn. Nl PASP; b. 02/2017 MV: small, mild, fixed basal inferolateral defect - ? artifact vs scar. No ischemia. EF >65%.  . Hypercholesterolemia   . Hypertension   . Insomnia   . Palpitations    a. 02/2017 Holter: Avg HR 66 (51-115), rare PACs, four brief runs of PAT - up to 5 beats, max rate 157. Rare isolated PVCs. No sustained arrhythmias; b. 03/2018 24h Holter: Rare PACs, 3 beat run PAT (122 bpm), PVCs (2% of beats).  . Radiculopathy    Lumbar region  . Sinus bradycardia   . Spondylolisthesis    lumbosacral region    Patient Active Problem List   Diagnosis Date Noted  . Abdominal pain 05/24/2020  . Bilateral leg pain 08/13/2019  . Sleeping difficulties 06/29/2019  . Left foot pain 05/30/2019  . Hyperglycemia 05/30/2019  . PAC (premature atrial contraction) 01/27/2019  . PVC (premature ventricular contraction) 01/27/2019  . Leg swelling 01/27/2019  . Bilateral carotid artery stenosis 01/27/2019  . Degenerative spondylolisthesis 11/10/2018  . Decreased sense of taste 10/12/2018  .  Impingement syndrome, shoulder, right 04/03/2018  . Swelling of right hand 11/29/2017  . Fatigue 11/29/2017  . Palpitations 06/11/2017  . Dyspnea on exertion 09/30/2016  . Carotid stenosis 09/17/2015  . Back pain 08/13/2015  . Health care maintenance 02/08/2015  . Stress 07/03/2014  . Arthritis, degenerative 05/03/2014  . Right knee pain 03/27/2014  . Menopausal symptoms 02/27/2014  . Dizziness 01/08/2014  . Right shoulder pain 04/26/2013  . Hypertension 08/23/2012  . Hyperlipidemia LDL goal <70 08/23/2012    Past Surgical History:  Procedure Laterality Date  . ABDOMINAL EXPOSURE N/A 11/10/2018   Procedure: ABDOMINAL EXPOSURE;  Surgeon: Angelia Mould, MD;  Location: [place];  Service: Vascular;  Laterality: N/A;  . ABDOMINAL HYSTERECTOMY  1975  . ANTERIOR CERVICAL  DECOMP/DISCECTOMY FUSION N/A 04/09/2013   Procedure: ANTERIOR CERVICAL DECOMPRESSION/DISCECTOMY FUSION 2 LEVELS;  Surgeon: Floyce Stakes, MD;  Location: [facility];  Service: Neurosurgery;  Laterality: N/A;  Cervical five-six Cervical six-seven  Anterior cervical decompression/diskectomy/fusion  . ANTERIOR LUMBAR FUSION N/A 11/10/2018   Procedure: Anterior Lumbar Interbody Fusion-Lumbar five-Sacral one;  Surgeon: Earnie Larsson, MD;  Location: [facility];  Service: Neurosurgery;  Laterality: N/A;  . BACK SURGERY     ruptured disc  . RECONSTRUCTION OF NOSE      OB History   No obstetric history on file.      Home Medications    Prior to Admission medications   Medication Sig Start Date End Date Taking? Authorizing Provider  amLODipine (NORVASC) 5 MG tablet TAKE 1 TABLET BY MOUTH ONCE DAILY. 05/05/20   Rise Mu, PA-C  aspirin EC 81 MG tablet Take 81 mg by mouth daily.    [provider]  gabapentin (NEURONTIN) 100 MG capsule Take 100 mg by mouth 3 (three) times daily. 11/30/19   [provider]  losartan-hydrochlorothiazide (HYZAAR) 100-25 MG tablet TAKE 1 TABLET BY MOUTH ONCE DAILY. 06/06/20   Einar Pheasant, MD  metaxalone [facility]) 800 MG tablet  02/03/20   [provider]  Multiple Vitamins-Minerals (CENTRUM PO) Take 1 tablet by mouth daily.     [provider]  omeprazole (PRILOSEC) 20 MG capsule Take 1 capsule (20 mg total) by mouth daily. 02/15/20   Einar Pheasant, MD  predniSONE (DELTASONE) 10 MG tablet Take 6 tabs by mouth today and decrease by 1 tablet for the next 5 days 07/27/20   Laurene Footman B, PA-C  rosuvastatin (CRESTOR) 20 MG tablet TAKE 1 TABLET BY MOUTH ONCE DAILY FOR CHOLESTEROL 05/05/20   End, Harrell Gave, MD  traMADol (ULTRAM) 50 MG tablet Take 1 tablet (50 mg total) by mouth every 6 (six) hours as needed for up to 5 days. 07/27/20 08/01/20  Danton Clap, PA-C  valACYclovir (VALTREX) 1000 MG tablet Take 1 tablet (1,000 mg total) by  mouth 3 (three) times daily. 10/28/19   Coral Spikes, DO    Family History Family History  Problem Relation Age of Onset  . Cancer Mother        ovarian/uterine  . Heart disease Mother   . Heart attack Mother   . Stroke Mother   . Colon cancer Father   . Dementia Father   . COPD Brother   . Congestive Heart Failure Brother   . Stroke Daughter   . Heart attack Daughter   . Suicidality Brother   . Breast cancer Neg Hx     Social History Social History   Tobacco Use  . Smoking status: Never Smoker  .  Smokeless tobacco: Never Used  Vaping Use  . Vaping Use: Never used  Substance Use Topics  . Alcohol use: Yes    Alcohol/week: 0.0 standard drinks    Comment: rarely  . Drug use: No     Allergies   Patient has no known allergies.   Review of Systems Review of Systems  Constitutional: Negative for fatigue and fever.  Respiratory: Negative for cough and shortness of breath.   Cardiovascular: Negative for chest pain.  Gastrointestinal: Positive for nausea (when she has severe pain). Negative for abdominal pain and vomiting.  Genitourinary: Negative for dysuria and flank pain.  Musculoskeletal: Positive for arthralgias, back pain, gait problem and joint swelling.  Skin: Positive for color change. Negative for wound.  Neurological: Negative for dizziness, weakness and numbness.     Physical Exam Triage Vital Signs ED Triage Vitals  Enc Vitals Group     BP 07/27/20 0913 (!) 112/52     Pulse Rate 07/27/20 0913 67     Resp 07/27/20 0913 16     Temp 07/27/20 0913 98 F (36.7 C)     Temp Source 07/27/20 0913 Oral     SpO2 --      Weight 07/27/20 0914 142 lb (64.4 kg)     Height 07/27/20 0914 5\' 4"  (1.626 m)     Head Circumference --      Peak Flow --      Pain Score 07/27/20 0914 5     Pain Loc --      Pain Edu? --      Excl. in [place]? --    No data found.  Updated Vital Signs BP (!) 112/52   Pulse 67   Temp 98 F (36.7 C) (Oral)   Resp 16   Ht 5\' 4"   (1.626 m)   Wt 142 lb (64.4 kg)   BMI 24.37 kg/m       Physical Exam Vitals and nursing note reviewed.  Constitutional:      General: She is not in acute distress.    Appearance: Normal appearance. She is not ill-appearing or toxic-appearing.  HENT:     Head: Normocephalic and atraumatic.  Eyes:     General: No scleral icterus.       Right eye: No discharge.        Left eye: No discharge.     Conjunctiva/sclera: Conjunctivae normal.  Cardiovascular:     Rate and Rhythm: Normal rate and regular rhythm.     Heart sounds: Normal heart sounds.  Pulmonary:     Effort: Pulmonary effort is normal. No respiratory distress.     Breath sounds: Normal breath sounds.  Abdominal:     Palpations: Abdomen is soft.     Tenderness: There is no abdominal tenderness. There is no right CVA tenderness or left CVA tenderness.  Musculoskeletal:     Cervical back: Neck supple.     Lumbar back: Tenderness (diffuse TTP bilateral lumbosacral region) present. Normal range of motion. Negative right straight leg raise test and negative left straight leg raise test.     Left knee: No swelling, erythema, ecchymosis or bony tenderness. Normal range of motion (painful full flexion and extension of knee). Tenderness present over the medial joint line.     Left ankle: Swelling (moderate swelling lateral ankle and dorsal foot diffusely) present. No ecchymosis (there is fine petechia of the dorsal left foot, very mild). Tenderness (diffuse mild TTP dorsal foot) present. Decreased range of motion (painful ROM  of foot and ankle). Normal pulse.     Comments: Negative calf pain or swelling  Skin:    General: Skin is dry.  Neurological:     General: No focal deficit present.     Mental Status: She is alert and oriented to person, place, and time. Mental status is at baseline.     Motor: No weakness.     Gait: Gait abnormal.  Psychiatric:        Mood and Affect: Mood normal.        Behavior: Behavior normal.         Thought Content: Thought content normal.      UC Treatments / Results  Labs (all labs ordered are listed, but only abnormal results are displayed) Labs Reviewed  URIC ACID    EKG   Radiology No results found.  Procedures Procedures (including critical care time)  Medications Ordered in UC Medications - No data to display  Initial Impression / Assessment and Plan / UC Course  I have reviewed the triage vital signs and the nursing notes.  Pertinent labs & imaging results that were available during my care of the patient were reviewed by me and considered in my medical decision making (see chart for details).   On exam patient does have moderate swelling of the left foot and ankle.  She has diffuse poorly localized tenderness of the dorsal foot.  She also has tenderness of the medial knee.  Mild diffuse lumbosacral tenderness.  Negative straight leg raise bilaterally.  Since patient says that her swelling is much worse than it was a year ago, performing another venous Doppler ultrasound of the left extremity to assess for possible DVT.  Patient also mentions concerns for possible gout although I feel that is unlikely.  Uric acid drawn today.  Advised patient that her leg pain and swelling is likely due to her lumbar radiculopathy.  She ultimately should follow back up with her spine specialist and podiatrist.  If testing for DVT is positive we will start her on anticoagulants.  At this time treating for the swelling with prednisone.  Also advised her to purchase a compression hose and keep the foot elevated as often as possible.  Short supply of Ultram given for pain control.  ED precautions discussed for any acute worsening of symptoms.  Patient understanding and agreeable.   Final Clinical Impressions(s) / UC Diagnoses   Final diagnoses:  Localized swelling of left foot  Left foot pain  Acute pain of left knee  Lumbar radiculopathy  Chronic low back pain, unspecified back pain  laterality, unspecified whether sciatica present     Discharge Instructions     I have ordered an ultrasound of your left extremity to assess for possible DVT.  Someone will call you to have this scheduled for in the morning.  We have also drawn blood to check the uric acid level which could indicate gout.  I believe your left foot swelling/pain and medial knee pain are connected to your chronic back issues.  At this time try the prednisone to help with swelling.  Also use a compression hose and keep the foot elevated.  You can take the tramadol as needed for pain.  If our testing is negative you should follow-up with your back doctor and foot doctor.  Go to emergency department for any severe acute worsening of the leg pain or if you develop a fever, or weakness.  Also go if you suddenly develop any chest pain  or breathing difficulty.    ED Prescriptions    Medication Sig Dispense Auth. Provider   predniSONE (DELTASONE) 10 MG tablet Take 6 tabs by mouth today and decrease by 1 tablet for the next 5 days 21 tablet Laurene Footman B, PA-C   traMADol (ULTRAM) 50 MG tablet Take 1 tablet (50 mg total) by mouth every 6 (six) hours as needed for up to 5 days. 15 tablet Danton Clap, PA-C     I have reviewed the PDMP during this encounter.   Danton Clap, PA-C 07/27/20 1056

## 2020-07-28 ENCOUNTER — Other Ambulatory Visit: Payer: Self-pay

## 2020-07-28 ENCOUNTER — Ambulatory Visit: Admit: 2020-07-28 | Payer: Medicare Other

## 2020-07-28 ENCOUNTER — Ambulatory Visit
Admission: RE | Admit: 2020-07-28 | Discharge: 2020-07-28 | Disposition: A | Discharge status: Home / Self Care | Payer: Medicare Other | Source: Ambulatory Visit | Attending: Family Medicine | Admitting: Family Medicine

## 2020-07-28 ENCOUNTER — Telehealth: Payer: Self-pay | Admitting: Family Medicine

## 2020-07-28 DIAGNOSIS — R2242 Localized swelling, mass and lump, left lower limb: Secondary | ICD-10-CM | POA: Diagnosis not present

## 2020-07-28 DIAGNOSIS — M5416 Radiculopathy, lumbar region: Secondary | ICD-10-CM | POA: Diagnosis not present

## 2020-07-28 DIAGNOSIS — M7122 Synovial cyst of popliteal space [Baker], left knee: Secondary | ICD-10-CM | POA: Diagnosis not present

## 2020-07-28 DIAGNOSIS — M545 Low back pain, unspecified: Secondary | ICD-10-CM | POA: Diagnosis not present

## 2020-07-28 DIAGNOSIS — M79672 Pain in left foot: Secondary | ICD-10-CM | POA: Diagnosis not present

## 2020-07-28 DIAGNOSIS — G8929 Other chronic pain: Secondary | ICD-10-CM | POA: Diagnosis not present

## 2020-07-28 DIAGNOSIS — M7989 Other specified soft tissue disorders: Secondary | ICD-10-CM | POA: Diagnosis not present

## 2020-07-28 DIAGNOSIS — M25562 Pain in left knee: Secondary | ICD-10-CM | POA: Diagnosis not present

## 2020-07-28 NOTE — Telephone Encounter (Signed)
[place] reordered as is was cancelled.  [facility]

## 2020-08-03 ENCOUNTER — Other Ambulatory Visit: Payer: Self-pay

## 2020-08-03 ENCOUNTER — Other Ambulatory Visit (INDEPENDENT_AMBULATORY_CARE_PROVIDER_SITE_OTHER): Payer: Medicare Other

## 2020-08-03 DIAGNOSIS — E785 Hyperlipidemia, unspecified: Secondary | ICD-10-CM

## 2020-08-03 DIAGNOSIS — R739 Hyperglycemia, unspecified: Secondary | ICD-10-CM | POA: Diagnosis not present

## 2020-08-03 DIAGNOSIS — I10 Essential (primary) hypertension: Secondary | ICD-10-CM

## 2020-08-03 LAB — CBC WITH DIFFERENTIAL/PLATELET
Basophils Absolute: 0.1 10*3/uL (ref 0.0–0.1)
Basophils Relative: 1 % (ref 0.0–3.0)
Eosinophils Absolute: 0.2 10*3/uL (ref 0.0–0.7)
Eosinophils Relative: 3.6 % (ref 0.0–5.0)
HCT: 36.3 % (ref 36.0–46.0)
Hemoglobin: 12 g/dL (ref 12.0–15.0)
Lymphocytes Relative: 28 % (ref 12.0–46.0)
Lymphs Abs: 1.9 10*3/uL (ref 0.7–4.0)
MCHC: 33 g/dL (ref 30.0–36.0)
MCV: 87.8 fl (ref 78.0–100.0)
Monocytes Absolute: 0.4 10*3/uL (ref 0.1–1.0)
Monocytes Relative: 6.4 % (ref 3.0–12.0)
Neutro Abs: 4.1 10*3/uL (ref 1.4–7.7)
Neutrophils Relative %: 61 % (ref 43.0–77.0)
Platelets: 402 10*3/uL — ABNORMAL HIGH (ref 150.0–400.0)
RBC: 4.14 Mil/uL (ref 3.87–5.11)
RDW: 13.3 % (ref 11.5–15.5)
WBC: 6.8 10*3/uL (ref 4.0–10.5)

## 2020-08-03 LAB — HEPATIC FUNCTION PANEL
ALT: 33 U/L (ref 0–35)
AST: 15 U/L (ref 0–37)
Albumin: 3.6 g/dL (ref 3.5–5.2)
Alkaline Phosphatase: 70 U/L (ref 39–117)
Bilirubin, Direct: 0.1 mg/dL (ref 0.0–0.3)
Total Bilirubin: 0.5 mg/dL (ref 0.2–1.2)
Total Protein: 6.1 g/dL (ref 6.0–8.3)

## 2020-08-03 LAB — LIPID PANEL
Cholesterol: 148 mg/dL (ref 0–200)
HDL: 48.2 mg/dL (ref >39.00)
LDL Cholesterol: 82 mg/dL (ref 0–99)
NonHDL: 99.92
Total CHOL/HDL Ratio: 3
Triglycerides: 91 mg/dL (ref 0.0–149.0)
VLDL: 18.2 mg/dL (ref 0.0–40.0)

## 2020-08-03 LAB — BASIC METABOLIC PANEL
BUN: 18 mg/dL (ref 6–23)
CO2: 29 mEq/L (ref 19–32)
Calcium: 9.1 mg/dL (ref 8.4–10.5)
Chloride: 103 mEq/L (ref 96–112)
Creatinine, Ser: 0.78 mg/dL (ref 0.40–1.20)
GFR: 75.27 mL/min (ref >60.00)
Glucose, Bld: 89 mg/dL (ref 70–99)
Potassium: 3.5 mEq/L (ref 3.5–5.1)
Sodium: 140 mEq/L (ref 135–145)

## 2020-08-03 LAB — TSH: TSH: 3.62 u[IU]/mL (ref 0.35–4.50)

## 2020-08-03 LAB — HEMOGLOBIN A1C: Hgb A1c MFr Bld: 6.4 % (ref 4.6–6.5)

## 2020-08-08 ENCOUNTER — Other Ambulatory Visit: Payer: Self-pay

## 2020-08-08 ENCOUNTER — Encounter: Payer: Self-pay | Admitting: Internal Medicine

## 2020-08-08 ENCOUNTER — Ambulatory Visit (INDEPENDENT_AMBULATORY_CARE_PROVIDER_SITE_OTHER): Payer: Medicare Other | Admitting: Internal Medicine

## 2020-08-08 DIAGNOSIS — F439 Reaction to severe stress, unspecified: Secondary | ICD-10-CM

## 2020-08-08 DIAGNOSIS — I6523 Occlusion and stenosis of bilateral carotid arteries: Secondary | ICD-10-CM

## 2020-08-08 DIAGNOSIS — M545 Low back pain, unspecified: Secondary | ICD-10-CM | POA: Diagnosis not present

## 2020-08-08 DIAGNOSIS — E785 Hyperlipidemia, unspecified: Secondary | ICD-10-CM | POA: Diagnosis not present

## 2020-08-08 DIAGNOSIS — I10 Essential (primary) hypertension: Secondary | ICD-10-CM

## 2020-08-08 DIAGNOSIS — M79672 Pain in left foot: Secondary | ICD-10-CM

## 2020-08-08 DIAGNOSIS — R739 Hyperglycemia, unspecified: Secondary | ICD-10-CM

## 2020-08-08 NOTE — Progress Notes (Signed)
Patient ID: Kristen Strickland, female   DOB: 11-28-1945, 74 y.o.   MRN: [id number]   Subjective:    Patient ID: Kristen Strickland, female    DOB: 1946/03/29, 74 y.o.   MRN: [id number]  HPI This visit occurred during the SARS-CoV-2 public health emergency.  Safety protocols were in place, including screening questions prior to the visit, additional usage of staff PPE, and extensive cleaning of exam room while observing appropriate contact time as indicated for disinfecting solutions.  Patient here for a scheduled follow up.  Here to follow up regarding her cholesterol, blood pressure and sugar.  She is having increased back pain.  Has back issues.  S/p back lumbar back surgery 10/2018.  Recently (over the last 2-3 weeks), noticed increased pain.  Pain in her left foot.  Numbness and tingling in her foot.  Increased swelling recently.  Evaluated at urgent care for swelling.  Ultrasound negative for DVT.  Was given steroid dose pack.  Better now.  Wearing a brace.  This is helping as well.  Able to walk with brace.  Swelling is better.  Still with some pedal swelling.  No redness.  No calf tenderness.  Due to f/u with NSU in two days.  No nausea or vomiting.  Taking gabapentin.  Off tramadol.  Not taking anything now other than the gabapentin.  No chest pain or sob reported.  No abdominal pain.  Previous abdominal symptoms - better.  Bowels ok.  Discussed labs.    Past Medical History:  Diagnosis Date  . Arthritis   . Carotid arterial disease ([place])    a. 11/2017 Carotid U/S: RICA 9-48%, LICA 54-62%.   . Chronic fatigue   . Chronic leg pain   . DDD (degenerative disc disease), lumbar   . Dyspnea on exertion    a. 02/2017 Echo: EF 60-65%, no rwma, mild MR. Nl RV size/fxn. Nl PASP; b. 02/2017 MV: small, mild, fixed basal inferolateral defect - ? artifact vs scar. No ischemia. EF >65%.  . Hypercholesterolemia   . Hypertension   . Insomnia   . Palpitations    a. 02/2017 Holter: Avg HR 66 (51-115), rare PACs, four  brief runs of PAT - up to 5 beats, max rate 157. Rare isolated PVCs. No sustained arrhythmias; b. 03/2018 24h Holter: Rare PACs, 3 beat run PAT (122 bpm), PVCs (2% of beats).  . Radiculopathy    Lumbar region  . Sinus bradycardia   . Spondylolisthesis    lumbosacral region   Past Surgical History:  Procedure Laterality Date  . ABDOMINAL EXPOSURE N/A 11/10/2018   Procedure: ABDOMINAL EXPOSURE;  Surgeon: Angelia Mould, MD;  Location: [place];  Service: Vascular;  Laterality: N/A;  . ABDOMINAL HYSTERECTOMY  1975  . ANTERIOR CERVICAL DECOMP/DISCECTOMY FUSION N/A 04/09/2013   Procedure: ANTERIOR CERVICAL DECOMPRESSION/DISCECTOMY FUSION 2 LEVELS;  Surgeon: Floyce Stakes, MD;  Location: [facility];  Service: Neurosurgery;  Laterality: N/A;  Cervical five-six Cervical six-seven  Anterior cervical decompression/diskectomy/fusion  . ANTERIOR LUMBAR FUSION N/A 11/10/2018   Procedure: Anterior Lumbar Interbody Fusion-Lumbar five-Sacral one;  Surgeon: Earnie Larsson, MD;  Location: [place];  Service: Neurosurgery;  Laterality: N/A;  . BACK SURGERY     ruptured disc  . RECONSTRUCTION OF NOSE     Family History  Problem Relation Age of Onset  . Cancer Mother        ovarian/uterine  . Heart disease Mother   . Heart attack Mother   . Stroke Mother   .  Colon cancer Father   . Dementia Father   . COPD Brother   . Congestive Heart Failure Brother   . Stroke Daughter   . Heart attack Daughter   . Suicidality Brother   . Breast cancer Neg Hx    Social History   Socioeconomic History  . Marital status: Married    Spouse name: Not on file  . Number of children: 2  . Years of education: Not on file  . Highest education level: Not on file  Occupational History  . Not on file  Tobacco Use  . Smoking status: Never Smoker  . Smokeless tobacco: Never Used  Vaping Use  . Vaping Use: Never used  Substance and Sexual Activity  . Alcohol use: Yes    Alcohol/week: 0.0 standard drinks     Comment: rarely  . Drug use: No  . Sexual activity: Yes  Other Topics Concern  . Not on file  Social History Narrative  . Not on file   Social Determinants of Health   Financial Resource Strain:   . Difficulty of Paying Living Expenses: Not on file  Food Insecurity:   . Worried About Charity fundraiser in the Last Year: Not on file  . Ran Out of Food in the Last Year: Not on file  Transportation Needs:   . Lack of Transportation (Medical): Not on file  . Lack of Transportation (Non-Medical): Not on file  Physical Activity:   . Days of Exercise per Week: Not on file  . Minutes of Exercise per Session: Not on file  Stress:   . Feeling of Stress : Not on file  Social Connections:   . Frequency of Communication with Friends and Family: Not on file  . Frequency of Social Gatherings with Friends and Family: Not on file  . Attends Religious Services: Not on file  . Active Member of Clubs or Organizations: Not on file  . Attends Archivist Meetings: Not on file  . Marital Status: Not on file     Review of Systems  Constitutional: Negative for appetite change and unexpected weight change.  HENT: Negative for congestion and sinus pressure.   Respiratory: Negative for cough, chest tightness and shortness of breath.   Cardiovascular: Negative for chest pain and palpitations.       Swelling left foot.   Gastrointestinal: Negative for abdominal pain, diarrhea, nausea and vomiting.  Genitourinary: Negative for difficulty urinating and dysuria.  Musculoskeletal: Positive for back pain. Negative for joint swelling.  Skin: Negative for color change and rash.  Neurological: Negative for dizziness, light-headedness and headaches.  Psychiatric/Behavioral: Negative for agitation and dysphoric mood.       Objective:    Physical Exam Vitals reviewed.  Constitutional:      General: She is not in acute distress.    Appearance: Normal appearance.  HENT:     Head: Normocephalic  and atraumatic.     Right Ear: External ear normal.     Left Ear: External ear normal.  Eyes:     General: No scleral icterus.       Right eye: No discharge.        Left eye: No discharge.     Conjunctiva/sclera: Conjunctivae normal.  Neck:     Thyroid: No thyromegaly.  Cardiovascular:     Rate and Rhythm: Normal rate and regular rhythm.  Pulmonary:     Effort: No respiratory distress.     Breath sounds: Normal breath sounds. No wheezing.  Abdominal:     General: Bowel sounds are normal.     Palpations: Abdomen is soft.     Tenderness: There is no abdominal tenderness.  Musculoskeletal:        General: No swelling or tenderness.     Cervical back: Neck supple. No tenderness.  Lymphadenopathy:     Cervical: No cervical adenopathy.  Skin:    Findings: No erythema or rash.  Neurological:     Mental Status: She is alert.  Psychiatric:        Mood and Affect: Mood normal.        Behavior: Behavior normal.     BP 120/70   Pulse 74   Temp 98.2 F (36.8 C) (Oral)   Resp 16   Ht '5\' 4"'  (1.626 m)   Wt 139 lb (63 kg)   SpO2 98%   BMI 23.86 kg/m  Wt Readings from Last 3 Encounters:  08/08/20 139 lb (63 kg)  07/27/20 142 lb (64.4 kg)  05/24/20 138 lb (62.6 kg)     Lab Results  Component Value Date   WBC 6.8 08/03/2020   HGB 12.0 08/03/2020   HCT 36.3 08/03/2020   PLT 402.0 (H) 08/03/2020   GLUCOSE 89 08/03/2020   CHOL 148 08/03/2020   TRIG 91.0 08/03/2020   HDL 48.20 08/03/2020   LDLDIRECT 151.7 08/16/2013   LDLCALC 82 08/03/2020   ALT 33 08/03/2020   AST 15 08/03/2020   NA 140 08/03/2020   K 3.5 08/03/2020   CL 103 08/03/2020   CREATININE 0.78 08/03/2020   BUN 18 08/03/2020   CO2 29 08/03/2020   TSH 3.62 08/03/2020   HGBA1C 6.4 08/03/2020    US Venous Img Lower Unilateral Left (DVT)  Result Date: 07/28/2020 CLINICAL DATA:  74 year old female with left leg swelling EXAM: LEFT LOWER EXTREMITY VENOUS DOPPLER ULTRASOUND TECHNIQUE: Gray-scale sonography with  graded compression, as well as color Doppler and duplex ultrasound were performed to evaluate the lower extremity deep venous systems from the level of the common femoral vein and including the common femoral, femoral, profunda femoral, popliteal and calf veins including the posterior tibial, peroneal and gastrocnemius veins when visible. The superficial great saphenous vein was also interrogated. Spectral Doppler was utilized to evaluate flow at rest and with distal augmentation maneuvers in the common femoral, femoral and popliteal veins. COMPARISON:  None. FINDINGS: Contralateral Common Femoral Vein: Respiratory phasicity is normal and symmetric with the symptomatic side. No evidence of thrombus. Normal compressibility. Common Femoral Vein: No evidence of thrombus. Normal compressibility, respiratory phasicity and response to augmentation. Saphenofemoral Junction: No evidence of thrombus. Normal compressibility and flow on color Doppler imaging. Profunda Femoral Vein: No evidence of thrombus. Normal compressibility and flow on color Doppler imaging. Femoral Vein: No evidence of thrombus. Normal compressibility, respiratory phasicity and response to augmentation. Popliteal Vein: No evidence of thrombus. Normal compressibility, respiratory phasicity and response to augmentation. Calf Veins: No evidence of thrombus. Normal compressibility and flow on color Doppler imaging. Superficial Great Saphenous Vein: No evidence of thrombus. Normal compressibility and flow on color Doppler imaging. Other Findings: Lentiform anechoic fluid collection in the popliteal region measures 2.0 cm x 1.2 cm x 0.5 cm IMPRESSION: Sonographic survey of the left lower extremity negative for DVT Small Baker's cyst. Electronically Signed   By: Corrie Mckusick D.O.   On: 07/28/2020 12:07       Assessment & Plan:   Problem List Items Addressed This Visit    Stress  Increased stress.  Discussed.  Overall appears to be handling things  well. Follow.       Left foot pain    Numbness and tingling - left foot.  Some swelling.  Back pain.  Prednisone helped.  Due to f/u with NSU soon.        Hypertension    Blood pressure controlled on losartan/hctz and amlodipine.  Follow pressures.  Follow metabolic panel.       Relevant Orders   CBC with Differential/Platelet   Basic metabolic panel   Hyperlipidemia LDL goal <70    On crestor. Low cholesterol diet and exercise. Follow lipid panel and liver function tests.        Relevant Orders   Hepatic function panel   Lipid panel   Hyperglycemia    Low carb diet and exercise.  Follow met b and a1c.       Relevant Orders   Hemoglobin A1c   Back pain    Increased pain as outlined.  Prednisone helped.  Due to see NSU in the next couple of days.            Einar Pheasant, MD

## 2020-08-10 DIAGNOSIS — Z981 Arthrodesis status: Secondary | ICD-10-CM | POA: Diagnosis not present

## 2020-08-10 DIAGNOSIS — M5416 Radiculopathy, lumbar region: Secondary | ICD-10-CM | POA: Diagnosis not present

## 2020-08-10 DIAGNOSIS — I10 Essential (primary) hypertension: Secondary | ICD-10-CM | POA: Diagnosis not present

## 2020-08-14 ENCOUNTER — Encounter: Payer: Self-pay | Admitting: Internal Medicine

## 2020-08-14 ENCOUNTER — Telehealth: Payer: Self-pay | Admitting: Internal Medicine

## 2020-08-14 DIAGNOSIS — M79672 Pain in left foot: Secondary | ICD-10-CM

## 2020-08-14 DIAGNOSIS — M25569 Pain in unspecified knee: Secondary | ICD-10-CM

## 2020-08-14 NOTE — Telephone Encounter (Signed)
Patient called back with name; Kristen Strickland at [facility] [place].

## 2020-08-14 NOTE — Assessment & Plan Note (Signed)
Numbness and tingling - left foot.  Some swelling.  Back pain.  Prednisone helped.  Due to f/u with NSU soon.

## 2020-08-14 NOTE — Assessment & Plan Note (Signed)
Increased pain as outlined.  Prednisone helped.  Due to see NSU in the next couple of days.

## 2020-08-14 NOTE — Assessment & Plan Note (Signed)
Increased stress.  Discussed.  Overall appears to be handling things well. Follow.

## 2020-08-14 NOTE — Assessment & Plan Note (Signed)
Blood pressure controlled on losartan/hctz and amlodipine.  Follow pressures.  Follow metabolic panel.

## 2020-08-14 NOTE — Assessment & Plan Note (Signed)
On crestor.  Low cholesterol diet and exercise.  Follow lipid panel and liver function tests.   

## 2020-08-14 NOTE — Telephone Encounter (Signed)
Pt daughter called and wanted to get her mom referred to [facility] rheumatology

## 2020-08-14 NOTE — Telephone Encounter (Signed)
Want to see Rheumatologist due to her left knee pain, and saw PCP last Thursday, and wants to see if rheumatology will call back with her preference of rheumatologist.

## 2020-08-14 NOTE — Assessment & Plan Note (Signed)
Low carb diet and exercise.  Follow met b and a1c.  

## 2020-08-15 NOTE — Telephone Encounter (Signed)
Order placed for referral.  

## 2020-08-15 NOTE — Addendum Note (Signed)
Addended by: Alisa Graff on: 08/15/2020 05:21 AM   Modules accepted: Orders

## 2020-08-16 ENCOUNTER — Ambulatory Visit: Payer: Medicare Other | Admitting: Internal Medicine

## 2020-08-24 DIAGNOSIS — M5416 Radiculopathy, lumbar region: Secondary | ICD-10-CM | POA: Diagnosis not present

## 2020-08-30 ENCOUNTER — Encounter: Payer: Self-pay | Admitting: Internal Medicine

## 2020-08-30 ENCOUNTER — Other Ambulatory Visit: Payer: Self-pay

## 2020-08-30 ENCOUNTER — Ambulatory Visit (INDEPENDENT_AMBULATORY_CARE_PROVIDER_SITE_OTHER): Payer: Medicare Other | Admitting: Internal Medicine

## 2020-08-30 VITALS — BP 140/70 | HR 63 | Ht 64.0 in | Wt 138.0 lb

## 2020-08-30 DIAGNOSIS — I6523 Occlusion and stenosis of bilateral carotid arteries: Secondary | ICD-10-CM

## 2020-08-30 DIAGNOSIS — M7989 Other specified soft tissue disorders: Secondary | ICD-10-CM

## 2020-08-30 DIAGNOSIS — R0600 Dyspnea, unspecified: Secondary | ICD-10-CM

## 2020-08-30 DIAGNOSIS — I10 Essential (primary) hypertension: Secondary | ICD-10-CM | POA: Diagnosis not present

## 2020-08-30 DIAGNOSIS — E785 Hyperlipidemia, unspecified: Secondary | ICD-10-CM

## 2020-08-30 DIAGNOSIS — R0602 Shortness of breath: Secondary | ICD-10-CM

## 2020-08-30 DIAGNOSIS — I159 Secondary hypertension, unspecified: Secondary | ICD-10-CM

## 2020-08-30 NOTE — Patient Instructions (Signed)
Medication Instructions:   Your physician recommends that you continue on your current medications as directed. Please refer to the Current Medication list given to you today.  *If you need a refill on your cardiac medications before your next appointment, please call your pharmacy*   Lab Work: None ordered.  If you have labs (blood work) drawn today and your tests are completely normal, you will receive your results only by: Marland Kitchen MyChart Message (if you have MyChart) OR . A paper copy in the mail If you have any lab test that is abnormal or we need to change your treatment, we will call you to review the results.   Testing/Procedures: None ordered.   Follow-Up: At [facility], you and your health needs are our priority.  As part of our continuing mission to provide you with exceptional heart care, we have created designated Provider Care Teams.  These Care Teams include your primary Cardiologist (physician) and Advanced Practice Providers (APPs -  Physician Assistants and Nurse Practitioners) who all work together to provide you with the care you need, when you need it.  We recommend signing up for the patient portal called "MyChart".  Sign up information is provided on this After Visit Summary.  MyChart is used to connect with patients for Virtual Visits (Telemedicine).  Patients are able to view lab/test results, encounter notes, upcoming appointments, etc.  Non-urgent messages can be sent to your provider as well.   To learn more about what you can do with MyChart, go to [email or web address].    Your next appointment:   1 year(s)  The format for your next appointment:   In Person  Provider:   You may see Nelva Bush, MD or one of the following Advanced Practice Providers on your designated Care Team:    Murray Hodgkins, NP  Christell Faith, PA-C  Marrianne Mood, PA-C  Cadence Cornish, [place]

## 2020-08-30 NOTE — Progress Notes (Signed)
Follow-up Outpatient Visit Date: 08/30/2020  Primary Care Provider: Einar Pheasant, [place] [address] [id number]  Chief Complaint: Follow-up shortness of breath  HPI:  Kristen Strickland is a 74 y.o. female with history of hypertension, hyperlipidemia, carotid artery stenosis, and degenerative disc disease, who presents for follow-up of shortness of breath.  I last saw Kristen Strickland a year ago, at which time she noted stable exertional dyspnea when walking up an incline, as well as lightheadedness.  She continued to have pain in both legs, which she attributed to prior back surgery.  We did not make any medication changes or pursue additional testing at that time.  Today, Kristen Strickland reports that she has been feeling relatively well.  She has stable exertional dyspnea.  She denies chest pain.  She has occasional flutters when she lies down at night, usually lasting a second or 2.  There are no associated symptoms.  She notes recent left leg swelling that prompted urgent care visit.  A lower extremity venous duplex was negative for DVT.  She is concerned that it may be related to her back problems and is scheduled to see her neurosurgeon, Dr. Trenton Gammon, tomorrow.  She does not have orthopnea or PND.  --------------------------------------------------------------------------------------------------  Past Medical History:  Diagnosis Date  . Arthritis   . Carotid arterial disease ([place])    a. 11/2017 Carotid U/S: RICA 8-56%, LICA 31-49%.   . Chronic fatigue   . Chronic leg pain   . DDD (degenerative disc disease), lumbar   . Dyspnea on exertion    a. 02/2017 Echo: EF 60-65%, no rwma, mild MR. Nl RV size/fxn. Nl PASP; b. 02/2017 MV: small, mild, fixed basal inferolateral defect - ? artifact vs scar. No ischemia. EF >65%.  . Hypercholesterolemia   . Hypertension   . Insomnia   . Palpitations    a. 02/2017 Holter: Avg HR 66 (51-115), rare PACs, four brief runs of PAT - up to 5  beats, max rate 157. Rare isolated PVCs. No sustained arrhythmias; b. 03/2018 24h Holter: Rare PACs, 3 beat run PAT (122 bpm), PVCs (2% of beats).  . Radiculopathy    Lumbar region  . Sinus bradycardia   . Spondylolisthesis    lumbosacral region   Past Surgical History:  Procedure Laterality Date  . ABDOMINAL EXPOSURE N/A 11/10/2018   Procedure: ABDOMINAL EXPOSURE;  Surgeon: Angelia Mould, MD;  Location: [place];  Service: Vascular;  Laterality: N/A;  . ABDOMINAL HYSTERECTOMY  1975  . ANTERIOR CERVICAL DECOMP/DISCECTOMY FUSION N/A 04/09/2013   Procedure: ANTERIOR CERVICAL DECOMPRESSION/DISCECTOMY FUSION 2 LEVELS;  Surgeon: Floyce Stakes, MD;  Location: [facility];  Service: Neurosurgery;  Laterality: N/A;  Cervical five-six Cervical six-seven  Anterior cervical decompression/diskectomy/fusion  . ANTERIOR LUMBAR FUSION N/A 11/10/2018   Procedure: Anterior Lumbar Interbody Fusion-Lumbar five-Sacral one;  Surgeon: Earnie Larsson, MD;  Location: [place];  Service: Neurosurgery;  Laterality: N/A;  . BACK SURGERY     ruptured disc  . RECONSTRUCTION OF NOSE      Current Meds  Medication Sig  . amLODipine (NORVASC) 5 MG tablet TAKE 1 TABLET BY MOUTH ONCE DAILY.  Marland Kitchen aspirin EC 81 MG tablet Take 81 mg by mouth daily.  Marland Kitchen gabapentin (NEURONTIN) 100 MG capsule Take 100 mg by mouth 3 (three) times daily.  Marland Kitchen losartan-hydrochlorothiazide (HYZAAR) 100-25 MG tablet TAKE 1 TABLET BY MOUTH ONCE DAILY.  . Multiple Vitamins-Minerals (CENTRUM PO) Take 1 tablet by mouth daily.   Marland Kitchen  omeprazole (PRILOSEC) 20 MG capsule Take 1 capsule (20 mg total) by mouth daily.  . rosuvastatin (CRESTOR) 20 MG tablet TAKE 1 TABLET BY MOUTH ONCE DAILY FOR CHOLESTEROL  . traMADol (ULTRAM) 50 MG tablet Take 50 mg by mouth every 6 (six) hours as needed.  . valACYclovir (VALTREX) 1000 MG tablet Take 1 tablet (1,000 mg total) by mouth 3 (three) times daily.    Allergies: Patient has no known allergies.  Social History    Tobacco Use  . Smoking status: Never Smoker  . Smokeless tobacco: Never Used  Vaping Use  . Vaping Use: Never used  Substance Use Topics  . Alcohol use: Not Currently    Alcohol/week: 0.0 standard drinks    Comment: rarely  . Drug use: No    Family History  Problem Relation Age of Onset  . Cancer Mother        ovarian/uterine  . Heart disease Mother   . Heart attack Mother   . Stroke Mother   . Colon cancer Father   . Dementia Father   . COPD Brother   . Congestive Heart Failure Brother   . Stroke Daughter   . Heart attack Daughter   . Suicidality Brother   . Breast cancer Neg Hx     Review of Systems: A 12-system review of systems was performed and was negative except as noted in the HPI.  --------------------------------------------------------------------------------------------------  Physical Exam: BP 140/70 (BP Location: Left Arm, Patient Position: Sitting, Cuff Size: Normal)   Pulse 63   Ht 5\' 4"  (1.626 m)   Wt 138 lb (62.6 kg)   SpO2 93%   BMI 23.69 kg/m   General: NAD. Neck: No JVD or HJR. Lungs: Mildly diminished breath sounds throughout without wheezes or crackles. Heart: Regular rate and rhythm with 1/6 systolic murmur.  No rubs or gallops. Abdomen: Soft, nontender, nondistended. Extremities: 1+ left calf/ankle edema.  EKG: Normal sinus rhythm with nonspecific ST changes.  Compared with prior tracing from 08/13/2019, heart rate has increased slightly.  Otherwise, there has been no significant interval change.  Lab Results  Component Value Date   WBC 6.8 08/03/2020   HGB 12.0 08/03/2020   HCT 36.3 08/03/2020   MCV 87.8 08/03/2020   PLT 402.0 (H) 08/03/2020    Lab Results  Component Value Date   NA 140 08/03/2020   K 3.5 08/03/2020   CL 103 08/03/2020   CO2 29 08/03/2020   BUN 18 08/03/2020   CREATININE 0.78 08/03/2020   GLUCOSE 89 08/03/2020   ALT 33 08/03/2020    Lab Results  Component Value Date   CHOL 148 08/03/2020   HDL  48.20 08/03/2020   LDLCALC 82 08/03/2020   LDLDIRECT 151.7 08/16/2013   TRIG 91.0 08/03/2020   CHOLHDL 3 08/03/2020    --------------------------------------------------------------------------------------------------  ASSESSMENT AND PLAN: Chronic shortness of breath: Overall, this is stable, with prior echo and myocardial perfusion stress test in 2008 demonstrating normal LVEF with mild mitral regurgitation and normal myocardial perfusion.  We will defer additional testing at this time.  Left leg swelling: Notable and its unilateral nature.  Recent lower extremity venous duplex was negative for DVT.  Ms. Siebel is concerned that this is neuropathic in nature, which seems somewhat unusual.  She is scheduled to see her neurosurgeon tomorrow; we will defer further intervention at this time.  If swelling persists, it may be worthwhile to decrease/stop amlodipine to see if this offers any improvement.  Hypertension: Blood pressure mildly elevated today  but typically better.  We have agreed to defer medication changes today.  As noted above, if edema persists, de-escalation/discontinuation of amlodipine may need to be considered.  Carotid artery stenosis: Moderate disease noted on most recent carotid Dopplers.  Ongoing follow-up per vascular surgery.  Hyperlipidemia: Continue rosuvastatin 20 mg daily.  Follow-up: Return to clinic in 1 year.  Nelva Bush, MD 08/31/2020 9:51 AM

## 2020-08-31 ENCOUNTER — Encounter: Payer: Self-pay | Admitting: Internal Medicine

## 2020-08-31 DIAGNOSIS — M5416 Radiculopathy, lumbar region: Secondary | ICD-10-CM | POA: Diagnosis not present

## 2020-08-31 DIAGNOSIS — M5412 Radiculopathy, cervical region: Secondary | ICD-10-CM | POA: Diagnosis not present

## 2020-08-31 DIAGNOSIS — Z6822 Body mass index (BMI) 22.0-22.9, adult: Secondary | ICD-10-CM | POA: Insufficient documentation

## 2020-09-08 ENCOUNTER — Telehealth: Payer: Self-pay | Admitting: Internal Medicine

## 2020-09-08 NOTE — Telephone Encounter (Signed)
It looks like a bakers cyst was noted on her scan but I did not see anything else abnormal

## 2020-09-08 NOTE — Telephone Encounter (Signed)
Patient had a ultra sound on 10/07/2021on her knee, no blood clot found but tech said there is something behind her knee and would like to know what it was. She is still having problems with her knee.

## 2020-09-08 NOTE — Telephone Encounter (Signed)
LMTCB

## 2020-09-08 NOTE — Telephone Encounter (Signed)
Pt called returning your call 

## 2020-09-08 NOTE — Telephone Encounter (Signed)
Notify pt that in reviewing her ultrasound, there does appear to be a small baker's cyst.

## 2020-09-11 NOTE — Telephone Encounter (Signed)
Pt returned call for results.

## 2020-09-11 NOTE — Telephone Encounter (Signed)
Patient would like to try PT with Dr Trenton Gammon and then will let me know if she decides to see ortho

## 2020-09-11 NOTE — Telephone Encounter (Signed)
LMTCB

## 2020-09-12 DIAGNOSIS — M545 Low back pain, unspecified: Secondary | ICD-10-CM | POA: Diagnosis not present

## 2020-09-12 DIAGNOSIS — R262 Difficulty in walking, not elsewhere classified: Secondary | ICD-10-CM | POA: Diagnosis not present

## 2020-09-12 DIAGNOSIS — M79605 Pain in left leg: Secondary | ICD-10-CM | POA: Diagnosis not present

## 2020-09-13 IMAGING — MG DIGITAL SCREENING BILAT W/ TOMO W/ CAD
8 series · 8 of 24 positions shown · non-contrast
Comparison: Previous exam(s).

CLINICAL DATA: Screening.

EXAM:
DIGITAL SCREENING BILATERAL MAMMOGRAM WITH TOMO AND CAD

[R MLO synth-2D]
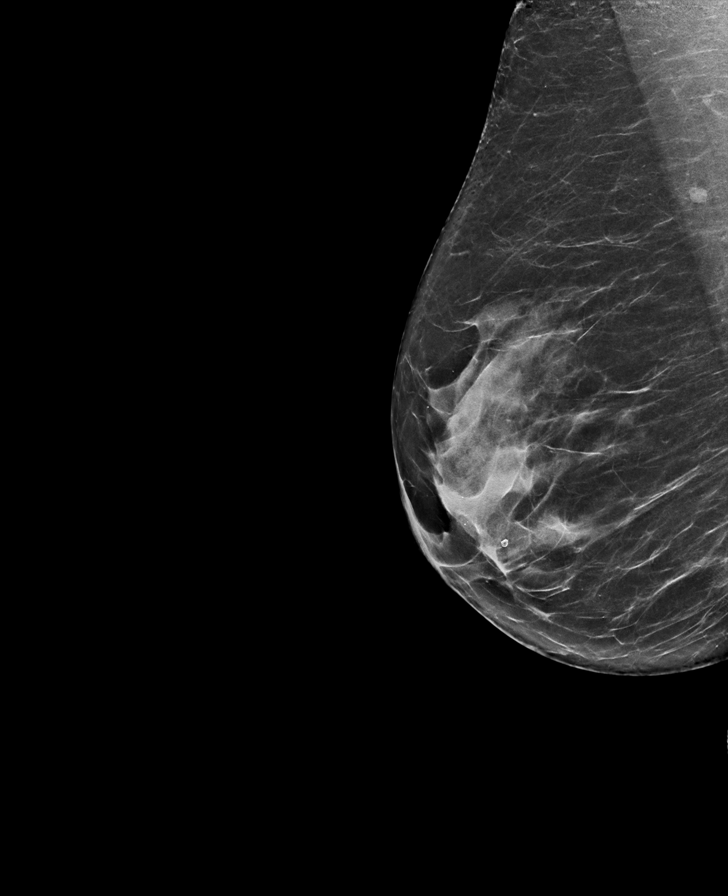

[R CC synth-2D]
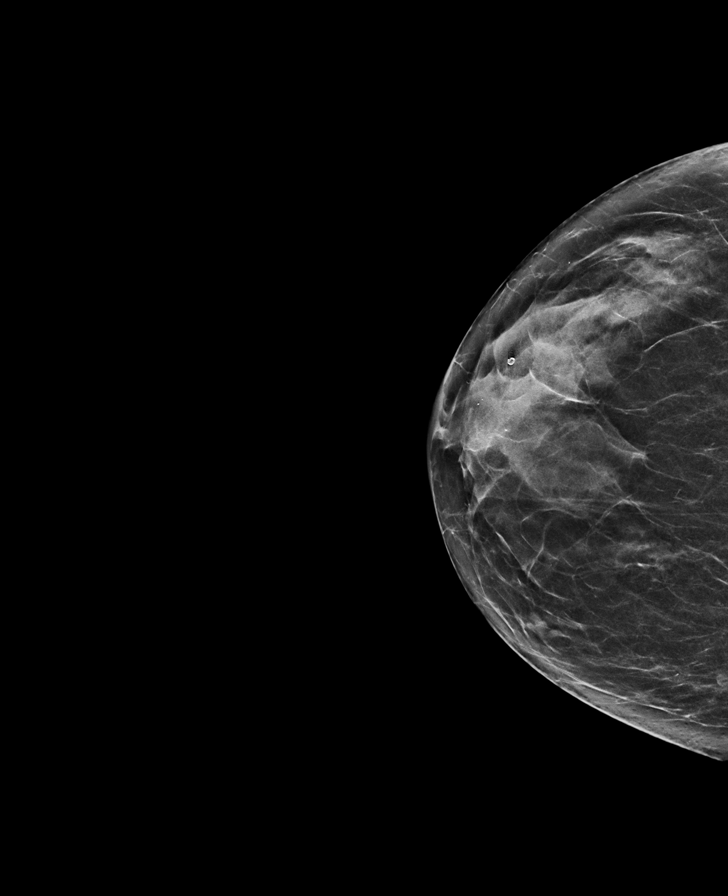

[L CC synth-2D]
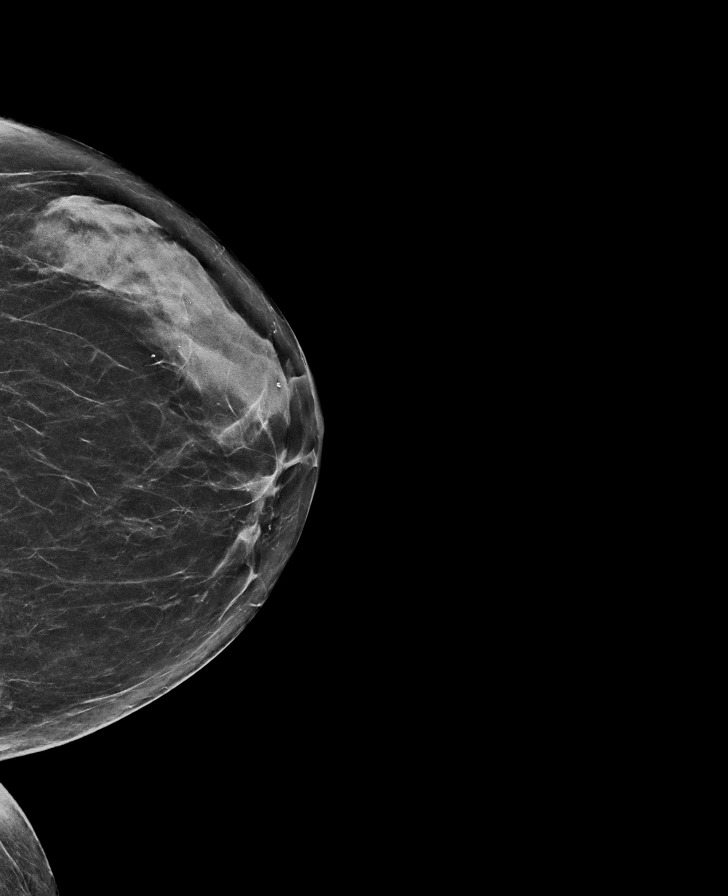

[L MLO synth-2D]
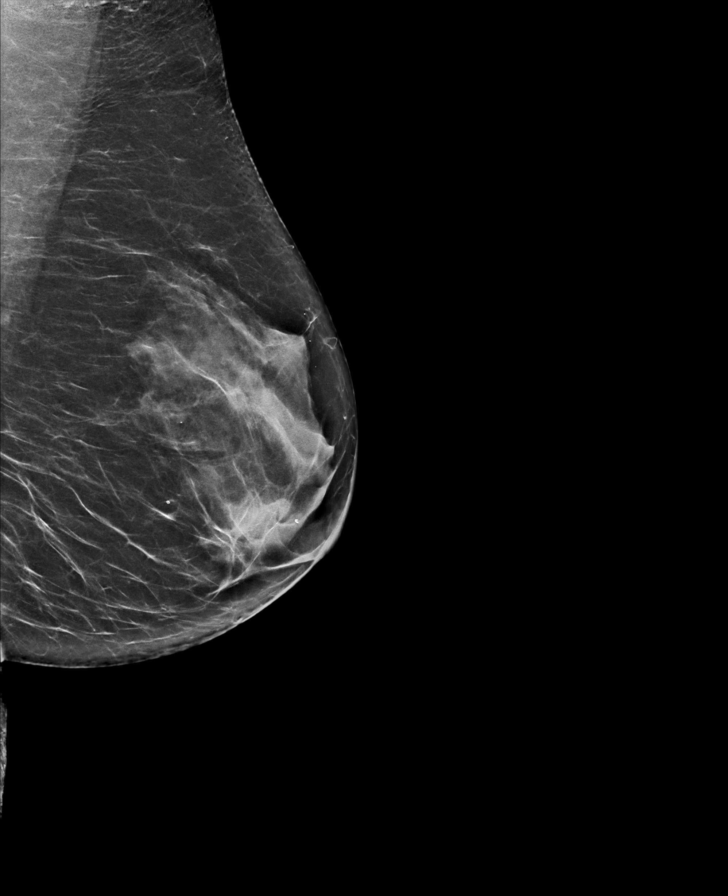

[L CC tomo · tomo slice 31/60.0]
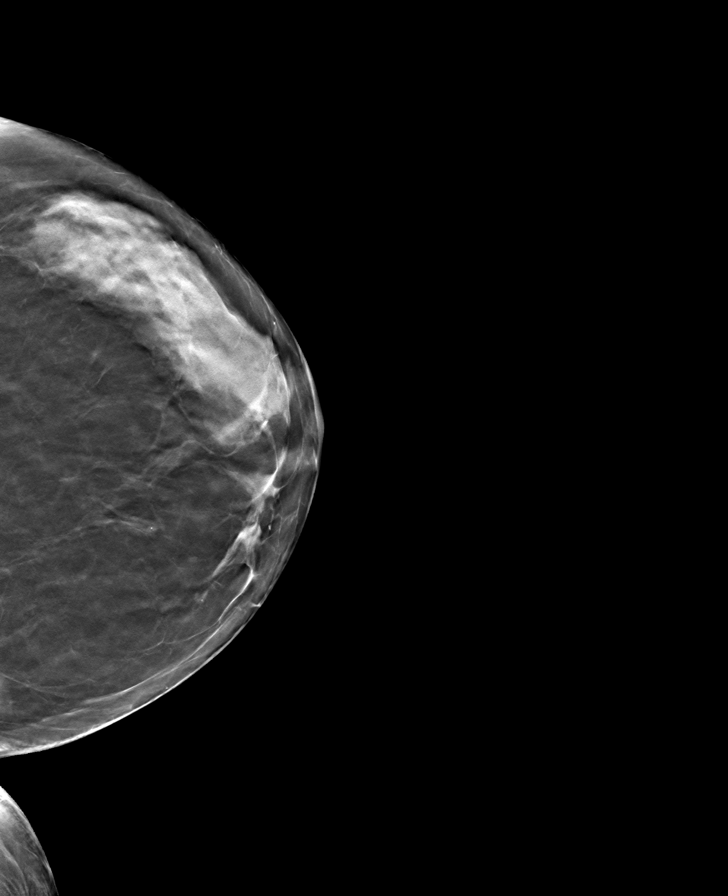

[R CC tomo · tomo slice 29/57.0]
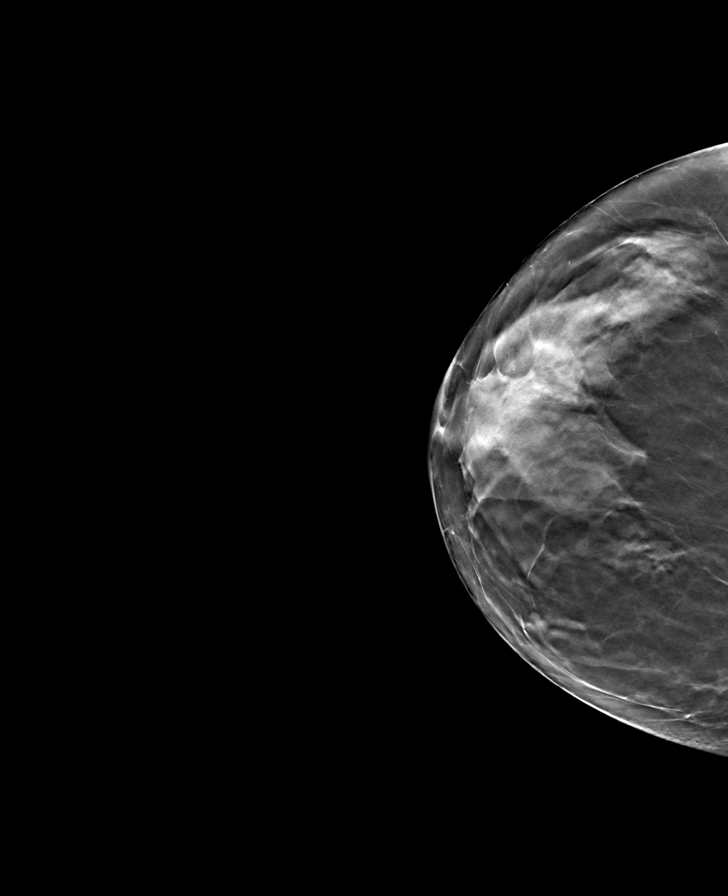

[R MLO tomo · tomo slice 35/68.0]
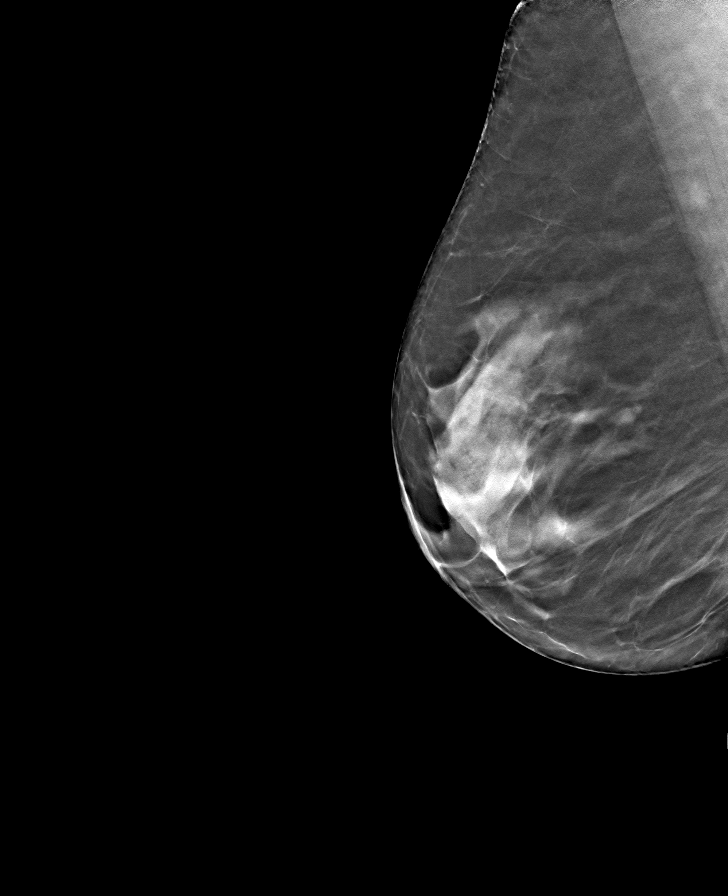

[L MLO tomo · tomo slice 35/70.0]
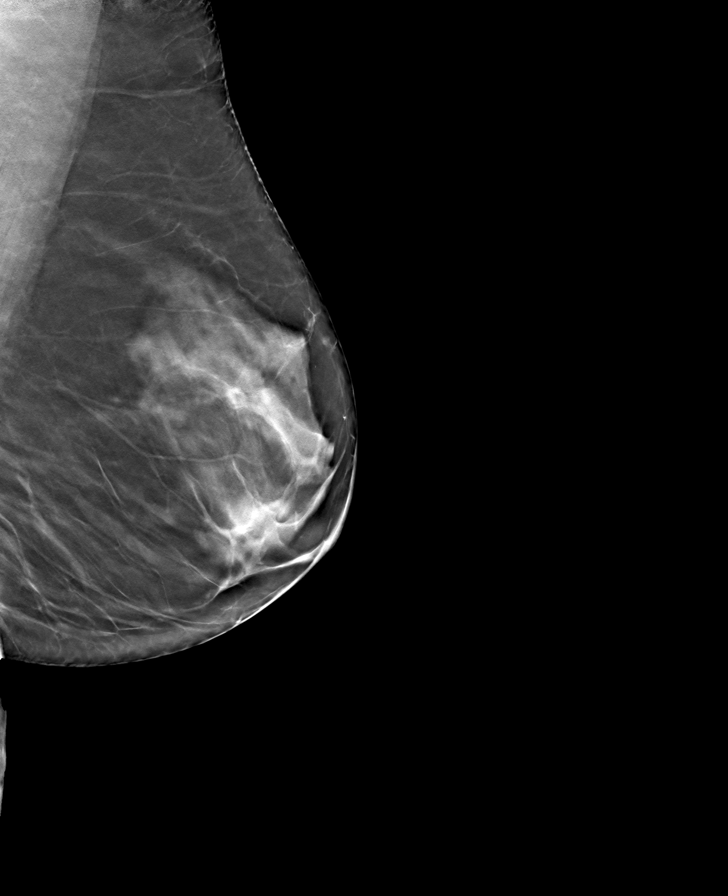

[8 of 24 positions shown; findings below may reference images not displayed]

ACR Breast Density Category c: The breast tissue is heterogeneously
dense, which may obscure small masses.
FINDINGS: There are no findings suspicious for malignancy. Images were
processed with CAD.
IMPRESSION: No mammographic evidence of malignancy. A result letter of this
screening mammogram will be mailed directly to the patient.

RECOMMENDATION:
Screening mammogram in one year. (Code:FT-U-LHB)

BI-RADS CATEGORY  1: Negative.

## 2020-09-15 DIAGNOSIS — M545 Low back pain, unspecified: Secondary | ICD-10-CM | POA: Diagnosis not present

## 2020-09-15 DIAGNOSIS — R262 Difficulty in walking, not elsewhere classified: Secondary | ICD-10-CM | POA: Diagnosis not present

## 2020-09-15 DIAGNOSIS — M79605 Pain in left leg: Secondary | ICD-10-CM | POA: Diagnosis not present

## 2020-09-19 DIAGNOSIS — M79605 Pain in left leg: Secondary | ICD-10-CM | POA: Diagnosis not present

## 2020-09-19 DIAGNOSIS — R262 Difficulty in walking, not elsewhere classified: Secondary | ICD-10-CM | POA: Diagnosis not present

## 2020-09-19 DIAGNOSIS — M545 Low back pain, unspecified: Secondary | ICD-10-CM | POA: Diagnosis not present

## 2020-09-21 DIAGNOSIS — M545 Low back pain, unspecified: Secondary | ICD-10-CM | POA: Diagnosis not present

## 2020-09-21 DIAGNOSIS — R262 Difficulty in walking, not elsewhere classified: Secondary | ICD-10-CM | POA: Diagnosis not present

## 2020-09-21 DIAGNOSIS — M79605 Pain in left leg: Secondary | ICD-10-CM | POA: Diagnosis not present

## 2020-09-26 DIAGNOSIS — M545 Low back pain, unspecified: Secondary | ICD-10-CM | POA: Diagnosis not present

## 2020-09-26 DIAGNOSIS — R262 Difficulty in walking, not elsewhere classified: Secondary | ICD-10-CM | POA: Diagnosis not present

## 2020-09-26 DIAGNOSIS — M79605 Pain in left leg: Secondary | ICD-10-CM | POA: Diagnosis not present

## 2020-09-27 DIAGNOSIS — M79605 Pain in left leg: Secondary | ICD-10-CM | POA: Diagnosis not present

## 2020-09-27 DIAGNOSIS — M545 Low back pain, unspecified: Secondary | ICD-10-CM | POA: Diagnosis not present

## 2020-09-27 DIAGNOSIS — R262 Difficulty in walking, not elsewhere classified: Secondary | ICD-10-CM | POA: Diagnosis not present

## 2020-10-03 DIAGNOSIS — M79605 Pain in left leg: Secondary | ICD-10-CM | POA: Diagnosis not present

## 2020-10-03 DIAGNOSIS — R262 Difficulty in walking, not elsewhere classified: Secondary | ICD-10-CM | POA: Diagnosis not present

## 2020-10-03 DIAGNOSIS — M545 Low back pain, unspecified: Secondary | ICD-10-CM | POA: Diagnosis not present

## 2020-10-05 DIAGNOSIS — M545 Low back pain, unspecified: Secondary | ICD-10-CM | POA: Diagnosis not present

## 2020-10-05 DIAGNOSIS — R262 Difficulty in walking, not elsewhere classified: Secondary | ICD-10-CM | POA: Diagnosis not present

## 2020-10-05 DIAGNOSIS — M79605 Pain in left leg: Secondary | ICD-10-CM | POA: Diagnosis not present

## 2020-10-10 DIAGNOSIS — M79605 Pain in left leg: Secondary | ICD-10-CM | POA: Diagnosis not present

## 2020-10-10 DIAGNOSIS — R262 Difficulty in walking, not elsewhere classified: Secondary | ICD-10-CM | POA: Diagnosis not present

## 2020-10-10 DIAGNOSIS — M545 Low back pain, unspecified: Secondary | ICD-10-CM | POA: Diagnosis not present

## 2020-10-12 DIAGNOSIS — M545 Low back pain, unspecified: Secondary | ICD-10-CM | POA: Diagnosis not present

## 2020-10-12 DIAGNOSIS — R262 Difficulty in walking, not elsewhere classified: Secondary | ICD-10-CM | POA: Diagnosis not present

## 2020-10-12 DIAGNOSIS — M79605 Pain in left leg: Secondary | ICD-10-CM | POA: Diagnosis not present

## 2020-10-19 DIAGNOSIS — R262 Difficulty in walking, not elsewhere classified: Secondary | ICD-10-CM | POA: Diagnosis not present

## 2020-10-19 DIAGNOSIS — M79605 Pain in left leg: Secondary | ICD-10-CM | POA: Diagnosis not present

## 2020-10-19 DIAGNOSIS — M545 Low back pain, unspecified: Secondary | ICD-10-CM | POA: Diagnosis not present

## 2020-10-24 DIAGNOSIS — M79605 Pain in left leg: Secondary | ICD-10-CM | POA: Diagnosis not present

## 2020-10-24 DIAGNOSIS — M545 Low back pain, unspecified: Secondary | ICD-10-CM | POA: Diagnosis not present

## 2020-10-24 DIAGNOSIS — R262 Difficulty in walking, not elsewhere classified: Secondary | ICD-10-CM | POA: Diagnosis not present

## 2020-10-26 DIAGNOSIS — R262 Difficulty in walking, not elsewhere classified: Secondary | ICD-10-CM | POA: Diagnosis not present

## 2020-10-26 DIAGNOSIS — M79605 Pain in left leg: Secondary | ICD-10-CM | POA: Diagnosis not present

## 2020-10-26 DIAGNOSIS — M545 Low back pain, unspecified: Secondary | ICD-10-CM | POA: Diagnosis not present

## 2020-11-01 IMAGING — MR MR LUMBAR SPINE WO/W CM
4 of 7 series · 22 of 48 positions shown · IV contrast (13ml multihance)
Comparison: Radiography 11/10/2018.  Myelogram 09/26/2015.

CLINICAL DATA: Chronic and persistent left low back pain extending
to the left leg with numbness.

Creatinine was obtained on site at [HOSPITAL] at [HOSPITAL].
Results: Creatinine 0.8 mg/dL.
EXAM:
MRI LUMBAR SPINE WITHOUT AND WITH CONTRAST
TECHNIQUE: Multiplanar and multiecho pulse sequences of the lumbar spine were
obtained without and with intravenous contrast.
CONTRAST:  13mL MULTIHANCE GADOBENATE DIMEGLUMINE 529 MG/ML IV SOLN

[Series 5: T1 · sagittal · 4.0mm · 0.73mm/px · 3 of 13 slices shown (1 of 2)]
[im 1/13]
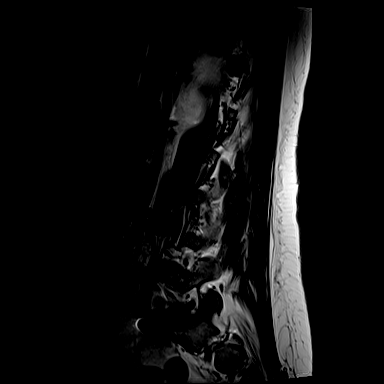
[im 7/13]
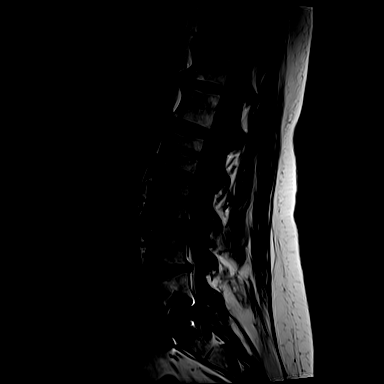
[im 13/13]
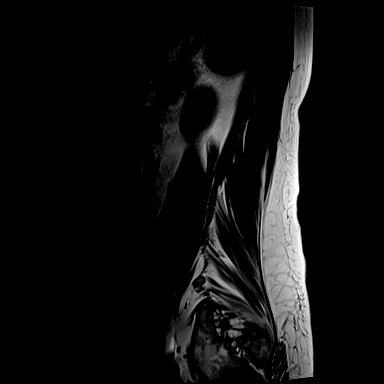

[Series 9: T1 · axial · 4.0mm · 0.35mm/px · z∈[-85,+117]mm · 4 of 39 slices shown (2 of 2)]
[im 1/39]
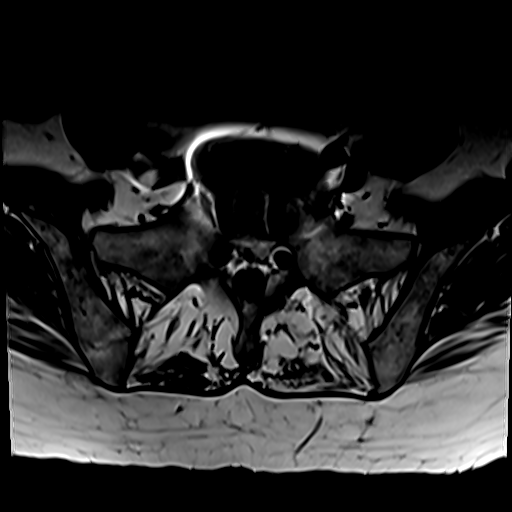
[im 4/39]
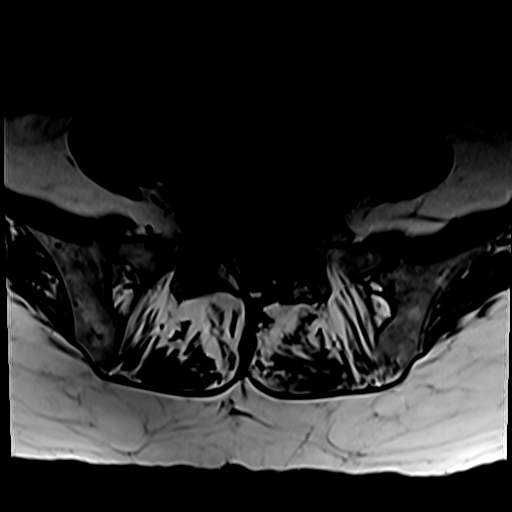
[im 20/39]
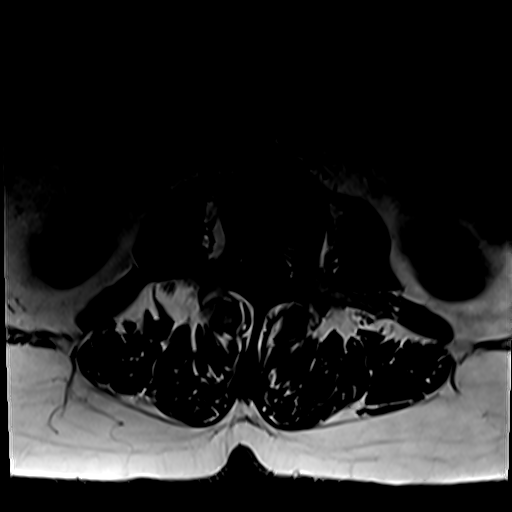
[im 35/39]
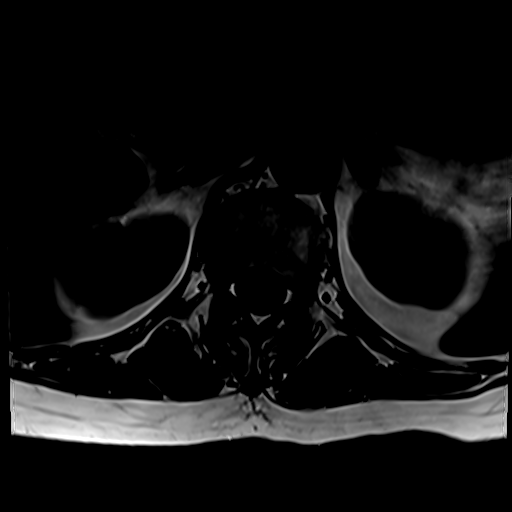

[Series 12: T2 · axial · 4.0mm · 0.35mm/px · z∈[-85,+136]mm · 11 of 39 slices shown (1 of 2)]
[im 1/39]
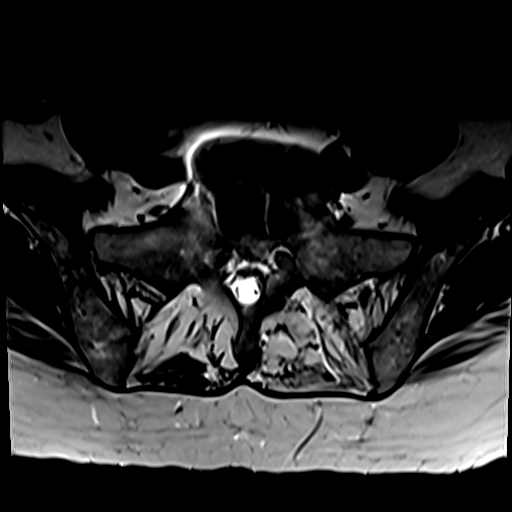
[im 4/39]
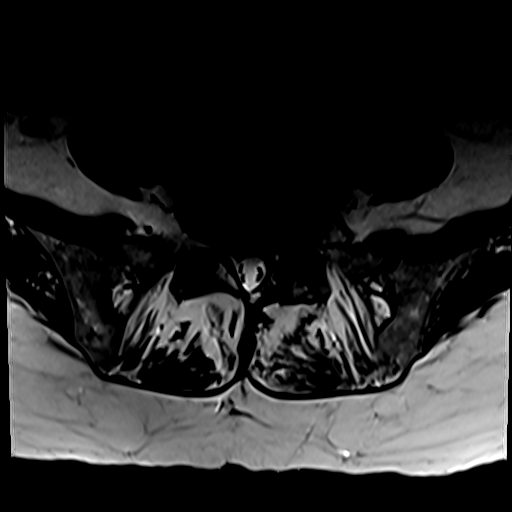
[im 8/39]
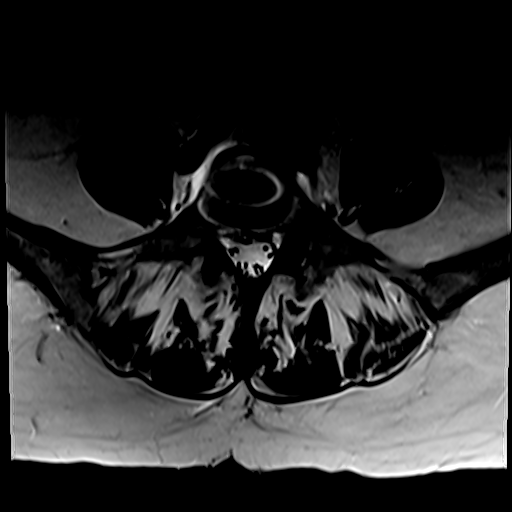
[im 12/39]
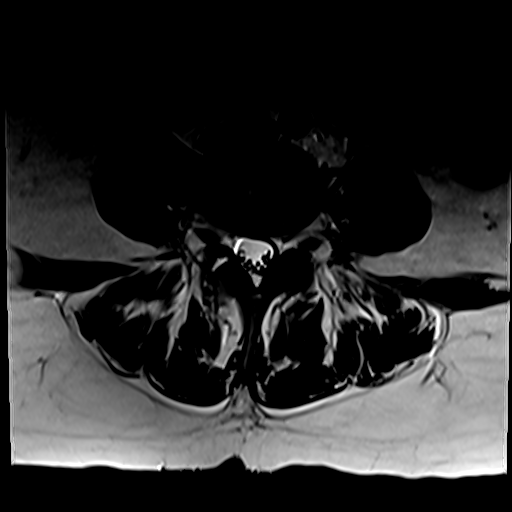
[im 16/39]
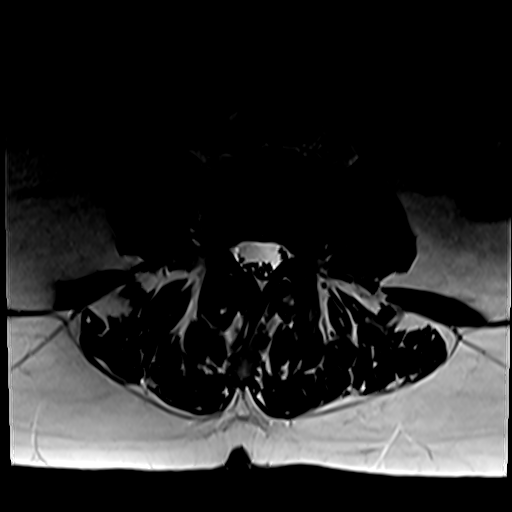
[im 20/39]
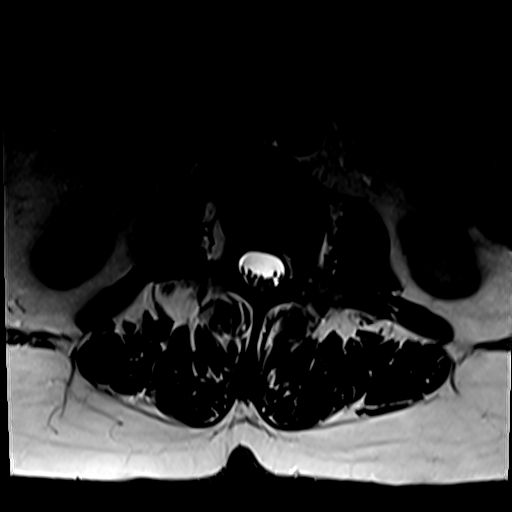
[im 23/39]
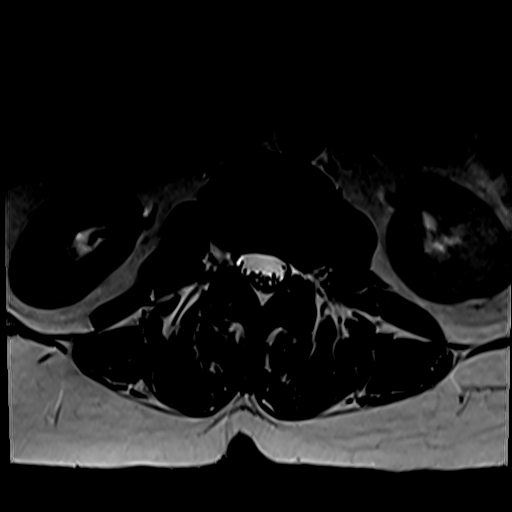
[im 27/39]
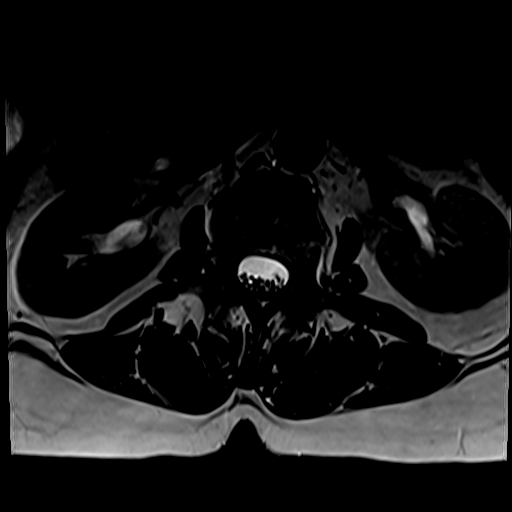
[im 31/39]
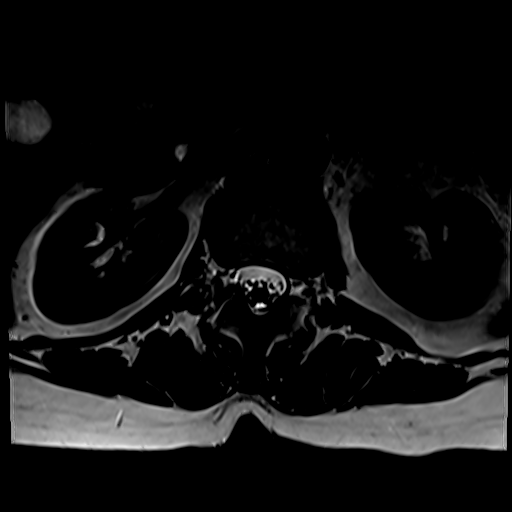
[im 35/39]
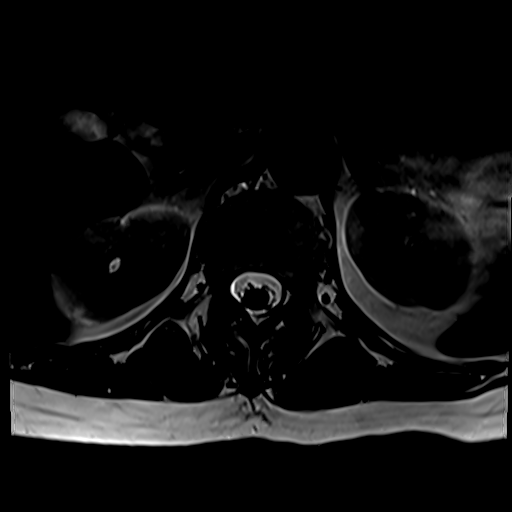
[im 39/39]
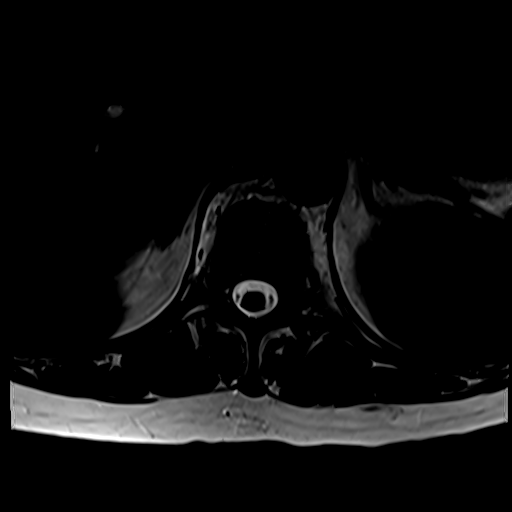

[Series 13: T2 · sagittal · 4.0mm · 0.73mm/px · 4 of 13 slices shown (2 of 2)]
[im 1/13]
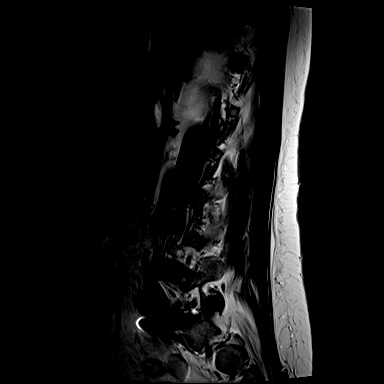
[im 5/13]
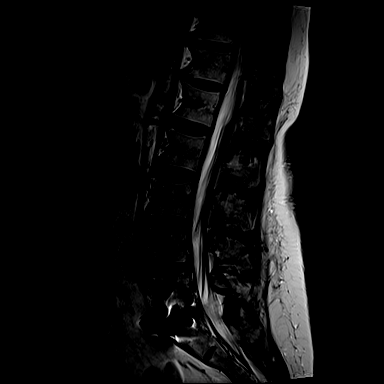
[im 9/13]
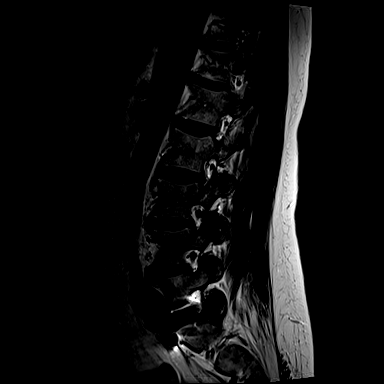
[im 13/13]
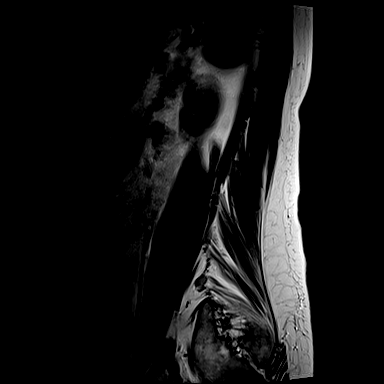

[22 of 48 positions shown; findings below may reference images not displayed]

FINDINGS: Segmentation:  5 lumbar type vertebral bodies.

Alignment: Normal except for 1 mm of anterolisthesis at L3-4
secondary to facet disease described below.

Vertebrae:  No fracture or primary bone lesion.

Conus medullaris and cauda equina: Conus extends to the L1-2 level.
Conus and cauda equina appear normal.

Paraspinal and other soft tissues: Negative

Disc levels:

Minimal, non-compressive disc bulges at L2-3 and above.

L3-4: Mild bulging of the disc. No compressive canal or foraminal
stenosis. Facet osteoarthritis with mild edema and enhancement that
could contribute to low back pain.

L4-5: Annular bulging with annular fissure. No compressive stenosis.
Annular fissures can be painful.

L5-S1: Previous anterior discectomy at L5-S1 with large interbody
spacer that creates a great deal of artifact. Apparent sufficient
patency of the canal and foramina. No complicating feature
identified.
IMPRESSION: Previous anterior discectomy and fusion procedure at L5-S1. Artifact
related to the interbody spacer, but no complicating features
suspected at this level.

L4-5: Annular bulging with annular fissure. No neural compression,
but this could be associated with low back.

L3-4: Disc bulge. Bilateral facet osteoarthritis with edema and
enhancement. 1 mm of anterolisthesis. This could certainly be a
cause of back pain or referred facet syndrome pain.

## 2020-11-02 DIAGNOSIS — M79605 Pain in left leg: Secondary | ICD-10-CM | POA: Diagnosis not present

## 2020-11-02 DIAGNOSIS — M545 Low back pain, unspecified: Secondary | ICD-10-CM | POA: Diagnosis not present

## 2020-11-02 DIAGNOSIS — R262 Difficulty in walking, not elsewhere classified: Secondary | ICD-10-CM | POA: Diagnosis not present

## 2020-11-03 ENCOUNTER — Other Ambulatory Visit: Payer: Self-pay

## 2020-11-03 ENCOUNTER — Other Ambulatory Visit: Payer: Self-pay | Admitting: Internal Medicine

## 2020-11-03 MED ORDER — ROSUVASTATIN CALCIUM 20 MG PO TABS
20.0000 mg | ORAL_TABLET | Freq: Every day | ORAL | 0 refills | Status: DC
Start: 2020-11-03 — End: 2021-01-29

## 2020-11-03 MED ORDER — AMLODIPINE BESYLATE 5 MG PO TABS
5.0000 mg | ORAL_TABLET | Freq: Every day | ORAL | 0 refills | Status: DC
Start: 2020-11-03 — End: 2021-01-29

## 2020-11-03 NOTE — Telephone Encounter (Signed)
Rx request sent to pharmacy.  

## 2020-11-03 NOTE — Telephone Encounter (Signed)
*  STAT* If patient is at the pharmacy, call can be transferred to refill team.   1. Which medications need to be refilled? (please list name of each medication and dose if known)  Crestor, Amlodipine  2. Which pharmacy/location (including street and city if local pharmacy) is medication to be sent to [facility]  3. Do they need a 30 day or 90 day supply? [place]

## 2020-12-07 ENCOUNTER — Other Ambulatory Visit (INDEPENDENT_AMBULATORY_CARE_PROVIDER_SITE_OTHER): Payer: Medicare Other

## 2020-12-07 ENCOUNTER — Other Ambulatory Visit: Payer: Self-pay

## 2020-12-07 DIAGNOSIS — I10 Essential (primary) hypertension: Secondary | ICD-10-CM

## 2020-12-07 DIAGNOSIS — E785 Hyperlipidemia, unspecified: Secondary | ICD-10-CM

## 2020-12-07 DIAGNOSIS — R739 Hyperglycemia, unspecified: Secondary | ICD-10-CM

## 2020-12-07 LAB — LIPID PANEL
Cholesterol: 155 mg/dL (ref 0–200)
HDL: 57.1 mg/dL (ref >39.00)
LDL Cholesterol: 75 mg/dL (ref 0–99)
NonHDL: 98.12
Total CHOL/HDL Ratio: 3
Triglycerides: 115 mg/dL (ref 0.0–149.0)
VLDL: 23 mg/dL (ref 0.0–40.0)

## 2020-12-07 LAB — CBC WITH DIFFERENTIAL/PLATELET
Basophils Absolute: 0.1 10*3/uL (ref 0.0–0.1)
Basophils Relative: 2 % (ref 0.0–3.0)
Eosinophils Absolute: 0.2 10*3/uL (ref 0.0–0.7)
Eosinophils Relative: 3.2 % (ref 0.0–5.0)
HCT: 37.5 % (ref 36.0–46.0)
Hemoglobin: 12.6 g/dL (ref 12.0–15.0)
Lymphocytes Relative: 29.3 % (ref 12.0–46.0)
Lymphs Abs: 1.4 10*3/uL (ref 0.7–4.0)
MCHC: 33.7 g/dL (ref 30.0–36.0)
MCV: 86.9 fl (ref 78.0–100.0)
Monocytes Absolute: 0.4 10*3/uL (ref 0.1–1.0)
Monocytes Relative: 7.9 % (ref 3.0–12.0)
Neutro Abs: 2.8 10*3/uL (ref 1.4–7.7)
Neutrophils Relative %: 57.6 % (ref 43.0–77.0)
Platelets: 214 10*3/uL (ref 150.0–400.0)
RBC: 4.32 Mil/uL (ref 3.87–5.11)
RDW: 14.9 % (ref 11.5–15.5)
WBC: 4.8 10*3/uL (ref 4.0–10.5)

## 2020-12-07 LAB — BASIC METABOLIC PANEL
BUN: 19 mg/dL (ref 6–23)
CO2: 30 mEq/L (ref 19–32)
Calcium: 9.5 mg/dL (ref 8.4–10.5)
Chloride: 105 mEq/L (ref 96–112)
Creatinine, Ser: 0.76 mg/dL (ref 0.40–1.20)
GFR: 77.63 mL/min (ref >60.00)
Glucose, Bld: 92 mg/dL (ref 70–99)
Potassium: 3.9 mEq/L (ref 3.5–5.1)
Sodium: 139 mEq/L (ref 135–145)

## 2020-12-07 LAB — HEPATIC FUNCTION PANEL
ALT: 17 U/L (ref 0–35)
AST: 16 U/L (ref 0–37)
Albumin: 4.1 g/dL (ref 3.5–5.2)
Alkaline Phosphatase: 62 U/L (ref 39–117)
Bilirubin, Direct: 0.1 mg/dL (ref 0.0–0.3)
Total Bilirubin: 0.8 mg/dL (ref 0.2–1.2)
Total Protein: 6.8 g/dL (ref 6.0–8.3)

## 2020-12-07 LAB — HEMOGLOBIN A1C: Hgb A1c MFr Bld: 5.9 % (ref 4.6–6.5)

## 2020-12-11 ENCOUNTER — Other Ambulatory Visit: Payer: Self-pay

## 2020-12-11 ENCOUNTER — Telehealth (INDEPENDENT_AMBULATORY_CARE_PROVIDER_SITE_OTHER): Payer: Medicare Other | Admitting: Internal Medicine

## 2020-12-11 DIAGNOSIS — R739 Hyperglycemia, unspecified: Secondary | ICD-10-CM

## 2020-12-11 DIAGNOSIS — E785 Hyperlipidemia, unspecified: Secondary | ICD-10-CM | POA: Diagnosis not present

## 2020-12-11 DIAGNOSIS — M545 Low back pain, unspecified: Secondary | ICD-10-CM | POA: Diagnosis not present

## 2020-12-11 DIAGNOSIS — R1084 Generalized abdominal pain: Secondary | ICD-10-CM | POA: Diagnosis not present

## 2020-12-11 DIAGNOSIS — I10 Essential (primary) hypertension: Secondary | ICD-10-CM

## 2020-12-11 DIAGNOSIS — I6523 Occlusion and stenosis of bilateral carotid arteries: Secondary | ICD-10-CM | POA: Diagnosis not present

## 2020-12-11 NOTE — Progress Notes (Signed)
Virtual Visit via telephone Note  This visit type was conducted due to national recommendations for restrictions regarding the COVID-19 pandemic (e.g. social distancing).  This format is felt to be most appropriate for this patient at this time.  All issues noted in this document were discussed and addressed.  No physical exam was performed (except for noted visual exam findings with Video Visits).   I connected with Kristen Strickland by telephone and verified that I am speaking with the correct person using two identifiers. Location patient: home Location provider: work  Persons participating in the virtual visit: patient, provider  The limitations, risks, security and privacy concerns of performing an evaluation and management service by telephone and the availability of in person appointments have been discussed.  It has also been discussed with the patient that there may be a patient responsible charge related to this service. The patient expressed understanding and agreed to proceed.   Reason for visit: scheduled follow up.     HPI: Here for follow up regarding her cholesterol and blood pressure.  She has been seeing [facility] for her back.  PT helped her lower extremity swelling.  Resolved with exercises. She plans to start doing her exercises at home now.  No chest pain or sob with increased activity or exertion reported. No bowel change reported.  She does report an explosion feeling - waist up.  This occurs intermittently and has for a while.  See previous notes.  Occurs when she is lying down at night.  No known triggers.  If she pulls her legs up - alleviates her pain. She was questioning if could be coming from her back issues.  Has discussed with surgery.  When she is up and moving - no pain.  Eating.  No nausea or vomiting.  Bowels are moving.  Breathing stable.  No chest pain reported.     ROS: See pertinent positives and negatives per HPI.  Past Medical History:  Diagnosis Date  .  Arthritis   . Carotid arterial disease ([place])    a. 11/2017 Carotid U/S: RICA 9-47%, LICA 09-62%.   . Chronic fatigue   . Chronic leg pain   . DDD (degenerative disc disease), lumbar   . Dyspnea on exertion    a. 02/2017 Echo: EF 60-65%, no rwma, mild MR. Nl RV size/fxn. Nl PASP; b. 02/2017 MV: small, mild, fixed basal inferolateral defect - ? artifact vs scar. No ischemia. EF >65%.  . Hypercholesterolemia   . Hypertension   . Insomnia   . Palpitations    a. 02/2017 Holter: Avg HR 66 (51-115), rare PACs, four brief runs of PAT - up to 5 beats, max rate 157. Rare isolated PVCs. No sustained arrhythmias; b. 03/2018 24h Holter: Rare PACs, 3 beat run PAT (122 bpm), PVCs (2% of beats).  . Radiculopathy    Lumbar region  . Sinus bradycardia   . Spondylolisthesis    lumbosacral region    Past Surgical History:  Procedure Laterality Date  . ABDOMINAL EXPOSURE N/A 11/10/2018   Procedure: ABDOMINAL EXPOSURE;  Surgeon: Angelia Mould, MD;  Location: [place];  Service: Vascular;  Laterality: N/A;  . ABDOMINAL HYSTERECTOMY  1975  . ANTERIOR CERVICAL DECOMP/DISCECTOMY FUSION N/A 04/09/2013   Procedure: ANTERIOR CERVICAL DECOMPRESSION/DISCECTOMY FUSION 2 LEVELS;  Surgeon: Floyce Stakes, MD;  Location: [facility];  Service: Neurosurgery;  Laterality: N/A;  Cervical five-six Cervical six-seven  Anterior cervical decompression/diskectomy/fusion  . ANTERIOR LUMBAR FUSION N/A 11/10/2018   Procedure:  Anterior Lumbar Interbody Fusion-Lumbar five-Sacral one;  Surgeon: Earnie Larsson, MD;  Location: [place];  Service: Neurosurgery;  Laterality: N/A;  . BACK SURGERY     ruptured disc  . RECONSTRUCTION OF NOSE      Family History  Problem Relation Age of Onset  . Cancer Mother        ovarian/uterine  . Heart disease Mother   . Heart attack Mother   . Stroke Mother   . Colon cancer Father   . Dementia Father   . COPD Brother   . Congestive Heart Failure Brother   . Stroke Daughter   . Heart attack  Daughter   . Suicidality Brother   . Breast cancer Neg Hx     SOCIAL HX: reviewed.    Current Outpatient Medications:  .  amLODipine (NORVASC) 5 MG tablet, Take 1 tablet (5 mg total) by mouth daily., Disp: 90 tablet, Rfl: 0 .  aspirin EC 81 MG tablet, Take 81 mg by mouth daily., Disp: , Rfl:  .  gabapentin (NEURONTIN) 100 MG capsule, Take 100 mg by mouth 3 (three) times daily., Disp: , Rfl:  .  losartan-hydrochlorothiazide (HYZAAR) 100-25 MG tablet, TAKE 1 TABLET BY MOUTH ONCE DAILY., Disp: 30 tablet, Rfl: 11 .  Multiple Vitamins-Minerals (CENTRUM PO), Take 1 tablet by mouth daily. , Disp: , Rfl:  .  omeprazole (PRILOSEC) 20 MG capsule, TAKE (1) CAPSULE BY MOUTH ONCE DAILY., Disp: 90 capsule, Rfl: 3 .  rosuvastatin (CRESTOR) 20 MG tablet, Take 1 tablet (20 mg total) by mouth daily., Disp: 90 tablet, Rfl: 0 .  traMADol (ULTRAM) 50 MG tablet, Take 50 mg by mouth every 6 (six) hours as needed., Disp: , Rfl:  .  valACYclovir (VALTREX) 1000 MG tablet, Take 1 tablet (1,000 mg total) by mouth 3 (three) times daily., Disp: 21 tablet, Rfl: 0  EXAM:  GENERAL: alert. Sounds to be in no acute distress.  Answering questions appropriately.  PSYCH/NEURO: pleasant and cooperative, no obvious depression or anxiety, speech and thought processing grossly intact  ASSESSMENT AND PLAN:  Discussed the following assessment and plan:  Problem List Items Addressed This Visit    Abdominal pain    Describes an explosion sensation as outlined.  Only occurs when lying down.  No associated GI symptoms.  No known triggers.  Discussed further w/up and evaluation.  Discussed obtaining CT san. She declines. Wants to follow.       Back pain    Seeing [facility] s/p surgery.  PT helped.  Swelling improved.  Continue home exercises.        Carotid stenosis    Appears to have seen Dr Delana Meyer 06/2020.  Previous visit 12/2019.  Continue aspirin and crestor.       Hyperglycemia    Low carb diet and exercise.  Follow met b  and a1c.       Relevant Orders   Hemoglobin A1c   Hyperlipidemia LDL goal <70    On crestor.  Low cholesterol diet and exercise.  Follow lipid panel and liver function tests.        Relevant Orders   Hepatic function panel   Lipid panel   Hypertension    Blood pressure has been doing better.  Continue losartan/hctz and amlodipine.  Continue current medication regimen.  Follow pressures.  Follow metabolic panel.       Relevant Orders   Basic metabolic panel       I discussed the assessment and treatment plan with the  patient. The patient was provided an opportunity to ask questions and all were answered. The patient agreed with the plan and demonstrated an understanding of the instructions.   The patient was advised to call back or seek an in-person evaluation if the symptoms worsen or if the condition fails to improve as anticipated.  I provided 25 minutes of non-face-to-face time during this encounter.   Einar Pheasant, MD

## 2020-12-12 ENCOUNTER — Other Ambulatory Visit: Payer: Self-pay | Admitting: Internal Medicine

## 2020-12-12 DIAGNOSIS — Z1231 Encounter for screening mammogram for malignant neoplasm of breast: Secondary | ICD-10-CM

## 2020-12-17 ENCOUNTER — Encounter: Payer: Self-pay | Admitting: Internal Medicine

## 2020-12-17 NOTE — Assessment & Plan Note (Signed)
Describes an explosion sensation as outlined.  Only occurs when lying down.  No associated GI symptoms.  No known triggers.  Discussed further w/up and evaluation.  Discussed obtaining CT san. She declines. Wants to follow.

## 2020-12-17 NOTE — Assessment & Plan Note (Signed)
Appears to have seen Dr Delana Meyer 06/2020.  Previous visit 12/2019.  Continue aspirin and crestor.

## 2020-12-17 NOTE — Assessment & Plan Note (Signed)
Seeing [facility] s/p surgery.  PT helped.  Swelling improved.  Continue home exercises.

## 2020-12-17 NOTE — Assessment & Plan Note (Signed)
Low carb diet and exercise.  Follow met b and a1c.

## 2020-12-17 NOTE — Assessment & Plan Note (Signed)
On crestor.  Low cholesterol diet and exercise.  Follow lipid panel and liver function tests.   

## 2020-12-17 NOTE — Assessment & Plan Note (Signed)
Blood pressure has been doing better.  Continue losartan/hctz and amlodipine.  Continue current medication regimen.  Follow pressures.  Follow metabolic panel.

## 2020-12-28 ENCOUNTER — Ambulatory Visit
Admission: RE | Admit: 2020-12-28 | Discharge: 2020-12-28 | Disposition: A | Discharge status: Home / Self Care | Payer: Medicare Other | Source: Ambulatory Visit | Attending: Internal Medicine | Admitting: Internal Medicine

## 2020-12-28 ENCOUNTER — Other Ambulatory Visit: Payer: Self-pay

## 2020-12-28 DIAGNOSIS — Z1231 Encounter for screening mammogram for malignant neoplasm of breast: Secondary | ICD-10-CM | POA: Insufficient documentation

## 2020-12-29 DIAGNOSIS — M65332 Trigger finger, left middle finger: Secondary | ICD-10-CM | POA: Diagnosis not present

## 2021-01-05 DIAGNOSIS — H40013 Open angle with borderline findings, low risk, bilateral: Secondary | ICD-10-CM | POA: Diagnosis not present

## 2021-01-29 ENCOUNTER — Other Ambulatory Visit: Payer: Self-pay | Admitting: Internal Medicine

## 2021-02-12 DIAGNOSIS — M11262 Other chondrocalcinosis, left knee: Secondary | ICD-10-CM | POA: Diagnosis not present

## 2021-02-12 DIAGNOSIS — M159 Polyosteoarthritis, unspecified: Secondary | ICD-10-CM | POA: Diagnosis not present

## 2021-02-12 DIAGNOSIS — R252 Cramp and spasm: Secondary | ICD-10-CM | POA: Diagnosis not present

## 2021-02-12 DIAGNOSIS — M1712 Unilateral primary osteoarthritis, left knee: Secondary | ICD-10-CM | POA: Diagnosis not present

## 2021-02-12 DIAGNOSIS — M19011 Primary osteoarthritis, right shoulder: Secondary | ICD-10-CM | POA: Diagnosis not present

## 2021-03-20 IMAGING — DX DG ABDOMEN 1V
1 series · 1 of 1 positions shown · non-contrast
Comparison: 11/10/2018

CLINICAL DATA: Abdominal pain

EXAM:
ABDOMEN - 1 VIEW

[abdomen standing ap]
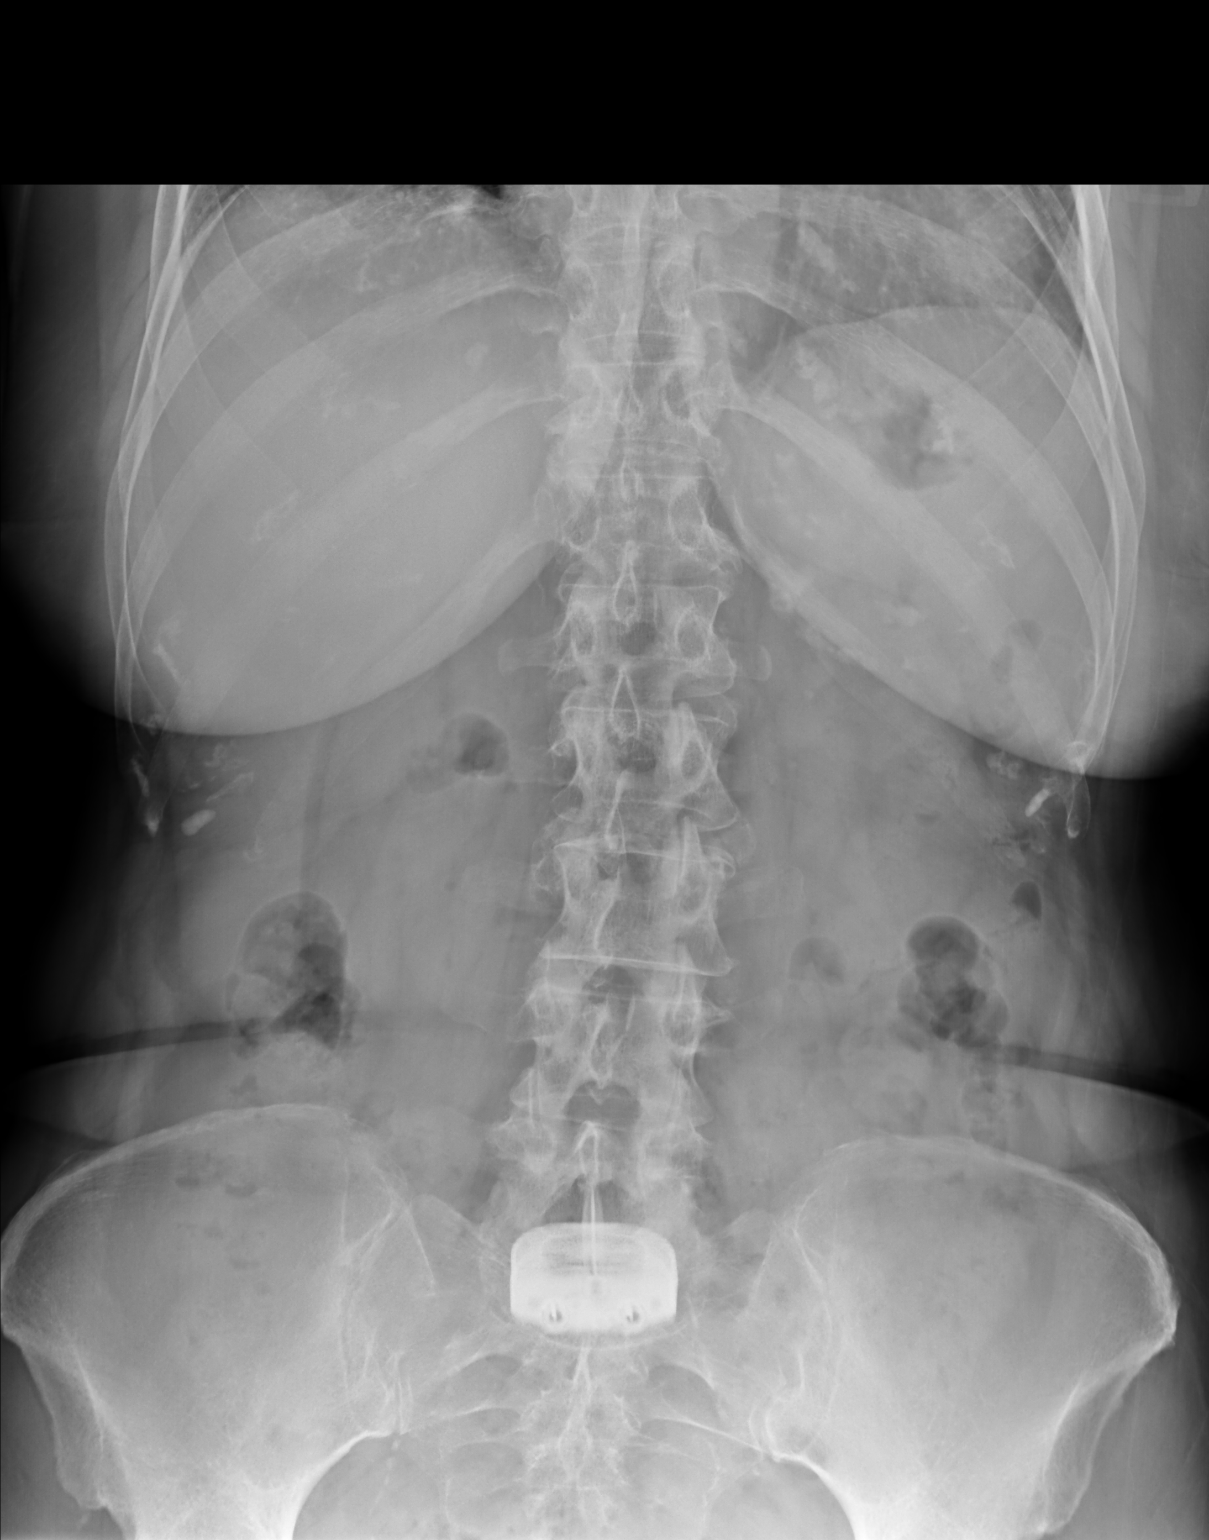

[1 of 1 positions shown; findings below may reference images not displayed]

FINDINGS: Scattered large and small bowel gas is noted. No obstructive changes
are seen. No definitive renal or ureteral stones are noted. No free
air is seen. Postsurgical changes of the lumbosacral junction are
seen.
IMPRESSION: No acute abnormality noted.

## 2021-04-04 ENCOUNTER — Telehealth: Payer: Self-pay

## 2021-04-04 ENCOUNTER — Ambulatory Visit: Payer: Medicare Other

## 2021-04-04 NOTE — Telephone Encounter (Signed)
Called patient for scheduled AWV on preferred number. No answer. Left message. Reschedule as appropriate.

## 2021-04-18 ENCOUNTER — Ambulatory Visit (INDEPENDENT_AMBULATORY_CARE_PROVIDER_SITE_OTHER): Payer: Medicare Other

## 2021-04-18 VITALS — Ht 64.0 in | Wt 138.0 lb

## 2021-04-18 DIAGNOSIS — Z0000 Encounter for general adult medical examination without abnormal findings: Secondary | ICD-10-CM | POA: Diagnosis not present

## 2021-04-18 NOTE — Progress Notes (Addendum)
Subjective:   Kristen Strickland is a 75 y.o. female who presents for Medicare Annual (Subsequent) preventive examination.  Review of Systems    No ROS.  Medicare Wellness Virtual Visit.  Visual/audio telehealth visit, UTA vital signs.   See social history for additional risk factors.   Cardiac Risk Factors include: advanced age (>7men, >81 women)     Objective:    Today's Vitals   04/18/21 1453  Weight: 138 lb (62.6 kg)  Height: 5\' 4"  (1.626 m)   Body mass index is 23.69 kg/m.  Advanced Directives 04/18/2021 04/03/2020 10/13/2019 09/14/2019 03/25/2019 10/28/2018 03/20/2018  Does Patient Have a Medical Advance Directive? No No No No No No No  Would patient like information on creating a medical advance directive? No - Patient declined No - Patient declined - - No - Patient declined No - Patient declined No - Patient declined  Pre-existing out of facility DNR order (yellow form or pink MOST form) - - - - - - -    Current Medications (verified) Outpatient Encounter Medications as of 04/18/2021  Medication Sig   amLODipine (NORVASC) 5 MG tablet TAKE 1 TABLET BY MOUTH ONCE DAILY.   aspirin EC 81 MG tablet Take 81 mg by mouth daily.   gabapentin (NEURONTIN) 100 MG capsule Take 100 mg by mouth 3 (three) times daily.   losartan-hydrochlorothiazide (HYZAAR) 100-25 MG tablet TAKE 1 TABLET BY MOUTH ONCE DAILY.   Multiple Vitamins-Minerals (CENTRUM PO) Take 1 tablet by mouth daily.    omeprazole (PRILOSEC) 20 MG capsule TAKE (1) CAPSULE BY MOUTH ONCE DAILY.   rosuvastatin (CRESTOR) 20 MG tablet TAKE 1 TABLET BY MOUTH ONCE DAILY FOR CHOLESTEROL   traMADol (ULTRAM) 50 MG tablet Take 50 mg by mouth every 6 (six) hours as needed.   valACYclovir (VALTREX) 1000 MG tablet Take 1 tablet (1,000 mg total) by mouth 3 (three) times daily.   No facility-administered encounter medications on file as of 04/18/2021.    Allergies (verified) Patient has no known allergies.   History: Past Medical History:   Diagnosis Date   Arthritis    Carotid arterial disease ([place])    a. 11/2017 Carotid U/S: RICA 0-93%, LICA 23-55%.    Chronic fatigue    Chronic leg pain    DDD (degenerative disc disease), lumbar    Dyspnea on exertion    a. 02/2017 Echo: EF 60-65%, no rwma, mild MR. Nl RV size/fxn. Nl PASP; b. 02/2017 MV: small, mild, fixed basal inferolateral defect - ? artifact vs scar. No ischemia. EF >65%.   Hypercholesterolemia    Hypertension    Insomnia    Palpitations    a. 02/2017 Holter: Avg HR 66 (51-115), rare PACs, four brief runs of PAT - up to 5 beats, max rate 157. Rare isolated PVCs. No sustained arrhythmias; b. 03/2018 24h Holter: Rare PACs, 3 beat run PAT (122 bpm), PVCs (2% of beats).   Radiculopathy    Lumbar region   Sinus bradycardia    Spondylolisthesis    lumbosacral region   Past Surgical History:  Procedure Laterality Date   ABDOMINAL EXPOSURE N/A 11/10/2018   Procedure: ABDOMINAL EXPOSURE;  Surgeon: Angelia Mould, MD;  [facility];  Service: Vascular;  Laterality: N/A;   [place] DECOMP/DISCECTOMY FUSION N/A 04/09/2013   Procedure: ANTERIOR CERVICAL DECOMPRESSION/DISCECTOMY FUSION 2 LEVELS;  Surgeon: Floyce Stakes, MD;  Location: [facility];  Service: Neurosurgery;  Laterality: N/A;  Cervical five-six Cervical  six-seven  Anterior cervical decompression/diskectomy/fusion   ANTERIOR LUMBAR FUSION N/A 11/10/2018   Procedure: Anterior Lumbar Interbody Fusion-Lumbar five-Sacral one;  Surgeon: Earnie Larsson, MD;  Location: [place];  Service: Neurosurgery;  Laterality: N/A;   BACK SURGERY     ruptured disc   RECONSTRUCTION OF NOSE     Family History  Problem Relation Age of Onset   Cancer Mother        ovarian/uterine   Heart disease Mother    Heart attack Mother    Stroke Mother    Colon cancer Father    Dementia Father    COPD Brother    Congestive Heart Failure Brother    Stroke Daughter    Heart attack Daughter     Suicidality Brother    Breast cancer Neg Hx    Social History   Socioeconomic History   Marital status: Married    Spouse name: Not on file   Number of children: 2   Years of education: Not on file   Highest education level: Not on file  Occupational History   Not on file  Tobacco Use   Smoking status: Never   Smokeless tobacco: Never  Vaping Use   Vaping Use: Never used  Substance and Sexual Activity   Alcohol use: Not Currently    Alcohol/week: 0.0 standard drinks    Comment: rarely   Drug use: No   Sexual activity: Yes  Other Topics Concern   Not on file  Social History Narrative   Not on file   Social Determinants of Health   Financial Resource Strain: Low Risk    Difficulty of Paying Living Expenses: Not hard at all  Food Insecurity: No Food Insecurity   Worried About Charity fundraiser in the Last Year: Never true   [place] in the Last Year: Never true  Transportation Needs: No Transportation Needs   Lack of Transportation (Medical): No   Lack of Transportation (Non-Medical): No  Physical Activity: Sufficiently Active   Days of Exercise per Week: 5 days   Minutes of Exercise per Session: 30 min  Stress: No Stress Concern Present   Feeling of Stress : Not at all  Social Connections: Unknown   Frequency of Communication with Friends and Family: More than three times a week   Frequency of Social Gatherings with Friends and Family: More than three times a week   Attends Religious Services: Not on Electrical engineer or Organizations: Not on file   Attends Archivist Meetings: Not on file   Marital Status: Not on file    Tobacco Counseling Counseling given: Not Answered   Clinical Intake:  Pre-visit preparation completed: Yes        Diabetes: No  How often do you need to have someone help you when you read instructions, pamphlets, or other written materials from your doctor or pharmacy?: 1 - Never    Interpreter  Needed?: No      Activities of Daily Living In your present state of health, do you have any difficulty performing the following activities: 04/18/2021  Hearing? N  Vision? N  Difficulty concentrating or making decisions? N  Walking or climbing stairs? Y  Comment Paces self  Dressing or bathing? N  Doing errands, shopping? N  Preparing Food and eating ? N  Using the Toilet? N  In the past six months, have you accidently leaked urine? N  Do you have problems with loss  of bowel control? N  Managing your Medications? N  Managing your Finances? N  Housekeeping or managing your Housekeeping? N  Some recent data might be hidden    Patient Care Team: Einar Pheasant, MD as PCP - General (Internal Medicine) End, Harrell Gave, MD as PCP - Cardiology (Cardiology) Earnie Larsson, MD as Consulting Physician (Neurosurgery)  Indicate any recent Medical Services you may have received from other than [facility] providers in the past year (date may be approximate).     Assessment:   This is a routine wellness examination for Kristen Strickland.  I connected with Kristen Strickland today by telephone and verified that I am speaking with the correct person using two identifiers. Location patient: home Location provider: work Persons participating in the virtual visit: patient, Marine scientist.    I discussed the limitations, risks, security and privacy concerns of performing an evaluation and management service by telephone and the availability of in person appointments. The patient expressed understanding and verbally consented to this telephonic visit.    Interactive audio and video telecommunications were attempted between this provider and patient, however failed, due to patient having technical difficulties OR patient did not have access to video capability.  We continued and completed visit with audio only.  Some vital signs may be absent or patient reported.   Hearing/Vision screen Hearing Screening - Comments:: Patient has  difficulty hearing the clarity of the words when listening to music. Followed by [facility]. She does not wear hearing aids. Vision Screening - Comments:: Wears reader lenses  Visual acuity not assessed, virtual visit. They have seen their ophthalmologist in the last 12 months.   Dietary issues and exercise activities discussed: Current Exercise Habits: Home exercise routine, Intensity: Mild Healthy diet Good water intake   Goals Addressed             This Visit's Progress    Healthy Lifestyle       Stay hydrated and drink plenty of fluids/water. Low carb foods. Lean meats and vegetables. Stay active.  Walk for exercise, as tolerated.        Depression Screen PHQ 2/9 Scores 04/18/2021 04/03/2020 03/25/2019 03/20/2018 03/19/2017 03/12/2016 02/08/2015  PHQ - 2 Score 0 0 1 0 0 0 0  PHQ- 9 Score - - - - 0 - -    Fall Risk Fall Risk  04/18/2021 04/03/2020 03/25/2019 03/20/2018 03/19/2017  Falls in the past year? 0 0 0 Yes No  Number falls in past yr: 0 0 - 1 -  Injury with Fall? 0 0 - No -  Comment - - - She missed a step off of a ladder but did not get hurt.  -  Risk for fall due to : - - - - -  Risk for fall due to: Comment - - - - -  Follow up Falls evaluation completed Falls evaluation completed - - -    FALL RISK PREVENTION PERTAINING TO THE HOME: Handrails in use when climbing stairs ? Yes Home free of loose throw rugs in walkways, pet beds, electrical cords, etc? Yes  Adequate lighting in your home to reduce risk of falls? Yes   ASSISTIVE DEVICES UTILIZED TO PREVENT FALLS: Life alert? No  Use of a cane, walker or w/c? No    TIMED UP AND GO: Was the test performed? No .   Cognitive Function: MMSE - Mini Mental State Exam 03/20/2018 03/19/2017 03/12/2016  Orientation to time 5 5 5   Orientation to Place 5 5 5   Registration 3 3  3  Attention/ Calculation 4 3 5   Attention/Calculation-comments - difficulty with simple calculation -  Recall 1 1 2   Language- name 2 objects 2 2  2   Language- repeat 1 1 1   Language- follow 3 step command 3 3 3   Language- read & follow direction 1 1 1   Write a sentence 1 1 1   Copy design 1 1 1   Total score 27 26 29      6CIT Screen 04/18/2021 04/03/2020 03/25/2019  What Year? 0 points 0 points 0 points  What month? 0 points 0 points 0 points  What time? 0 points - 0 points  Count back from 20 - 0 points 0 points  Months in reverse 0 points 0 points 0 points  Repeat phrase - 2 points -    Immunizations Immunization History  Administered Date(s) Administered   Moderna Sars-Covid-2 Vaccination 12/24/2019, 01/28/2020    TDAP status: Due, Education has been provided regarding the importance of this vaccine. Advised may receive this vaccine at local pharmacy or Health Dept. Aware to provide a copy of the vaccination record if obtained from local pharmacy or Health Dept. Verbalized acceptance and understanding. Deferred.  Shingles- deferred per patient   Covid vaccines - discontinued per patient preference  Health Maintenance Health Maintenance  Topic Date Due   TETANUS/TDAP  Never done   Zoster Vaccines- Shingrix (1 of 2) Never done   INFLUENZA VACCINE  05/21/2021   COLONOSCOPY (Pts 45-74yrs Insurance coverage will need to be confirmed)  03/04/2024   DEXA SCAN  Completed   Hepatitis C Screening  Completed   HPV VACCINES  Aged Out   MAMMOGRAM  Discontinued   COVID-19 Vaccine  Discontinued   PNA vac Low Risk Adult  Discontinued   Lung Cancer Screening: (Low Dose CT Chest recommended if Age 56-80 years, 30 pack-year currently smoking OR have quit w/in 15years.) does not qualify.   Dental Screening: Recommended annual dental exams for proper oral hygiene  Community Resource Referral / Chronic Care Management: CRR required this visit?  No   CCM required this visit?  No      Plan:   Keep all routine maintenance appointments.   I have personally reviewed and noted the following in the patient's chart:   Medical and  social history Use of alcohol, tobacco or illicit drugs  Current medications and supplements including opioid prescriptions.  Functional ability and status Nutritional status Physical activity Advanced directives List of other physicians Hospitalizations, surgeries, and ER visits in previous 12 months Vitals Screenings to include cognitive, depression, and falls Referrals and appointments  In addition, I have reviewed and discussed with patient certain preventive protocols, quality metrics, and best practice recommendations. A written personalized care plan for preventive services as well as general preventive health recommendations were provided to patient via mail.     OBrien-Blaney, Navdeep Halt L, LPN   8/92/1194     I have reviewed the above information and agree with above.   Deborra Medina, MD

## 2021-04-18 NOTE — Patient Instructions (Addendum)
Kristen Strickland , Thank you for taking time to come for your Medicare Wellness Visit. I appreciate your ongoing commitment to your health goals. Please review the following plan we discussed and let me know if I can assist you in the future.   These are the goals we discussed:  Goals       Patient Stated     Follow up with Primary Care Provider (pt-stated)      As needed       Other     Healthy Lifestyle      Stay hydrated and drink plenty of fluids/water. Low carb foods. Lean meats and vegetables. Stay active.  Walk for exercise, as tolerated.         This is a list of the screening recommended for you and due dates:  Health Maintenance  Topic Date Due   Tetanus Vaccine  Never done   Zoster (Shingles) Vaccine (1 of 2) Never done   Flu Shot  05/21/2021   Colon Cancer Screening  03/04/2024   DEXA scan (bone density measurement)  Completed   Hepatitis C Screening: USPSTF Recommendation to screen - Ages 62-79 yo.  Completed   HPV Vaccine  Aged Out   Mammogram  Discontinued   COVID-19 Vaccine  Discontinued   Pneumonia vaccines  Discontinued    Advanced directives: not yet completed  Conditions/risks identified: none new  Follow up in one year for your annual wellness visit    Preventive Care 65 Years and Older, Female Preventive care refers to lifestyle choices and visits with your health care provider that can promote health and wellness. What does preventive care include? A yearly physical exam. This is also called an annual well check. Dental exams once or twice a year. Routine eye exams. Ask your health care provider how often you should have your eyes checked. Personal lifestyle choices, including: Daily care of your teeth and gums. Regular physical activity. Eating a healthy diet. Avoiding tobacco and drug use. Limiting alcohol use. Practicing safe sex. Taking low-dose aspirin every day. Taking vitamin and mineral supplements as recommended by your health care  provider. What happens during an annual well check? The services and screenings done by your health care provider during your annual well check will depend on your age, overall health, lifestyle risk factors, and family history of disease. Counseling  Your health care provider may ask you questions about your: Alcohol use. Tobacco use. Drug use. Emotional well-being. Home and relationship well-being. Sexual activity. Eating habits. History of falls. Memory and ability to understand (cognition). Work and work Statistician. Reproductive health. Screening  You may have the following tests or measurements: Height, weight, and BMI. Blood pressure. Lipid and cholesterol levels. These may be checked every 5 years, or more frequently if you are over 68 years old. Skin check. Lung cancer screening. You may have this screening every year starting at age 71 if you have a 30-pack-year history of smoking and currently smoke or have quit within the past 15 years. Fecal occult blood test (FOBT) of the stool. You may have this test every year starting at age 53. Flexible sigmoidoscopy or colonoscopy. You may have a sigmoidoscopy every 5 years or a colonoscopy every 10 years starting at age 64. Hepatitis C blood test. Hepatitis B blood test. Sexually transmitted disease (STD) testing. Diabetes screening. This is done by checking your blood sugar (glucose) after you have not eaten for a while (fasting). You may have this done every 1-3 years. Bone  density scan. This is done to screen for osteoporosis. You may have this done starting at age 75. Mammogram. This may be done every 1-2 years. Talk to your health care provider about how often you should have regular mammograms. Talk with your health care provider about your test results, treatment options, and if necessary, the need for more tests. Vaccines  Your health care provider may recommend certain vaccines, such as: Influenza vaccine. This is  recommended every year. Tetanus, diphtheria, and acellular pertussis (Tdap, Td) vaccine. You may need a Td booster every 10 years. Zoster vaccine. You may need this after age 15. Pneumococcal 13-valent conjugate (PCV13) vaccine. One dose is recommended after age 80. Pneumococcal polysaccharide (PPSV23) vaccine. One dose is recommended after age 63. Talk to your health care provider about which screenings and vaccines you need and how often you need them. This information is not intended to replace advice given to you by your health care provider. Make sure you discuss any questions you have with your health care provider. Document Released: 11/03/2015 Document Revised: 06/26/2016 Document Reviewed: 08/08/2015 Elsevier Interactive Patient Education  2017 [employer] Prevention in the Home Falls can cause injuries. They can happen to people of all ages. There are many things you can do to make your home safe and to help prevent falls. What can I do on the outside of my home? Regularly fix the edges of walkways and driveways and fix any cracks. Remove anything that might make you trip as you walk through a door, such as a raised step or threshold. Trim any bushes or trees on the path to your home. Use bright outdoor lighting. Clear any walking paths of anything that might make someone trip, such as rocks or tools. Regularly check to see if handrails are loose or broken. Make sure that both sides of any steps have handrails. Any raised decks and porches should have guardrails on the edges. Have any leaves, snow, or ice cleared regularly. Use sand or salt on walking paths during winter. Clean up any spills in your garage right away. This includes oil or grease spills. What can I do in the bathroom? Use night lights. Install grab bars by the toilet and in the tub and shower. Do not use towel bars as grab bars. Use non-skid mats or decals in the tub or shower. If you need to sit down in  the shower, use a plastic, non-slip stool. Keep the floor dry. Clean up any water that spills on the floor as soon as it happens. Remove soap buildup in the tub or shower regularly. Attach bath mats securely with double-sided non-slip rug tape. Do not have throw rugs and other things on the floor that can make you trip. What can I do in the bedroom? Use night lights. Make sure that you have a light by your bed that is easy to reach. Do not use any sheets or blankets that are too big for your bed. They should not hang down onto the floor. Have a firm chair that has side arms. You can use this for support while you get dressed. Do not have throw rugs and other things on the floor that can make you trip. What can I do in the kitchen? Clean up any spills right away. Avoid walking on wet floors. Keep items that you use a lot in easy-to-reach places. If you need to reach something above you, use a strong step stool that has a grab bar. Keep  electrical cords out of the way. Do not use floor polish or wax that makes floors slippery. If you must use wax, use non-skid floor wax. Do not have throw rugs and other things on the floor that can make you trip. What can I do with my stairs? Do not leave any items on the stairs. Make sure that there are handrails on both sides of the stairs and use them. Fix handrails that are broken or loose. Make sure that handrails are as long as the stairways. Check any carpeting to make sure that it is firmly attached to the stairs. Fix any carpet that is loose or worn. Avoid having throw rugs at the top or bottom of the stairs. If you do have throw rugs, attach them to the floor with carpet tape. Make sure that you have a light switch at the top of the stairs and the bottom of the stairs. If you do not have them, ask someone to add them for you. What else can I do to help prevent falls? Wear shoes that: Do not have high heels. Have rubber bottoms. Are comfortable  and fit you well. Are closed at the toe. Do not wear sandals. If you use a stepladder: Make sure that it is fully opened. Do not climb a closed stepladder. Make sure that both sides of the stepladder are locked into place. Ask someone to hold it for you, if possible. Clearly mark and make sure that you can see: Any grab bars or handrails. First and last steps. Where the edge of each step is. Use tools that help you move around (mobility aids) if they are needed. These include: Canes. Walkers. Scooters. Crutches. Turn on the lights when you go into a dark area. Replace any light bulbs as soon as they burn out. Set up your furniture so you have a clear path. Avoid moving your furniture around. If any of your floors are uneven, fix them. If there are any pets around you, be aware of where they are. Review your medicines with your doctor. Some medicines can make you feel dizzy. This can increase your chance of falling. Ask your doctor what other things that you can do to help prevent falls. This information is not intended to replace advice given to you by your health care provider. Make sure you discuss any questions you have with your health care provider. Document Released: 08/03/2009 Document Revised: 03/14/2016 Document Reviewed: 11/11/2014 Elsevier Interactive Patient Education  2017 [employer].

## 2021-05-10 ENCOUNTER — Telehealth: Payer: Self-pay | Admitting: Internal Medicine

## 2021-05-10 NOTE — Telephone Encounter (Signed)
If having acute pain, needs to be seen.  Can see if someone has work in spot here.  If not, acute care to rule out more acute issues and then we can f/u after.

## 2021-05-10 NOTE — Telephone Encounter (Signed)
FYI I scheduled patient tomorrow at 1:30p with Joycelyn Schmid. She initially stated that she would see any one but you due to not being pleased with their care in the past. She stated that this has been going on for quite sometime & just needs to find out what is wrong. She said that she feels like she is having an "explosion" under her breast bone. She does not know a better way to describe it. It is usually at only night time & she feels tired after. She confirmed no left arm pain/numbness/tingling, SOB, n/v, blurry vision. Also she stated that she does not suffer with acid reflux at night & does take her Omeprazole every morning.

## 2021-05-10 NOTE — Telephone Encounter (Signed)
Patient wanted Dr Nicki Reaper to know that the pain has started again under her breast bone. This only happens at night, it almost feels like a cramp, but it's not. Patient said she spoke to her the last time she saw her.

## 2021-05-10 NOTE — Telephone Encounter (Signed)
Not sure if this was discussed at last appointment? Would you like me try to get patient scheduled with a provider or would you like to see patient?

## 2021-05-11 ENCOUNTER — Other Ambulatory Visit: Payer: Self-pay

## 2021-05-11 ENCOUNTER — Telehealth: Payer: Self-pay | Admitting: Internal Medicine

## 2021-05-11 ENCOUNTER — Encounter: Payer: Self-pay | Admitting: Family

## 2021-05-11 ENCOUNTER — Ambulatory Visit (INDEPENDENT_AMBULATORY_CARE_PROVIDER_SITE_OTHER): Payer: Medicare Other | Admitting: Family

## 2021-05-11 VITALS — BP 142/62 | HR 67 | Temp 98.2°F | Ht 64.0 in | Wt 142.0 lb

## 2021-05-11 DIAGNOSIS — I6523 Occlusion and stenosis of bilateral carotid arteries: Secondary | ICD-10-CM | POA: Diagnosis not present

## 2021-05-11 DIAGNOSIS — R5383 Other fatigue: Secondary | ICD-10-CM | POA: Diagnosis not present

## 2021-05-11 DIAGNOSIS — R1084 Generalized abdominal pain: Secondary | ICD-10-CM

## 2021-05-11 DIAGNOSIS — R6889 Other general symptoms and signs: Secondary | ICD-10-CM | POA: Diagnosis not present

## 2021-05-11 LAB — CBC WITH DIFFERENTIAL/PLATELET
Absolute Monocytes: 368 cells/uL (ref 200–950)
Basophils Absolute: 78 cells/uL (ref 0–200)
Basophils Relative: 1.6 %
Eosinophils Absolute: 221 cells/uL (ref 15–500)
Eosinophils Relative: 4.5 %
HCT: 35.6 % (ref 35.0–45.0)
Hemoglobin: 11.8 g/dL (ref 11.7–15.5)
Lymphs Abs: 1132 cells/uL (ref 850–3900)
MCH: 29.6 pg (ref 27.0–33.0)
MCHC: 33.1 g/dL (ref 32.0–36.0)
MCV: 89.2 fL (ref 80.0–100.0)
MPV: 10.9 fL (ref 7.5–12.5)
Monocytes Relative: 7.5 %
Neutro Abs: 3102 cells/uL (ref 1500–7800)
Neutrophils Relative %: 63.3 %
Platelets: 210 10*3/uL (ref 140–400)
RBC: 3.99 10*6/uL (ref 3.80–5.10)
RDW: 13 % (ref 11.0–15.0)
Total Lymphocyte: 23.1 %
WBC: 4.9 10*3/uL (ref 3.8–10.8)

## 2021-05-11 LAB — COMPREHENSIVE METABOLIC PANEL
AG Ratio: 2.1 (calc) (ref 1.0–2.5)
ALT: 20 U/L (ref 6–29)
AST: 17 U/L (ref 10–35)
Albumin: 4.4 g/dL (ref 3.6–5.1)
Alkaline phosphatase (APISO): 64 U/L (ref 37–153)
BUN: 19 mg/dL (ref 7–25)
CO2: 28 mmol/L (ref 20–32)
Calcium: 9.4 mg/dL (ref 8.6–10.4)
Chloride: 106 mmol/L (ref 98–110)
Creat: 0.87 mg/dL (ref 0.60–1.00)
Globulin: 2.1 g/dL (calc) (ref 1.9–3.7)
Glucose, Bld: 131 mg/dL — ABNORMAL HIGH (ref 65–99)
Potassium: 3.7 mmol/L (ref 3.5–5.3)
Sodium: 142 mmol/L (ref 135–146)
Total Bilirubin: 0.7 mg/dL (ref 0.2–1.2)
Total Protein: 6.5 g/dL (ref 6.1–8.1)

## 2021-05-11 LAB — TSH: TSH: 2.8 mIU/L (ref 0.40–4.50)

## 2021-05-11 LAB — B12 AND FOLATE PANEL
Folate: >24 ng/mL
Vitamin B-12: 394 pg/mL (ref 200–1100)

## 2021-05-11 MED ORDER — PANTOPRAZOLE SODIUM 40 MG PO TBEC: 40.0000 mg | DELAYED_RELEASE_TABLET | Freq: Every day | ORAL | 1 refills | Status: DC

## 2021-05-11 NOTE — Progress Notes (Signed)
Subjective:    Patient ID: Kristen Strickland, female    DOB: 07-12-1946, 75 y.o.   MRN: [id number]  CC: Kristen Strickland is a 75 y.o. female who presents today for an acute visit.    HPI: Complains of epigastric abdominal pain to above the navel . Pressure also radiates to under bilateral breasts. This symptom has been ongoing for 2 years, unchanged Pain is triggered when laying supine night. No pain today.  Pain interferes with sleep. She endorses fatigue.   She tries to get into different positions without relief. Last for 5 -10 minutes and then goes away and then may return an hour later.  She is here today as not a 'pain' , but adamant that symptom is a pressure.   Takes tylenol arthritis daily without relief.   No abdominal or left arm numbness, diarrhea, constipation, dysuria, burping, fever, rash, sob, cough.  Takes prilosec '20mg'$  in the morning with breakfast. 2 weeks ago had bout of nausea after dinner and took tums with resolve.   Eats dinner at 6pm and gets in bed 730pm.   NO NSAIDS.   Typical day she is taking care of grandchildren and cooks. Washing clothes and work in yard without abdominal or back pain.      Follows with Dr [facility] in [place].   She is compliant with gabapentin '100mg'$  tid for peripheral neuropathy for bilateral legs.   Recent consult [facility]ology Dr Flonnie Overman in April of this year and suspected osteoarthritis has not attacked inflammatory arthritis.  Advised Tylenol  H/o hysterectomy, cervical decompression, lumbar fusion 11/10/18  MRI lumbar - 12/2019-which showed previous anterior discectomy and fusion procedure L 5 to S1.  Bilateral facet osteoarthritis with edema and enhancement, anterolisthesis  Colonoscopy reported 03/04/2014   HISTORY:  Past Medical History:  Diagnosis Date   Arthritis    Carotid arterial disease ([place])    a. 11/2017 Carotid U/S: RICA 123456, LICA 123456.    Chronic fatigue    Chronic leg pain    DDD  (degenerative disc disease), lumbar    Dyspnea on exertion    a. 02/2017 Echo: EF 60-65%, no rwma, mild MR. Nl RV size/fxn. Nl PASP; b. 02/2017 MV: small, mild, fixed basal inferolateral defect - ? artifact vs scar. No ischemia. EF >65%.   Hypercholesterolemia    Hypertension    Insomnia    Palpitations    a. 02/2017 Holter: Avg HR 66 (51-115), rare PACs, four brief runs of PAT - up to 5 beats, max rate 157. Rare isolated PVCs. No sustained arrhythmias; b. 03/2018 24h Holter: Rare PACs, 3 beat run PAT (122 bpm), PVCs (2% of beats).   Radiculopathy    Lumbar region   Sinus bradycardia    Spondylolisthesis    lumbosacral region   Past Surgical History:  Procedure Laterality Date   ABDOMINAL EXPOSURE N/A 11/10/2018   Procedure: ABDOMINAL EXPOSURE;  Surgeon: Angelia Mould, MD;  Location: [facility] OR;  Service: Vascular;  Laterality: N/A;   [place] DECOMP/DISCECTOMY FUSION N/A 04/09/2013   Procedure: ANTERIOR CERVICAL DECOMPRESSION/DISCECTOMY FUSION 2 LEVELS;  Surgeon: Floyce Stakes, MD;  Location: [facility];  Service: Neurosurgery;  Laterality: N/A;  Cervical five-six Cervical six-seven  Anterior cervical decompression/diskectomy/fusion   ANTERIOR LUMBAR FUSION N/A 11/10/2018   Procedure: Anterior Lumbar Interbody Fusion-Lumbar five-Sacral one;  Surgeon: Earnie Larsson, MD;  Location: [place];  Service: Neurosurgery;  Laterality: N/A;   BACK SURGERY  ruptured disc   RECONSTRUCTION OF NOSE     Family History  Problem Relation Age of Onset   Cancer Mother        ovarian/uterine   Heart disease Mother    Heart attack Mother    Stroke Mother    Colon cancer Father    Dementia Father    COPD Brother    Congestive Heart Failure Brother    Stroke Daughter    Heart attack Daughter    Suicidality Brother    Breast cancer Neg Hx     Allergies: Patient has no known allergies. Current Outpatient Medications on File Prior to Visit  Medication Sig  Dispense Refill   amLODipine (NORVASC) 5 MG tablet TAKE 1 TABLET BY MOUTH ONCE DAILY. 90 tablet 2   aspirin EC 81 MG tablet Take 81 mg by mouth daily.     gabapentin (NEURONTIN) 100 MG capsule Take 100 mg by mouth 3 (three) times daily.     losartan-hydrochlorothiazide (HYZAAR) 100-25 MG tablet TAKE 1 TABLET BY MOUTH ONCE DAILY. 30 tablet 11   Multiple Vitamins-Minerals (CENTRUM PO) Take 1 tablet by mouth daily.      rosuvastatin (CRESTOR) 20 MG tablet TAKE 1 TABLET BY MOUTH ONCE DAILY FOR CHOLESTEROL 90 tablet 2   No current facility-administered medications on file prior to visit.    Social History   Tobacco Use   Smoking status: Never   Smokeless tobacco: Never  Vaping Use   Vaping Use: Never used  Substance Use Topics   Alcohol use: Not Currently    Alcohol/week: 0.0 standard drinks    Comment: rarely   Drug use: No    Review of Systems  Constitutional:  Negative for chills and fever.  Respiratory:  Negative for cough.   Cardiovascular:  Negative for chest pain and palpitations.  Gastrointestinal:  Positive for abdominal pain and nausea. Negative for vomiting.  Neurological:  Negative for numbness.     Objective:    BP (!) 142/62 (BP Location: Left Arm, Patient Position: Sitting, Cuff Size: Large)   Pulse 67   Temp 98.2 F (36.8 C) (Oral)   Ht '5\' 4"'$  (1.626 m)   Wt 142 lb (64.4 kg)   SpO2 99%   BMI 24.37 kg/m    Physical Exam Vitals reviewed.  Constitutional:      Appearance: Normal appearance. She is well-developed.  Eyes:     Conjunctiva/sclera: Conjunctivae normal.  Cardiovascular:     Rate and Rhythm: Normal rate and regular rhythm.     Pulses: Normal pulses.     Heart sounds: Normal heart sounds.  Pulmonary:     Effort: Pulmonary effort is normal.     Breath sounds: Normal breath sounds. No wheezing, rhonchi or rales.  Abdominal:     General: Bowel sounds are normal. There is no distension.     Palpations: Abdomen is soft. Abdomen is not rigid.  There is no fluid wave or mass.     Tenderness: There is no abdominal tenderness. There is no guarding or rebound.  Skin:    General: Skin is warm and dry.  Neurological:     Mental Status: She is alert.  Psychiatric:        Speech: Speech normal.        Behavior: Behavior normal.        Thought Content: Thought content normal.       Assessment & Plan:   Problem List Items Addressed This Visit  Other   Abdominal pain - Primary    No abdominal pain today.  Reassuring abdominal exam.  Differentials include: GERD, cholelithiasis, MS etiology.   Trial protonix '40mg'$  qam one hour prior to breakfast for 6-8 weeks, will plan to reduce to '20mg'$  protonix.  May increase gabapentin if GI work up is negative.  CT a/b scheduled for next week.        Relevant Medications   pantoprazole (PROTONIX) 40 MG tablet   Other Relevant Orders   B12 and Folate Panel   CBC with Differential/Platelet   Comprehensive metabolic panel   TSH   CT ABDOMEN PELVIS W CONTRAST   Urine Culture   Urinalysis, Routine w reflex microscopic   Fatigue   Relevant Orders   B12 and Folate Panel   CBC with Differential/Platelet   TSH   Other Visit Diagnoses     Other general symptoms and signs        Relevant Orders   B12 and Folate Panel         I have discontinued Ona P. Pickrel's valACYclovir, traMADol, and omeprazole. I am also having her start on pantoprazole. Additionally, I am having her maintain her Multiple Vitamins-Minerals (CENTRUM PO), aspirin EC, gabapentin, losartan-hydrochlorothiazide, rosuvastatin, and amLODipine.   Meds ordered this encounter  Medications   pantoprazole (PROTONIX) 40 MG tablet    Sig: Take 1 tablet (40 mg total) by mouth daily. Take one hour prior to eating breakfast    Dispense:  30 tablet    Refill:  1    Order Specific Question:   Supervising Provider    Answer:   Crecencio Mc [[id number]]     Return precautions given.   Risks, benefits, and alternatives  of the medications and treatment plan prescribed today were discussed, and patient expressed understanding.   Education regarding symptom management and diagnosis given to patient on AVS.  Continue to follow with Einar Pheasant, MD for routine health maintenance.   Juel Burrow and I agreed with plan.   Mable Paris, FNP  I have spent 35 minutes with a patient including precharting, exam, reviewing medical records, and discussion plan of care.

## 2021-05-11 NOTE — Assessment & Plan Note (Addendum)
No abdominal pain today.  Reassuring abdominal exam.  Differentials include: GERD, cholelithiasis, MS etiology.   Trial protonix '40mg'$  qam one hour prior to breakfast for 6-8 weeks, will plan to reduce to '20mg'$  protonix.  May increase gabapentin if GI work up is negative.  CT a/b scheduled for next week.

## 2021-05-11 NOTE — Telephone Encounter (Signed)
Lft pt vm to call ofc to get CT appt. thanks

## 2021-05-11 NOTE — Patient Instructions (Addendum)
Stop prilosec  Start protonix 40 mg and take 1 hour prior to breakfast  Do not eat a meal and lay down unless you have waited a full 2 hours.   Ct of abdomen  Let us know if you dont hear back within a week in regards to an appointment being scheduled.   Gastroesophageal Reflux Disease, Adult  Gastroesophageal reflux (GER) happens when acid from the stomach flows up into the tube that connects the mouth and the stomach (esophagus). Normally, food travels down the esophagus and stays in the stomach to be digested. With GER, food and stomach acid sometimes move back up into theesophagus. You may have a disease called gastroesophageal reflux disease (GERD) if the reflux: Happens often. Causes frequent or very bad symptoms. Causes problems such as damage to the esophagus. When this happens, the esophagus becomes sore and swollen. Over time, GERD can make small holes (ulcers) in the lining of the esophagus. What are the causes? This condition is caused by a problem with the muscle between the esophagus and the stomach. When this muscle is weak or not normal, it does not close properlyto keep food and acid from coming back up from the stomach. The muscle can be weak because of: Tobacco use. Pregnancy. Having a certain type of hernia (hiatal hernia). Alcohol use. Certain foods and drinks, such as coffee, chocolate, onions, and peppermint. What increases the risk? Being overweight. Having a disease that affects your connective tissue. Taking NSAIDs, such a ibuprofen. What are the signs or symptoms? Heartburn. Difficult or painful swallowing. The feeling of having a lump in the throat. A bitter taste in the mouth. Bad breath. Having a lot of saliva. Having an upset or bloated stomach. Burping. Chest pain. Different conditions can cause chest pain. Make sure you see your doctor if you have chest pain. Shortness of breath or wheezing. A long-term cough or a cough at night. Wearing away  of the surface of teeth (tooth enamel). Weight loss. How is this treated? Making changes to your diet. Taking medicine. Having surgery. Treatment will depend on how bad your symptoms are. Follow these instructions at home: Eating and drinking  Follow a diet as told by your doctor. You may need to avoid foods and drinks such as: Coffee and tea, with or without caffeine. Drinks that contain alcohol. Energy drinks and sports drinks. Bubbly (carbonated) drinks or sodas. Chocolate and cocoa. Peppermint and mint flavorings. Garlic and onions. Horseradish. Spicy and acidic foods. These include peppers, chili powder, curry powder, vinegar, hot sauces, and BBQ sauce. Citrus fruit juices and citrus fruits, such as oranges, lemons, and limes. Tomato-based foods. These include red sauce, chili, salsa, and pizza with red sauce. Fried and fatty foods. These include donuts, french fries, potato chips, and high-fat dressings. High-fat meats. These include hot dogs, rib eye steak, sausage, ham, and bacon. High-fat dairy items, such as whole milk, butter, and cream cheese. Eat small meals often. Avoid eating large meals. Avoid drinking large amounts of liquid with your meals. Avoid eating meals during the 2-3 hours before bedtime. Avoid lying down right after you eat. Do not exercise right after you eat.  Lifestyle  Do not smoke or use any products that contain nicotine or tobacco. If you need help quitting, ask your doctor. Try to lower your stress. If you need help doing this, ask your doctor. If you are overweight, lose an amount of weight that is healthy for you. Ask your doctor about a safe weight loss  goal.  General instructions Pay attention to any changes in your symptoms. Take over-the-counter and prescription medicines only as told by your doctor. Do not take aspirin, ibuprofen, or other NSAIDs unless your doctor says it is okay. Wear loose clothes. Do not wear anything tight around  your waist. Raise (elevate) the head of your bed about 6 inches (15 cm). You may need to use a wedge to do this. Avoid bending over if this makes your symptoms worse. Keep all follow-up visits. Contact a doctor if: You have new symptoms. You lose weight and you do not know why. You have trouble swallowing or it hurts to swallow. You have wheezing or a cough that keeps happening. You have a hoarse voice. Your symptoms do not get better with treatment. Get help right away if: You have sudden pain in your arms, neck, jaw, teeth, or back. You suddenly feel sweaty, dizzy, or light-headed. You have chest pain or shortness of breath. You vomit and the vomit is green, yellow, or black, or it looks like blood or coffee grounds. You faint. Your poop (stool) is red, bloody, or black. You cannot swallow, drink, or eat. These symptoms may represent a serious problem that is an emergency. Do not wait to see if the symptoms will go away. Get medical help right away. Call your local emergency services (911 in the [place] Do not drive yourself to the hospital. Summary If a person has gastroesophageal reflux disease (GERD), food and stomach acid move back up into the esophagus and cause symptoms or problems such as damage to the esophagus. Treatment will depend on how bad your symptoms are. Follow a diet as told by your doctor. Take all medicines only as told by your doctor. This information is not intended to replace advice given to you by your health care provider. Make sure you discuss any questions you have with your healthcare provider. Document Revised: 04/17/2020 Document Reviewed: 04/17/2020 Elsevier Patient Education  [employer].

## 2021-05-12 LAB — URINE CULTURE
MICRO NUMBER:: 12152432
Result:: NO GROWTH
SPECIMEN QUALITY:: ADEQUATE

## 2021-05-12 LAB — URINALYSIS, ROUTINE W REFLEX MICROSCOPIC
Bacteria, UA: NONE SEEN /HPF
Bilirubin Urine: NEGATIVE
Glucose, UA: NEGATIVE
Hgb urine dipstick: NEGATIVE
Hyaline Cast: NONE SEEN /LPF
Ketones, ur: NEGATIVE
Nitrite: NEGATIVE
Protein, ur: NEGATIVE
RBC / HPF: NONE SEEN /HPF (ref 0–2)
Specific Gravity, Urine: 1.01 (ref 1.001–1.035)
Squamous Epithelial / HPF: NONE SEEN /HPF (ref <=5)
WBC, UA: NONE SEEN /HPF (ref 0–5)
pH: 6 (ref 5.0–8.0)

## 2021-05-12 LAB — MICROSCOPIC MESSAGE

## 2021-05-14 ENCOUNTER — Telehealth: Payer: Self-pay

## 2021-05-14 NOTE — Telephone Encounter (Signed)
Pt would like to know what the address is of where she is getting the CT of her abdomen. Pt is okay with you leaving detailed message on answering machine.

## 2021-05-16 ENCOUNTER — Telehealth: Payer: Self-pay

## 2021-05-16 ENCOUNTER — Ambulatory Visit
Admission: RE | Admit: 2021-05-16 | Discharge: 2021-05-16 | Disposition: A | Discharge status: Home / Self Care | Payer: Medicare Other | Source: Ambulatory Visit | Attending: Family | Admitting: Family

## 2021-05-16 ENCOUNTER — Other Ambulatory Visit: Payer: Self-pay

## 2021-05-16 DIAGNOSIS — R109 Unspecified abdominal pain: Secondary | ICD-10-CM | POA: Diagnosis not present

## 2021-05-16 DIAGNOSIS — R1084 Generalized abdominal pain: Secondary | ICD-10-CM | POA: Insufficient documentation

## 2021-05-16 MED ORDER — IOHEXOL 300 MG/ML  SOLN
100.0000 mL | Freq: Once | INTRAMUSCULAR | Status: AC | PRN
  Administered 2021-05-16: 100 mL via INTRAVENOUS

## 2021-05-16 NOTE — Telephone Encounter (Signed)
Pt called about Ct pelvis and abdomen results.

## 2021-05-16 NOTE — Telephone Encounter (Signed)
These have not yet resulted.

## 2021-05-17 NOTE — Telephone Encounter (Signed)
Pt called to get results.  °

## 2021-05-17 NOTE — Telephone Encounter (Signed)
I called patient & let her know that CT has not been resulted to be & could see report though. I let her know to give her peace of mind I didn't see any abnormalities that were acute. Main thing noted was a 1cm gallstone. I told her as soon as they were resulted that I would call her.

## 2021-05-18 ENCOUNTER — Encounter: Payer: Self-pay | Admitting: Family

## 2021-05-18 DIAGNOSIS — I700 Atherosclerosis of aorta: Secondary | ICD-10-CM | POA: Insufficient documentation

## 2021-05-18 NOTE — Telephone Encounter (Signed)
You are very correct Please see result note Thank you!

## 2021-05-18 NOTE — Telephone Encounter (Signed)
LMTCB

## 2021-05-18 NOTE — Telephone Encounter (Signed)
Patient called with results.

## 2021-05-18 NOTE — Telephone Encounter (Signed)
LM once again to call back.

## 2021-05-23 ENCOUNTER — Telehealth: Payer: Self-pay | Admitting: Internal Medicine

## 2021-05-23 DIAGNOSIS — R1084 Generalized abdominal pain: Secondary | ICD-10-CM

## 2021-05-23 NOTE — Telephone Encounter (Signed)
Pt wanted a call back to discuss the pantoprazole (PROTONIX) 40 MG tablet that Joycelyn Schmid put her on

## 2021-05-24 IMAGING — US US EXTREM LOW VENOUS*L*
1 series · 13 of 24 positions shown · non-contrast
Comparison: None.

CLINICAL DATA: 74-year-old female with left leg swelling



[Series 1: us extrem low venous*left* · 0.07mm/px · 13 of 31 slices shown]
[im 1/31]
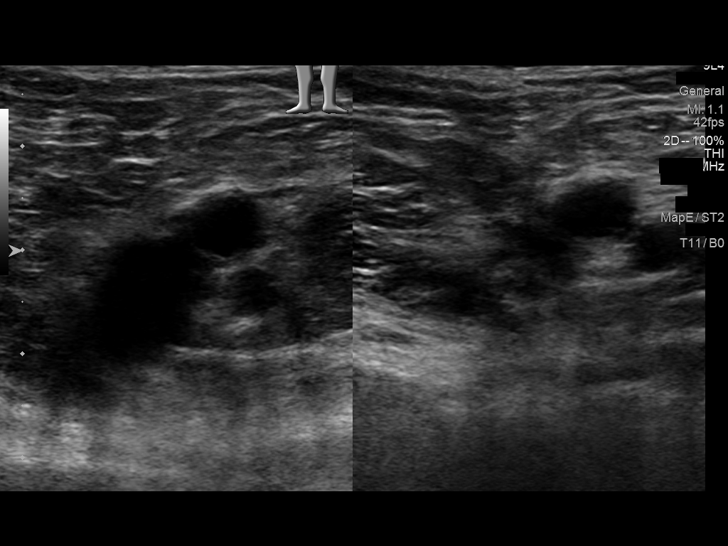
[im 3/31]
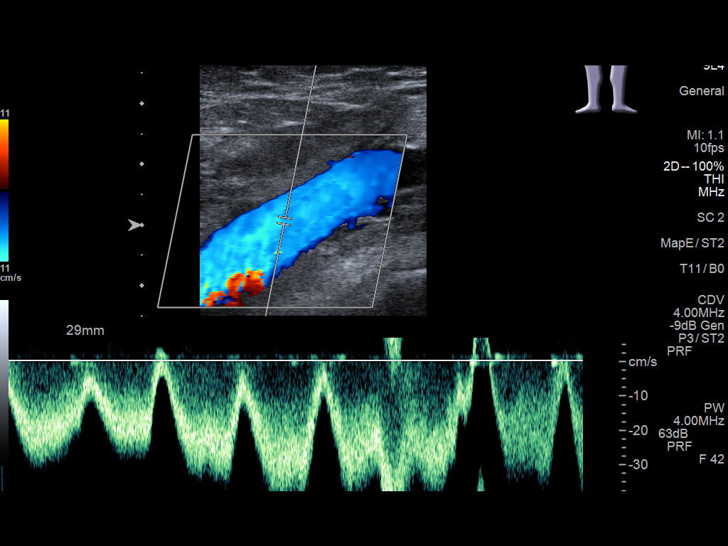
[im 6/31]
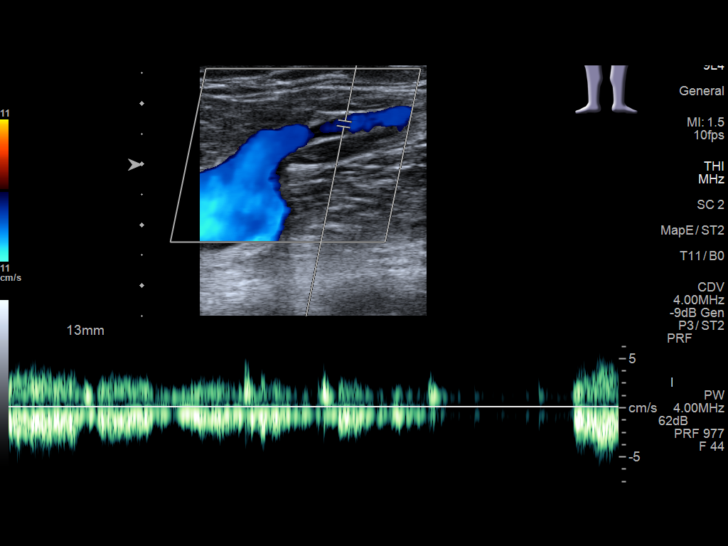
[im 8/31]
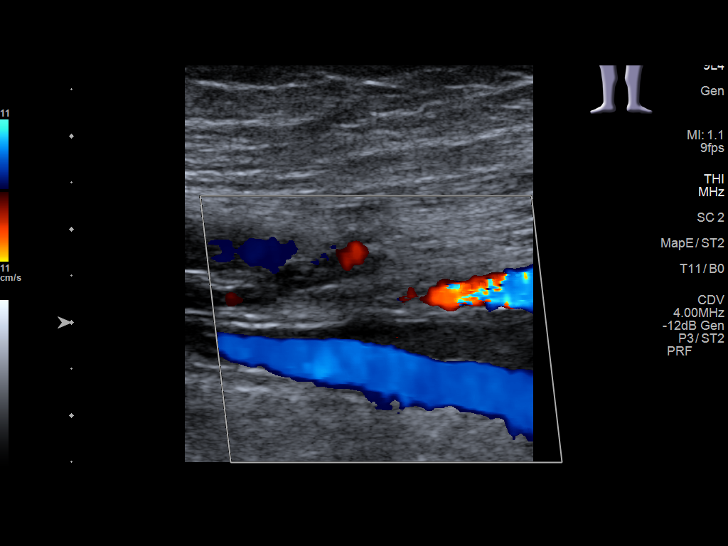
[im 11/31]
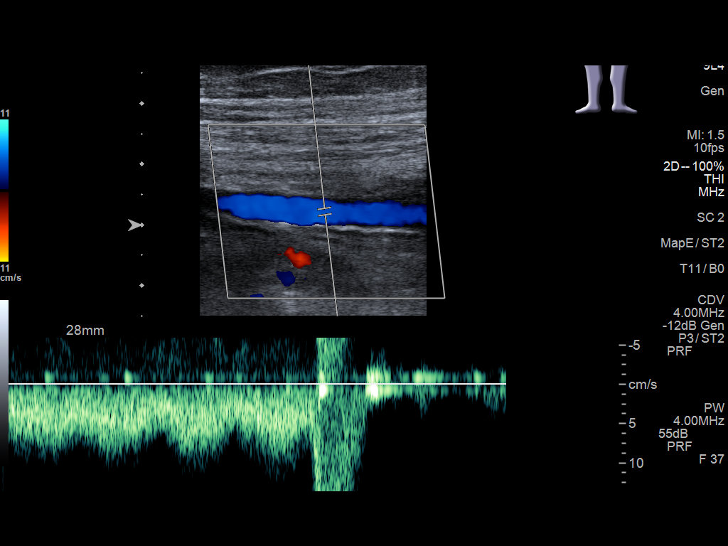
[im 14/31]
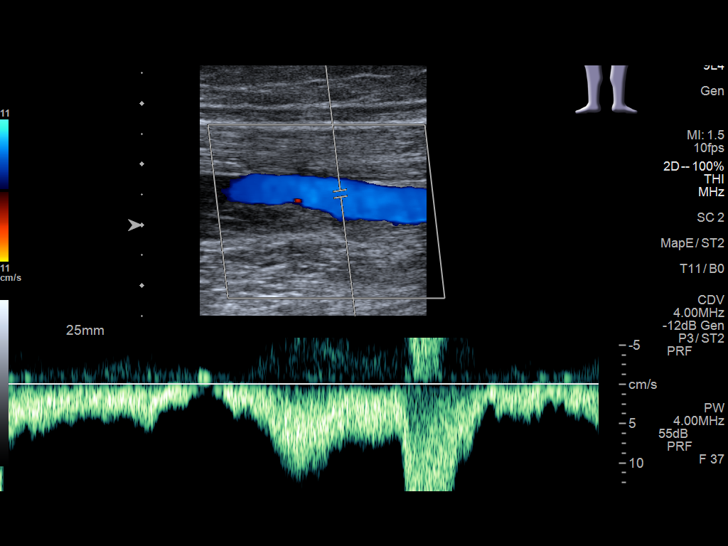
[im 16/31]
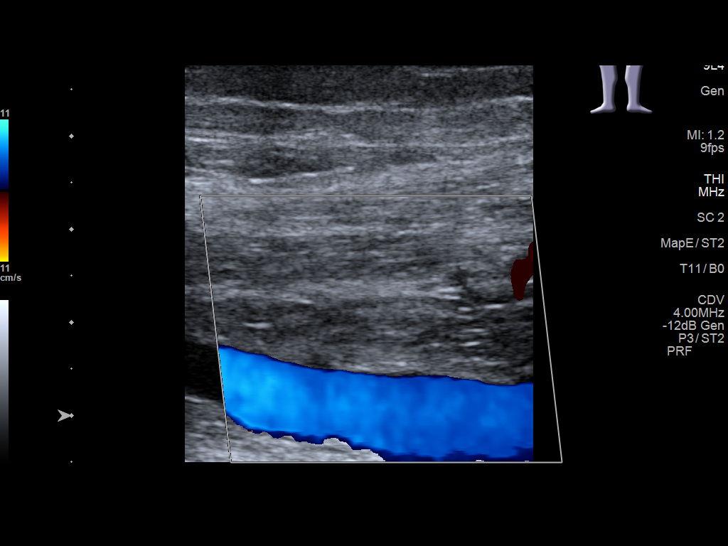
[im 17/31]
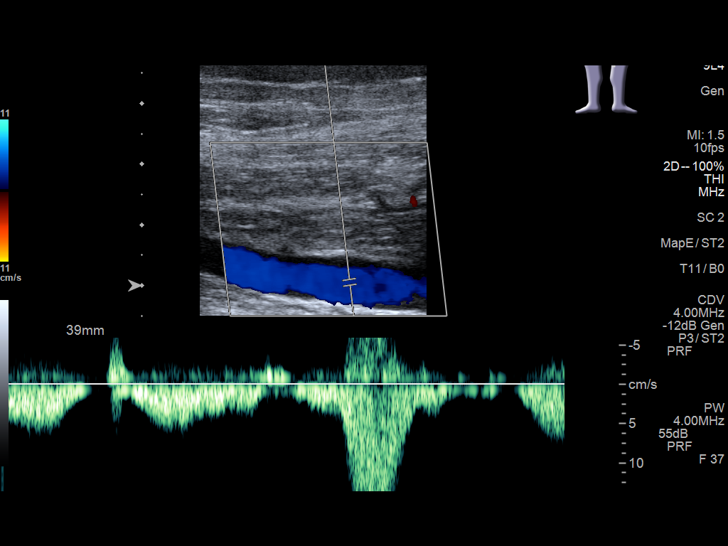
[im 20/31]
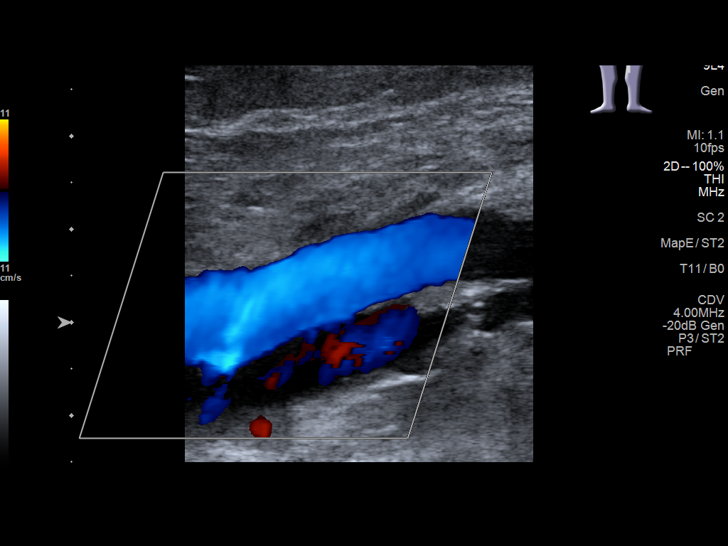
[im 23/31]
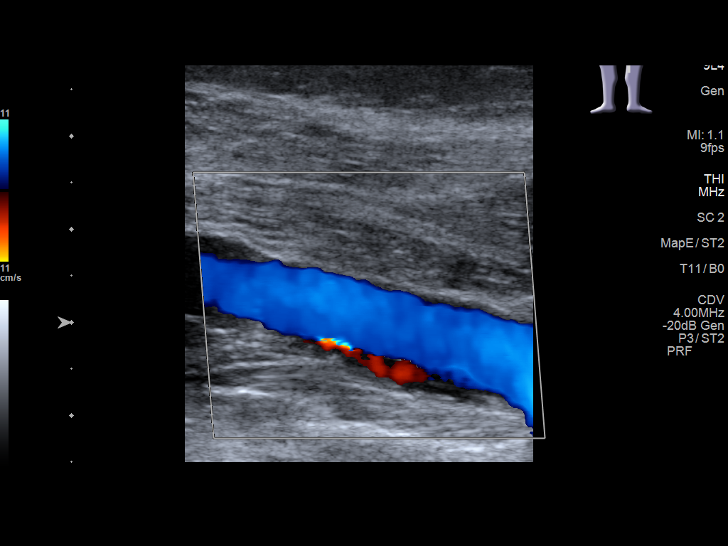
[im 25/31]
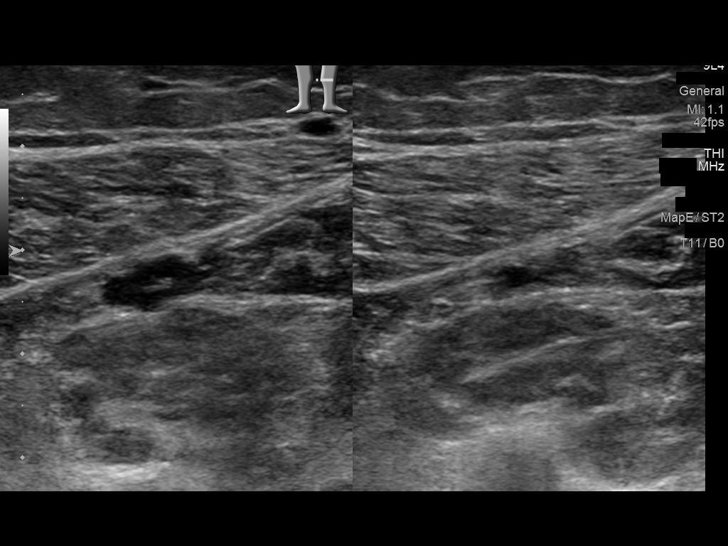
[im 28/31]
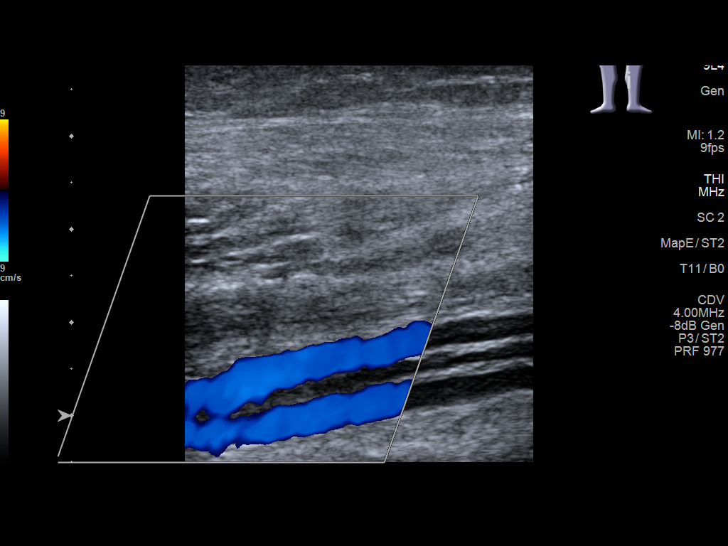
[im 31/31]
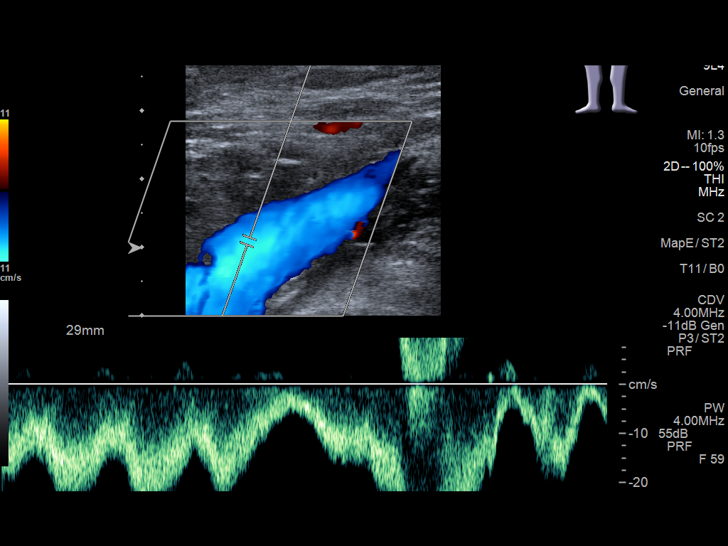

[13 of 24 positions shown; findings below may reference images not displayed]

FINDINGS: Contralateral Common Femoral Vein: Respiratory phasicity is normal
and symmetric with the symptomatic side. No evidence of thrombus.
Normal compressibility.

Common Femoral Vein: No evidence of thrombus. Normal
compressibility, respiratory phasicity and response to augmentation.

Saphenofemoral Junction: No evidence of thrombus. Normal
compressibility and flow on color Doppler imaging.

Profunda Femoral Vein: No evidence of thrombus. Normal
compressibility and flow on color Doppler imaging.

Femoral Vein: No evidence of thrombus. Normal compressibility,
respiratory phasicity and response to augmentation.

Popliteal Vein: No evidence of thrombus. Normal compressibility,
respiratory phasicity and response to augmentation.

Calf Veins: No evidence of thrombus. Normal compressibility and flow
on color Doppler imaging.

Superficial Great Saphenous Vein: No evidence of thrombus. Normal
compressibility and flow on color Doppler imaging.

Other Findings: Lentiform anechoic fluid collection in the popliteal
region measures 2.0 cm x 1.2 cm x 0.5 cm
IMPRESSION: Sonographic survey of the left lower extremity negative for DVT

Small Baker's cyst.

## 2021-05-24 NOTE — Telephone Encounter (Signed)
LMTCB

## 2021-05-25 NOTE — Telephone Encounter (Signed)
Pt called and stated that her pain has actually gotten better she feels from possibly taking the protonix 40 mg once a day. She wanted to know should she stay on this? Pain is there still, but again jot severe like it has been previously. She also said that she was okay with referral to Dr. Vicente Males at [facility] for evaluation of gallstone.

## 2021-05-26 NOTE — Addendum Note (Signed)
Addended by: Alisa Graff on: 05/26/2021 12:44 PM   Modules accepted: Orders

## 2021-05-26 NOTE — Telephone Encounter (Signed)
Yes, recommend continuing protonix - since helping.  Also, order placed for referral.

## 2021-05-28 NOTE — Telephone Encounter (Signed)
Patient called and advised on below. She will let us know if she does not hear in 7-10 business days in regards to appointment with GI.

## 2021-05-28 NOTE — Telephone Encounter (Signed)
Ref to dr Vicente Males done by dr Nicki Reaper

## 2021-06-07 ENCOUNTER — Other Ambulatory Visit: Payer: Self-pay | Admitting: Family

## 2021-06-07 DIAGNOSIS — R1084 Generalized abdominal pain: Secondary | ICD-10-CM

## 2021-06-12 ENCOUNTER — Ambulatory Visit (INDEPENDENT_AMBULATORY_CARE_PROVIDER_SITE_OTHER): Payer: Medicare Other | Admitting: Vascular Surgery

## 2021-06-12 ENCOUNTER — Encounter (INDEPENDENT_AMBULATORY_CARE_PROVIDER_SITE_OTHER): Payer: Medicare Other

## 2021-06-26 ENCOUNTER — Other Ambulatory Visit (INDEPENDENT_AMBULATORY_CARE_PROVIDER_SITE_OTHER): Payer: Self-pay | Admitting: Vascular Surgery

## 2021-06-26 DIAGNOSIS — I6523 Occlusion and stenosis of bilateral carotid arteries: Secondary | ICD-10-CM

## 2021-06-28 ENCOUNTER — Ambulatory Visit (INDEPENDENT_AMBULATORY_CARE_PROVIDER_SITE_OTHER): Payer: Medicare Other | Admitting: Internal Medicine

## 2021-06-28 ENCOUNTER — Other Ambulatory Visit: Payer: Self-pay

## 2021-06-28 ENCOUNTER — Encounter: Payer: Self-pay | Admitting: Internal Medicine

## 2021-06-28 DIAGNOSIS — F439 Reaction to severe stress, unspecified: Secondary | ICD-10-CM | POA: Diagnosis not present

## 2021-06-28 DIAGNOSIS — I700 Atherosclerosis of aorta: Secondary | ICD-10-CM | POA: Diagnosis not present

## 2021-06-28 DIAGNOSIS — E785 Hyperlipidemia, unspecified: Secondary | ICD-10-CM | POA: Diagnosis not present

## 2021-06-28 DIAGNOSIS — R739 Hyperglycemia, unspecified: Secondary | ICD-10-CM | POA: Diagnosis not present

## 2021-06-28 DIAGNOSIS — R1084 Generalized abdominal pain: Secondary | ICD-10-CM | POA: Diagnosis not present

## 2021-06-28 DIAGNOSIS — I10 Essential (primary) hypertension: Secondary | ICD-10-CM | POA: Diagnosis not present

## 2021-06-28 DIAGNOSIS — I6523 Occlusion and stenosis of bilateral carotid arteries: Secondary | ICD-10-CM

## 2021-06-28 DIAGNOSIS — R002 Palpitations: Secondary | ICD-10-CM

## 2021-06-28 LAB — LIPID PANEL
Cholesterol: 169 mg/dL (ref 0–200)
HDL: 56.6 mg/dL (ref >39.00)
LDL Cholesterol: 87 mg/dL (ref 0–99)
NonHDL: 112.7
Total CHOL/HDL Ratio: 3
Triglycerides: 128 mg/dL (ref 0.0–149.0)
VLDL: 25.6 mg/dL (ref 0.0–40.0)

## 2021-06-28 LAB — HEPATIC FUNCTION PANEL
ALT: 18 U/L (ref 0–35)
AST: 17 U/L (ref 0–37)
Albumin: 4.3 g/dL (ref 3.5–5.2)
Alkaline Phosphatase: 69 U/L (ref 39–117)
Bilirubin, Direct: 0.1 mg/dL (ref 0.0–0.3)
Total Bilirubin: 0.9 mg/dL (ref 0.2–1.2)
Total Protein: 7 g/dL (ref 6.0–8.3)

## 2021-06-28 LAB — BASIC METABOLIC PANEL
BUN: 17 mg/dL (ref 6–23)
CO2: 29 mEq/L (ref 19–32)
Calcium: 9.7 mg/dL (ref 8.4–10.5)
Chloride: 103 mEq/L (ref 96–112)
Creatinine, Ser: 0.75 mg/dL (ref 0.40–1.20)
GFR: 78.56 mL/min (ref >60.00)
Glucose, Bld: 96 mg/dL (ref 70–99)
Potassium: 4 mEq/L (ref 3.5–5.1)
Sodium: 140 mEq/L (ref 135–145)

## 2021-06-28 LAB — HEMOGLOBIN A1C: Hgb A1c MFr Bld: 6 % (ref 4.6–6.5)

## 2021-06-28 NOTE — Progress Notes (Signed)
Patient ID: DANYLE BOENING, female   DOB: [birth year], 75 y.o.   MRN: [id number]   Subjective:    Patient ID: MELADY CHOW, female    DOB: May 01, 1946, 75 y.o.   MRN: [id number]  This visit occurred during the SARS-CoV-2 public health emergency.  Safety protocols were in place, including screening questions prior to the visit, additional usage of staff PPE, and extensive cleaning of exam room while observing appropriate contact time as indicated for disinfecting solutions.   Patient here for scheduled follow up.  Marland Kitchen   HPI  F/u regarding abdominal pain.  Saw Mable Paris 05/14/21 for abdominal pain.  CT ordered - one centimeter gallstone, no acute abnormality.  She has had persistent intermittent abdominal pain.  States occurs at night (reports every night).  Some nights not as bad.  Some decreased appetite.  Decrease taste.  Occasional nausea.  Chronic loose stool.  No vomiting.  Has back issues.  Due to see Dr Trenton Gammon next week.  Some occasional heart fluttering.  No chest pain.  Breathing stable.    Past Medical History:  Diagnosis Date   Arthritis    Carotid arterial disease ([place])    a. 11/2017 Carotid U/S: RICA 9-93%, LICA 57-01%.    Chronic fatigue    Chronic leg pain    DDD (degenerative disc disease), lumbar    Dyspnea on exertion    a. 02/2017 Echo: EF 60-65%, no rwma, mild MR. Nl RV size/fxn. Nl PASP; b. 02/2017 MV: small, mild, fixed basal inferolateral defect - ? artifact vs scar. No ischemia. EF >65%.   Hypercholesterolemia    Hypertension    Insomnia    Palpitations    a. 02/2017 Holter: Avg HR 66 (51-115), rare PACs, four brief runs of PAT - up to 5 beats, max rate 157. Rare isolated PVCs. No sustained arrhythmias; b. 03/2018 24h Holter: Rare PACs, 3 beat run PAT (122 bpm), PVCs (2% of beats).   Radiculopathy    Lumbar region   Sinus bradycardia    Spondylolisthesis    lumbosacral region   Past Surgical History:  Procedure Laterality Date   ABDOMINAL EXPOSURE N/A 11/10/2018    Procedure: ABDOMINAL EXPOSURE;  Surgeon: Angelia Mould, MD;  Location: [facility] OR;  Service: Vascular;  Laterality: N/A;   [place] U DECOMP/DISCECTOMY FUSION N/A 04/09/2013   Procedure: ANTERIOR CERVICAL DECOMPRESSION/DISCECTOMY FUSION 2 LEVELS;  Surgeon: Floyce Stakes, MD;  Location: [facility];  Service: Neurosurgery;  Laterality: N/A;  Cervical five-six Cervical six-seven  Anterior cervical decompression/diskectomy/fusion   ANTERIOR LUMBAR FUSION N/A 11/10/2018   Procedure: Anterior Lumbar Interbody Fusion-Lumbar five-Sacral one;  Surgeon: Earnie Larsson, MD;  Location: [facility];  Service: Neurosurgery;  Laterality: N/A;   BACK SURGERY     ruptured disc   RECONSTRUCTION OF NOSE     Family History  Problem Relation Age of Onset   Cancer Mother        ovarian/uterine   Heart disease Mother    Heart attack Mother    Stroke Mother    Colon cancer Father    Dementia Father    COPD Brother    Congestive Heart Failure Brother    Stroke Daughter    Heart attack Daughter    Suicidality Brother    Breast cancer Neg Hx    Social History   Socioeconomic History   Marital status: Married    Spouse name: Not on file   Number of children:  2   Years of education: Not on file   Highest education level: Not on file  Occupational History   Not on file  Tobacco Use   Smoking status: Never   Smokeless tobacco: Never  Vaping Use   Vaping Use: Never used  Substance and Sexual Activity   Alcohol use: Not Currently    Alcohol/week: 0.0 standard drinks    Comment: rarely   Drug use: No   Sexual activity: Yes  Other Topics Concern   Not on file  Social History Narrative   Not on file   Social Determinants of Health   Financial Resource Strain: Low Risk    Difficulty of Paying Living Expenses: Not hard at all  Food Insecurity: No Food Insecurity   Worried About Charity fundraiser in the Last Year: Never true   Ran Out of Food in the Last  Year: Never true  Transportation Needs: No Transportation Needs   Lack of Transportation (Medical): No   Lack of Transportation (Non-Medical): No  Physical Activity: Sufficiently Active   Days of Exercise per Week: 5 days   Minutes of Exercise per Session: 30 min  Stress: No Stress Concern Present   Feeling of Stress : Not at all  Social Connections: Unknown   Frequency of Communication with Friends and Family: More than three times a week   Frequency of Social Gatherings with Friends and Family: More than three times a week   Attends Religious Services: Not on Electrical engineer or Organizations: Not on file   Attends Archivist Meetings: Not on file   Marital Status: Not on file    Review of Systems  Constitutional:  Negative for appetite change and unexpected weight change.  HENT:  Negative for congestion and sinus pressure.   Respiratory:  Negative for cough, chest tightness and shortness of breath.   Cardiovascular:  Negative for chest pain, palpitations and leg swelling.  Gastrointestinal:  Positive for abdominal pain and nausea. Negative for vomiting.  Genitourinary:  Negative for difficulty urinating and dysuria.  Musculoskeletal:  Negative for joint swelling and myalgias.  Skin:  Negative for color change and rash.  Neurological:  Negative for dizziness, light-headedness and headaches.  Psychiatric/Behavioral:  Negative for agitation and dysphoric mood.       Objective:     BP 128/78   Pulse (!) 58   Temp (!) 97.5 F (36.4 C)   Ht 5' 4.02" (1.626 m)   Wt 139 lb 12.8 oz (63.4 kg)   SpO2 98%   BMI 23.98 kg/m  Wt Readings from Last 3 Encounters:  06/28/21 139 lb 12.8 oz (63.4 kg)  05/11/21 142 lb (64.4 kg)  04/18/21 138 lb (62.6 kg)    Physical Exam Vitals reviewed.  Constitutional:      General: She is not in acute distress.    Appearance: Normal appearance.  HENT:     Head: Normocephalic and atraumatic.     Right Ear: External ear  normal.     Left Ear: External ear normal.  Eyes:     General: No scleral icterus.       Right eye: No discharge.        Left eye: No discharge.     Conjunctiva/sclera: Conjunctivae normal.  Neck:     Thyroid: No thyromegaly.  Cardiovascular:     Rate and Rhythm: Normal rate and regular rhythm.  Pulmonary:     Effort: No respiratory distress.  Breath sounds: Normal breath sounds. No wheezing.  Abdominal:     General: Bowel sounds are normal.     Palpations: Abdomen is soft.     Comments: No significant abdominal pain to palpation.   Musculoskeletal:        General: No swelling or tenderness.     Cervical back: Neck supple. No tenderness.  Lymphadenopathy:     Cervical: No cervical adenopathy.  Skin:    Findings: No erythema or rash.  Neurological:     Mental Status: She is alert.  Psychiatric:        Mood and Affect: Mood normal.        Behavior: Behavior normal.     Outpatient Encounter Medications as of 06/28/2021  Medication Sig   amLODipine (NORVASC) 5 MG tablet TAKE 1 TABLET BY MOUTH ONCE DAILY.   aspirin EC 81 MG tablet Take 81 mg by mouth daily.   gabapentin (NEURONTIN) 100 MG capsule Take 100 mg by mouth 3 (three) times daily.   losartan-hydrochlorothiazide (HYZAAR) 100-25 MG tablet TAKE 1 TABLET BY MOUTH ONCE DAILY.   Multiple Vitamins-Minerals (CENTRUM PO) Take 1 tablet by mouth daily.    pantoprazole (PROTONIX) 40 MG tablet TAKE (1) TABLET BY MOUTH DAILY 1 HOUR BEFORE BREAKFAST.   rosuvastatin (CRESTOR) 20 MG tablet TAKE 1 TABLET BY MOUTH ONCE DAILY FOR CHOLESTEROL   No facility-administered encounter medications on file as of 06/28/2021.     Lab Results  Component Value Date   WBC 4.9 05/11/2021   HGB 11.8 05/11/2021   HCT 35.6 05/11/2021   PLT 210 05/11/2021   GLUCOSE 96 06/28/2021   CHOL 169 06/28/2021   TRIG 128.0 06/28/2021   HDL 56.60 06/28/2021   LDLDIRECT 151.7 08/16/2013   LDLCALC 87 06/28/2021   ALT 18 06/28/2021   AST 17 06/28/2021   NA  140 06/28/2021   K 4.0 06/28/2021   CL 103 06/28/2021   CREATININE 0.75 06/28/2021   BUN 17 06/28/2021   CO2 29 06/28/2021   TSH 2.80 05/11/2021   HGBA1C 6.0 06/28/2021    CT ABDOMEN PELVIS W CONTRAST  Result Date: 05/17/2021 CLINICAL DATA:  Generalized abdominal pain, epigastric pain EXAM: CT ABDOMEN AND PELVIS WITH CONTRAST TECHNIQUE: Multidetector CT imaging of the abdomen and pelvis was performed using the standard protocol following bolus administration of intravenous contrast. CONTRAST:  11m OMNIPAQUE IOHEXOL 300 MG/ML  SOLN COMPARISON:  None. FINDINGS: Lower chest: No acute abnormality. Hepatobiliary: No focal liver abnormality. There is a 1 cm gallstone. No pericholecystic fluid. No biliary dilatation. Pancreas: Unremarkable. No pancreatic ductal dilatation or surrounding inflammatory changes. Spleen: Unremarkable. Adrenals/Urinary Tract: Mild thickening of the left adrenal. Kidneys are unremarkable. Partially distended bladder is unremarkable. Stomach/Bowel: Stomach is within normal limits. Bowel is normal in caliber. Appendix is not visualized. Vascular/Lymphatic: Aortic atherosclerosis. No enlarged lymph nodes. Reproductive: Status post hysterectomy. No adnexal masses. Other: No free fluid.  Abdominal wall is unremarkable. Musculoskeletal: Postoperative changes of a anterior fusion at L5-S1. Degenerative changes of the included spine. IMPRESSION: 1 cm gallstone. No acute abnormality. Aortic Atherosclerosis (ICD10-I70.0). Electronically Signed   By: PMacy MisM.D.   On: 05/17/2021 08:40       Assessment & Plan:   Problem List Items Addressed This Visit     Abdominal pain    On protonix.  Felt this was helping some.  Persistent abdominal pain - at night.  Some nausea and decreased appetite.  CT as outined.  One cm gallstone.  No  acute inflammation.  Has appt with GI in a few weeks.  Continue protonix. Keep GI appt.  Also discussed if possible msk origin.  Has seen Dr Trenton Gammon and  has f/u next week.        Aortic atherosclerosis ([facility])    Continue crestor.       Carotid stenosis    Has f/u with vascular surgery tomorrow.  Continue risk factor modification.       Hyperglycemia    Low carb diet and exercise.  Follow met b and a1c.       Hyperlipidemia LDL goal <70    On crestor.  Low cholesterol diet and exercise.  Follow lipid panel and liver function tests.        Hypertension    Blood pressure as outlined.  Continue losartan/hctz and amlodipine.  Continue current medication regimen.  Follow pressures.  Follow metabolic panel.       Palpitations    Sees cardiology.  On coreg.  Occasional fluttering.  Discussed f/u EKG and evaluation. Has f/u soon with Dr End.  Desires no further intervention at this time.  Follow.       Stress    Increased stress.  Discussed.  Overall appears to be handling things well. Follow.         Einar Pheasant, MD

## 2021-06-29 ENCOUNTER — Ambulatory Visit (INDEPENDENT_AMBULATORY_CARE_PROVIDER_SITE_OTHER): Payer: Medicare Other

## 2021-06-29 ENCOUNTER — Ambulatory Visit (INDEPENDENT_AMBULATORY_CARE_PROVIDER_SITE_OTHER): Payer: Medicare Other | Admitting: Vascular Surgery

## 2021-06-29 DIAGNOSIS — I6523 Occlusion and stenosis of bilateral carotid arteries: Secondary | ICD-10-CM | POA: Diagnosis not present

## 2021-07-02 ENCOUNTER — Encounter: Payer: Self-pay | Admitting: Internal Medicine

## 2021-07-02 ENCOUNTER — Other Ambulatory Visit: Payer: Self-pay

## 2021-07-02 MED ORDER — LOSARTAN POTASSIUM-HCTZ 100-25 MG PO TABS: 1.0000 | ORAL_TABLET | Freq: Every day | ORAL | 11 refills | Status: DC

## 2021-07-02 NOTE — Assessment & Plan Note (Signed)
Increased stress.  Discussed.  Overall appears to be handling things well. Follow.

## 2021-07-02 NOTE — Assessment & Plan Note (Signed)
Low carb diet and exercise.  Follow met b and a1c.  

## 2021-07-02 NOTE — Progress Notes (Signed)
Left message for patient to return call back for lab results.

## 2021-07-02 NOTE — Assessment & Plan Note (Signed)
Blood pressure as outlined.  Continue losartan/hctz and amlodipine.  Continue current medication regimen.  Follow pressures.  Follow metabolic panel.

## 2021-07-02 NOTE — Assessment & Plan Note (Signed)
Has f/u with vascular surgery tomorrow.  Continue risk factor modification.

## 2021-07-02 NOTE — Assessment & Plan Note (Signed)
On crestor.  Low cholesterol diet and exercise.  Follow lipid panel and liver function tests.   

## 2021-07-02 NOTE — Assessment & Plan Note (Signed)
Sees cardiology.  On coreg.  Occasional fluttering.  Discussed f/u EKG and evaluation. Has f/u soon with Dr End.  Desires no further intervention at this time.  Follow.

## 2021-07-02 NOTE — Assessment & Plan Note (Signed)
Continue crestor 

## 2021-07-02 NOTE — Assessment & Plan Note (Signed)
On protonix.  Felt this was helping some.  Persistent abdominal pain - at night.  Some nausea and decreased appetite.  CT as outined.  One cm gallstone.  No acute inflammation.  Has appt with GI in a few weeks.  Continue protonix. Keep GI appt.  Also discussed if possible msk origin.  Has seen Dr Trenton Gammon and has f/u next week.

## 2021-07-04 ENCOUNTER — Encounter (INDEPENDENT_AMBULATORY_CARE_PROVIDER_SITE_OTHER): Payer: Self-pay | Admitting: *Deleted

## 2021-07-05 DIAGNOSIS — M5416 Radiculopathy, lumbar region: Secondary | ICD-10-CM | POA: Diagnosis not present

## 2021-07-25 NOTE — Progress Notes (Signed)
Follow-up Outpatient Visit Date: 07/26/2021  Primary Care Provider: Einar Pheasant, [place] [address] [id number]  Chief Complaint: Shortness of breath when walking up inclines  HPI:  Kristen Strickland is a 76 y.o. female with history of hypertension, hyperlipidemia, carotid artery stenosis (followed by vascular surgery), and degenerative disc disease, who presents for follow-up of shortness of breath, leg swelling, and hyperlipidemia.  I last saw her in 08/2020 at which time she reported stable exertional dyspnea and occasional transient palpitations.  She also noted left leg edema, with duplex through urgent care being negative for DVT.  Today, Kristen Strickland reports that she has been doing relatively well though she continues to have exertional dyspnea, especially when walking up an incline.  She says this has been a longstanding but seems of gotten a bit worse, particularly since her last echocardiogram and stress test in 2018.  She does not have any dyspnea at rest.  She notes an episode of severe abdominal pain that radiated into the chest recently that she felt like something "exploded."  CT scan of the abdomen revealed gallstones, which is prompted referral to GI for further evaluation.  She has not had any further chest pain, particularly with activity.  She still notes sporadic flutters at night without associated symptoms.  She has not had any lightheadedness or edema.  She is tolerating her medications well.  --------------------------------------------------------------------------------------------------  Past Medical History:  Diagnosis Date   Arthritis    Carotid arterial disease ([place])    a. 11/2017 Carotid U/S: RICA 0-26%, LICA 37-85%.    Chronic fatigue    Chronic leg pain    DDD (degenerative disc disease), lumbar    Dyspnea on exertion    a. 02/2017 Echo: EF 60-65%, no rwma, mild MR. Nl RV size/fxn. Nl PASP; b. 02/2017 MV: small, mild, fixed basal  inferolateral defect - ? artifact vs scar. No ischemia. EF >65%.   Hypercholesterolemia    Hypertension    Insomnia    Palpitations    a. 02/2017 Holter: Avg HR 66 (51-115), rare PACs, four brief runs of PAT - up to 5 beats, max rate 157. Rare isolated PVCs. No sustained arrhythmias; b. 03/2018 24h Holter: Rare PACs, 3 beat run PAT (122 bpm), PVCs (2% of beats).   Radiculopathy    Lumbar region   Sinus bradycardia    Spondylolisthesis    lumbosacral region   Past Surgical History:  Procedure Laterality Date   ABDOMINAL EXPOSURE N/A 11/10/2018   Procedure: ABDOMINAL EXPOSURE;  Surgeon: Angelia Mould, MD;  Location: [facility] OR;  Service: Vascular;  Laterality: N/A;   [place] DECOMP/DISCECTOMY FUSION N/A 04/09/2013   Procedure: ANTERIOR CERVICAL DECOMPRESSION/DISCECTOMY FUSION 2 LEVELS;  Surgeon: Floyce Stakes, MD;  Location: [facility] OR[facility];  Service: Neurosurgery;  Laterality: N/A;  Cervical five-six Cervical six-seven  Anterior cervical decompression/diskectomy/fusion   ANTERIOR LUMBAR FUSION N/A 11/10/2018   Procedure: Anterior Lumbar Interbody Fusion-Lumbar five-Sacral one;  Surgeon: Earnie Larsson, MD;  Location: [place];  Service: Neurosurgery;  Laterality: N/A;   BACK SURGERY     ruptured disc   RECONSTRUCTION OF NOSE       Current Meds  Medication Sig   amLODipine (NORVASC) 5 MG tablet TAKE 1 TABLET BY MOUTH ONCE DAILY.   aspirin EC 81 MG tablet Take 81 mg by mouth daily.   gabapentin (NEURONTIN) 100 MG capsule Take 100 mg by mouth daily.   losartan-hydrochlorothiazide (HYZAAR)  100-25 MG tablet Take 1 tablet by mouth daily.   Multiple Vitamins-Minerals (CENTRUM PO) Take 1 tablet by mouth daily.    pantoprazole (PROTONIX) 40 MG tablet TAKE (1) TABLET BY MOUTH DAILY 1 HOUR BEFORE BREAKFAST.   rosuvastatin (CRESTOR) 20 MG tablet TAKE 1 TABLET BY MOUTH ONCE DAILY FOR CHOLESTEROL    Allergies: Patient has no known allergies.  Social History    Tobacco Use   Smoking status: Never   Smokeless tobacco: Never  Vaping Use   Vaping Use: Never used  Substance Use Topics   Alcohol use: Not Currently    Alcohol/week: 0.0 standard drinks    Comment: rarely   Drug use: No    Family History  Problem Relation Age of Onset   Cancer Mother        ovarian/uterine   Heart disease Mother    Heart attack Mother    Stroke Mother    Colon cancer Father    Dementia Father    COPD Brother    Congestive Heart Failure Brother    Stroke Daughter    Heart attack Daughter    Suicidality Brother    Breast cancer Neg Hx     Review of Systems: A 12-system review of systems was performed and was negative except as noted in the HPI.  --------------------------------------------------------------------------------------------------  Physical Exam: BP 140/70 (BP Location: Left Arm, Patient Position: Sitting, Cuff Size: Normal)   Pulse (!) 54   Ht 5\' 4"  (1.626 m)   Wt 142 lb 3.2 oz (64.5 kg)   BMI 24.41 kg/m   General:  NAD. Neck: No JVD or HJR. Lungs: Clear to auscultation bilaterally without wheezes or crackles. Heart: Bradycardic but regular with occasional extrasystoles.  2/6 holosystolic murmur.  No rubs. Abdomen: Soft, nontender, nondistended. Extremities: No lower extremity edema.  EKG:  Sinus bradycardia.  No significant abnormality.  Lab Results  Component Value Date   WBC 4.9 05/11/2021   HGB 11.8 05/11/2021   HCT 35.6 05/11/2021   MCV 89.2 05/11/2021   PLT 210 05/11/2021    Lab Results  Component Value Date   NA 140 06/28/2021   K 4.0 06/28/2021   CL 103 06/28/2021   CO2 29 06/28/2021   BUN 17 06/28/2021   CREATININE 0.75 06/28/2021   GLUCOSE 96 06/28/2021   ALT 18 06/28/2021    Lab Results  Component Value Date   CHOL 169 06/28/2021   HDL 56.60 06/28/2021   LDLCALC 87 06/28/2021   LDLDIRECT 151.7 08/16/2013   TRIG 128.0 06/28/2021   CHOLHDL 3 06/28/2021     --------------------------------------------------------------------------------------------------  ASSESSMENT AND PLAN: Dyspnea on exertion and mitral regurgitation: Dyspnea exertion has been a longstanding issue but seems to have progressed over the last few years.  Given echocardiogram in 2018 demonstrating mild mitral regurgitation, we have agreed to repeat an echocardiogram to ensure that this has not progressed and that no other significant structural abnormality has developed.  In the meantime, we will continue Kristen Strickland's current medications including blood pressure control with amlodipine and losartan-HCTZ.  Chest/abdominal pain: Recent episode of chest and abdominal pain is most consistent with GI etiology, though in the setting of associated exertional dyspnea, coronary insufficiency is a concern.  Aortic atherosclerosis has been noted on abdominal CT scan as well.  We have discussed further evaluation options and will proceed with echocardiogram as well as coronary CTA.  In the meantime, aspirin and rosuvastatin will be continued.  Hyperlipidemia: LDL reasonable at 87 on last check  last month, though given history of aortic atherosclerosis and carotid artery disease, target LDL less than 70 would be reasonable.  We will readdress this after completion of coronary CTA.  Aortic atherosclerosis: Incidentally noted on recent CT of the abdomen.  We will plan to continue aspirin and statin therapy.  Cholelithiasis: Ongoing evaluation per PCP and upcoming GI consultation.  Carotid artery stenosis: Ongoing follow-up with vascular surgery.  Follow-up: Return to clinic in 6 months.  Nelva Bush, MD 07/26/2021 9:35 AM

## 2021-07-26 ENCOUNTER — Other Ambulatory Visit: Payer: Self-pay

## 2021-07-26 ENCOUNTER — Ambulatory Visit (INDEPENDENT_AMBULATORY_CARE_PROVIDER_SITE_OTHER): Payer: Medicare Other | Admitting: Internal Medicine

## 2021-07-26 ENCOUNTER — Telehealth: Payer: Self-pay | Admitting: *Deleted

## 2021-07-26 ENCOUNTER — Encounter: Payer: Self-pay | Admitting: Internal Medicine

## 2021-07-26 VITALS — BP 140/70 | HR 54 | Ht 64.0 in | Wt 142.2 lb

## 2021-07-26 DIAGNOSIS — R0609 Other forms of dyspnea: Secondary | ICD-10-CM

## 2021-07-26 DIAGNOSIS — R1013 Epigastric pain: Secondary | ICD-10-CM | POA: Diagnosis not present

## 2021-07-26 DIAGNOSIS — I700 Atherosclerosis of aorta: Secondary | ICD-10-CM | POA: Diagnosis not present

## 2021-07-26 DIAGNOSIS — K8020 Calculus of gallbladder without cholecystitis without obstruction: Secondary | ICD-10-CM | POA: Insufficient documentation

## 2021-07-26 DIAGNOSIS — I6523 Occlusion and stenosis of bilateral carotid arteries: Secondary | ICD-10-CM

## 2021-07-26 DIAGNOSIS — E785 Hyperlipidemia, unspecified: Secondary | ICD-10-CM | POA: Diagnosis not present

## 2021-07-26 DIAGNOSIS — I340 Nonrheumatic mitral (valve) insufficiency: Secondary | ICD-10-CM

## 2021-07-26 DIAGNOSIS — R072 Precordial pain: Secondary | ICD-10-CM | POA: Diagnosis not present

## 2021-07-26 NOTE — Patient Instructions (Addendum)
Medication Instructions:   Your physician recommends that you continue on your current medications as directed. Please refer to the Current Medication list given to you today.  *If you need a refill on your cardiac medications before your next appointment, please call your pharmacy*   Lab Work:  BMET today (pre procedure)  If you have labs (blood work) drawn today and your tests are completely normal, you will receive your results only by: [place] (if you have MyChart) OR A paper copy in the mail If you have any lab test that is abnormal or we need to change your treatment, we will call you to review the results.   Testing/Procedures:  1) Your physician has requested that you have an echocardiogram. Echocardiography is a painless test that uses sound waves to create images of your heart. It provides your doctor with information about the size and shape of your heart and how well your heart's chambers and valves are working. This procedure takes approximately one hour. There are no restrictions for this procedure.    2) Your cardiac CT is scheduled on ____________ at _____________   at the following location:   [facility] [place], [place] [address] [phone]  If scheduled at [facility], please arrive at the [facility] main entrance (entrance A) of [facility] 30 minutes prior to test start time. Proceed to the [facility] Radiology Department (first floor) to check-in and test prep.  If scheduled at [facility], please arrive 15 mins early for check-in and test prep.  Please follow these instructions carefully (unless otherwise directed):   On the Night Before the Test: Be sure to Drink plenty of water. Do not consume any caffeinated/decaffeinated beverages or chocolate 12 hours prior to your test. Do not take any antihistamines 12 hours prior to your test.  On  the Day of the Test: Drink plenty of water until 1 hour prior to the test. Do not eat any food 4 hours prior to the test. You may take your regular medications prior to the test.  FEMALES- please wear underwire-free bra if available, avoid dresses & tight clothing       After the Test: Drink plenty of water. After receiving IV contrast, you may experience a mild flushed feeling. This is normal. On occasion, you may experience a mild rash up to 24 hours after the test. This is not dangerous. If this occurs, you can take Benadryl 25 mg and increase your fluid intake. If you experience trouble breathing, this can be serious. If it is severe call 911 IMMEDIATELY. If it is mild, please call our office. If you take any of these medications: Glipizide/Metformin, Avandament, Glucavance, please do not take 48 hours after completing test unless otherwise instructed.  Please allow 2-4 weeks for scheduling of routine cardiac CTs. Some insurance companies require a pre-authorization which may delay scheduling of this test.   For non-scheduling related questions, please contact the cardiac imaging nurse navigator should you have any questions/concerns: Marchia Bond, Cardiac Imaging Nurse Navigator Gordy Clement, Cardiac Imaging Nurse Navigator [facility] Direct Office Dial: [phone]   For scheduling needs, including cancellations and rescheduling, please call [place], [phone].   Follow-Up: At [facility], you and your health needs are our priority.  As part of our continuing mission to provide you with exceptional heart care, we have created designated Provider Care Teams.  These Care Teams include  your primary Cardiologist (physician) and Advanced Practice Providers (APPs -  Physician Assistants and Nurse Practitioners) who all work together to provide you with the care you need, when you need it.  We recommend signing up for the patient portal called  "MyChart".  Sign up information is provided on this After Visit Summary.  MyChart is used to connect with patients for Virtual Visits (Telemedicine).  Patients are able to view lab/test results, encounter notes, upcoming appointments, etc.  Non-urgent messages can be sent to your provider as well.   To learn more about what you can do with MyChart, go to [email or web address].    Your next appointment:   6 month(s)  The format for your next appointment:   In Person  Provider:   You may see Nelva Bush, MD or one of the following Advanced Practice Providers on your designated Care Team:   Murray Hodgkins, NP Christell Faith, PA-C Marrianne Mood, PA-C [address], [place]

## 2021-07-26 NOTE — Telephone Encounter (Signed)
Called pt to verify coronary CTA scheduled 08/09/21 at 9:00 AM.  LDM (ok per DPR) and asked pt to call us back to verify that she received message and to make sure this date/time will work for her.   Pt seen in office today. Pt already has pre procedure instructions.

## 2021-07-27 ENCOUNTER — Other Ambulatory Visit: Payer: Self-pay

## 2021-07-27 LAB — BASIC METABOLIC PANEL
BUN/Creatinine Ratio: 23 (ref 12–28)
BUN: 19 mg/dL (ref 8–27)
CO2: 24 mmol/L (ref 20–29)
Calcium: 9.6 mg/dL (ref 8.7–10.3)
Chloride: 102 mmol/L (ref 96–106)
Creatinine, Ser: 0.81 mg/dL (ref 0.57–1.00)
Glucose: 96 mg/dL (ref 70–99)
Potassium: 4.4 mmol/L (ref 3.5–5.2)
Sodium: 140 mmol/L (ref 134–144)
eGFR: 76 mL/min/{1.73_m2} (ref >59)

## 2021-08-01 ENCOUNTER — Other Ambulatory Visit: Payer: Self-pay

## 2021-08-01 ENCOUNTER — Encounter: Payer: Self-pay | Admitting: Gastroenterology

## 2021-08-01 ENCOUNTER — Ambulatory Visit (INDEPENDENT_AMBULATORY_CARE_PROVIDER_SITE_OTHER): Payer: Medicare Other | Admitting: Gastroenterology

## 2021-08-01 VITALS — BP 169/70 | HR 59 | Temp 97.7°F | Ht 64.0 in | Wt 142.2 lb

## 2021-08-01 DIAGNOSIS — I6523 Occlusion and stenosis of bilateral carotid arteries: Secondary | ICD-10-CM | POA: Diagnosis not present

## 2021-08-01 DIAGNOSIS — R109 Unspecified abdominal pain: Secondary | ICD-10-CM | POA: Diagnosis not present

## 2021-08-01 NOTE — Patient Instructions (Signed)
Please take Char[place] tablets.  We will see you in 4 weeks.    Low-FODMAP Eating Plan FODMAP stands for fermentable oligosaccharides, disaccharides, monosaccharides, and polyols. These are sugars that are hard for some people to digest. A low-FODMAP eating plan may help some people who have irritable bowel syndrome (IBS) and certain other bowel (intestinal) diseases to manage their symptoms. This meal plan can be complicated to follow. Work with a diet and nutrition specialist (dietitian) to make a low-FODMAP eating plan that is right for you. A dietitian can help make sure that you get enough nutrition from this diet. What are tips for following this plan? Reading food labels Check labels for hidden FODMAPs such as: High-fructose syrup. Honey. Agave. Natural fruit flavors. Onion or garlic powder. Choose low-FODMAP foods that contain 3-4 grams of fiber per serving. Check food labels for serving sizes. Eat only one serving at a time to make sure FODMAP levels stay low. Shopping Shop with a list of foods that are recommended on this diet and make a meal plan. Meal planning Follow a low-FODMAP eating plan for up to 6 weeks, or as told by your health care provider or dietitian. To follow the eating plan: Eliminate high-FODMAP foods from your diet completely. Choose only low-FODMAP foods to eat. You will do this for 2-6 weeks. Gradually reintroduce high-FODMAP foods into your diet one at a time. Most people should wait a few days before introducing the next new high-FODMAP food into their meal plan. Your dietitian can recommend how quickly you may reintroduce foods. Keep a daily record of what and how much you eat and drink. Make note of any symptoms that you have after eating. Review your daily record with a dietitian regularly to identify which foods you can eat and which foods you should avoid. General tips Drink enough fluid each day to keep your urine pale yellow. Avoid processed foods.  These often have added sugar and may be high in FODMAPs. Avoid most dairy products, whole grains, and sweeteners. Work with a dietitian to make sure you get enough fiber in your diet. Avoid high FODMAP foods at meals to manage symptoms. Recommended foods Fruits Bananas, oranges, tangerines, lemons, limes, blueberries, raspberries, strawberries, grapes, cantaloupe, honeydew melon, kiwi, papaya, passion fruit, and pineapple. Limited amounts of dried cranberries, banana chips, and shredded coconut. Vegetables Eggplant, zucchini, cucumber, peppers, green beans, bean sprouts, lettuce, arugula, kale, Swiss chard, spinach, collard greens, bok choy, summer squash, potato, and tomato. Limited amounts of corn, carrot, and sweet potato. Green parts of scallions. Grains Gluten-free grains, such as rice, oats, buckwheat, quinoa, corn, polenta, and millet. Gluten-free pasta, bread, or cereal. Rice noodles. Corn tortillas. Meats and other proteins Unseasoned beef, pork, poultry, or fish. Eggs. Berniece Salines. Tofu (firm) and tempeh. Limited amounts of nuts and seeds, such as almonds, walnuts, [place] nuts, pecans, peanuts, nut butters, pumpkin seeds, chia seeds, and sunflower seeds. Dairy Lactose-free milk, yogurt, and kefir. Lactose-free cottage cheese and ice cream. Non-dairy milks, such as almond, coconut, hemp, and rice milk. Non-dairy yogurt. Limited amounts of goat cheese, brie, mozzarella, parmesan, swiss, and other hard cheeses. Fats and oils Butter-free spreads. Vegetable oils, such as olive, canola, and sunflower oil. Seasoning and other foods Artificial sweeteners with names that do not end in "ol," such as aspartame, saccharine, and stevia. Maple syrup, white table sugar, raw sugar, brown sugar, and molasses. Mayonnaise, soy sauce, and tamari. Fresh basil, coriander, parsley, rosemary, and thyme. Beverages Water and mineral water. Sugar-sweetened soft drinks. Small  amounts of orange juice or cranberry juice.  Black and green tea. Most dry wines. Coffee. The items listed above may not be a complete list of foods and beverages you can eat. Contact a dietitian for more information. Foods to avoid Fruits Fresh, dried, and juiced forms of apple, pear, watermelon, peach, plum, cherries, apricots, blackberries, boysenberries, figs, nectarines, and mango. Avocado. Vegetables Chicory root, artichoke, asparagus, cabbage, snow peas, Brussels sprouts, broccoli, sugar snap peas, mushrooms, celery, and cauliflower. Onions, garlic, leeks, and the white part of scallions. Grains Wheat, including kamut, durum, and semolina. Barley and bulgur. Couscous. Wheat-based cereals. Wheat noodles, bread, crackers, and pastries. Meats and other proteins Fried or fatty meat. Sausage. Cashews and pistachios. Soybeans, baked beans, black beans, chickpeas, kidney beans, fava beans, navy beans, lentils, black-eyed peas, and split peas. Dairy Milk, yogurt, ice cream, and soft cheese. Cream and sour cream. Milk-based sauces. Custard. Buttermilk. Soy milk. Seasoning and other foods Any sugar-free gum or candy. Foods that contain artificial sweeteners such as sorbitol, mannitol, isomalt, or xylitol. Foods that contain honey, high-fructose corn syrup, or agave. Bouillon, vegetable stock, beef stock, and chicken stock. Garlic and onion powder. Condiments made with onion, such as hummus, chutney, pickles, relish, salad dressing, and salsa. Tomato paste. Beverages Chicory-based drinks. Coffee substitutes. Chamomile tea. Fennel tea. Sweet or fortified wines such as port or sherry. Diet soft drinks made with isomalt, mannitol, maltitol, sorbitol, or xylitol. Apple, pear, and mango juice. Juices with high-fructose corn syrup. The items listed above may not be a complete list of foods and beverages you should avoid. Contact a dietitian for more information. Summary FODMAP stands for fermentable oligosaccharides, disaccharides, monosaccharides,  and polyols. These are sugars that are hard for some people to digest. A low-FODMAP eating plan is a short-term diet that helps to ease symptoms of certain bowel diseases. The eating plan usually lasts up to 6 weeks. After that, high-FODMAP foods are reintroduced gradually and one at a time. This can help you find out which foods may be causing symptoms. A low-FODMAP eating plan can be complicated. It is best to work with a dietitian who has experience with this type of plan. This information is not intended to replace advice given to you by your health care provider. Make sure you discuss any questions you have with your health care provider. Document Revised: 02/24/2020 Document Reviewed: 02/24/2020 Elsevier Patient Education  [place].

## 2021-08-01 NOTE — Progress Notes (Signed)
Jonathon Bellows MD, MRCP(U.K) [address]  [place]  [place], [place] [address]  Main: [phone]  Fax: [phone]   Gastroenterology Consultation  Referring Provider:     Einar Pheasant, MD Primary Care Physician:  Einar Pheasant, MD Primary Gastroenterologist:  Dr. Jonathon Bellows  Reason for Consultation:     Abdominal pain        HPI:   Kristen Strickland is a 75 y.o. y/o female referred for consultation & management  by Dr. Einar Pheasant, MD.    She has been referred for abdominal pain.  05/17/2021 CT abdomen and pelvis with contrast shows 1 cm gallstone but no other acute abnormalities. 07/26/2021: BMP normal 06/28/2021 hepatic function panel: Normal, HbA1c 6.0 05/11/2021 hemoglobin 11.8 gm   She states that for the past 1 year or more may be longer she gets abdominal discomfort usually at night after she goes to sleep feels like an explosion inside her belly more like a pressure.  Radiates to both sides of her abdomen no clear aggravating or relieving factors.  She has a bowel movement every day.  Not hard in nature.  When she passes flatulence it is very foul-smelling.  She consumes about 6 packets of equal artificial sugars a day.  And multiple portions of begin by and diet soda.  No weight loss.  No NSAID use.  Does not clearly say if she is bloated or distended.  Past Medical History:  Diagnosis Date   Arthritis    Carotid arterial disease ([place])    a. 11/2017 Carotid U/S: RICA 2-33%, LICA 00-76%.    Chronic fatigue    Chronic leg pain    DDD (degenerative disc disease), lumbar    Dyspnea on exertion    a. 02/2017 Echo: EF 60-65%, no rwma, mild MR. Nl RV size/fxn. Nl PASP; b. 02/2017 MV: small, mild, fixed basal inferolateral defect - ? artifact vs scar. No ischemia. EF >65%.   Hypercholesterolemia    Hypertension    Insomnia    Palpitations    a. 02/2017 Holter: Avg HR 66 (51-115), rare PACs, four brief runs of PAT - up to 5 beats, max rate 157. Rare isolated PVCs. No  sustained arrhythmias; b. 03/2018 24h Holter: Rare PACs, 3 beat run PAT (122 bpm), PVCs (2% of beats).   Radiculopathy    Lumbar region   Sinus bradycardia    Spondylolisthesis    lumbosacral region    Past Surgical History:  Procedure Laterality Date   ABDOMINAL EXPOSURE N/A 11/10/2018   Procedure: ABDOMINAL EXPOSURE;  Surgeon: Angelia Mould, MD;  [facility];  Service: Vascular;  Laterality: N/A;   [place] DECOMP/DISCECTOMY FUSION N/A 04/09/2013   Procedure: ANTERIOR CERVICAL DECOMPRESSION/DISCECTOMY FUSION 2 LEVELS;  Surgeon: Floyce Stakes, MD;  Location: [facility] ORS;  Service: Neurosurgery;  Laterality: N/A;  Cervical five-six Cervical six-seven  Anterior cervical decompression/diskectomy/fusion   ANTERIOR LUMBAR FUSION N/A 11/10/2018   Procedure: Anterior Lumbar Interbody Fusion-Lumbar five-Sacral one;  Surgeon: Earnie Larsson, MD;  Location: [place];  Service: Neurosurgery;  Laterality: N/A;   BACK SURGERY     ruptured disc   RECONSTRUCTION OF NOSE      Prior to Admission medications   Medication Sig Start Date End Date Taking? Authorizing Provider  amLODipine (NORVASC) 5 MG tablet TAKE 1 TABLET BY MOUTH ONCE DAILY. 01/29/21   End, Harrell Gave, MD  aspirin EC 81 MG tablet Take 81 mg by mouth  daily.    [provider]  gabapentin (NEURONTIN) 100 MG capsule Take 100 mg by mouth daily. 11/30/19   [provider]  losartan-hydrochlorothiazide (HYZAAR) 100-25 MG tablet Take 1 tablet by mouth daily. 07/02/21   Einar Pheasant, MD  Multiple Vitamins-Minerals (CENTRUM ADULTS PO) Take 1 capsule by mouth 1 day or 1 dose.    [provider]  pantoprazole (PROTONIX) 40 MG tablet TAKE (1) TABLET BY MOUTH DAILY 1 HOUR BEFORE BREAKFAST. 06/08/21   Burnard Hawthorne, FNP  rosuvastatin (CRESTOR) 20 MG tablet TAKE 1 TABLET BY MOUTH ONCE DAILY FOR CHOLESTEROL 01/29/21   End, Harrell Gave, MD    Family History  Problem Relation  Age of Onset   Cancer Mother        ovarian/uterine   Heart disease Mother    Heart attack Mother    Stroke Mother    Colon cancer Father    Dementia Father    COPD Brother    Congestive Heart Failure Brother    Stroke Daughter    Heart attack Daughter    Suicidality Brother    Breast cancer Neg Hx      Social History   Tobacco Use   Smoking status: Never   Smokeless tobacco: Never  Vaping Use   Vaping Use: Never used  Substance Use Topics   Alcohol use: Not Currently    Alcohol/week: 0.0 standard drinks    Comment: rarely   Drug use: No    Allergies as of 08/01/2021   (No Known Allergies)    Review of Systems:    All systems reviewed and negative except where noted in HPI.   Physical Exam:  BP (!) 169/70   Pulse (!) 59   Temp 97.7 F (36.5 C) (Oral)   Ht 5\' 4"  (1.626 m)   Wt 142 lb 3.2 oz (64.5 kg)   BMI 24.41 kg/m  No LMP recorded. Patient has had a hysterectomy. Psych:  Alert and cooperative. Normal mood and affect. General:   Alert,  Well-developed, well-nourished, pleasant and cooperative in NAD Head:  Normocephalic and atraumatic. Abdomen:  Normal bowel sounds.  No bruits.  Soft, non-tender and non-distended without masses, hepatosplenomegaly or hernias noted.  No guarding or rebound tenderness.    Neurologic:  Alert and oriented x3;  grossly normal neurologically. Psych:  Alert and cooperative. Normal mood and affect.  Imaging Studies: No results found.  Assessment and Plan:   Kristen Strickland is a 75 y.o. y/o female has been referred for abdominal pain for over 1 year duration no red flag signs.  Her history is very suggestive of gas buildup from excess consumption of artificial sugars in her diet of about 6 packets of equal a day, 2 glasses of diet soda at least and multiple servings of Pecan pie.  Often the bacteria in the small bowel segment of the sugars as they cannot be absorbed by the body as they are artificial and lead to gas production and  hence the foul-smelling flatulence due to fermentation of these sugars.  I said that since she has had it for over a year and a half and has had no weight loss or red flag signs we can try for the next 2 to 3 weeks to go off all of these sugars conventional low FODMAP diet and use activated charcoal tablets as needed.  If it works then she can reintroduce foods 1 at a time and reduce the amount of artificial sugars in the diet.  If it does not work we will proceed with CT scan of the abdomen and consider endoscopic evaluation as well Follow up in 2 to 4 weeks  Dr Jonathon Bellows MD,MRCP(U.K)

## 2021-08-08 ENCOUNTER — Telehealth (HOSPITAL_COMMUNITY): Payer: Self-pay | Admitting: Emergency Medicine

## 2021-08-08 NOTE — Telephone Encounter (Signed)
Unable to leave VM Marchia Bond RN Navigator Cardiac [facility] [phone] Office  [phone] Cell

## 2021-08-09 ENCOUNTER — Ambulatory Visit
Admission: RE | Admit: 2021-08-09 | Discharge: 2021-08-09 | Disposition: A | Discharge status: Home / Self Care | Payer: Medicare Other | Source: Ambulatory Visit | Attending: Internal Medicine | Admitting: Internal Medicine

## 2021-08-09 ENCOUNTER — Other Ambulatory Visit: Payer: Self-pay

## 2021-08-09 DIAGNOSIS — R072 Precordial pain: Secondary | ICD-10-CM | POA: Insufficient documentation

## 2021-08-09 DIAGNOSIS — R0609 Other forms of dyspnea: Secondary | ICD-10-CM

## 2021-08-09 MED ORDER — NITROGLYCERIN 0.4 MG SL SUBL
0.8000 mg | SUBLINGUAL_TABLET | Freq: Once | SUBLINGUAL | Status: AC
  Administered 2021-08-09: 0.8 mg via SUBLINGUAL

## 2021-08-09 MED ORDER — IOHEXOL 350 MG/ML SOLN
75.0000 mL | Freq: Once | INTRAVENOUS | Status: AC | PRN
  Administered 2021-08-09: 75 mL via INTRAVENOUS

## 2021-08-14 ENCOUNTER — Telehealth: Payer: Self-pay | Admitting: *Deleted

## 2021-08-14 NOTE — Telephone Encounter (Signed)
-----   Message from Nelva Bush, MD sent at 08/13/2021  7:23 AM EDT ----- Please let Kristen Strickland know that her coronary CTA shows mild plaque buildup in her heart arteries but no significant blockage.  We are still awaiting radiology's evaluation of the visualized lungs to see if there are any non-cardiac findings that might explain her shortness of breath.  She should continue her current medications and follow-up as previously arranged.  We will reach out to her again if any significant non-cardiac findings are identified by the radiologist.

## 2021-08-14 NOTE — Telephone Encounter (Signed)
-----   Message from Nelva Bush, MD sent at 08/13/2021 12:05 PM EDT ----- No significant extracardiac findings seen.  Coronaries with non-obstructive disease.  Continue current medications and follow-up as previously arranged.

## 2021-08-14 NOTE — Telephone Encounter (Signed)
Left voicemail message to call back for review of results.  

## 2021-08-15 NOTE — Telephone Encounter (Signed)
Patient returning call for results but will not be available .  Requested you leave a detailed vm of below results.

## 2021-08-16 NOTE — Telephone Encounter (Signed)
Called and spoke with pt.  Notified of CCTA results and Dr. Darnelle Bos recc below.  Also notified of follow up results "No significant extracardiac findings seen" Pt voiced understanding and will follow up with Echo that is scheduled 08/30/21.

## 2021-08-30 ENCOUNTER — Ambulatory Visit (INDEPENDENT_AMBULATORY_CARE_PROVIDER_SITE_OTHER): Payer: Medicare Other

## 2021-08-30 ENCOUNTER — Encounter: Payer: Self-pay | Admitting: Gastroenterology

## 2021-08-30 ENCOUNTER — Other Ambulatory Visit: Payer: Self-pay

## 2021-08-30 ENCOUNTER — Ambulatory Visit (INDEPENDENT_AMBULATORY_CARE_PROVIDER_SITE_OTHER): Payer: Medicare Other | Admitting: Gastroenterology

## 2021-08-30 VITALS — BP 120/61 | HR 63 | Temp 98.4°F | Wt 142.0 lb

## 2021-08-30 DIAGNOSIS — R0609 Other forms of dyspnea: Secondary | ICD-10-CM | POA: Diagnosis not present

## 2021-08-30 DIAGNOSIS — R109 Unspecified abdominal pain: Secondary | ICD-10-CM | POA: Diagnosis not present

## 2021-08-30 DIAGNOSIS — I340 Nonrheumatic mitral (valve) insufficiency: Secondary | ICD-10-CM | POA: Diagnosis not present

## 2021-08-30 DIAGNOSIS — I6523 Occlusion and stenosis of bilateral carotid arteries: Secondary | ICD-10-CM | POA: Diagnosis not present

## 2021-08-30 LAB — ECHOCARDIOGRAM COMPLETE
AV Mean grad: 3 mmHg
AV Peak grad: 5.7 mmHg
Ao pk vel: 1.19 m/s
Area-P 1/2: 3.58 cm2
Calc EF: 61.8 %
MV M vel: 5.19 m/s
MV Peak grad: 107.7 mmHg
S' Lateral: 3.3 cm
Single Plane A2C EF: 66.7 %
Single Plane A4C EF: 55.3 %

## 2021-08-30 NOTE — Progress Notes (Signed)
Jonathon Bellows MD, MRCP(U.K) [address]  [place]  [place], [place] [address]  Main: [phone]  Fax: [phone]   Primary Care Physician: Einar Pheasant, MD  Primary Gastroenterologist:  Dr. Jonathon Bellows   C/c: Follow up abdominal pain.   HPI: Kristen Strickland is a 75 y.o. female   Summary of history :  05/17/2021 CT abdomen and pelvis with contrast shows 1 cm gallstone but no other acute abnormalities. 07/26/2021: BMP normal 06/28/2021 hepatic function panel: Normal, HbA1c 6.0 05/11/2021 hemoglobin 11.8 gm    She states that for the past 1 year or more may be longer she gets abdominal discomfort usually at night after she goes to sleep feels like an explosion inside her belly more like a pressure.  Radiates to both sides of her abdomen no clear aggravating or relieving factors.  She has a bowel movement every day.  Not hard in nature.  When she passes flatulence it is very foul-smelling.  She consumes about 6 packets of equal artificial sugars a day.  And multiple portions of begin by and diet soda.  No weight loss.  No NSAID use.  Does not clearly say if she is bloated or distended.    Interval history   08/01/2021-08/30/2021  Since her last visit she has changed her diet and avoid a lot of foods which can produce gas and bloating and since then she feels better but not completely better she brought a list of food she has been eating since then and this is still a large quantity of sweet sugary foods along with at least 1 drink of [facility] on a daily basis.  No other symptoms.  Overall much better   Current Outpatient Medications  Medication Sig Dispense Refill   amLODipine (NORVASC) 2.5 MG tablet Take 1 tablet by mouth daily.     aspirin EC 81 MG tablet Take 81 mg by mouth daily.     gabapentin (NEURONTIN) 100 MG capsule Take 100 mg by mouth daily.     losartan-hydrochlorothiazide (HYZAAR) 100-25 MG tablet Take 1 tablet by mouth daily. 30 tablet 11   Multiple  Vitamins-Minerals (CENTRUM ADULTS PO) Take 1 capsule by mouth 1 day or 1 dose.     pantoprazole (PROTONIX) 40 MG tablet TAKE (1) TABLET BY MOUTH DAILY 1 HOUR BEFORE BREAKFAST. 30 tablet 11   rosuvastatin (CRESTOR) 20 MG tablet TAKE 1 TABLET BY MOUTH ONCE DAILY FOR CHOLESTEROL 90 tablet 2   No current facility-administered medications for this visit.    Allergies as of 08/30/2021   (No Known Allergies)    ROS:  General: Negative for anorexia, weight loss, fever, chills, fatigue, weakness. ENT: Negative for hoarseness, difficulty swallowing , nasal congestion. CV: Negative for chest pain, angina, palpitations, dyspnea on exertion, peripheral edema.  Respiratory: Negative for dyspnea at rest, dyspnea on exertion, cough, sputum, wheezing.  GI: See history of present illness. GU:  Negative for dysuria, hematuria, urinary incontinence, urinary frequency, nocturnal urination.  Endo: Negative for unusual weight change.    Physical Examination:   BP 120/61   Pulse 63   Temp 98.4 F (36.9 C) (Oral)   Wt 142 lb (64.4 kg)   BMI 24.37 kg/m   General: Well-nourished, well-developed in no acute distress.  Eyes: No icterus. Conjunctivae pink. Mouth: Oropharyngeal mucosa moist and pink , no lesions erythema or exudate. Neuro: Alert and oriented x 3.  Grossly intact. Skin: Warm and dry, no jaundice.   Psych: Alert and cooperative,  normal mood and affect.   Imaging Studies: CT CORONARY MORPH W/CTA COR W/SCORE W/CA W/CM &/OR WO/CM  Addendum Date: 08/13/2021   ADDENDUM REPORT: 08/13/2021 08:11 EXAM: OVER-READ INTERPRETATION  CT CHEST The following report is an over-read performed by radiologist Dr. Norlene Duel [facility] on 08/13/2021. This over-read does not include interpretation of cardiac or coronary anatomy or pathology. The coronary CTA interpretation by the cardiologist is attached. COMPARISON:  CT AP 05/16/21 FINDINGS: No mediastinal mass or adenopathy. Lungs are clear.  No pleural effusion, airspace consolidation, or no suspicious lung nodules. No acute abnormality within the imaged portions No acute or suspicious osseous. IMPRESSION: No significant noncardiac supplemental findings identified. Electronically Signed   By: Kerby Moors M.D.   On: 08/13/2021 08:11   Result Date: 08/13/2021 CLINICAL DATA:  Dyspnea on exertion EXAM: Cardiac/Coronary  CTA TECHNIQUE: The patient was scanned on a Siemens Somatom go.Top scanner. : A retrospective scan was triggered in the descending thoracic aorta. Axial non-contrast 3 mm slices were carried out through the heart. The data set was analyzed on a dedicated work station and scored using the [place]. Gantry rotation speed was 330 msecs and collimation was .6 mm. 0.8 mg of sl NTG was given. The 3D data set was reconstructed in 5% intervals of the 60-95 % of the R-R cycle. Diastolic phases were analyzed on a dedicated work station using MPR, MIP and VRT modes. The patient received 75 cc of contrast. FINDINGS: Aorta: Normal size. Moderate aortic root and ascending aorta calcifications. No dissection. Aortic Valve:  Trileaflet.  No calcifications. Coronary Arteries:  Normal coronary origin.  Right dominance. RCA is a dominant artery. The is mild calcification in the proximal RCA causing minimal stenosis (<25%) Left main is a large artery that gives rise to LAD and LCX arteries. There is minimal calcification in the ostial LM causing minimal stenosis (<25%). LAD has mild calcified plaque proximally causing mild stenosis (25-49%). LCX is a non-dominant artery that gives rise to an OM branch. There is minimal calcification causing minimal stenosis in the mid LCx. Other findings: Normal pulmonary vein drainage into the left atrium. Normal left atrial appendage without a thrombus. Normal size of the pulmonary artery. IMPRESSION: 1. Coronary calcium score of 141. This was 68th percentile for age and sex matched control. 2. Normal coronary  origin with right dominance. 3. Mild proximal LAD disease 4. Minimal disease in the LM, proximal RCA and mid LCx. 5. CAD-RADS 2. Mild non-obstructive CAD (25-49%). Consider non-atherosclerotic causes of chest pain. Consider preventive therapy and risk factor modification. Electronically Signed: By: Kate Sable M.D. On: 08/09/2021 14:58    Assessment and Plan:   Kristen Strickland is a 75 y.o. y/o female  for abdominal pain for over 1 year duration no red flag signs.  Her history is very suggestive of gas buildup from excess consumption of artificial sugars in her diet of about 6 packets of equal a day, 2 glasses of diet soda at least and multiple servings of sugars which contain regular sugar.  She since her last visit she has cut down significantly with correlation of the symptoms which has significantly improved but not resolved.  My belief is that her symptoms have not resolved because she daily consumes at least 1 or 2 servings of [facility] and a lot of sweet food as well.  This is based on the list of foods she has maintained on her diary.  I suggested her strongly to stop all  [employer] and sugary foods for a few days to check if her symptoms improve.  If they do improve she can reintroduce these foods at a smaller quantity that her body can handle.  I explained that if symptoms do not improve with this intervention we could consider upper endoscopy and also consider treating her with an antibiotic course to eradicate bacteria in the small bowel.  She will call me in 2 weeks time and leave a message informing if this intervention did work or did not work. Dr Jonathon Bellows  MD,MRCP [facility]) Follow up in as needed

## 2021-09-04 ENCOUNTER — Telehealth: Payer: Self-pay | Admitting: *Deleted

## 2021-09-04 NOTE — Telephone Encounter (Signed)
Attempted to call pt to discuss results. No answer. Lmtcb.  

## 2021-09-04 NOTE — Telephone Encounter (Signed)
Patient returning call for results.  She will not be home until 330 .  Tomorrow patient will be home.

## 2021-09-04 NOTE — Telephone Encounter (Signed)
-----   Message from Nelva Bush, MD sent at 09/03/2021  7:13 AM EST ----- Please let Kristen Strickland know that her echocardiogram shows that her heart is squeezing well but appears somewhat stiffened.  There is also mild leakage of the mitral valve.  It is possible that the stiffening of her ventricle could be causing some of her shortness of breath.  I recommend that she continue her current medications and let us know if her shortness of breath worsens.  Otherwise, we will follow-up as planned in about 5 months.

## 2021-09-04 NOTE — Telephone Encounter (Signed)
Spoke with pt. Notified of echo results and Dr. Darnelle Bos message below.  Pt voiced understanding and has no further questions.  Pt has been scheduled for 6 mo f/u 01/09/22 @ 9:20 AM.

## 2021-09-10 DIAGNOSIS — M65332 Trigger finger, left middle finger: Secondary | ICD-10-CM | POA: Diagnosis not present

## 2021-09-25 DIAGNOSIS — J101 Influenza due to other identified influenza virus with other respiratory manifestations: Secondary | ICD-10-CM | POA: Diagnosis not present

## 2021-09-25 DIAGNOSIS — R058 Other specified cough: Secondary | ICD-10-CM | POA: Diagnosis not present

## 2021-09-27 ENCOUNTER — Encounter: Payer: Medicare Other | Admitting: Internal Medicine

## 2021-10-23 ENCOUNTER — Other Ambulatory Visit: Payer: Self-pay | Admitting: Internal Medicine

## 2021-10-23 NOTE — Telephone Encounter (Signed)
Yes ok to refill Amlodipine 5 mg daily. Thank you.

## 2021-10-23 NOTE — Telephone Encounter (Signed)
Spoke with patient who states she is still taking 5mg  of amlodipine daily and does not know why it would have been changed on her med list. OK to change and refill?

## 2021-10-23 NOTE — Addendum Note (Signed)
Addended by: Raelene Bott, Libbey Duce L on: 10/23/2021 04:01 PM   Modules accepted: Orders

## 2021-10-23 NOTE — Telephone Encounter (Signed)
It looks as if medication dose has been lowered since last office visit. 5mg  dose discontinued by Wayna Chalet, [place] for general surgeon. Left message requesting to have patient call back to discuss.

## 2021-10-24 IMAGING — MG MM DIGITAL SCREENING BILAT W/ TOMO AND CAD
6 of 10 series · 6 of 30 positions shown · non-contrast
Comparison: Previous exam(s).

CLINICAL DATA: Screening.

EXAM:
DIGITAL SCREENING BILATERAL MAMMOGRAM WITH TOMOSYNTHESIS AND CAD
TECHNIQUE: Bilateral screening digital craniocaudal and mediolateral oblique
mammograms were obtained. Bilateral screening digital breast
tomosynthesis was performed. The images were evaluated with
computer-aided detection.

[R MLO synth-2D (1 of 2)]
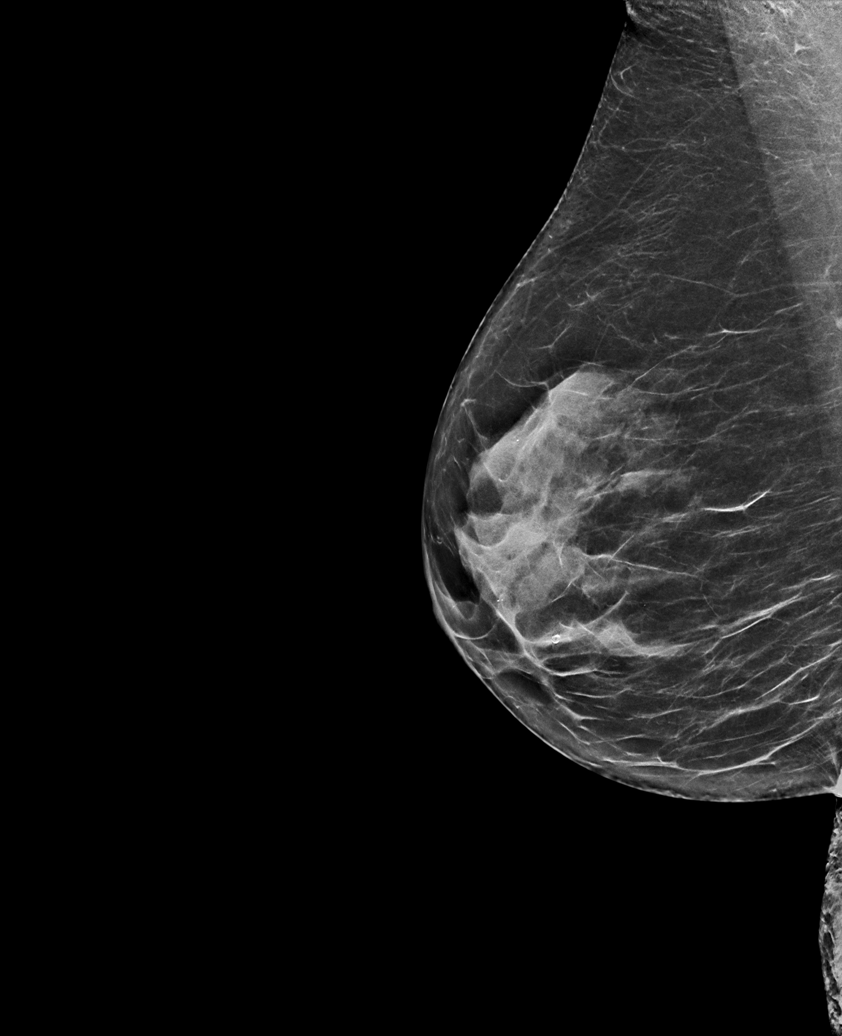

[R MLO synth-2D (2 of 2)]
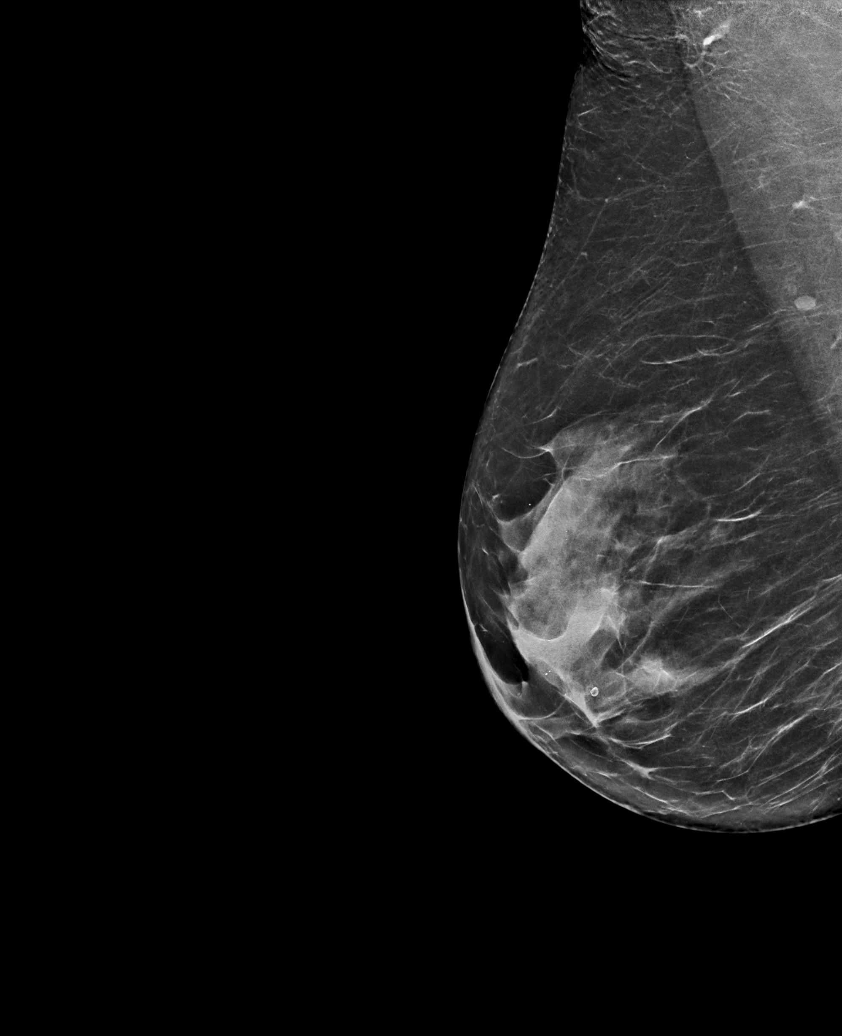

[L MLO synth-2D]
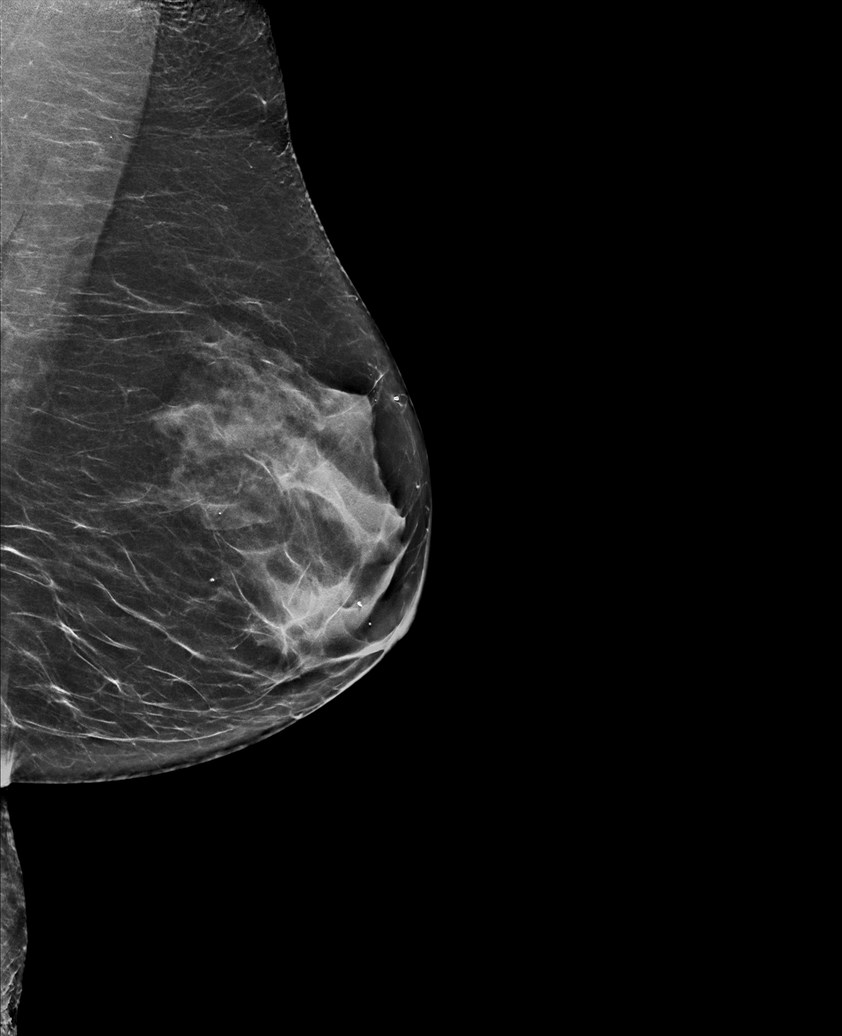

[L CC synth-2D]
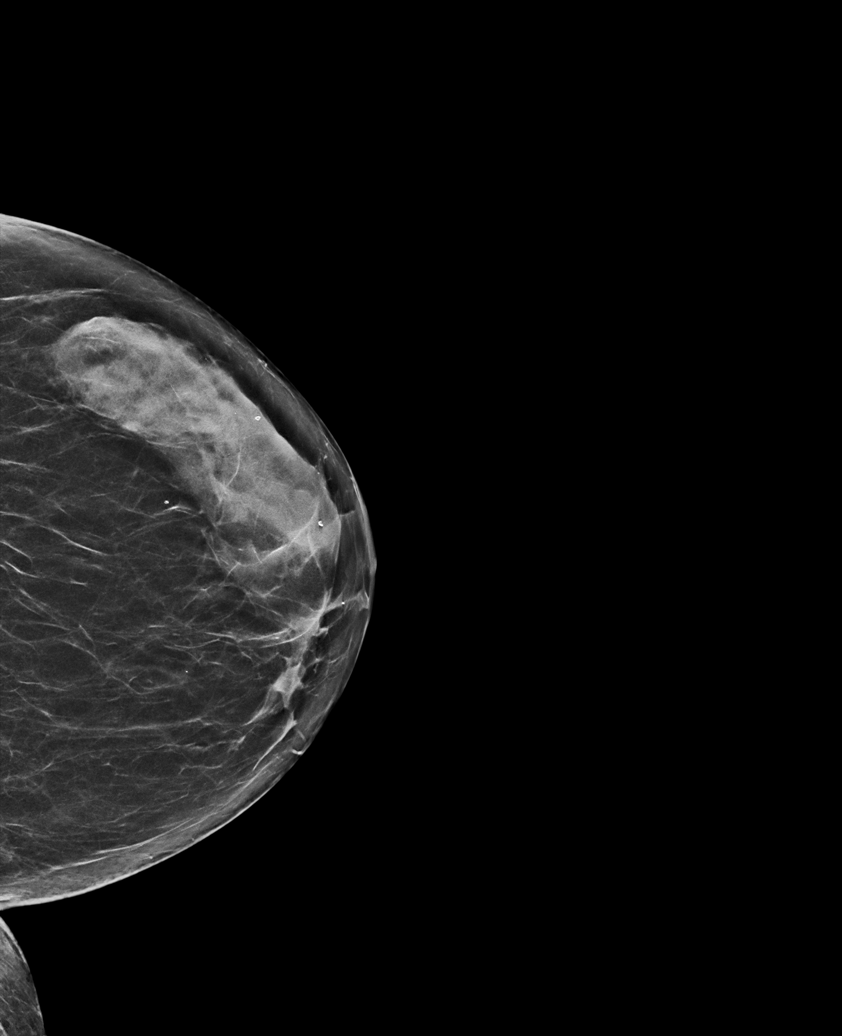

[R CC synth-2D]
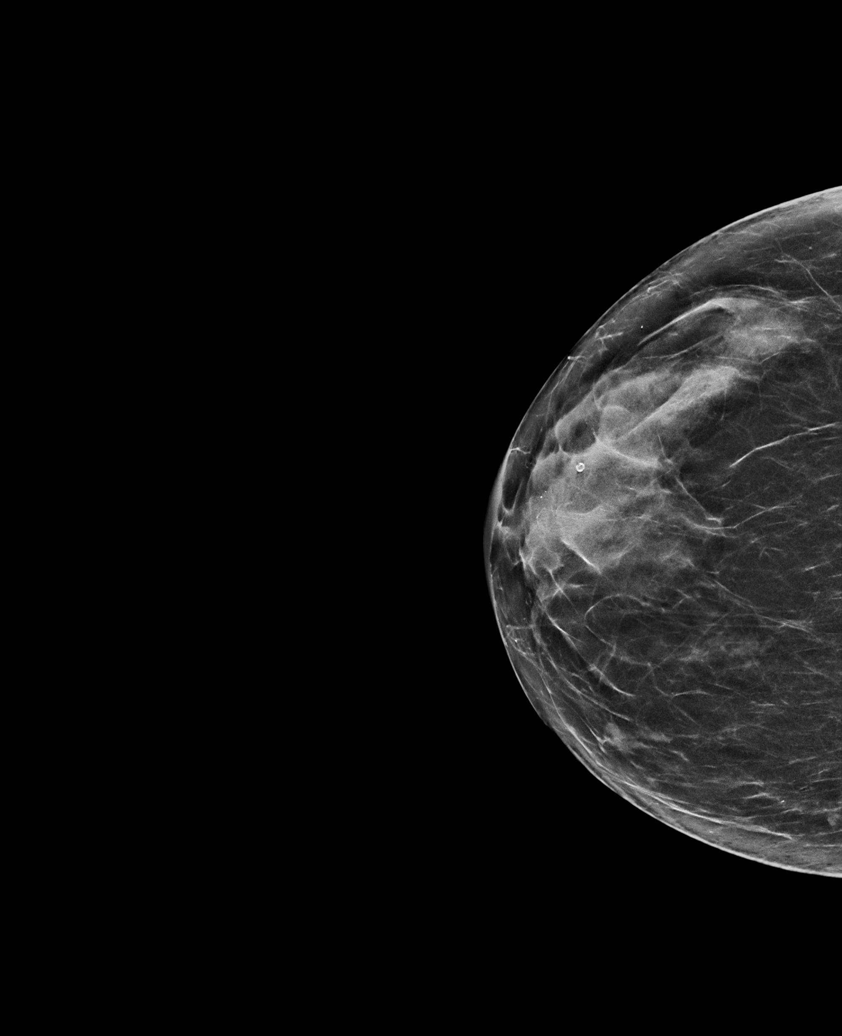

[R MLO tomo · tomo slice 35/68.0]
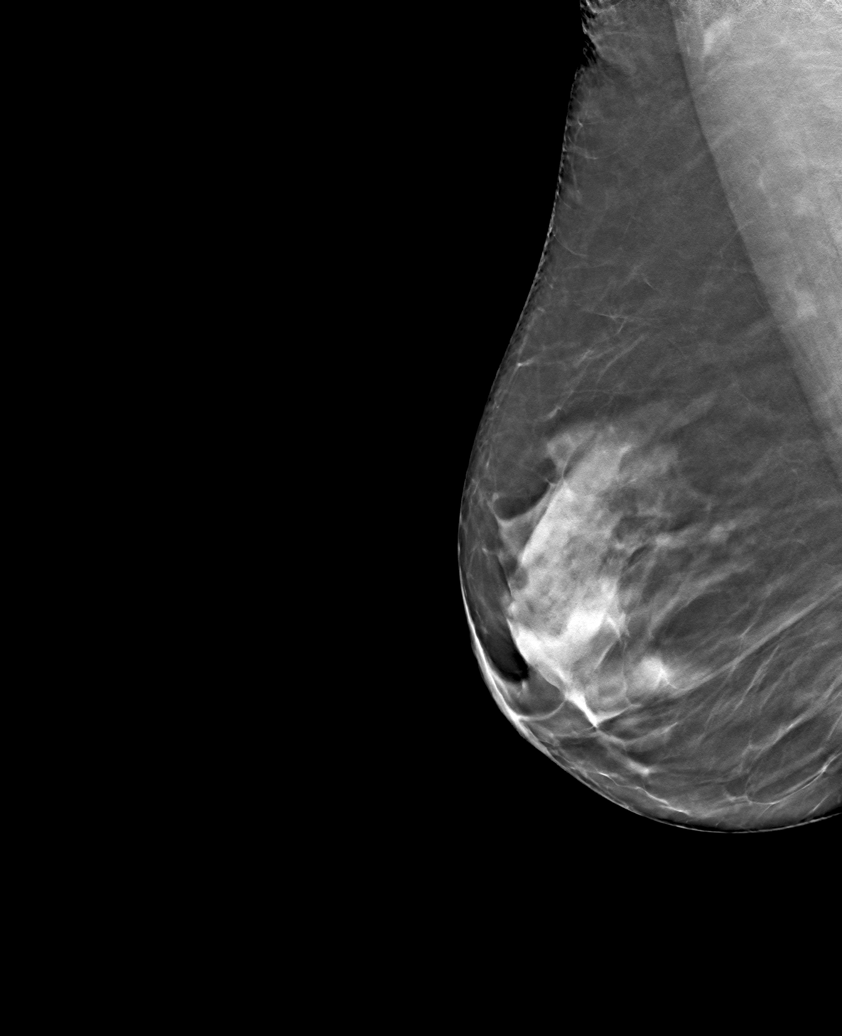

[6 of 30 positions shown; findings below may reference images not displayed]

ACR Breast Density Category c: The breast tissue is heterogeneously
dense, which may obscure small masses.
FINDINGS: There are no findings suspicious for malignancy. The images were
evaluated with computer-aided detection.
IMPRESSION: No mammographic evidence of malignancy. A result letter of this
screening mammogram will be mailed directly to the patient.

RECOMMENDATION:
Screening mammogram in one year. (Code:T4-5-GWO)

BI-RADS CATEGORY  1: Negative.

## 2021-11-08 ENCOUNTER — Ambulatory Visit (INDEPENDENT_AMBULATORY_CARE_PROVIDER_SITE_OTHER): Payer: Medicare Other | Admitting: Internal Medicine

## 2021-11-08 ENCOUNTER — Other Ambulatory Visit: Payer: Self-pay

## 2021-11-08 ENCOUNTER — Encounter: Payer: Self-pay | Admitting: Internal Medicine

## 2021-11-08 VITALS — BP 140/78 | HR 71 | Temp 97.6°F | Resp 16 | Ht 64.0 in | Wt 145.0 lb

## 2021-11-08 DIAGNOSIS — F439 Reaction to severe stress, unspecified: Secondary | ICD-10-CM | POA: Diagnosis not present

## 2021-11-08 DIAGNOSIS — E785 Hyperlipidemia, unspecified: Secondary | ICD-10-CM

## 2021-11-08 DIAGNOSIS — R002 Palpitations: Secondary | ICD-10-CM | POA: Diagnosis not present

## 2021-11-08 DIAGNOSIS — I779 Disorder of arteries and arterioles, unspecified: Secondary | ICD-10-CM

## 2021-11-08 DIAGNOSIS — I10 Essential (primary) hypertension: Secondary | ICD-10-CM | POA: Diagnosis not present

## 2021-11-08 DIAGNOSIS — R739 Hyperglycemia, unspecified: Secondary | ICD-10-CM

## 2021-11-08 DIAGNOSIS — I700 Atherosclerosis of aorta: Secondary | ICD-10-CM

## 2021-11-08 DIAGNOSIS — Z0000 Encounter for general adult medical examination without abnormal findings: Secondary | ICD-10-CM

## 2021-11-08 DIAGNOSIS — R1013 Epigastric pain: Secondary | ICD-10-CM | POA: Diagnosis not present

## 2021-11-08 LAB — HEPATIC FUNCTION PANEL
ALT: 18 U/L (ref 0–35)
AST: 17 U/L (ref 0–37)
Albumin: 4.3 g/dL (ref 3.5–5.2)
Alkaline Phosphatase: 66 U/L (ref 39–117)
Bilirubin, Direct: 0.2 mg/dL (ref 0.0–0.3)
Total Bilirubin: 1 mg/dL (ref 0.2–1.2)
Total Protein: 6.6 g/dL (ref 6.0–8.3)

## 2021-11-08 LAB — BASIC METABOLIC PANEL
BUN: 20 mg/dL (ref 6–23)
CO2: 30 mEq/L (ref 19–32)
Calcium: 9 mg/dL (ref 8.4–10.5)
Chloride: 103 mEq/L (ref 96–112)
Creatinine, Ser: 1.01 mg/dL (ref 0.40–1.20)
GFR: 54.83 mL/min — ABNORMAL LOW (ref >60.00)
Glucose, Bld: 90 mg/dL (ref 70–99)
Potassium: 4 mEq/L (ref 3.5–5.1)
Sodium: 142 mEq/L (ref 135–145)

## 2021-11-08 LAB — HEMOGLOBIN A1C: Hgb A1c MFr Bld: 6 % (ref 4.6–6.5)

## 2021-11-08 NOTE — Assessment & Plan Note (Signed)
Low carb diet and exercise.  Follow met b and a1c.

## 2021-11-08 NOTE — Assessment & Plan Note (Signed)
Continue crestor 

## 2021-11-08 NOTE — Assessment & Plan Note (Signed)
On crestor.  Low cholesterol diet and exercise.  Follow lipid panel and liver function tests.   

## 2021-11-08 NOTE — Assessment & Plan Note (Signed)
Blood pressure as outlined.  Continue losartan/hctz and amlodipine.  Reports systolic home readings [id number]. Continue current medication regimen.  Follow pressures.  Follow metabolic panel.

## 2021-11-08 NOTE — Assessment & Plan Note (Signed)
Sees cardiology.  On coreg.  Occasional fluttering.  Discussed f/u EKG and evaluation. Has f/u soon with Dr End.  Desires no further intervention at this time.  Follow.

## 2021-11-08 NOTE — Progress Notes (Signed)
Patient ID: Kristen Strickland, female   DOB: Oct 17, 1946, 76 y.o.   MRN: [id number]   Subjective:    Patient ID: Kristen Strickland, female    DOB: [birth year], 76 y.o.   MRN: [id number]  This visit occurred during the SARS-CoV-2 public health emergency.  Safety protocols were in place, including screening questions prior to the visit, additional usage of staff PPE, and extensive cleaning of exam room while observing appropriate contact time as indicated for disinfecting solutions.   Patient here for a scheduled follow up.  Marland Kitchen   HPI Here to follow up regarding her cholesterol, blood pressure, palpitations and abdominal discomfort.  She has seen GI recently for f/u abdominal discomfort - described as an explosion inside her belly.  Was instructed to avoid foods that can produce gas and bloating - cut out artificial sweeteners, mountain dew, sweet sugary foods, etc.  She reports today, not noticing a big change in symptoms.  Maybe some improvement.  Eating.  No nausea or vomiting.  Bowels moving.  No chest pain or sob reported.  Occasional palpitations.  Discussed further cardiac evaluation.  Wants to monitor.  Tries to stay active.  S/p finger injection - finger better.  Was placed on meloxicam. Discussed possible side effects and risk of antiinflammatories.    Past Medical History:  Diagnosis Date   Arthritis    Carotid arterial disease ([place])    a. 11/2017 Carotid U/S: RICA 4-16%, LICA 60-63%.    Chronic fatigue    Chronic leg pain    DDD (degenerative disc disease), lumbar    Dyspnea on exertion    a. 02/2017 Echo: EF 60-65%, no rwma, mild MR. Nl RV size/fxn. Nl PASP; b. 02/2017 MV: small, mild, fixed basal inferolateral defect - ? artifact vs scar. No ischemia. EF >65%.   Hypercholesterolemia    Hypertension    Insomnia    Palpitations    a. 02/2017 Holter: Avg HR 66 (51-115), rare PACs, four brief runs of PAT - up to 5 beats, max rate 157. Rare isolated PVCs. No sustained arrhythmias; b. 03/2018 24h  Holter: Rare PACs, 3 beat run PAT (122 bpm), PVCs (2% of beats).   Radiculopathy    Lumbar region   Sinus bradycardia    Spondylolisthesis    lumbosacral region   Past Surgical History:  Procedure Laterality Date   ABDOMINAL EXPOSURE N/A 11/10/2018   Procedure: ABDOMINAL EXPOSURE;  Surgeon: Angelia Mould, MD;  Location: [facility] OR;  Service: Vascular;  Laterality: N/A;   [place] DECOMP/DISCECTOMY FUSION N/A 04/09/2013   Procedure: ANTERIOR CERVICAL DECOMPRESSION/DISCECTOMY FUSION 2 LEVELS;  Surgeon: Floyce Stakes, MD;  Location: [facility];  Service: Neurosurgery;  Laterality: N/A;  Cervical five-six Cervical six-seven  Anterior cervical decompression/diskectomy/fusion   ANTERIOR LUMBAR FUSION N/A 11/10/2018   Procedure: Anterior Lumbar Interbody Fusion-Lumbar five-Sacral one;  Surgeon: Earnie Larsson, MD;  Location: [place];  Service: Neurosurgery;  Laterality: N/A;   BACK SURGERY     ruptured disc   RECONSTRUCTION OF NOSE     Family History  Problem Relation Age of Onset   Cancer Mother        ovarian/uterine   Heart disease Mother    Heart attack Mother    Stroke Mother    Colon cancer Father    Dementia Father    COPD Brother    Congestive Heart Failure Brother    Stroke Daughter    Heart attack Daughter  Suicidality Brother    Breast cancer Neg Hx    Social History   Socioeconomic History   Marital status: Married    Spouse name: Not on file   Number of children: 2   Years of education: Not on file   Highest education level: Not on file  Occupational History   Not on file  Tobacco Use   Smoking status: Never   Smokeless tobacco: Never  Vaping Use   Vaping Use: Never used  Substance and Sexual Activity   Alcohol use: Not Currently    Alcohol/week: 0.0 standard drinks    Comment: rarely   Drug use: No   Sexual activity: Yes  Other Topics Concern   Not on file  Social History Narrative   Not on file   Social  Determinants of Health   Financial Resource Strain: Low Risk    Difficulty of Paying Living Expenses: Not hard at all  Food Insecurity: No Food Insecurity   Worried About Charity fundraiser in the Last Year: Never true   [place] in the Last Year: Never true  Transportation Needs: No Transportation Needs   Lack of Transportation (Medical): No   Lack of Transportation (Non-Medical): No  Physical Activity: Sufficiently Active   Days of Exercise per Week: 5 days   Minutes of Exercise per Session: 30 min  Stress: No Stress Concern Present   Feeling of Stress : Not at all  Social Connections: Unknown   Frequency of Communication with Friends and Family: More than three times a week   Frequency of Social Gatherings with Friends and Family: More than three times a week   Attends Religious Services: Not on Electrical engineer or Organizations: Not on file   Attends Archivist Meetings: Not on file   Marital Status: Not on file     Review of Systems  Constitutional:  Negative for appetite change and unexpected weight change.  HENT:  Negative for congestion and sinus pressure.   Respiratory:  Negative for cough, chest tightness and shortness of breath.   Cardiovascular:  Negative for chest pain, palpitations and leg swelling.  Gastrointestinal:  Positive for abdominal pain. Negative for diarrhea, nausea and vomiting.  Genitourinary:  Negative for difficulty urinating and dysuria.  Musculoskeletal:  Negative for myalgias.  Skin:  Negative for color change and rash.  Neurological:  Negative for dizziness, light-headedness and headaches.  Psychiatric/Behavioral:  Negative for agitation and dysphoric mood.       Objective:     BP 140/78    Pulse 71    Temp 97.6 F (36.4 C)    Resp 16    Ht '5\' 4"'  (1.626 m)    Wt 145 lb (65.8 kg)    SpO2 99%    BMI 24.89 kg/m  Wt Readings from Last 3 Encounters:  11/08/21 145 lb (65.8 kg)  08/30/21 142 lb (64.4 kg)  08/01/21  142 lb 3.2 oz (64.5 kg)    Physical Exam Vitals reviewed.  Constitutional:      General: She is not in acute distress.    Appearance: Normal appearance.  HENT:     Head: Normocephalic and atraumatic.     Right Ear: External ear normal.     Left Ear: External ear normal.  Eyes:     General: No scleral icterus.       Right eye: No discharge.        Left eye: No discharge.  Conjunctiva/sclera: Conjunctivae normal.  Neck:     Thyroid: No thyromegaly.  Cardiovascular:     Rate and Rhythm: Normal rate and regular rhythm.  Pulmonary:     Effort: No respiratory distress.     Breath sounds: Normal breath sounds. No wheezing.  Abdominal:     General: Bowel sounds are normal.     Palpations: Abdomen is soft.     Tenderness: There is no abdominal tenderness.  Musculoskeletal:        General: No swelling or tenderness.     Cervical back: Neck supple. No tenderness.  Lymphadenopathy:     Cervical: No cervical adenopathy.  Skin:    Findings: No erythema or rash.  Neurological:     Mental Status: She is alert.  Psychiatric:        Mood and Affect: Mood normal.        Behavior: Behavior normal.     Outpatient Encounter Medications as of 11/08/2021  Medication Sig   amLODipine (NORVASC) 5 MG tablet TAKE 1 TABLET BY MOUTH ONCE DAILY.   aspirin EC 81 MG tablet Take 81 mg by mouth daily.   gabapentin (NEURONTIN) 100 MG capsule Take 100 mg by mouth daily.   losartan-hydrochlorothiazide (HYZAAR) 100-25 MG tablet Take 1 tablet by mouth daily.   meloxicam (MOBIC) 7.5 MG tablet Take 1 tablet by mouth daily.   Multiple Vitamins-Minerals (CENTRUM ADULTS PO) Take 1 capsule by mouth 1 day or 1 dose.   pantoprazole (PROTONIX) 40 MG tablet TAKE (1) TABLET BY MOUTH DAILY 1 HOUR BEFORE BREAKFAST.   rosuvastatin (CRESTOR) 20 MG tablet TAKE 1 TABLET BY MOUTH ONCE DAILY FOR CHOLESTEROL   No facility-administered encounter medications on file as of 11/08/2021.     Lab Results  Component Value  Date   WBC 4.9 05/11/2021   HGB 11.8 05/11/2021   HCT 35.6 05/11/2021   PLT 210 05/11/2021   GLUCOSE 90 11/08/2021   CHOL 169 06/28/2021   TRIG 128.0 06/28/2021   HDL 56.60 06/28/2021   LDLDIRECT 151.7 08/16/2013   LDLCALC 87 06/28/2021   ALT 18 11/08/2021   AST 17 11/08/2021   NA 142 11/08/2021   K 4.0 11/08/2021   CL 103 11/08/2021   CREATININE 1.01 11/08/2021   BUN 20 11/08/2021   CO2 30 11/08/2021   TSH 2.80 05/11/2021   HGBA1C 6.0 11/08/2021    CT CORONARY MORPH W/CTA COR W/SCORE W/CA W/CM &/OR WO/CM  Addendum Date: 08/13/2021   ADDENDUM REPORT: 08/13/2021 08:11 EXAM: OVER-READ INTERPRETATION  CT CHEST The following report is an over-read performed by radiologist Dr. Norlene Duel [facility] on 08/13/2021. This over-read does not include interpretation of cardiac or coronary anatomy or pathology. The coronary CTA interpretation by the cardiologist is attached. COMPARISON:  CT AP 05/16/21 FINDINGS: No mediastinal mass or adenopathy. Lungs are clear. No pleural effusion, airspace consolidation, or no suspicious lung nodules. No acute abnormality within the imaged portions No acute or suspicious osseous. IMPRESSION: No significant noncardiac supplemental findings identified. Electronically Signed   By: Kerby Moors M.D.   On: 08/13/2021 08:11   Result Date: 08/13/2021 CLINICAL DATA:  Dyspnea on exertion EXAM: Cardiac/Coronary  CTA TECHNIQUE: The patient was scanned on a Siemens Somatom go.Top scanner. : A retrospective scan was triggered in the descending thoracic aorta. Axial non-contrast 3 mm slices were carried out through the heart. The data set was analyzed on a dedicated work station and scored using the [place]. Gantry rotation speed was 330  msecs and collimation was .6 mm. 0.8 mg of sl NTG was given. The 3D data set was reconstructed in 5% intervals of the 60-95 % of the R-R cycle. Diastolic phases were analyzed on a dedicated work station using MPR, MIP  and VRT modes. The patient received 75 cc of contrast. FINDINGS: Aorta: Normal size. Moderate aortic root and ascending aorta calcifications. No dissection. Aortic Valve:  Trileaflet.  No calcifications. Coronary Arteries:  Normal coronary origin.  Right dominance. RCA is a dominant artery. The is mild calcification in the proximal RCA causing minimal stenosis (<25%) Left main is a large artery that gives rise to LAD and LCX arteries. There is minimal calcification in the ostial LM causing minimal stenosis (<25%). LAD has mild calcified plaque proximally causing mild stenosis (25-49%). LCX is a non-dominant artery that gives rise to an OM branch. There is minimal calcification causing minimal stenosis in the mid LCx. Other findings: Normal pulmonary vein drainage into the left atrium. Normal left atrial appendage without a thrombus. Normal size of the pulmonary artery. IMPRESSION: 1. Coronary calcium score of 141. This was 68th percentile for age and sex matched control. 2. Normal coronary origin with right dominance. 3. Mild proximal LAD disease 4. Minimal disease in the LM, proximal RCA and mid LCx. 5. CAD-RADS 2. Mild non-obstructive CAD (25-49%). Consider non-atherosclerotic causes of chest pain. Consider preventive therapy and risk factor modification. Electronically Signed: By: Kate Sable M.D. On: 08/09/2021 14:58       Assessment & Plan:   Problem List Items Addressed This Visit     Abdominal pain    Saw GI as outlined.  Discussed avoiding artificial sweeteners, mountain dew, etc.  Start probiotics.  Follow.  Call if persistent problems.       Aortic atherosclerosis (HCC)    Continue crestor.       Carotid artery disease ([place])    Continue f/u with vascular surgery.  Continue risk factor modification.       Health care maintenance   Hyperglycemia - Primary    Low carb diet and exercise.  Follow met b and a1c.       Relevant Orders   Hemoglobin A1c (Completed)   Hyperlipidemia  LDL goal <70    On crestor.  Low cholesterol diet and exercise.  Follow lipid panel and liver function tests.        Relevant Orders   Hepatic function panel (Completed)   Hypertension    Blood pressure as outlined.  Continue losartan/hctz and amlodipine.  Reports systolic home readings 845X. Continue current medication regimen.  Follow pressures.  Follow metabolic panel.       Palpitations    Sees cardiology.  On coreg.  Occasional fluttering.  Discussed f/u EKG and evaluation. Has f/u soon with Dr End.  Desires no further intervention at this time.  Follow.       Stress    Overall appears to be handling things well. Follow.       Other Visit Diagnoses     Essential hypertension       Relevant Orders   Basic metabolic panel (Completed)        Einar Pheasant, MD

## 2021-11-08 NOTE — Patient Instructions (Signed)
Examples of probiotics:  align (tablet), culturelle and florastor (powder)

## 2021-11-08 NOTE — Assessment & Plan Note (Signed)
Continue f/u with vascular surgery.  Continue risk factor modification.

## 2021-11-08 NOTE — Assessment & Plan Note (Signed)
Saw GI as outlined.  Discussed avoiding artificial sweeteners, mountain dew, etc.  Start probiotics.  Follow.  Call if persistent problems.

## 2021-11-08 NOTE — Assessment & Plan Note (Signed)
Overall appears to be handling things well.  Follow.  ?

## 2021-11-12 ENCOUNTER — Telehealth: Payer: Self-pay | Admitting: Internal Medicine

## 2021-11-12 MED ORDER — ROSUVASTATIN CALCIUM 20 MG PO TABS: 20.0000 mg | ORAL_TABLET | Freq: Every day | ORAL | 0 refills | Status: DC

## 2021-11-12 NOTE — Telephone Encounter (Signed)
*  STAT* If patient is at the pharmacy, call can be transferred to refill team.   1. Which medications need to be refilled? (please list name of each medication and dose if known) rosuvastatin 20 mg po q d   2. Which pharmacy/location (including street and city if local pharmacy) is medication to be sent to?[place], [place]  3. Do they need a 30 day or 90 day supply? [place]

## 2021-11-13 ENCOUNTER — Other Ambulatory Visit: Payer: Self-pay

## 2021-11-13 DIAGNOSIS — R944 Abnormal results of kidney function studies: Secondary | ICD-10-CM

## 2021-11-19 ENCOUNTER — Other Ambulatory Visit: Payer: Self-pay | Admitting: Internal Medicine

## 2021-11-19 DIAGNOSIS — Z1231 Encounter for screening mammogram for malignant neoplasm of breast: Secondary | ICD-10-CM

## 2021-12-12 ENCOUNTER — Telehealth: Payer: Self-pay

## 2021-12-12 ENCOUNTER — Other Ambulatory Visit (INDEPENDENT_AMBULATORY_CARE_PROVIDER_SITE_OTHER): Payer: Medicare Other

## 2021-12-12 ENCOUNTER — Other Ambulatory Visit: Payer: Self-pay

## 2021-12-12 DIAGNOSIS — R944 Abnormal results of kidney function studies: Secondary | ICD-10-CM

## 2021-12-12 LAB — BASIC METABOLIC PANEL
BUN: 20 mg/dL (ref 6–23)
CO2: 30 mEq/L (ref 19–32)
Calcium: 9.4 mg/dL (ref 8.4–10.5)
Chloride: 99 mEq/L (ref 96–112)
Creatinine, Ser: 0.84 mg/dL (ref 0.40–1.20)
GFR: 68.35 mL/min (ref >60.00)
Glucose, Bld: 182 mg/dL — ABNORMAL HIGH (ref 70–99)
Potassium: 3.9 mEq/L (ref 3.5–5.1)
Sodium: 135 mEq/L (ref 135–145)

## 2021-12-12 NOTE — Telephone Encounter (Signed)
-----   Message from Einar Pheasant, MD sent at 12/12/2021  3:22 PM EST ----- Notify - kidney function has improved and is ok.

## 2021-12-12 NOTE — Progress Notes (Signed)
Lm for pt to cb.

## 2022-01-01 ENCOUNTER — Other Ambulatory Visit: Payer: Self-pay

## 2022-01-01 ENCOUNTER — Ambulatory Visit
Admission: RE | Admit: 2022-01-01 | Discharge: 2022-01-01 | Disposition: A | Discharge status: Home / Self Care | Payer: Medicare Other | Source: Ambulatory Visit | Attending: Internal Medicine | Admitting: Internal Medicine

## 2022-01-01 DIAGNOSIS — Z1231 Encounter for screening mammogram for malignant neoplasm of breast: Secondary | ICD-10-CM | POA: Diagnosis not present

## 2022-01-07 NOTE — Progress Notes (Signed)
? ?Follow-up Outpatient Visit ?Date: 01/09/2022 ? ?Primary Care Provider: ?Einar Pheasant, MD ?[address] ?[place] [address] ? ?Chief Complaint: Follow-up coronary artery disease and exertional dyspnea ? ?HPI:  Kristen Strickland is a 76 y.o. female with history of hypertension, hyperlipidemia, carotid artery stenosis (followed by vascular surgery), and degenerative disc disease, who presents for follow-up of shortness of breath, leg swelling, and hyperlipidemia.  I last saw her in 07/2021, at which time she reported DOE when walking up an incline that had worsened slightly since our prior visits.  She also recounted an episode of severe abdominal pain radiating to the chest.  Subsequent coronary CTA showed-moderate, non-obstructive CAD (<50%).  Echo showed normal LVEF with grade 2 diastolic dysfunction, moderate left atrial enlargement, and mild mitral regurgitation. ? ?Today, Ms. Leikam reports feeling about the same as at prior visits.  She is able to walk on level ground without any difficulty but continues to have exertional dyspnea going up any incline.  She denies chest pain.  She has experienced mild swelling in her legs, left greater than right.  She reports frequent orthostatic lightheadedness and balance issues, though she has not fallen.  She also reports sporadic palpitations that feel like flutters in her chest.  These usually happen at night and only last a few seconds. ? ?-------------------------------------------------------------------------------------------------- ? ?Past Medical History:  ?Diagnosis Date  ? Arthritis   ? Carotid arterial disease ([place])   ? a. 11/2017 Carotid U/S: RICA 4-23%, LICA 53-61%.   ? Chronic fatigue   ? Chronic leg pain   ? DDD (degenerative disc disease), lumbar   ? Dyspnea on exertion   ? a. 02/2017 Echo: EF 60-65%, no rwma, mild MR. Nl RV size/fxn. Nl PASP; b. 02/2017 MV: small, mild, fixed basal inferolateral defect - ? artifact vs scar. No  ischemia. EF >65%.  ? Hypercholesterolemia   ? Hypertension   ? Insomnia   ? Palpitations   ? a. 02/2017 Holter: Avg HR 66 (51-115), rare PACs, four brief runs of PAT - up to 5 beats, max rate 157. Rare isolated PVCs. No sustained arrhythmias; b. 03/2018 24h Holter: Rare PACs, 3 beat run PAT (122 bpm), PVCs (2% of beats).  ? Radiculopathy   ? Lumbar region  ? Sinus bradycardia   ? Spondylolisthesis   ? lumbosacral region  ? ?Past Surgical History:  ?Procedure Laterality Date  ? ABDOMINAL EXPOSURE N/A 11/10/2018  ? Procedure: ABDOMINAL EXPOSURE;  Surgeon: Angelia Mould, MD;  Location: [place];  Service: Vascular;  Laterality: N/A;  ? ABDOMINAL HYSTERECTOMY  1975  ? ANTERIOR CERVICAL DECOMP/DISCECTOMY FUSION N/A 04/09/2013  ? Procedure: ANTERIOR CERVICAL DECOMPRESSION/DISCECTOMY FUSION 2 LEVELS;  Surgeon: Floyce Stakes, MD;  Location: [facility];  Service: Neurosurgery;  Laterality: N/A;  Cervical five-six Cervical six-seven  Anterior cervical decompression/diskectomy/fusion  ? ANTERIOR LUMBAR FUSION N/A 11/10/2018  ? Procedure: Anterior Lumbar Interbody Fusion-Lumbar five-Sacral one;  Surgeon: Earnie Larsson, MD;  Location: [place];  Service: Neurosurgery;  Laterality: N/A;  ? BACK SURGERY    ? ruptured disc  ? RECONSTRUCTION OF NOSE    ? ? ?Current Meds  ?Medication Sig  ? amLODipine (NORVASC) 5 MG tablet TAKE 1 TABLET BY MOUTH ONCE DAILY.  ? aspirin EC 81 MG tablet Take 81 mg by mouth daily.  ? gabapentin (NEURONTIN) 100 MG capsule Take 100 mg by mouth daily.  ? losartan-hydrochlorothiazide (HYZAAR) 100-25 MG tablet Take 1 tablet by mouth daily.  ? meloxicam (MOBIC) 7.5  MG tablet Take 1 tablet by mouth daily.  ? Multiple Vitamins-Minerals (CENTRUM ADULTS PO) Take 1 capsule by mouth 1 day or 1 dose.  ? pantoprazole (PROTONIX) 40 MG tablet TAKE (1) TABLET BY MOUTH DAILY 1 HOUR BEFORE BREAKFAST.  ? rosuvastatin (CRESTOR) 20 MG tablet Take 1 tablet (20 mg total) by mouth daily. for cholesterol.  ? ? ?Allergies:  Patient has no known allergies. ? ?Social History  ? ?Tobacco Use  ? Smoking status: Never  ? Smokeless tobacco: Never  ?Vaping Use  ? Vaping Use: Never used  ?Substance Use Topics  ? Alcohol use: Not Currently  ?  Alcohol/week: 0.0 standard drinks  ?  Comment: rarely  ? Drug use: No  ? ? ?Family History  ?Problem Relation Age of Onset  ? Cancer Mother   ?     ovarian/uterine  ? Heart disease Mother   ? Heart attack Mother   ? Stroke Mother   ? Colon cancer Father   ? Dementia Father   ? COPD Brother   ? Congestive Heart Failure Brother   ? Stroke Daughter   ? Heart attack Daughter   ? Suicidality Brother   ? Breast cancer Neg Hx   ? ? ?Review of Systems: ?A 12-system review of systems was performed and was negative except as noted in the HPI. ? ?-------------------------------------------------------------------------------------------------- ? ?Physical Exam: ?BP 120/72 (BP Location: Left Arm, Patient Position: Sitting, Cuff Size: Normal)   Pulse (!) 55   Ht '5\' 4"'$  (1.626 m)   Wt 150 lb (68 kg)   SpO2 99%   BMI 25.75 kg/m?  ? ?General:  NAD. ?Neck: No JVD or HJR. ?Lungs: Clear to auscultation bilaterally without wheezes or crackles. ?Heart: Bradycardic but regular without murmurs, rubs, or gallops. ?Abdomen: Soft, nontender, nondistended. ?Extremities: Trace pretibial edema, left greater than right. ? ?EKG: Sinus bradycardia (heart rate 55 bpm).  Otherwise, no significant abnormality.  No change from prior tracing on 07/26/2021. ? ?Lab Results  ?Component Value Date  ? WBC 4.9 05/11/2021  ? HGB 11.8 05/11/2021  ? HCT 35.6 05/11/2021  ? MCV 89.2 05/11/2021  ? PLT 210 05/11/2021  ? ? ?Lab Results  ?Component Value Date  ? NA 135 12/12/2021  ? K 3.9 12/12/2021  ? CL 99 12/12/2021  ? CO2 30 12/12/2021  ? BUN 20 12/12/2021  ? CREATININE 0.84 12/12/2021  ? GLUCOSE 182 (H) 12/12/2021  ? ALT 18 11/08/2021  ? ? ?Lab Results  ?Component Value Date  ? CHOL 169 06/28/2021  ? HDL 56.60 06/28/2021  ? [place] 87 06/28/2021  ?  LDLDIRECT 151.7 08/16/2013  ? TRIG 128.0 06/28/2021  ? CHOLHDL 3 06/28/2021  ? ? ?-------------------------------------------------------------------------------------------------- ? ?ASSESSMENT AND PLAN: ?Dyspnea on exertion chronic diastolic heart failure: ?This is likely multifactorial, including an element of diastolic dysfunction and deconditioning.  Ms. Senk appears euvolemic on exam today other than trace pretibial edema that may be exacerbated by amlodipine use.  Symptoms are consistent with NYHA class II.  We will decrease amlodipine to 2.5 mg daily and continue current regimen of losartan-HCTZ.  I do not think her mild-moderate CAD explains her exertional dyspnea.  I encouraged her to increase her activity as tolerated.  If symptoms persist, we could consider adding an SGLT2 inhibitor in the future. ? ?Coronary artery disease: ?No angina reported.  Coronary CTA last fall showed mild-moderate, nonobstructive disease.  We will plan to continue aspirin and rosuvastatin to prevent progression of disease. ? ?Carotid artery stenosis: ?No  new symptoms reported.  Continue aspirin and statin therapy as well as blood pressure control.  Continue follow-up with vascular surgery. ? ?Hypertension: ?Blood pressure today is normal and improved from prior readings.  Given orthostatic lightheadedness and mild leg swelling, we have agreed to decrease amlodipine to 2.5 mg daily.  We will continue losartan-HCTZ 100/25 mg daily.  I have asked Ms. Lady to monitor her blood pressure regularly at home and to alert [place] if it is consistently above 140/90. ? ?Hyperlipidemia: ?LDL just above goal on last check in 06/2021 (goal less than 70 in the setting of CAD and carotid artery stenosis).  We discussed escalation of rosuvastatin to 40 mg daily but have agreed to defer this in favor of lifestyle modifications. ? ?Follow-up: Return to clinic in 6 months. ? ?Nelva Bush, MD ?01/09/2022 ?9:41 AM ? ?

## 2022-01-09 ENCOUNTER — Other Ambulatory Visit: Payer: Self-pay

## 2022-01-09 ENCOUNTER — Ambulatory Visit (INDEPENDENT_AMBULATORY_CARE_PROVIDER_SITE_OTHER): Payer: Medicare Other | Admitting: Internal Medicine

## 2022-01-09 ENCOUNTER — Encounter: Payer: Self-pay | Admitting: Internal Medicine

## 2022-01-09 VITALS — BP 120/72 | HR 55 | Ht 64.0 in | Wt 150.0 lb

## 2022-01-09 DIAGNOSIS — R0609 Other forms of dyspnea: Secondary | ICD-10-CM | POA: Diagnosis not present

## 2022-01-09 DIAGNOSIS — I10 Essential (primary) hypertension: Secondary | ICD-10-CM | POA: Diagnosis not present

## 2022-01-09 DIAGNOSIS — I6523 Occlusion and stenosis of bilateral carotid arteries: Secondary | ICD-10-CM | POA: Diagnosis not present

## 2022-01-09 DIAGNOSIS — E785 Hyperlipidemia, unspecified: Secondary | ICD-10-CM | POA: Diagnosis not present

## 2022-01-09 DIAGNOSIS — I5032 Chronic diastolic (congestive) heart failure: Secondary | ICD-10-CM | POA: Insufficient documentation

## 2022-01-09 DIAGNOSIS — I2510 Atherosclerotic heart disease of native coronary artery without angina pectoris: Secondary | ICD-10-CM | POA: Diagnosis not present

## 2022-01-09 MED ORDER — AMLODIPINE BESYLATE 2.5 MG PO TABS: 2.5000 mg | ORAL_TABLET | Freq: Every day | ORAL | 2 refills | Status: DC

## 2022-01-09 NOTE — Patient Instructions (Signed)
Medication Instructions:  ? ?Your physician has recommended you make the following change in your medication:  ? ?DECREASE Amlodipine 2.5 mg daily  ? ?*If you need a refill on your cardiac medications before your next appointment, please call your pharmacy* ? ? ?Lab Work: ? ?None ordered  ? ?Testing/Procedures: ? ?None ordered ? ? ?Follow-Up: ?At [facility], you and your health needs are our priority.  As part of our continuing mission to provide you with exceptional heart care, we have created designated Provider Care Teams.  These Care Teams include your primary Cardiologist (physician) and Advanced Practice Providers (APPs -  Physician Assistants and Nurse Practitioners) who all work together to provide you with the care you need, when you need it. ? ?We recommend signing up for the patient portal called "MyChart".  Sign up information is provided on this After Visit Summary.  MyChart is used to connect with patients for Virtual Visits (Telemedicine).  Patients are able to view lab/test results, encounter notes, upcoming appointments, etc.  Non-urgent messages can be sent to your provider as well.   ?To learn more about what you can do with MyChart, go to [email or web address].   ? ?Your next appointment:   ?6 month(s) ? ?The format for your next appointment:   ?In Person ? ?Provider:   ?You may see Nelva Bush, MD or one of the following Advanced Practice Providers on your designated Care Team:   ?Murray Hodgkins, NP ?Christell Faith, PA-C ?Cadence Kathlen Mody, PA-C ?

## 2022-01-10 ENCOUNTER — Encounter: Payer: Self-pay | Admitting: Internal Medicine

## 2022-01-10 ENCOUNTER — Ambulatory Visit (INDEPENDENT_AMBULATORY_CARE_PROVIDER_SITE_OTHER): Payer: Medicare Other | Admitting: Internal Medicine

## 2022-01-10 VITALS — BP 146/84 | HR 85 | Temp 98.2°F | Ht 64.0 in | Wt 149.2 lb

## 2022-01-10 DIAGNOSIS — R0609 Other forms of dyspnea: Secondary | ICD-10-CM | POA: Diagnosis not present

## 2022-01-10 DIAGNOSIS — R739 Hyperglycemia, unspecified: Secondary | ICD-10-CM

## 2022-01-10 DIAGNOSIS — I779 Disorder of arteries and arterioles, unspecified: Secondary | ICD-10-CM

## 2022-01-10 DIAGNOSIS — I6523 Occlusion and stenosis of bilateral carotid arteries: Secondary | ICD-10-CM

## 2022-01-10 DIAGNOSIS — I700 Atherosclerosis of aorta: Secondary | ICD-10-CM

## 2022-01-10 DIAGNOSIS — F439 Reaction to severe stress, unspecified: Secondary | ICD-10-CM | POA: Diagnosis not present

## 2022-01-10 DIAGNOSIS — I5032 Chronic diastolic (congestive) heart failure: Secondary | ICD-10-CM | POA: Diagnosis not present

## 2022-01-10 DIAGNOSIS — H40013 Open angle with borderline findings, low risk, bilateral: Secondary | ICD-10-CM | POA: Diagnosis not present

## 2022-01-10 DIAGNOSIS — I10 Essential (primary) hypertension: Secondary | ICD-10-CM | POA: Diagnosis not present

## 2022-01-10 DIAGNOSIS — E785 Hyperlipidemia, unspecified: Secondary | ICD-10-CM | POA: Diagnosis not present

## 2022-01-10 MED ORDER — GABAPENTIN 100 MG PO CAPS: 100.0000 mg | ORAL_CAPSULE | Freq: Every day | ORAL | 1 refills | Status: DC

## 2022-01-10 NOTE — Progress Notes (Signed)
Patient ID: Kristen Strickland, female   DOB: [birth year], 76 y.o.   MRN: [id number]   Subjective:    Patient ID: Kristen Strickland, female    DOB: [birth year], 76 y.o.   MRN: [id number]  This visit occurred during the SARS-CoV-2 public health emergency.  Safety protocols were in place, including screening questions prior to the visit, additional usage of staff PPE, and extensive cleaning of exam room while observing appropriate contact time as indicated for disinfecting solutions.   Patient here for a scheduled follow up.   Chief Complaint  Patient presents with   Follow-up    [id number] - requesting rx for gabapentin   .   HPI Here to follow up regarding her blood pressure, cholesterol and previous abdominal discomfort. Evaluated yesterday (Dr End) - exertional dyspnea with inclines.  Felt to be multifactorial - diastolic dysfunction and deconditioning.  Amlodipine was decreased to 2.530mq day - trace pre tibial edema.  Previous CTA - mild/moderate - non obstructive disease.  Continues on aspirin and crestor.  At visit yesterday, also reported some mild light headedness.  Medication adjusted as above.  No chest pain.  No sob reported.  The explosive discomfort (abdomen) - better.  Bowels are moving - has bowel movement one time per day in am.  Discussed fiber to keep bowels moving. Request refill gabapentin - to help with knee/leg pain.  Is better.     Past Medical History:  Diagnosis Date   Arthritis    Carotid arterial disease ([facility]   a. 11/2017 Carotid U/S: RICA 10-28-09%LICA 4057-26%   Chronic fatigue    Chronic leg pain    DDD (degenerative disc disease), lumbar    Dyspnea on exertion    a. 02/2017 Echo: EF 60-65%, no rwma, mild MR. Nl RV size/fxn. Nl PASP; b. 02/2017 MV: small, mild, fixed basal inferolateral defect - ? artifact vs scar. No ischemia. EF >65%.   Hypercholesterolemia    Hypertension    Insomnia    Palpitations    a. 02/2017 Holter: Avg HR 66 (51-115), rare PACs, four brief runs  of PAT - up to 5 beats, max rate 157. Rare isolated PVCs. No sustained arrhythmias; b. 03/2018 24h Holter: Rare PACs, 3 beat run PAT (122 bpm), PVCs (2% of beats).   Radiculopathy    Lumbar region   Sinus bradycardia    Spondylolisthesis    lumbosacral region   Past Surgical History:  Procedure Laterality Date   ABDOMINAL EXPOSURE N/A 11/10/2018   Procedure: ABDOMINAL EXPOSURE;  Surgeon: DiAngelia MouldMD;  [facility];  Service: Vascular;  Laterality: N/A;   AB[place]OMP/DISCECTOMY FUSION N/A 04/09/2013   Procedure: ANTERIOR CERVICAL DECOMPRESSION/DISCECTOMY FUSION 2 LEVELS;  Surgeon: ErFloyce StakesMD;  Location: [facility];  Service: Neurosurgery;  Laterality: N/A;  Cervical five-six Cervical six-seven  Anterior cervical decompression/diskectomy/fusion   ANTERIOR LUMBAR FUSION N/A 11/10/2018   Procedure: Anterior Lumbar Interbody Fusion-Lumbar five-Sacral one;  Surgeon: PoEarnie LarssonMD;  [facility]: [facility];  Laterality: N/A;   BACK SURGERY     ruptured disc   RECONSTRUCTION OF NOSE     Family History  Problem Relation Age of Onset   Cancer Mother        ovarian/uterine   Heart disease Mother    Heart attack Mother    Stroke Mother    Colon cancer Father    Dementia Father  COPD Brother    Congestive Heart Failure Brother    Stroke Daughter    Heart attack Daughter    Suicidality Brother    Breast cancer Neg Hx    Social History   Socioeconomic History   Marital status: Married    Spouse name: Not on file   Number of children: 2   Years of education: Not on file   Highest education level: Not on file  Occupational History   Not on file  Tobacco Use   Smoking status: Never   Smokeless tobacco: Never  Vaping Use   Vaping Use: Never used  Substance and Sexual Activity   Alcohol use: Not Currently    Alcohol/week: 0.0 standard drinks    Comment: rarely   Drug use: No   Sexual  activity: Yes  Other Topics Concern   Not on file  Social History Narrative   Not on file   Social Determinants of Health   Financial Resource Strain: Low Risk    Difficulty of Paying Living Expenses: Not hard at all  Food Insecurity: No Food Insecurity   Worried About Charity fundraiser in the Last Year: Never true   [place] in the Last Year: Never true  Transportation Needs: No Transportation Needs   Lack of Transportation (Medical): No   Lack of Transportation (Non-Medical): No  Physical Activity: Sufficiently Active   Days of Exercise per Week: 5 days   Minutes of Exercise per Session: 30 min  Stress: No Stress Concern Present   Feeling of Stress : Not at all  Social Connections: Unknown   Frequency of Communication with Friends and Family: More than three times a week   Frequency of Social Gatherings with Friends and Family: More than three times a week   Attends Religious Services: Not on Electrical engineer or Organizations: Not on file   Attends Archivist Meetings: Not on file   Marital Status: Not on file     Review of Systems  Constitutional:  Negative for appetite change and unexpected weight change.  HENT:  Negative for congestion and sinus pressure.   Respiratory:  Negative for cough, chest tightness and shortness of breath.   Cardiovascular:  Negative for chest pain and palpitations.       No increased leg swelling.   Gastrointestinal:  Negative for diarrhea, nausea and vomiting.       Previous abdominal discomfort is better.   Genitourinary:  Negative for difficulty urinating and dysuria.  Musculoskeletal:  Negative for joint swelling and myalgias.  Skin:  Negative for color change and rash.  Neurological:  Negative for dizziness, light-headedness and headaches.  Psychiatric/Behavioral:  Negative for agitation and dysphoric mood.       Objective:     BP (!) 146/84   Pulse 85   Temp 98.2 F (36.8 C) (Oral)   Ht _0   (1.626 m)   Wt 149 lb 3.2 oz (67.7 kg)   SpO2 98%   BMI 25.61 kg/m  Wt Readings from Last 3 Encounters:  01/10/22 149 lb 3.2 oz (67.7 kg)  01/09/22 150 lb (68 kg)  11/08/21 145 lb (65.8 kg)    Physical Exam Vitals reviewed.  Constitutional:      General: She is not in acute distress.    Appearance: Normal appearance.  HENT:     Head: Normocephalic and atraumatic.     Right Ear: External ear normal.  Left Ear: External ear normal.  Eyes:     General: No scleral icterus.       Right eye: No discharge.        Left eye: No discharge.     Conjunctiva/sclera: Conjunctivae normal.  Neck:     Thyroid: No thyromegaly.  Cardiovascular:     Rate and Rhythm: Normal rate and regular rhythm.  Pulmonary:     Effort: No respiratory distress.     Breath sounds: Normal breath sounds. No wheezing.  Abdominal:     General: Bowel sounds are normal.     Palpations: Abdomen is soft.     Tenderness: There is no abdominal tenderness.  Musculoskeletal:        General: No swelling or tenderness.     Cervical back: Neck supple. No tenderness.  Lymphadenopathy:     Cervical: No cervical adenopathy.  Skin:    Findings: No erythema or rash.  Neurological:     Mental Status: She is alert.  Psychiatric:        Mood and Affect: Mood normal.        Behavior: Behavior normal.     Outpatient Encounter Medications as of 01/10/2022  Medication Sig   amLODipine (NORVASC) 2.5 MG tablet Take 1 tablet (2.5 mg total) by mouth daily.   aspirin EC 81 MG tablet Take 81 mg by mouth daily.   losartan-hydrochlorothiazide (HYZAAR) 100-25 MG tablet Take 1 tablet by mouth daily.   meloxicam (MOBIC) 7.5 MG tablet Take 1 tablet by mouth daily.   Multiple Vitamins-Minerals (CENTRUM ADULTS PO) Take 1 capsule by mouth 1 day or 1 dose.   pantoprazole (PROTONIX) 40 MG tablet TAKE (1) TABLET BY MOUTH DAILY 1 HOUR BEFORE BREAKFAST.   rosuvastatin (CRESTOR) 20 MG tablet Take 1 tablet (20 mg total) by mouth daily. for  cholesterol.   [DISCONTINUED] gabapentin (NEURONTIN) 100 MG capsule Take 100 mg by mouth daily.   gabapentin (NEURONTIN) 100 MG capsule Take 1 capsule (100 mg total) by mouth daily.   No facility-administered encounter medications on file as of 01/10/2022.     Lab Results  Component Value Date   WBC 4.9 05/11/2021   HGB 11.8 05/11/2021   HCT 35.6 05/11/2021   PLT 210 05/11/2021   GLUCOSE 182 (H) 12/12/2021   CHOL 169 06/28/2021   TRIG 128.0 06/28/2021   HDL 56.60 06/28/2021   LDLDIRECT 151.7 08/16/2013   LDLCALC 87 06/28/2021   ALT 18 11/08/2021   AST 17 11/08/2021   NA 135 12/12/2021   K 3.9 12/12/2021   CL 99 12/12/2021   CREATININE 0.84 12/12/2021   BUN 20 12/12/2021   CO2 30 12/12/2021   TSH 2.80 05/11/2021   HGBA1C 6.0 11/08/2021    MM 3D SCREEN BREAST BILATERAL  Result Date: 01/01/2022 CLINICAL DATA:  Screening. EXAM: DIGITAL SCREENING BILATERAL MAMMOGRAM WITH TOMOSYNTHESIS AND CAD TECHNIQUE: Bilateral screening digital craniocaudal and mediolateral oblique mammograms were obtained. Bilateral screening digital breast tomosynthesis was performed. The images were evaluated with computer-aided detection. COMPARISON:  Previous exam(s). ACR Breast Density Category c: The breast tissue is heterogeneously dense, which may obscure small masses. FINDINGS: There are no findings suspicious for malignancy. IMPRESSION: No mammographic evidence of malignancy. A result letter of this screening mammogram will be mailed directly to the patient. RECOMMENDATION: Screening mammogram in one year. (Code:SM-B-01Y) BI-RADS CATEGORY  1: Negative. Electronically Signed   By: Lovey Newcomer M.D.   On: 01/01/2022 10:48       Assessment &  Plan:   Problem List Items Addressed This Visit     Aortic atherosclerosis ([place])    Continue crestor.       Carotid artery disease ([place])    Continue f/u with vascular surgery.  Continue risk factor modification.       Chronic diastolic heart failure ([place])     Just saw cardiology yesterday.  Amlodipine decreased yesterday - 2.55m q day.  Follow pressures.       DOE (dyspnea on exertion)    Saw cardiology yesterday. Felt to be multifactorial - diastolic dysfunction and deconditioning.   Amlodipine dose adjusted.  Follow.       Hyperglycemia    Low carb diet and exercise.  Follow met b and a1c.       Relevant Orders   HgB A1c   Hyperlipidemia LDL goal <70    On crestor.  Low cholesterol diet and exercise.  Follow lipid panel and liver function tests.        Relevant Orders   CBC w/Diff   Hepatic function panel   Lipid Profile   TSH   Hypertension - Primary    Blood pressure as outlined.  Continue losartan/hctz and amlodipine.  Amlodipine dose was just adjusted yesterday.  States she is nervous today.  Blood pressure a little elevated.  Was ok yesterday with cardiology.  Continue current medication regimen.  Follow pressures.  Follow metabolic panel.       Relevant Orders   Basic Metabolic Panel (BMET)   Stress    Overall appears to be handling things well. Follow.         CEinar Pheasant MD

## 2022-01-10 NOTE — Assessment & Plan Note (Signed)
Blood pressure as outlined.  Continue losartan/hctz and amlodipine.  Amlodipine dose was just adjusted yesterday.  States she is nervous today.  Blood pressure a little elevated.  Was ok yesterday with cardiology.  Continue current medication regimen.  Follow pressures.  Follow metabolic panel.  ?

## 2022-01-13 ENCOUNTER — Encounter: Payer: Self-pay | Admitting: Internal Medicine

## 2022-01-13 NOTE — Assessment & Plan Note (Signed)
Low carb diet and exercise.  Follow met b and a1c.  ?

## 2022-01-13 NOTE — Assessment & Plan Note (Signed)
On crestor.  Low cholesterol diet and exercise.  Follow lipid panel and liver function tests.   

## 2022-01-13 NOTE — Assessment & Plan Note (Signed)
Just saw cardiology yesterday.  Amlodipine decreased yesterday - 2.'5mg'$  q day.  Follow pressures.  ?

## 2022-01-13 NOTE — Assessment & Plan Note (Signed)
Saw cardiology yesterday. Felt to be multifactorial - diastolic dysfunction and deconditioning.   Amlodipine dose adjusted.  Follow.  ?

## 2022-01-13 NOTE — Assessment & Plan Note (Signed)
Overall appears to be handling things well.  Follow.  ?

## 2022-01-13 NOTE — Assessment & Plan Note (Signed)
Continue crestor 

## 2022-01-13 NOTE — Assessment & Plan Note (Signed)
Continue f/u with vascular surgery.  Continue risk factor modification.  ?

## 2022-01-28 ENCOUNTER — Other Ambulatory Visit: Payer: Self-pay | Admitting: Internal Medicine

## 2022-02-10 ENCOUNTER — Ambulatory Visit
Admission: EM | Admit: 2022-02-10 | Discharge: 2022-02-10 | Disposition: A | Discharge status: Home / Self Care | Payer: Medicare Other

## 2022-02-10 ENCOUNTER — Other Ambulatory Visit: Payer: Self-pay

## 2022-02-10 DIAGNOSIS — R21 Rash and other nonspecific skin eruption: Secondary | ICD-10-CM

## 2022-02-10 NOTE — ED Triage Notes (Signed)
Patient is here for "possible shingles". Rash/bumps on feet (ulcerated lesions) both on inside of feet. ? History of varicella. Previous time of shingles "was on feet".  ?

## 2022-02-10 NOTE — Discharge Instructions (Signed)
The rash does not look like you have shingles  ?

## 2022-02-10 NOTE — ED Provider Notes (Signed)
[facility]    CSN: [id number] Arrival date & time: 02/10/22  1340      History   Chief Complaint Chief Complaint  Patient presents with   Rash    HPI Kristen Strickland is a 76 y.o. female who presents with rash on medial feet. She states she saw 2 blister like lesions on R medial foot last two nights ago. Has it on L medial foot as well. She had Shingles on lateral L foot a few years ago, and want to make sure is not this. She was wearing the same brand sandals that she always wears the day she noticed this, and had been sweating a lot. She could not get comfortable with her blankets last night, since it seemed to irritate her when touching the R side.     Past Medical History:  Diagnosis Date   Arthritis    Carotid arterial disease ([place])    a. 11/2017 Carotid U/S: RICA 1-69%, LICA 45-03%.    Chronic fatigue    Chronic leg pain    DDD (degenerative disc disease), lumbar    Dyspnea on exertion    a. 02/2017 Echo: EF 60-65%, no rwma, mild MR. Nl RV size/fxn. Nl PASP; b. 02/2017 MV: small, mild, fixed basal inferolateral defect - ? artifact vs scar. No ischemia. EF >65%.   Hypercholesterolemia    Hypertension    Insomnia    Palpitations    a. 02/2017 Holter: Avg HR 66 (51-115), rare PACs, four brief runs of PAT - up to 5 beats, max rate 157. Rare isolated PVCs. No sustained arrhythmias; b. 03/2018 24h Holter: Rare PACs, 3 beat run PAT (122 bpm), PVCs (2% of beats).   Radiculopathy    Lumbar region   Sinus bradycardia    Spondylolisthesis    lumbosacral region    Patient Active Problem List   Diagnosis Date Noted   Chronic diastolic heart failure ([place]) 01/09/2022   Coronary artery disease involving native coronary artery of native heart without angina pectoris 01/09/2022   Mitral valve insufficiency 07/26/2021   Precordial pain 07/26/2021   Calculus of gallbladder without cholecystitis without obstruction 07/26/2021   Aortic atherosclerosis ([place]) 05/18/2021   Body  mass index (BMI) 22.0-22.9, adult 08/31/2020   Arthrodesis status 08/10/2020   Abdominal pain 05/24/2020   Spondylolisthesis, lumbosacral region 02/02/2020   History of lumbar fusion 11/30/2019   Bilateral leg pain 08/13/2019   Sleeping difficulties 06/29/2019   Left foot pain 05/30/2019   Hyperglycemia 05/30/2019   PAC (premature atrial contraction) 01/27/2019   PVC (premature ventricular contraction) 01/27/2019   Left leg swelling 01/27/2019   Bilateral carotid artery stenosis 01/27/2019   Degenerative spondylolisthesis 11/10/2018   Decreased sense of taste 10/12/2018   Impingement syndrome, shoulder, right 04/03/2018   Swelling of right hand 11/29/2017   Fatigue 11/29/2017   Palpitations 06/11/2017   DOE (dyspnea on exertion) 09/30/2016   Carotid artery disease ([place]) 09/17/2015   Back pain 08/13/2015   Health care maintenance 02/08/2015   Stress 07/03/2014   Arthritis, degenerative 05/03/2014   Right knee pain 03/27/2014   Menopausal symptoms 02/27/2014   Dizziness 01/08/2014   Right shoulder pain 04/26/2013   Neck pain 04/07/2013   Hypertension 08/23/2012   Hyperlipidemia LDL goal <70 08/23/2012    Past Surgical History:  Procedure Laterality Date   ABDOMINAL EXPOSURE N/A 11/10/2018   Procedure: ABDOMINAL EXPOSURE;  Surgeon: Angelia Mould, MD;  [facility];  Service: Vascular;  Laterality: N/A;  ABDOMINAL HYSTERECTOMY  1975   ANTERIOR CERVICAL DECOMP/DISCECTOMY FUSION N/A 04/09/2013   Procedure: ANTERIOR CERVICAL DECOMPRESSION/DISCECTOMY FUSION 2 LEVELS;  Surgeon: Floyce Stakes, MD;  Location: [facility];  Service: Neurosurgery;  Laterality: N/A;  Cervical five-six Cervical six-seven  Anterior cervical decompression/diskectomy/fusion   ANTERIOR LUMBAR FUSION N/A 11/10/2018   Procedure: Anterior Lumbar Interbody Fusion-Lumbar five-Sacral one;  Surgeon: Earnie Larsson, MD;  Location: [place];  Service: Neurosurgery;  Laterality: N/A;   BACK SURGERY      ruptured disc   RECONSTRUCTION OF NOSE      OB History   No obstetric history on file.      Home Medications    Prior to Admission medications   Medication Sig Start Date End Date Taking? Authorizing Provider  amLODipine (NORVASC) 2.5 MG tablet Take 1 tablet (2.5 mg total) by mouth daily. 01/09/22 10/06/22 Yes End, Harrell Gave, MD  aspirin EC 81 MG tablet Take 81 mg by mouth daily.   Yes [provider]  gabapentin (NEURONTIN) 100 MG capsule Take 1 capsule (100 mg total) by mouth daily. 01/10/22  Yes Einar Pheasant, MD  losartan-hydrochlorothiazide (HYZAAR) 100-25 MG tablet Take 1 tablet by mouth daily. 07/02/21  Yes Einar Pheasant, MD  meloxicam (MOBIC) 7.5 MG tablet Take 1 tablet by mouth daily.   Yes [provider]  Multiple Vitamins-Minerals (CENTRUM ADULTS PO) Take 1 capsule by mouth 1 day or 1 dose.   Yes [provider]  rosuvastatin (CRESTOR) 20 MG tablet TAKE 1 TABLET BY MOUTH ONCE DAILY FOR CHOLESTEROL 01/28/22  Yes End, Harrell Gave, MD  oseltamivir (TAMIFLU) 75 MG capsule  01/10/22   [provider]  pantoprazole (PROTONIX) 40 MG tablet TAKE (1) TABLET BY MOUTH DAILY 1 HOUR BEFORE BREAKFAST. 06/08/21   Burnard Hawthorne, FNP    Family History Family History  Problem Relation Age of Onset   Cancer Mother        ovarian/uterine   Heart disease Mother    Heart attack Mother    Stroke Mother    Colon cancer Father    Dementia Father    COPD Brother    Congestive Heart Failure Brother    Stroke Daughter    Heart attack Daughter    Suicidality Brother    Breast cancer Neg Hx     Social History Social History   Tobacco Use   Smoking status: Never   Smokeless tobacco: Never  Vaping Use   Vaping Use: Never used  Substance Use Topics   Alcohol use: Not Currently    Alcohol/week: 0.0 standard drinks    Comment: rarely   Drug use: No     Allergies   Patient has no known allergies.   Review of Systems Review of Systems   Skin:  Positive for color change and rash. Negative for wound.    Physical Exam Triage Vital Signs ED Triage Vitals  Enc Vitals Group     BP 02/10/22 1359 (!) 145/69     Pulse Rate 02/10/22 1359 68     Resp 02/10/22 1359 18     Temp 02/10/22 1359 98.4 F (36.9 C)     Temp Source 02/10/22 1359 Oral     SpO2 02/10/22 1359 99 %     Weight 02/10/22 1356 149 lb 4 oz (67.7 kg)     Height 02/10/22 1356 '5\' 4"'$  (1.626 m)     Head Circumference --      Peak Flow --  Pain Score 02/10/22 1353 0     Pain Loc --      Pain Edu? --      Excl. in [place]? --    No data found.  Updated Vital Signs BP (!) 145/69 (BP Location: Left Arm)   Pulse 68   Temp 98.4 F (36.9 C) (Oral)   Resp 18   Ht '5\' 4"'$  (1.626 m)   Wt 149 lb 4 oz (67.7 kg)   SpO2 99%   BMI 25.62 kg/m   Visual Acuity Right Eye Distance:   Left Eye Distance:   Bilateral Distance:    Right Eye Near:   Left Eye Near:    Bilateral Near:     Physical Exam Vitals and nursing note reviewed.  Constitutional:      General: She is not in acute distress.    Appearance: She is not toxic-appearing.  HENT:     Right Ear: External ear normal.     Left Ear: External ear normal.  Eyes:     General: No scleral icterus.    Conjunctiva/sclera: Conjunctivae normal.  Pulmonary:     Effort: Pulmonary effort is normal.  Musculoskeletal:     Cervical back: Neck supple.  Skin:    Findings: Rash present.     Comments: Confluent Petechia like rash on medial upper feet R>L at the same level where her sandal goes across. No vesicles or papules noted. The skin is a little dry on the R, but the L is normal skin. No rash seen anywhere else on feet. It is not painful to palpation. This area is not hot.   Neurological:     Mental Status: She is alert and oriented to person, place, and time.     Gait: Gait normal.  Psychiatric:        Mood and Affect: Mood normal.        Behavior: Behavior normal.        Thought Content: Thought content  normal.        Judgment: Judgment normal.     UC Treatments / Results  Labs (all labs ordered are listed, but only abnormal results are displayed) Labs Reviewed - No data to display  EKG   Radiology No results found.  Procedures Procedures (including critical care time)  Medications Ordered in UC Medications - No data to display  Initial Impression / Assessment and Plan / UC Course  I have reviewed the triage vital signs and the nursing notes. I do not believe this is shingles since there are no vesicles and is not painful.      Final Clinical Impressions(s) / UC Diagnoses   Final diagnoses:  Rash     Discharge Instructions      The rash does not look like you have shingles     ED Prescriptions   None    PDMP not reviewed this encounter.   Shelby Mattocks, PA-C 02/10/22 1431

## 2022-03-12 IMAGING — CT CT ABD-PELV W/ CM
1 of 3 series · 14 of 32 positions shown, 19 images · IV contrast (APPLIED)
Comparison: None.

CLINICAL DATA: Generalized abdominal pain, epigastric pain

EXAM:
CT ABDOMEN AND PELVIS WITH CONTRAST
TECHNIQUE: Multidetector CT imaging of the abdomen and pelvis was performed
using the standard protocol following bolus administration of
intravenous contrast.
CONTRAST:  100mL OMNIPAQUE IOHEXOL 300 MG/ML  SOLN

[Series 2: axial st · axial · 0.71mm/px · z∈[-897,-452]mm · 14 of 101 slices shown, 19 images]
[im 6/101  soft-tissue]
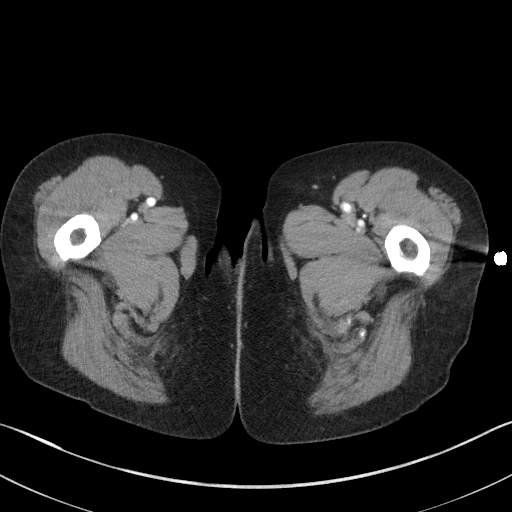
[im 6/101  bone]
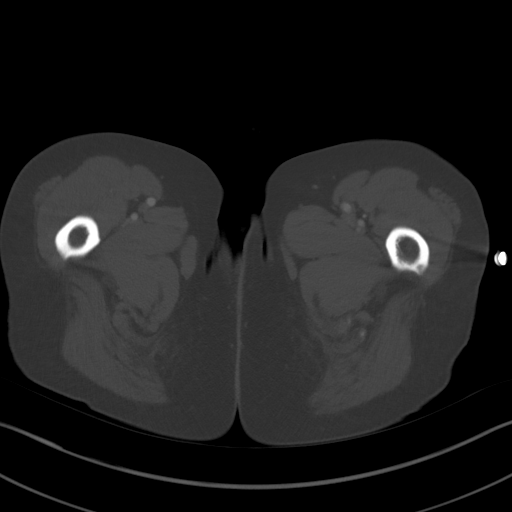
[im 12/101  soft-tissue]
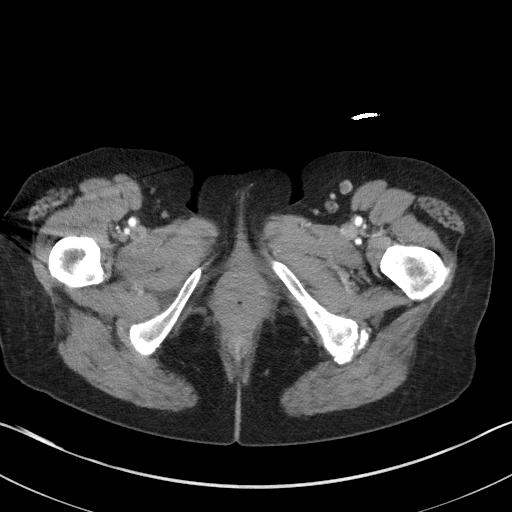
[im 24/101  soft-tissue]
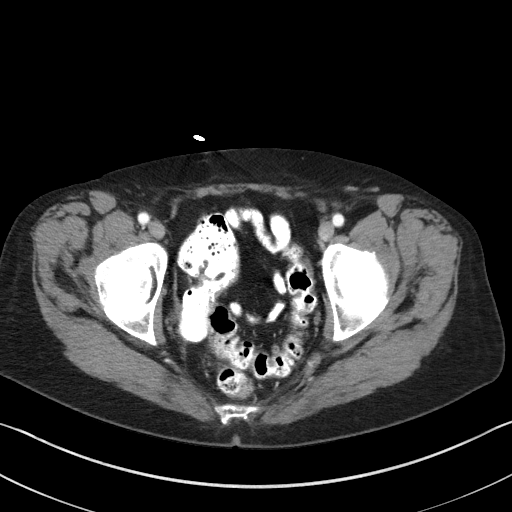
[im 30/101  soft-tissue]
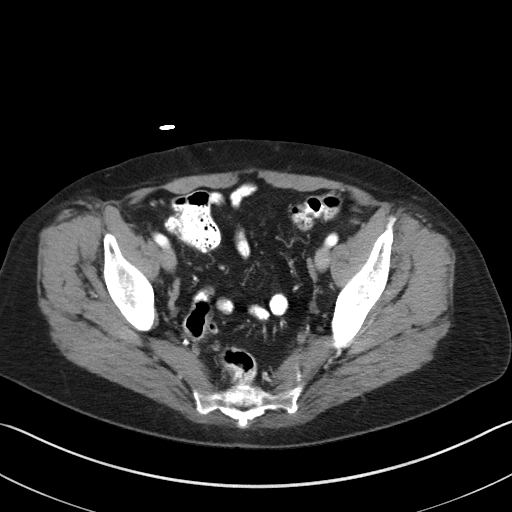
[im 36/101  soft-tissue]
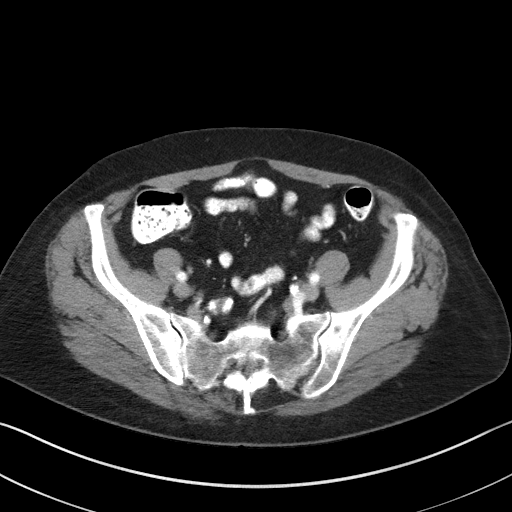
[im 42/101  soft-tissue]
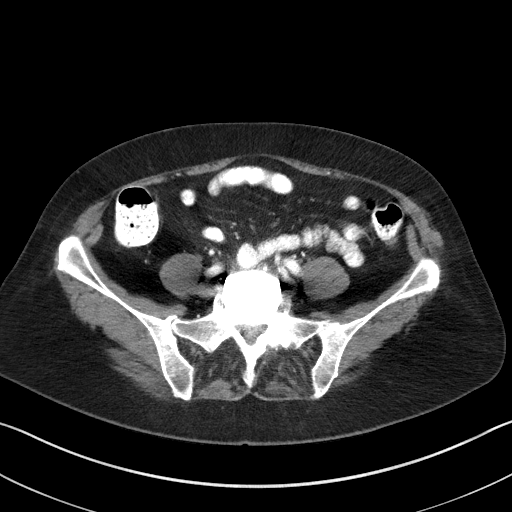
[im 53/101  soft-tissue]
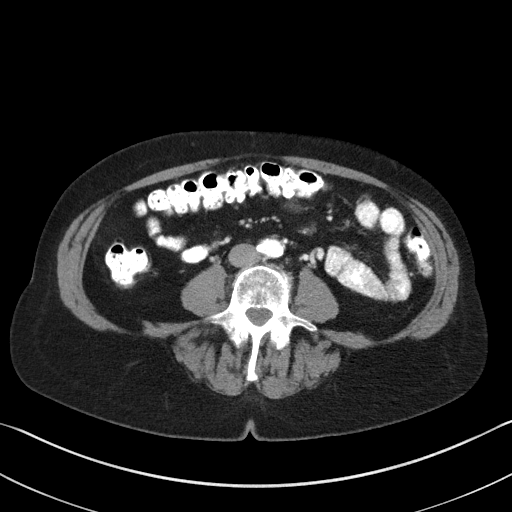
[im 59/101  soft-tissue]
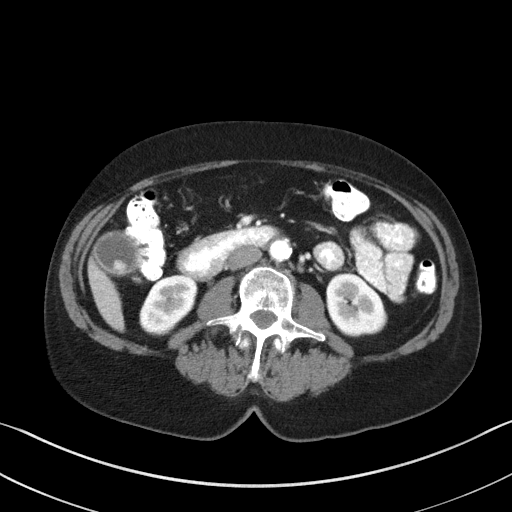
[im 65/101  soft-tissue]
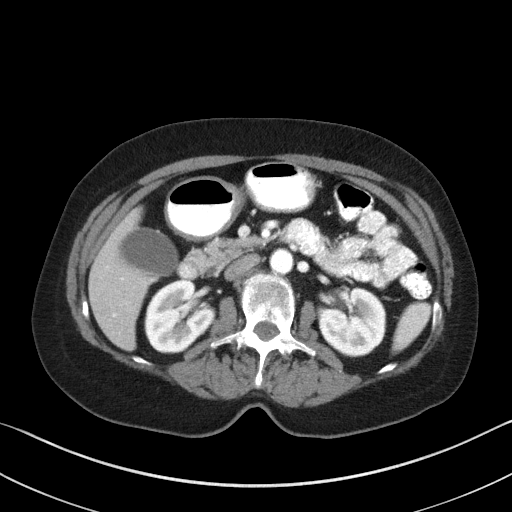
[im 65/101  bone]
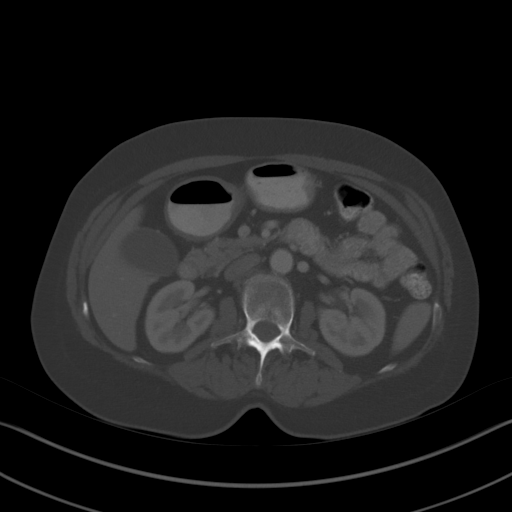
[im 71/101  soft-tissue]
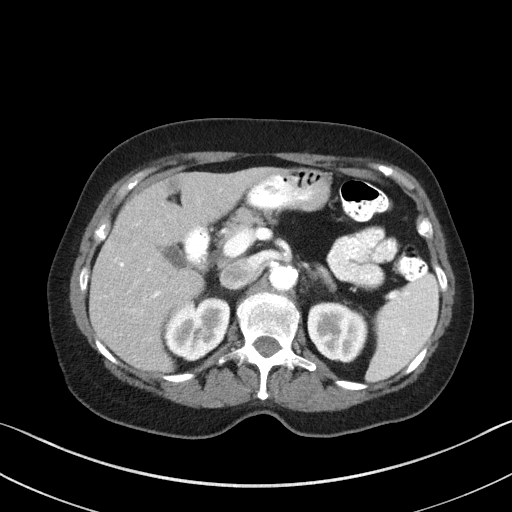
[im 77/101  soft-tissue]
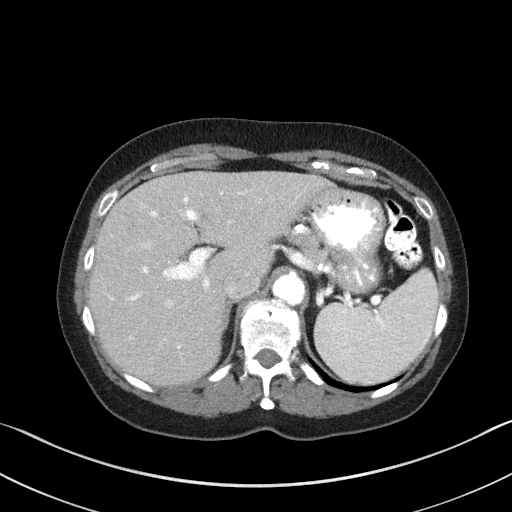
[im 77/101  lung]
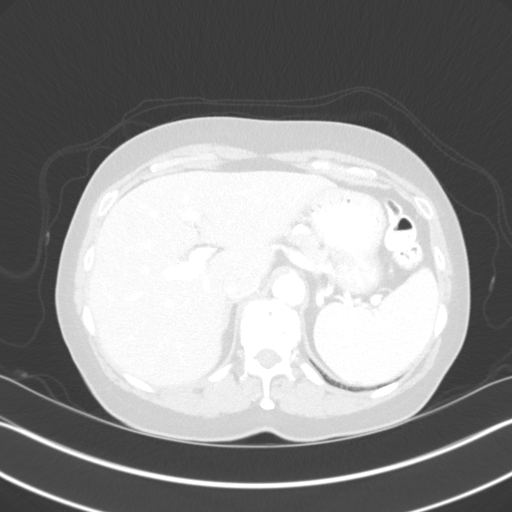
[im 83/101  lung]
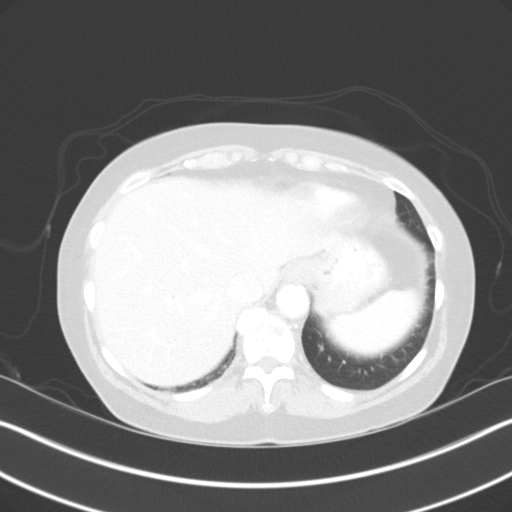
[im 89/101  soft-tissue]
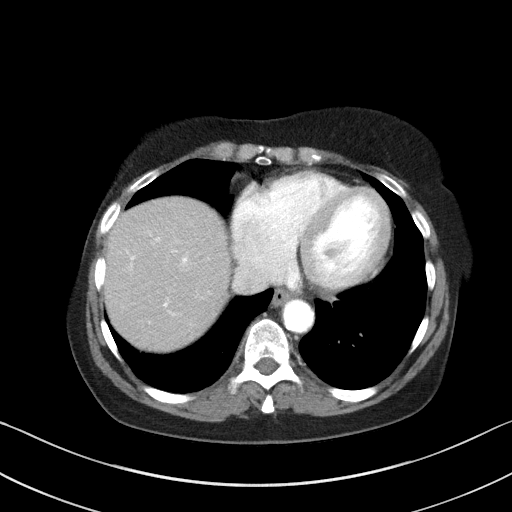
[im 89/101  lung]
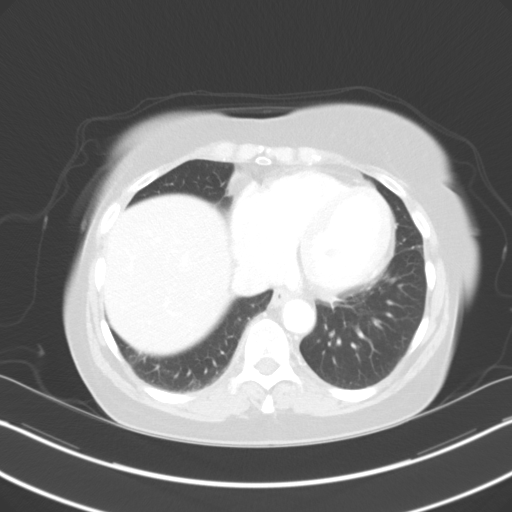
[im 95/101  soft-tissue]
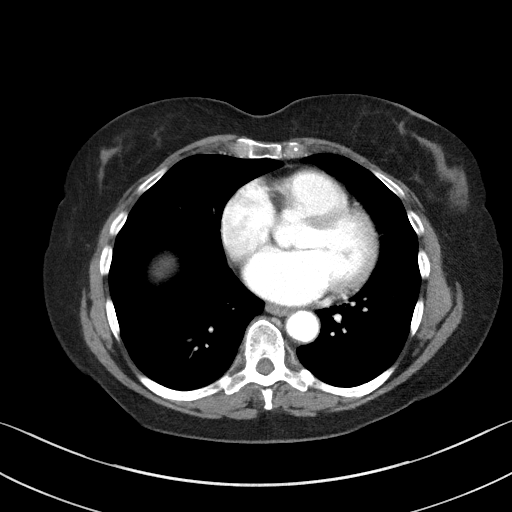
[im 95/101  lung]
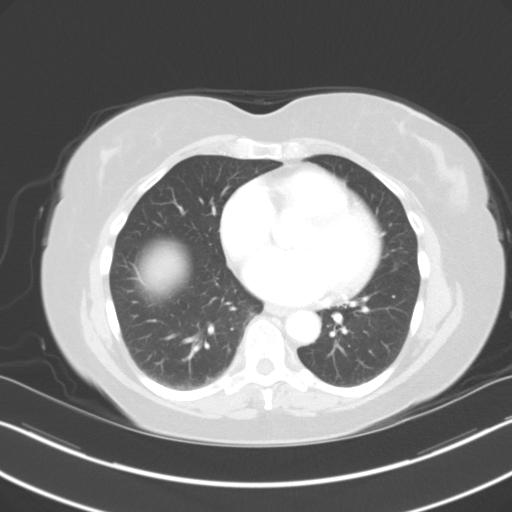

[14 of 32 positions shown; findings below may reference images not displayed]

FINDINGS: Lower chest: No acute abnormality.

Hepatobiliary: No focal liver abnormality. There is a 1 cm
gallstone. No pericholecystic fluid. No biliary dilatation.

Pancreas: Unremarkable. No pancreatic ductal dilatation or
surrounding inflammatory changes.

Spleen: Unremarkable.

Adrenals/Urinary Tract: Mild thickening of the left adrenal. Kidneys
are unremarkable. Partially distended bladder is unremarkable.

Stomach/Bowel: Stomach is within normal limits. Bowel is normal in
caliber. Appendix is not visualized.

Vascular/Lymphatic: Aortic atherosclerosis. No enlarged lymph nodes.

Reproductive: Status post hysterectomy. No adnexal masses.

Other: No free fluid.  Abdominal wall is unremarkable.

Musculoskeletal: Postoperative changes of a anterior fusion at
L5-S1. Degenerative changes of the included spine.
IMPRESSION: 1 cm gallstone.

No acute abnormality.

Aortic Atherosclerosis ([id number]-[id number].E).

## 2022-04-13 ENCOUNTER — Other Ambulatory Visit: Payer: Self-pay | Admitting: Internal Medicine

## 2022-04-18 ENCOUNTER — Telehealth: Payer: Self-pay | Admitting: Internal Medicine

## 2022-04-18 NOTE — Telephone Encounter (Signed)
Copied from [place] [id number]. Topic: Medicare AWV >> Apr 18, 2022 11:36 AM Devoria Glassing wrote: Reason for CRM: Called patient to schedule Annual Wellness Visit.  Please schedule with Nurse Health Advisor Denisa O'Brien-Blaney, LPN at [facility].  Please call [phone] ask for [facility]

## 2022-05-10 ENCOUNTER — Ambulatory Visit (INDEPENDENT_AMBULATORY_CARE_PROVIDER_SITE_OTHER): Payer: Medicare Other

## 2022-05-10 VITALS — Ht 64.0 in | Wt 149.0 lb

## 2022-05-10 DIAGNOSIS — Z0000 Encounter for general adult medical examination without abnormal findings: Secondary | ICD-10-CM | POA: Diagnosis not present

## 2022-05-10 NOTE — Patient Instructions (Addendum)
  Kristen Strickland , Thank you for taking time to come for your Medicare Wellness Visit. I appreciate your ongoing commitment to your health goals. Please review the following plan we discussed and let me know if I can assist you in the future.   These are the goals we discussed:  Goals       Patient Stated     Follow up with Primary Care Provider (pt-stated)      As needed.      Other     Reduce sugar intake        This is a list of the screening recommended for you and due dates:  Health Maintenance  Topic Date Due   Tetanus Vaccine  06/28/2022*   Pneumonia Vaccine (1 - PCV) 11/08/2022*   Flu Shot  05/21/2022   Colon Cancer Screening  03/04/2024   DEXA scan (bone density measurement)  Completed   Hepatitis C Screening: USPSTF Recommendation to screen - Ages 62-79 yo.  Completed   HPV Vaccine  Aged Out   Mammogram  Discontinued   COVID-19 Vaccine  Discontinued   Zoster (Shingles) Vaccine  Discontinued  *Topic was postponed. The date shown is not the original due date.

## 2022-05-10 NOTE — Progress Notes (Signed)
Subjective:   Kristen Strickland is a 76 y.o. female who presents for Medicare Annual (Subsequent) preventive examination.  Review of Systems    No ROS.  Medicare Wellness Virtual Visit.  Visual/audio telehealth visit, UTA vital signs.   See social history for additional risk factors.   Cardiac Risk Factors include: advanced age (>75mn, >>76women);hypertension     Objective:    Today's Vitals   05/10/22 1434  Weight: 149 lb (67.6 kg)  Height: '5\' 4"'$  (1.626 m)   Body mass index is 25.58 kg/m.     05/10/2022    2:39 PM 02/10/2022    1:58 PM 04/18/2021    3:06 PM 04/03/2020    1:30 PM 10/13/2019   10:27 AM 09/14/2019    5:32 PM 03/25/2019    9:22 AM  Advanced Directives  Does Patient Have a Medical Advance Directive? No No No No No No No  Would patient like information on creating a medical advance directive? No - Patient declined No - Patient declined No - Patient declined No - Patient declined   No - Patient declined    Current Medications (verified) Outpatient Encounter Medications as of 05/10/2022  Medication Sig   amLODipine (NORVASC) 2.5 MG tablet Take 1 tablet (2.5 mg total) by mouth daily.   aspirin EC 81 MG tablet Take 81 mg by mouth daily.   gabapentin (NEURONTIN) 100 MG capsule TAKE (1) CAPSULE BY MOUTH ONCE DAILY.   losartan-hydrochlorothiazide (HYZAAR) 100-25 MG tablet Take 1 tablet by mouth daily.   meloxicam (MOBIC) 7.5 MG tablet Take 1 tablet by mouth daily.   Multiple Vitamins-Minerals (CENTRUM ADULTS PO) Take 1 capsule by mouth 1 day or 1 dose.   oseltamivir (TAMIFLU) 75 MG capsule    pantoprazole (PROTONIX) 40 MG tablet TAKE (1) TABLET BY MOUTH DAILY 1 HOUR BEFORE BREAKFAST.   rosuvastatin (CRESTOR) 20 MG tablet TAKE 1 TABLET BY MOUTH ONCE DAILY FOR CHOLESTEROL   No facility-administered encounter medications on file as of 05/10/2022.   Allergies (verified) Patient has no known allergies.   History: Past Medical History:  Diagnosis Date   Arthritis     Carotid arterial disease ([facility]    a. 11/2017 Carotid U/S: RICA 15-63% LICA 487-56%    Chronic fatigue    Chronic leg pain    DDD (degenerative disc disease), lumbar    Dyspnea on exertion    a. 02/2017 Echo: EF 60-65%, no rwma, mild MR. Nl RV size/fxn. Nl PASP; b. 02/2017 MV: small, mild, fixed basal inferolateral defect - ? artifact vs scar. No ischemia. EF >65%.   Hypercholesterolemia    Hypertension    Insomnia    Palpitations    a. 02/2017 Holter: Avg HR 66 (51-115), rare PACs, four brief runs of PAT - up to 5 beats, max rate 157. Rare isolated PVCs. No sustained arrhythmias; b. 03/2018 24h Holter: Rare PACs, 3 beat run PAT (122 bpm), PVCs (2% of beats).   Radiculopathy    Lumbar region   Sinus bradycardia    Spondylolisthesis    lumbosacral region   Past Surgical History:  Procedure Laterality Date   ABDOMINAL EXPOSURE N/A 11/10/2018   Procedure: ABDOMINAL EXPOSURE;  Surgeon: DAngelia Mould MD;  [facility]  Service: Vascular;  Laterality: N/A;   [place]COMP/DISCECTOMY FUSION N/A 04/09/2013   Procedure: ANTERIOR CERVICAL DECOMPRESSION/DISCECTOMY FUSION 2 LEVELS;  Surgeon: EFloyce Stakes MD;  Location: [facility];  Service: Neurosurgery;  Laterality: N/A;  Cervical five-six Cervical six-seven  Anterior cervical decompression/diskectomy/fusion   ANTERIOR LUMBAR FUSION N/A 11/10/2018   Procedure: Anterior Lumbar Interbody Fusion-Lumbar five-Sacral one;  Surgeon: Earnie Larsson, MD;  Location: [place];  Service: Neurosurgery;  Laterality: N/A;   BACK SURGERY     ruptured disc   RECONSTRUCTION OF NOSE     Family History  Problem Relation Age of Onset   Cancer Mother        ovarian/uterine   Heart disease Mother    Heart attack Mother    Stroke Mother    Colon cancer Father    Dementia Father    COPD Brother    Congestive Heart Failure Brother    Stroke Daughter    Heart attack Daughter    Suicidality Brother    Breast  cancer Neg Hx    Social History   Socioeconomic History   Marital status: Married    Spouse name: Not on file   Number of children: 2   Years of education: Not on file   Highest education level: Not on file  Occupational History   Not on file  Tobacco Use   Smoking status: Never   Smokeless tobacco: Never  Vaping Use   Vaping Use: Never used  Substance and Sexual Activity   Alcohol use: Not Currently    Alcohol/week: 0.0 standard drinks of alcohol    Comment: rarely   Drug use: No   Sexual activity: Yes  Other Topics Concern   Not on file  Social History Narrative   Not on file   Social Determinants of Health   Financial Resource Strain: Low Risk  (05/10/2022)   Overall Financial Resource Strain (CARDIA)    Difficulty of Paying Living Expenses: Not hard at all  Food Insecurity: No Food Insecurity (05/10/2022)   Hunger Vital Sign    Worried About Running Out of Food in the Last Year: Never true    Ran Out of Food in the Last Year: Never true  Transportation Needs: No Transportation Needs (05/10/2022)   PRAPARE - Hydrologist (Medical): No    Lack of Transportation (Non-Medical): No  Physical Activity: Sufficiently Active (05/10/2022)   Exercise Vital Sign    Days of Exercise per Week: 5 days    Minutes of Exercise per Session: 30 min  Stress: No Stress Concern Present (05/10/2022)   [place]    Feeling of Stress : Not at all  Social Connections: Unknown (05/10/2022)   Social Connection and Isolation Panel [NHANES]    Frequency of Communication with Friends and Family: More than three times a week    Frequency of Social Gatherings with Friends and Family: More than three times a week    Attends Religious Services: Not on Advertising copywriter or Organizations: Not on file    Attends Archivist Meetings: Not on file    Marital Status: Not on file   Tobacco  Counseling Counseling given: Not Answered  Clinical Intake: Pre-visit preparation completed: Yes        Diabetes: No  How often do you need to have someone help you when you read instructions, pamphlets, or other written materials from your doctor or pharmacy?: 1 - Never  Interpreter Needed?: No    Activities of Daily Living    05/10/2022    2:40 PM  In your present state of health, do you  have any difficulty performing the following activities:  Hearing? 0  Vision? 0  Difficulty concentrating or making decisions? 0  Walking or climbing stairs? 0  Dressing or bathing? 0  Doing errands, shopping? 0  Preparing Food and eating ? N  Using the Toilet? N  In the past six months, have you accidently leaked urine? N  Do you have problems with loss of bowel control? N  Managing your Medications? N  Managing your Finances? N  Housekeeping or managing your Housekeeping? N   Patient Care Team: Einar Pheasant, MD as PCP - General (Internal Medicine) End, Harrell Gave, MD as PCP - Cardiology (Cardiology) Earnie Larsson, MD as Consulting Physician (Neurosurgery)  Indicate any recent Medical Services you may have received from other than [facility] providers in the past year (date may be approximate).     Assessment:   This is a routine wellness examination for Brookie.  Virtual Visit via Telephone Note  I connected with  Juel Burrow on 05/10/22 at  2:30 PM EDT by telephone and verified that I am speaking with the correct person using two identifiers.  Persons participating in the virtual visit: patient/Nurse Health Advisor   I discussed the limitations of performing an evaluation and management service by telehealth. We continued and completed visit with audio only. Some vital signs may be absent or patient reported.   Hearing/Vision screen Hearing Screening - Comments:: Patient has difficulty hearing the clarity of the words when listening to music. Followed by [facility]. She  does not wear hearing aids. Vision Screening - Comments:: Followed by [facility], Dr. Garner Gavel Wears corrective lenses They have seen their ophthalmologist in the last 12 months.   Dietary issues and exercise activities discussed: Current Exercise Habits: Home exercise routine Healthy diet Good water intake   Goals Addressed               This Visit's Progress     Patient Stated     Follow up with Primary Care Provider (pt-stated)        As needed.      Other     Reduce sugar intake         Depression Screen    05/10/2022    2:38 PM 01/10/2022    6:55 AM 06/28/2021    8:23 AM 04/18/2021    3:03 PM 04/03/2020    1:16 PM 03/25/2019    9:26 AM 03/20/2018    8:29 AM  PHQ 2/9 Scores  PHQ - 2 Score 0 0 0 0 0 1 0    Fall Risk    05/10/2022    2:40 PM 01/10/2022    6:55 AM 06/28/2021    8:23 AM 04/18/2021    3:06 PM 04/03/2020    1:31 PM  [place] in the past year? 0 0 0 0 0  Number falls in past yr:   0 0 0  Injury with Fall?   0 0 0  Risk for fall due to :  No Fall Risks     Follow up Falls evaluation completed Falls evaluation completed Falls evaluation completed Falls evaluation completed Falls evaluation completed   [place]: Home free of loose throw rugs in walkways, pet beds, electrical cords, etc? Yes  Adequate lighting in your home to reduce risk of falls? Yes   ASSISTIVE DEVICES UTILIZED TO PREVENT FALLS: Use of a cane, walker or w/c? No   TIMED UP  AND GO: Was the test performed? No .   Cognitive Function: Patient is alert and oriented x3.      03/20/2018    8:49 AM 03/19/2017    8:39 AM 03/12/2016   10:13 AM  MMSE - Mini Mental State Exam  Orientation to time '5 5 5  '$ Orientation to Place '5 5 5  '$ Registration '3 3 3  '$ Attention/ Calculation '4 3 5  '$ Attention/Calculation-comments  difficulty with simple calculation   Recall '1 1 2  '$ Language- name 2 objects '2 2 2  '$ Language- repeat '1 1 1  '$ Language- follow 3  step command '3 3 3  '$ Language- read & follow direction '1 1 1  '$ Write a sentence '1 1 1  '$ Copy design '1 1 1  '$ Total score '27 26 29        '$ 05/10/2022    4:18 PM 04/18/2021    3:25 PM 04/03/2020    1:32 PM 03/25/2019    9:30 AM  6CIT Screen  What Year? 0 points 0 points 0 points 0 points  What month? 0 points 0 points 0 points 0 points  What time? 0 points 0 points  0 points  Count back from 20 0 points  0 points 0 points  Months in reverse 0 points 0 points 0 points 0 points  Repeat phrase 0 points  2 points   Total Score 0 points      Immunizations Immunization History  Administered Date(s) Administered   Moderna Sars-Covid-2 Vaccination 12/24/2019, 01/28/2020   Screening Tests Health Maintenance  Topic Date Due   TETANUS/TDAP  06/28/2022 (Originally 04/24/1965)   Pneumonia Vaccine 9+ Years old (1 - PCV) 11/08/2022 (Originally 04/24/2012)   INFLUENZA VACCINE  05/21/2022   COLONOSCOPY (Pts 45-15yr Insurance coverage will need to be confirmed)  03/04/2024   DEXA SCAN  Completed   Hepatitis C Screening  Completed   HPV VACCINES  Aged Out   MAMMOGRAM  Discontinued   COVID-19 Vaccine  Discontinued   Zoster Vaccines- Shingrix  Discontinued   Health Maintenance There are no preventive care reminders to display for this patient.  Lung Cancer Screening: (Low Dose CT Chest recommended if Age 75-80years, 30 pack-year currently smoking OR have quit w/in 15years.) does not qualify.   Vision Screening: Recommended annual ophthalmology exams for early detection of glaucoma and other disorders of the eye.  Dental Screening: Recommended annual dental exams for proper oral hygiene  Community Resource Referral / Chronic Care Management: CRR required this visit?  No   CCM required this visit?  No      Plan:   Keep all routine maintenance appointments.   I have personally reviewed and noted the following in the patient's chart:   Medical and social history Use of alcohol, tobacco or  illicit drugs  Current medications and supplements including opioid prescriptions.  Functional ability and status Nutritional status Physical activity Advanced directives List of other physicians Hospitalizations, surgeries, and ER visits in previous 12 months Vitals Screenings to include cognitive, depression, and falls Referrals and appointments  In addition, I have reviewed and discussed with patient certain preventive protocols, quality metrics, and best practice recommendations. A written personalized care plan for preventive services as well as general preventive health recommendations were provided to patient.     OVarney Biles LPN   [id number]

## 2022-05-15 ENCOUNTER — Other Ambulatory Visit: Payer: Self-pay | Admitting: Internal Medicine

## 2022-06-05 IMAGING — CT CT HEART MORP W/ CTA COR W/ SCORE W/ CA W/CM &/OR W/O CM
1 of 14 series · 3 of 20 positions shown, 4 images · non-contrast
Comparison: CT AP 05/16/21

Addendum:
CLINICAL DATA: Dyspnea on exertion

EXAM:
Cardiac/Coronary  CTA
TECHNIQUE: The patient was scanned on a Siemens Somatom go.Top scanner.

[Series 27: multiphase % cta coronary 0.60 · axial · 0.31mm/px · z∈[-1122,-1062]mm · 3 of 3333 slices shown, 4 images]
[im 834/3333  vessel]
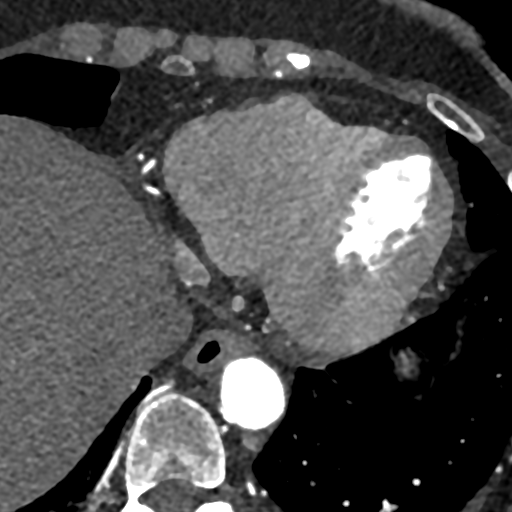
[im 834/3333  lung]
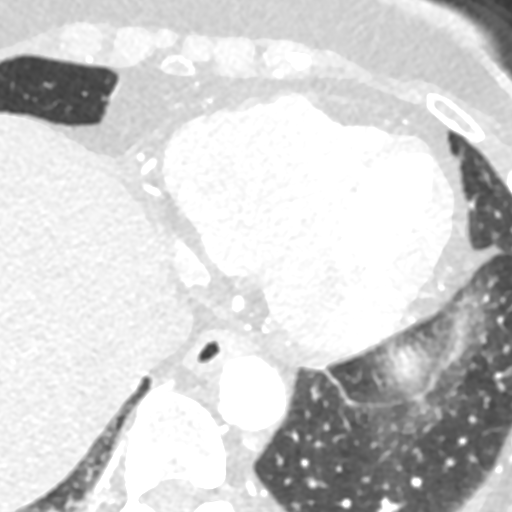
[im 1667/3333  vessel]
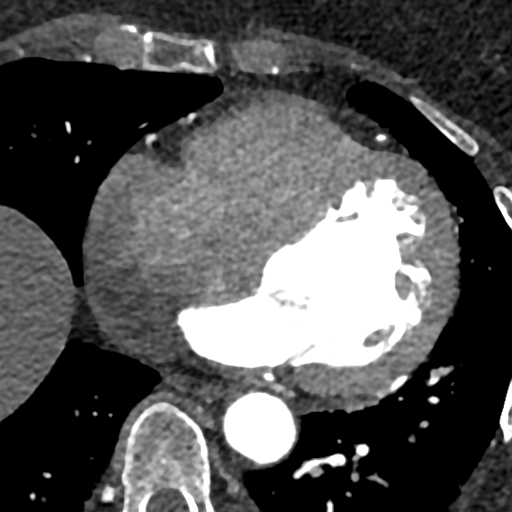
[im 2500/3333  vessel]
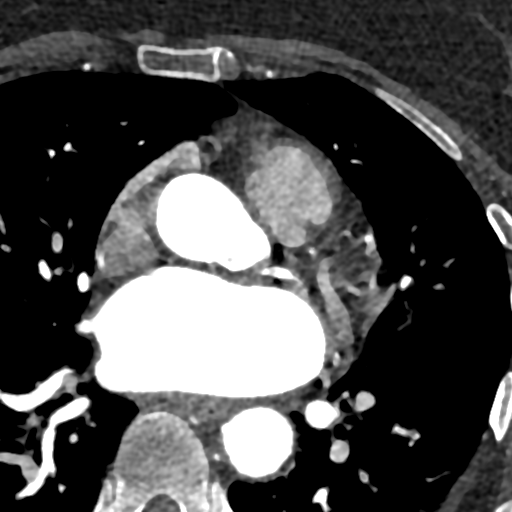

[3 of 20 positions shown; findings below may reference images not displayed]

:
A retrospective scan was triggered in the descending thoracic aorta.
Axial non-contrast 3 mm slices were carried out through the heart.
The data set was analyzed on a dedicated work station and scored
using the Agatson method. Gantry rotation speed was 330 msecs and
collimation was .6 mm. 0.8 mg of sl NTG was given. The 3D data set
was reconstructed in 5% intervals of the 60-95 % of the R-R cycle.
Diastolic phases were analyzed on a dedicated work station using
MPR, MIP and VRT modes. The patient received 75 cc of contrast.
FINDINGS: Aorta: Normal size. Moderate aortic root and ascending aorta
calcifications. No dissection.

Aortic Valve:  Trileaflet.  No calcifications.

Coronary Arteries:  Normal coronary origin.  Right dominance.

RCA is a dominant artery. The is mild calcification in the proximal
RCA causing minimal stenosis (<25%)

Left main is a large artery that gives rise to LAD and LCX arteries.
There is minimal calcification in the ostial LM causing minimal
stenosis (<25%).

LAD has mild calcified plaque proximally causing mild stenosis
(25-49%).

LCX is a non-dominant artery that gives rise to an OM branch. There
is minimal calcification causing minimal stenosis in the mid LCx.

Other findings:

Normal pulmonary vein drainage into the left atrium.

Normal left atrial appendage without a thrombus.

Normal size of the pulmonary artery.
IMPRESSION: 1. Coronary calcium score of 141. This was 68th percentile for age
and sex matched control.

2. Normal coronary origin with right dominance.

3. Mild proximal LAD disease

4. Minimal disease in the LM, proximal RCA and mid LCx.

5. CAD-RADS 2. Mild non-obstructive CAD (25-49%). Consider
non-atherosclerotic causes of chest pain. Consider preventive
therapy and risk factor modification.

EXAM:
OVER-READ INTERPRETATION  CT CHEST

The following report is an over-read performed by radiologist Dr.
over-read does not include interpretation of cardiac or coronary
anatomy or pathology. The coronary CTA interpretation by the
cardiologist is attached.
FINDINGS: No mediastinal mass or adenopathy.

Lungs are clear. No pleural effusion, airspace consolidation, or no
suspicious lung nodules.

No acute abnormality within the imaged portions

No acute or suspicious osseous.
IMPRESSION: No significant noncardiac supplemental findings identified.

*** End of Addendum ***
:
A retrospective scan was triggered in the descending thoracic aorta.
Axial non-contrast 3 mm slices were carried out through the heart.
The data set was analyzed on a dedicated work station and scored
using the Agatson method. Gantry rotation speed was 330 msecs and
collimation was .6 mm. 0.8 mg of sl NTG was given. The 3D data set
was reconstructed in 5% intervals of the 60-95 % of the R-R cycle.
Diastolic phases were analyzed on a dedicated work station using
MPR, MIP and VRT modes. The patient received 75 cc of contrast.
FINDINGS: Aorta: Normal size. Moderate aortic root and ascending aorta
calcifications. No dissection.

Aortic Valve:  Trileaflet.  No calcifications.

Coronary Arteries:  Normal coronary origin.  Right dominance.

RCA is a dominant artery. The is mild calcification in the proximal
RCA causing minimal stenosis (<25%)

Left main is a large artery that gives rise to LAD and LCX arteries.
There is minimal calcification in the ostial LM causing minimal
stenosis (<25%).

LAD has mild calcified plaque proximally causing mild stenosis
(25-49%).

LCX is a non-dominant artery that gives rise to an OM branch. There
is minimal calcification causing minimal stenosis in the mid LCx.

Other findings:

Normal pulmonary vein drainage into the left atrium.

Normal left atrial appendage without a thrombus.

Normal size of the pulmonary artery.
IMPRESSION: 1. Coronary calcium score of 141. This was 68th percentile for age
and sex matched control.

2. Normal coronary origin with right dominance.

3. Mild proximal LAD disease

4. Minimal disease in the LM, proximal RCA and mid LCx.

5. CAD-RADS 2. Mild non-obstructive CAD (25-49%). Consider
non-atherosclerotic causes of chest pain. Consider preventive
therapy and risk factor modification.

## 2022-06-18 ENCOUNTER — Telehealth: Payer: Self-pay

## 2022-06-18 NOTE — Telephone Encounter (Signed)
Pt need a refill on pantoprazole sent to [facility]

## 2022-06-18 NOTE — Telephone Encounter (Signed)
Patient states she is checking on her refill request for pantoprazole (PROTONIX) 40 MG tablet.  I let her know that we have receipt confirmed by [facility] in [place] on 06/08/2022 at 9:22am.  Patient states she is going to check with her pharmacy.

## 2022-06-19 ENCOUNTER — Other Ambulatory Visit: Payer: Self-pay

## 2022-06-19 DIAGNOSIS — R1084 Generalized abdominal pain: Secondary | ICD-10-CM

## 2022-06-19 MED ORDER — PANTOPRAZOLE SODIUM 40 MG PO TBEC: DELAYED_RELEASE_TABLET | ORAL | 11 refills | Status: DC

## 2022-06-19 NOTE — Telephone Encounter (Signed)
Pt notified refill sent

## 2022-06-27 ENCOUNTER — Other Ambulatory Visit (INDEPENDENT_AMBULATORY_CARE_PROVIDER_SITE_OTHER): Payer: Self-pay | Admitting: Nurse Practitioner

## 2022-06-27 DIAGNOSIS — I6523 Occlusion and stenosis of bilateral carotid arteries: Secondary | ICD-10-CM

## 2022-06-28 ENCOUNTER — Encounter (INDEPENDENT_AMBULATORY_CARE_PROVIDER_SITE_OTHER): Payer: Self-pay | Admitting: Vascular Surgery

## 2022-06-28 ENCOUNTER — Ambulatory Visit (INDEPENDENT_AMBULATORY_CARE_PROVIDER_SITE_OTHER): Payer: Medicare Other

## 2022-06-28 ENCOUNTER — Ambulatory Visit (INDEPENDENT_AMBULATORY_CARE_PROVIDER_SITE_OTHER): Payer: Medicare Other | Admitting: Vascular Surgery

## 2022-06-28 VITALS — BP 164/72 | HR 57 | Resp 16 | Wt 149.0 lb

## 2022-06-28 DIAGNOSIS — E785 Hyperlipidemia, unspecified: Secondary | ICD-10-CM

## 2022-06-28 DIAGNOSIS — I779 Disorder of arteries and arterioles, unspecified: Secondary | ICD-10-CM | POA: Diagnosis not present

## 2022-06-28 DIAGNOSIS — I6523 Occlusion and stenosis of bilateral carotid arteries: Secondary | ICD-10-CM | POA: Diagnosis not present

## 2022-06-28 DIAGNOSIS — I10 Essential (primary) hypertension: Secondary | ICD-10-CM | POA: Diagnosis not present

## 2022-06-28 NOTE — Assessment & Plan Note (Signed)
lipid control important in reducing the progression of atherosclerotic disease. Continue statin therapy  

## 2022-06-28 NOTE — Assessment & Plan Note (Signed)
blood pressure control important in reducing the progression of atherosclerotic disease. On appropriate oral medications.  

## 2022-06-28 NOTE — Progress Notes (Signed)
MRN : [id number]  Kristen Strickland is a 76 y.o. ([birth year]) female who presents with chief complaint of  Chief Complaint  Patient presents with   Follow-up    Ultrasound follow up  .  History of Present Illness: Patient returns in follow-up of her carotid disease.  She is having some worsening shortness of breath and sees her cardiologist later this month.  No focal neurologic symptoms. Specifically, the patient denies amaurosis fugax, speech or swallowing difficulties, or arm or leg weakness or numbness.  Carotid duplex today shows velocities in the 1 to 39% range on the right and 40 to 59% range on the left albeit the higher end of this range.  Current Outpatient Medications  Medication Sig Dispense Refill   amLODipine (NORVASC) 2.5 MG tablet Take 1 tablet (2.5 mg total) by mouth daily. 90 tablet 2   aspirin EC 81 MG tablet Take 81 mg by mouth daily.     gabapentin (NEURONTIN) 100 MG capsule TAKE (1) CAPSULE BY MOUTH ONCE DAILY. 90 capsule 0   losartan-hydrochlorothiazide (HYZAAR) 100-25 MG tablet TAKE 1 TABLET BY MOUTH ONCE DAILY. 30 tablet 0   meloxicam (MOBIC) 7.5 MG tablet Take 1 tablet by mouth daily.     Multiple Vitamins-Minerals (CENTRUM ADULTS PO) Take 1 capsule by mouth 1 day or 1 dose.     pantoprazole (PROTONIX) 40 MG tablet TAKE (1) TABLET BY MOUTH DAILY 1 HOUR BEFORE BREAKFAST. 30 tablet 11   rosuvastatin (CRESTOR) 20 MG tablet TAKE 1 TABLET BY MOUTH ONCE DAILY FOR CHOLESTEROL 90 tablet 1   oseltamivir (TAMIFLU) 75 MG capsule  (Patient not taking: Reported on 06/28/2022)     No current facility-administered medications for this visit.    Past Medical History:  Diagnosis Date   Arthritis    Carotid arterial disease ([place])    a. 11/2017 Carotid U/S: RICA 5-64%, LICA 33-29%.    Chronic fatigue    Chronic leg pain    DDD (degenerative disc disease), lumbar    Dyspnea on exertion    a. 02/2017 Echo: EF 60-65%, no rwma, mild MR. Nl RV size/fxn. Nl PASP; b. 02/2017 MV: small,  mild, fixed basal inferolateral defect - ? artifact vs scar. No ischemia. EF >65%.   Hypercholesterolemia    Hypertension    Insomnia    Palpitations    a. 02/2017 Holter: Avg HR 66 (51-115), rare PACs, four brief runs of PAT - up to 5 beats, max rate 157. Rare isolated PVCs. No sustained arrhythmias; b. 03/2018 24h Holter: Rare PACs, 3 beat run PAT (122 bpm), PVCs (2% of beats).   Radiculopathy    Lumbar region   Sinus bradycardia    Spondylolisthesis    lumbosacral region    Past Surgical History:  Procedure Laterality Date   ABDOMINAL EXPOSURE N/A 11/10/2018   Procedure: ABDOMINAL EXPOSURE;  Surgeon: Angelia Mould, MD;  Location: [facility] OR;  Service: Vascular;  Laterality: N/A;   [place] DECOMP/DISCECTOMY FUSION N/A 04/09/2013   Procedure: ANTERIOR CERVICAL DECOMPRESSION/DISCECTOMY FUSION 2 LEVELS;  Surgeon: Floyce Stakes, MD;  Location: [facility];  Service: Neurosurgery;  Laterality: N/A;  Cervical five-six Cervical six-seven  Anterior cervical decompression/diskectomy/fusion   ANTERIOR LUMBAR FUSION N/A 11/10/2018   Procedure: Anterior Lumbar Interbody Fusion-Lumbar five-Sacral one;  Surgeon: Earnie Larsson, MD;  Location: [facility];  Service: Neurosurgery;  Laterality: N/A;   BACK SURGERY     ruptured disc   RECONSTRUCTION OF  NOSE       Social History   Tobacco Use   Smoking status: Never   Smokeless tobacco: Never  Vaping Use   Vaping Use: Never used  Substance Use Topics   Alcohol use: Not Currently    Alcohol/week: 0.0 standard drinks of alcohol    Comment: rarely   Drug use: No       Family History  Problem Relation Age of Onset   Cancer Mother        ovarian/uterine   Heart disease Mother    Heart attack Mother    Stroke Mother    Colon cancer Father    Dementia Father    COPD Brother    Congestive Heart Failure Brother    Stroke Daughter    Heart attack Daughter    Suicidality Brother    Breast cancer Neg Hx       No Known Allergies   REVIEW OF SYSTEMS (Negative unless checked)  Constitutional: '[]'$ Weight loss  '[]'$ Fever  '[]'$ Chills Cardiac: '[]'$ Chest pain   '[]'$ Chest pressure   '[x]'$ Palpitations   '[]'$ Shortness of breath when laying flat   '[]'$ Shortness of breath at rest   '[x]'$ Shortness of breath with exertion. Vascular:  '[]'$ Pain in legs with walking   '[]'$ Pain in legs at rest   '[]'$ Pain in legs when laying flat   '[]'$ Claudication   '[]'$ Pain in feet when walking  '[]'$ Pain in feet at rest  '[]'$ Pain in feet when laying flat   '[]'$ History of DVT   '[]'$ Phlebitis   '[]'$ Swelling in legs   '[]'$ Varicose veins   '[]'$ Non-healing ulcers Pulmonary:   '[]'$ Uses home oxygen   '[]'$ Productive cough   '[]'$ Hemoptysis   '[]'$ Wheeze  '[]'$ COPD   '[]'$ Asthma Neurologic:  '[]'$ Dizziness  '[]'$ Blackouts   '[]'$ Seizures   '[]'$ History of stroke   '[]'$ History of TIA  '[]'$ Aphasia   '[]'$ Temporary blindness   '[]'$ Dysphagia   '[]'$ Weakness or numbness in arms   '[]'$ Weakness or numbness in legs Musculoskeletal:  '[x]'$ Arthritis   '[]'$ Joint swelling   '[]'$ Joint pain   '[x]'$ Low back pain Hematologic:  '[]'$ Easy bruising  '[]'$ Easy bleeding   '[]'$ Hypercoagulable state   '[]'$ Anemic  '[]'$ Hepatitis Gastrointestinal:  '[]'$ Blood in stool   '[]'$ Vomiting blood  '[]'$ Gastroesophageal reflux/heartburn   '[]'$ Difficulty swallowing. Genitourinary:  '[]'$ Chronic kidney disease   '[]'$ Difficult urination  '[]'$ Frequent urination  '[]'$ Burning with urination   '[]'$ Blood in urine Skin:  '[]'$ Rashes   '[]'$ Ulcers   '[]'$ Wounds Psychological:  '[]'$ History of anxiety   '[]'$  History of major depression.  Physical Examination  Vitals:   06/28/22 1002 06/28/22 1003  BP: (!) 157/66 (!) 164/72  Pulse: (!) 57   Resp: 16   Weight: 149 lb (67.6 kg)    Body mass index is 25.58 kg/m. Gen:  WD/WN, NAD Head: [place]/AT, No temporalis wasting. Ear/Nose/Throat: Hearing grossly intact, nares w/o erythema or drainage, trachea midline Eyes: Conjunctiva clear. Sclera non-icteric Neck: Supple.  Left carotid bruit  Pulmonary:  Good air movement, equal and clear to auscultation bilaterally.   Cardiac: RRR, No JVD Vascular:  Vessel Right Left  Radial Palpable Palpable       Musculoskeletal: M/S 5/5 throughout.  No deformity or atrophy.  No significant lower extremity edema. Neurologic: CN 2-12 intact. Sensation grossly intact in extremities.  Symmetrical.  Speech is fluent. Motor exam as listed above. Psychiatric: Judgment intact, Mood & affect appropriate for pt's clinical situation. Dermatologic: No rashes or ulcers noted.  No cellulitis or open wounds.     CBC Lab Results  Component Value Date  WBC 4.9 05/11/2021   HGB 11.8 05/11/2021   HCT 35.6 05/11/2021   MCV 89.2 05/11/2021   PLT 210 05/11/2021    BMET    Component Value Date/Time   NA 135 12/12/2021 1014   NA 140 07/26/2021 1001   K 3.9 12/12/2021 1014   CL 99 12/12/2021 1014   CO2 30 12/12/2021 1014   GLUCOSE 182 (H) 12/12/2021 1014   BUN 20 12/12/2021 1014   BUN 19 07/26/2021 1001   CREATININE 0.84 12/12/2021 1014   CREATININE 0.87 05/11/2021 1423   CALCIUM 9.4 12/12/2021 1014   GFRNONAA 91 08/13/2019 0935   GFRAA 105 08/13/2019 0935   CrCl cannot be calculated (Patient's most recent lab result is older than the maximum 21 days allowed.).  COAG No results found for: "INR", "PROTIME"  Radiology No results found.   Assessment/Plan Carotid artery disease (HCC) Carotid duplex today shows velocities in the 1 to 39% range on the right and 40 to 59% range on the left albeit the higher end of this range. Continue aspirin and Crestor.  No role for intervention at this level.  Not likely to be the cause of her shortness of breath.  Follow-up in 1 year with carotid duplex.    Leotis Pain, MD  06/28/2022 11:13 AM    This note was created with Dragon medical transcription system.  Any errors from dictation are purely unintentional

## 2022-06-28 NOTE — Assessment & Plan Note (Signed)
Carotid duplex today shows velocities in the 1 to 39% range on the right and 40 to 59% range on the left albeit the higher end of this range. Continue aspirin and Crestor.  No role for intervention at this level.  Not likely to be the cause of her shortness of breath.  Follow-up in 1 year with carotid duplex.

## 2022-07-03 ENCOUNTER — Telehealth: Payer: Self-pay

## 2022-07-03 MED ORDER — LOSARTAN POTASSIUM-HCTZ 100-25 MG PO TABS: 1.0000 | ORAL_TABLET | Freq: Every day | ORAL | 1 refills | Status: DC

## 2022-07-03 NOTE — Telephone Encounter (Signed)
Pt is aware.  

## 2022-07-03 NOTE — Telephone Encounter (Signed)
Patient states her pharmacy told her that her prescription for losartan-hydrochlorothiazide (HYZAAR) 100-25 MG tablet was denied.  Patient states she has one pill left and she leaves for a trip late Thursday night.  Patient asked that we please call her when the prescription has been called in to the pharmacy.  *Patient states her preferred pharmacy is [facility] in [place].

## 2022-07-03 NOTE — Telephone Encounter (Signed)
Medication has been refilled.

## 2022-07-17 ENCOUNTER — Other Ambulatory Visit
Admission: RE | Admit: 2022-07-17 | Discharge: 2022-07-17 | Disposition: A | Discharge status: Home / Self Care | Payer: Medicare Other | Source: Ambulatory Visit | Attending: Internal Medicine | Admitting: Internal Medicine

## 2022-07-17 ENCOUNTER — Ambulatory Visit: Payer: Medicare Other | Attending: Internal Medicine | Admitting: Internal Medicine

## 2022-07-17 ENCOUNTER — Encounter: Payer: Self-pay | Admitting: Internal Medicine

## 2022-07-17 VITALS — BP 128/66 | HR 59 | Ht 64.0 in | Wt 149.0 lb

## 2022-07-17 DIAGNOSIS — E785 Hyperlipidemia, unspecified: Secondary | ICD-10-CM | POA: Insufficient documentation

## 2022-07-17 DIAGNOSIS — R5383 Other fatigue: Secondary | ICD-10-CM | POA: Diagnosis not present

## 2022-07-17 DIAGNOSIS — I2510 Atherosclerotic heart disease of native coronary artery without angina pectoris: Secondary | ICD-10-CM

## 2022-07-17 DIAGNOSIS — I10 Essential (primary) hypertension: Secondary | ICD-10-CM | POA: Diagnosis not present

## 2022-07-17 DIAGNOSIS — I5032 Chronic diastolic (congestive) heart failure: Secondary | ICD-10-CM

## 2022-07-17 LAB — COMPREHENSIVE METABOLIC PANEL
ALT: 21 U/L (ref 0–44)
AST: 22 U/L (ref 15–41)
Albumin: 3.9 g/dL (ref 3.5–5.0)
Alkaline Phosphatase: 69 U/L (ref 38–126)
Anion gap: 12 (ref 5–15)
BUN: 15 mg/dL (ref 8–23)
CO2: 25 mmol/L (ref 22–32)
Calcium: 9 mg/dL (ref 8.9–10.3)
Chloride: 101 mmol/L (ref 98–111)
Creatinine, Ser: 0.78 mg/dL (ref 0.44–1.00)
GFR, Estimated: >60 mL/min (ref >60)
Glucose, Bld: 104 mg/dL — ABNORMAL HIGH (ref 70–99)
Potassium: 3.8 mmol/L (ref 3.5–5.1)
Sodium: 138 mmol/L (ref 135–145)
Total Bilirubin: 1.3 mg/dL — ABNORMAL HIGH (ref 0.3–1.2)
Total Protein: 7.1 g/dL (ref 6.5–8.1)

## 2022-07-17 LAB — CBC
HCT: 36.3 % (ref 36.0–46.0)
Hemoglobin: 11.8 g/dL — ABNORMAL LOW (ref 12.0–15.0)
MCH: 29.6 pg (ref 26.0–34.0)
MCHC: 32.5 g/dL (ref 30.0–36.0)
MCV: 91.2 fL (ref 80.0–100.0)
Platelets: 202 10*3/uL (ref 150–400)
RBC: 3.98 MIL/uL (ref 3.87–5.11)
RDW: 12.7 % (ref 11.5–15.5)
WBC: 4.7 10*3/uL (ref 4.0–10.5)
nRBC: 0 % (ref 0.0–0.2)

## 2022-07-17 LAB — LIPID PANEL
Cholesterol: 144 mg/dL (ref 0–200)
HDL: 45 mg/dL (ref >40)
LDL Cholesterol: 68 mg/dL (ref 0–99)
Total CHOL/HDL Ratio: 3.2 RATIO
Triglycerides: 154 mg/dL — ABNORMAL HIGH (ref <150)
VLDL: 31 mg/dL (ref 0–40)

## 2022-07-17 NOTE — Progress Notes (Signed)
Follow-up Outpatient Visit Date: 07/17/2022  Primary Care Provider: Einar Pheasant, [place] [address] [place] [id number]  Chief Complaint: Fatigue and shortness of breath walking uphill  HPI:  Kristen Strickland is a 76 y.o. female with history of chronic HFpEF, hypertension, hyperlipidemia, carotid artery stenosis (followed by vascular surgery), and degenerative disc disease, who presents for follow-up of shortness of breath, leg swelling, and hyperlipidemia.  I last saw her in March, at which time Kristen Strickland felt the same as at prior visits, continuing to note exertional dyspnea when walking up an incline.  She also had mild leg swelling, left greater than right.  She complained of frequent orthostatic lightheadedness and generally poor balance.  She had not fallen.  We agreed to decrease amlodipine to see if this would help her leg swelling.  We did not pursue further testing at that time.  Today, Kristen Strickland reports that she has been having cold symptoms for the last 3 to 4 days.  She tested herself for COVID-19 and states it was negative.  Other than this, she has been feeling about the same as at what our last visit.  She still gets tired and out of breath when walking up an incline.  On level ground and even on stairs, she does not have any limitations.  She does not exercise regularly but is "busy all the time" doing work around the house.  She also helps care for her great-grandchildren.  She continues to have some leg edema, left greater than right, which did not improve much with de-escalation of amlodipine.  She has used compression stockings in the past with good response but has not used them recently.  She denies chest pain, palpitations, and lightheadedness.  --------------------------------------------------------------------------------------------------  Past Medical History:  Diagnosis Date   Arthritis    Carotid arterial disease ([employer])    a. 11/2017 Carotid  U/S: RICA 3-87%, LICA 56-43%.    Chronic fatigue    Chronic leg pain    DDD (degenerative disc disease), lumbar    Dyspnea on exertion    a. 02/2017 Echo: EF 60-65%, no rwma, mild MR. Nl RV size/fxn. Nl PASP; b. 02/2017 MV: small, mild, fixed basal inferolateral defect - ? artifact vs scar. No ischemia. EF >65%.   Hypercholesterolemia    Hypertension    Insomnia    Palpitations    a. 02/2017 Holter: Avg HR 66 (51-115), rare PACs, four brief runs of PAT - up to 5 beats, max rate 157. Rare isolated PVCs. No sustained arrhythmias; b. 03/2018 24h Holter: Rare PACs, 3 beat run PAT (122 bpm), PVCs (2% of beats).   Radiculopathy    Lumbar region   Sinus bradycardia    Spondylolisthesis    lumbosacral region   Past Surgical History:  Procedure Laterality Date   ABDOMINAL EXPOSURE N/A 11/10/2018   Procedure: ABDOMINAL EXPOSURE;  Surgeon: Angelia Mould, MD;  Location: [facility] OR;  Service: Vascular;  Laterality: N/A;   [place] DECOMP/DISCECTOMY FUSION N/A 04/09/2013   Procedure: ANTERIOR CERVICAL DECOMPRESSION/DISCECTOMY FUSION 2 LEVELS;  Surgeon: Floyce Stakes, MD;  Location: [facility];  Service: Neurosurgery;  Laterality: N/A;  Cervical five-six Cervical six-seven  Anterior cervical decompression/diskectomy/fusion   ANTERIOR LUMBAR FUSION N/A 11/10/2018   Procedure: Anterior Lumbar Interbody Fusion-Lumbar five-Sacral one;  Surgeon: Earnie Larsson, MD;  Location: [facility];  Service: Neurosurgery;  Laterality: N/A;   BACK SURGERY     ruptured disc  RECONSTRUCTION OF NOSE      Current Meds  Medication Sig   amLODipine (NORVASC) 2.5 MG tablet Take 1 tablet (2.5 mg total) by mouth daily.   aspirin EC 81 MG tablet Take 81 mg by mouth daily.   gabapentin (NEURONTIN) 100 MG capsule TAKE (1) CAPSULE BY MOUTH ONCE DAILY.   losartan-hydrochlorothiazide (HYZAAR) 100-25 MG tablet Take 1 tablet by mouth daily.   meloxicam (MOBIC) 7.5 MG tablet Take 1 tablet by  mouth daily as needed.   Multiple Vitamins-Minerals (CENTRUM ADULTS PO) Take 1 capsule by mouth 1 day or 1 dose.   pantoprazole (PROTONIX) 40 MG tablet TAKE (1) TABLET BY MOUTH DAILY 1 HOUR BEFORE BREAKFAST.   rosuvastatin (CRESTOR) 20 MG tablet TAKE 1 TABLET BY MOUTH ONCE DAILY FOR CHOLESTEROL   [DISCONTINUED] gabapentin (NEURONTIN) 100 MG capsule Take 100 mg by mouth at bedtime.    Allergies: Patient has no known allergies.  Social History   Tobacco Use   Smoking status: Never   Smokeless tobacco: Never  Vaping Use   Vaping Use: Never used  Substance Use Topics   Alcohol use: Not Currently    Alcohol/week: 0.0 standard drinks of alcohol    Comment: rarely   Drug use: No    Family History  Problem Relation Age of Onset   Cancer Mother        ovarian/uterine   Heart disease Mother    Heart attack Mother    Stroke Mother    Colon cancer Father    Dementia Father    COPD Brother    Congestive Heart Failure Brother    Stroke Daughter    Heart attack Daughter    Suicidality Brother    Breast cancer Neg Hx     Review of Systems: A 12-system review of systems was performed and was negative except as noted in the HPI.  --------------------------------------------------------------------------------------------------  Physical Exam: BP 128/66 (BP Location: Left Arm, Patient Position: Sitting, Cuff Size: Normal)   Pulse (!) 59   Ht '5\' 4"'$  (1.626 m)   Wt 149 lb (67.6 kg)   SpO2 97%   BMI 25.58 kg/m   General:  NAD. Neck: No JVD or HJR. Lungs: Clear to auscultation bilaterally without wheezes or crackles. Heart: Regular rate and rhythm without murmurs, rubs, or gallops. Abdomen: Soft, nontender, nondistended. Extremities: Trace left lower extremity edema.  Bilateral varicose veins noted.  EKG: Sinus bradycardia.  No significant abnormality or change from prior tracing on 01/09/2022.  Lab Results  Component Value Date   WBC 4.9 05/11/2021   HGB 11.8 05/11/2021    HCT 35.6 05/11/2021   MCV 89.2 05/11/2021   PLT 210 05/11/2021    Lab Results  Component Value Date   NA 135 12/12/2021   K 3.9 12/12/2021   CL 99 12/12/2021   CO2 30 12/12/2021   BUN 20 12/12/2021   CREATININE 0.84 12/12/2021   GLUCOSE 182 (H) 12/12/2021   ALT 18 11/08/2021    Lab Results  Component Value Date   CHOL 169 06/28/2021   HDL 56.60 06/28/2021   LDLCALC 87 06/28/2021   LDLDIRECT 151.7 08/16/2013   TRIG 128.0 06/28/2021   CHOLHDL 3 06/28/2021    --------------------------------------------------------------------------------------------------  ASSESSMENT AND PLAN: Chronic HFpEF and fatigue: Ms. Knoch has stable exertional dyspnea and sporadic fatigue that is most pronounced when she walks uphill.  Chronic left lower extremity edema appears stable and is most likely driven by chronic venous stasis.  We discussed additional testing, including  cardiopulmonary stress test to objectively assess her functional capacity, as well as medication changes such as addition of an SGLT2 inhibitor.  Ms. Sofia wishes to defer further testing and medication changes at this time.  I have encouraged her to try using a compression stocking to help with her left leg edema.  I will check a CBC and CMP today.  Coronary artery disease: No angina reported.  I do not think that the mild-moderate CAD identified on coronary CTA last year explains her chronic dyspnea walking up stairs.  Continue low-dose amlodipine for antianginal therapy, including possible microvascular dysfunction.  Resting bradycardia precludes addition of a beta-blocker.  Hypertension: Blood pressure well controlled today.  Continue amlodipine and losartan-HCTZ.  Hyperlipidemia: LDL just above goal on last check in 06/2021.  We will plan to repeat a lipid panel and CMP today.  Continue rosuvastatin for target LDL less than 70.  Follow-up: Return to clinic in 1 year.  Nelva Bush, MD 07/17/2022 9:54 AM

## 2022-07-17 NOTE — Patient Instructions (Addendum)
Medication Instructions:  - Your physician recommends that you continue on your current medications as directed. Please refer to the Current Medication list given to you today.  *If you need a refill on your cardiac medications before your next appointment, please call your pharmacy*   Lab Work: - Your physician recommends that you have lab work today:  CMP/ [place] Entrance at [facility] 1st desk on the right to check in (REGISTRATION)  Lab hours: Monday- Friday (7:30 am- 5:30 pm)  If you have labs (blood work) drawn today and your tests are completely normal, you will receive your results only by: MyChart Message (if you have MyChart) OR A paper copy in the mail If you have any lab test that is abnormal or we need to change your treatment, we will call you to review the results.   Testing/Procedures: - none ordered   Follow-Up: At [facility], you and your health needs are our priority.  As part of our continuing mission to provide you with exceptional heart care, we have created designated Provider Care Teams.  These Care Teams include your primary Cardiologist (physician) and Advanced Practice Providers (APPs -  Physician Assistants and Nurse Practitioners) who all work together to provide you with the care you need, when you need it.  We recommend signing up for the patient portal called "MyChart".  Sign up information is provided on this After Visit Summary.  MyChart is used to connect with patients for Virtual Visits (Telemedicine).  Patients are able to view lab/test results, encounter notes, upcoming appointments, etc.  Non-urgent messages can be sent to your provider as well.   To learn more about what you can do with MyChart, go to [email or web address].    Your next appointment:   1 year(s)  The format for your next appointment:   In Person  Provider:   You may see Nelva Bush, MD or one of the following Advanced Practice Providers on your  designated Care Team:   Murray Hodgkins, NP Christell Faith, PA-C Cadence Kathlen Mody, PA-C Gerrie Nordmann, NP    Other Instructions N/a  Important Information About Sugar

## 2022-07-19 ENCOUNTER — Telehealth: Payer: Self-pay | Admitting: Internal Medicine

## 2022-07-19 NOTE — Telephone Encounter (Signed)
Lm on vm to call and schedule a follow up appointment with Dr Nicki Reaper.

## 2022-08-08 ENCOUNTER — Encounter: Payer: Self-pay | Admitting: Internal Medicine

## 2022-08-08 ENCOUNTER — Ambulatory Visit (INDEPENDENT_AMBULATORY_CARE_PROVIDER_SITE_OTHER): Payer: Medicare Other | Admitting: Internal Medicine

## 2022-08-08 ENCOUNTER — Ambulatory Visit (INDEPENDENT_AMBULATORY_CARE_PROVIDER_SITE_OTHER): Payer: Medicare Other

## 2022-08-08 VITALS — BP 126/64 | HR 56 | Temp 97.7°F | Ht 64.0 in | Wt 148.8 lb

## 2022-08-08 DIAGNOSIS — I700 Atherosclerosis of aorta: Secondary | ICD-10-CM

## 2022-08-08 DIAGNOSIS — I779 Disorder of arteries and arterioles, unspecified: Secondary | ICD-10-CM | POA: Diagnosis not present

## 2022-08-08 DIAGNOSIS — R0609 Other forms of dyspnea: Secondary | ICD-10-CM

## 2022-08-08 DIAGNOSIS — E785 Hyperlipidemia, unspecified: Secondary | ICD-10-CM | POA: Diagnosis not present

## 2022-08-08 DIAGNOSIS — I10 Essential (primary) hypertension: Secondary | ICD-10-CM | POA: Diagnosis not present

## 2022-08-08 DIAGNOSIS — I6523 Occlusion and stenosis of bilateral carotid arteries: Secondary | ICD-10-CM

## 2022-08-08 DIAGNOSIS — F439 Reaction to severe stress, unspecified: Secondary | ICD-10-CM

## 2022-08-08 DIAGNOSIS — I5032 Chronic diastolic (congestive) heart failure: Secondary | ICD-10-CM | POA: Diagnosis not present

## 2022-08-08 DIAGNOSIS — D649 Anemia, unspecified: Secondary | ICD-10-CM

## 2022-08-08 DIAGNOSIS — M2550 Pain in unspecified joint: Secondary | ICD-10-CM

## 2022-08-08 DIAGNOSIS — R739 Hyperglycemia, unspecified: Secondary | ICD-10-CM | POA: Diagnosis not present

## 2022-08-08 LAB — HEPATIC FUNCTION PANEL
ALT: 19 U/L (ref 0–35)
AST: 19 U/L (ref 0–37)
Albumin: 4.5 g/dL (ref 3.5–5.2)
Alkaline Phosphatase: 64 U/L (ref 39–117)
Bilirubin, Direct: 0.2 mg/dL (ref 0.0–0.3)
Total Bilirubin: 1.1 mg/dL (ref 0.2–1.2)
Total Protein: 6.8 g/dL (ref 6.0–8.3)

## 2022-08-08 LAB — CBC WITH DIFFERENTIAL/PLATELET
Basophils Absolute: 0.1 10*3/uL (ref 0.0–0.1)
Basophils Relative: 2 % (ref 0.0–3.0)
Eosinophils Absolute: 0.1 10*3/uL (ref 0.0–0.7)
Eosinophils Relative: 3.5 % (ref 0.0–5.0)
HCT: 37.2 % (ref 36.0–46.0)
Hemoglobin: 12.2 g/dL (ref 12.0–15.0)
Lymphocytes Relative: 28.1 % (ref 12.0–46.0)
Lymphs Abs: 1.1 10*3/uL (ref 0.7–4.0)
MCHC: 32.8 g/dL (ref 30.0–36.0)
MCV: 90.4 fl (ref 78.0–100.0)
Monocytes Absolute: 0.4 10*3/uL (ref 0.1–1.0)
Monocytes Relative: 9.2 % (ref 3.0–12.0)
Neutro Abs: 2.3 10*3/uL (ref 1.4–7.7)
Neutrophils Relative %: 57.2 % (ref 43.0–77.0)
Platelets: 212 10*3/uL (ref 150.0–400.0)
RBC: 4.12 Mil/uL (ref 3.87–5.11)
RDW: 13.8 % (ref 11.5–15.5)
WBC: 4.1 10*3/uL (ref 4.0–10.5)

## 2022-08-08 LAB — IBC + FERRITIN
Ferritin: 65.2 ng/mL (ref 10.0–291.0)
Iron: 74 ug/dL (ref 42–145)
Saturation Ratios: 18.8 % — ABNORMAL LOW (ref 20.0–50.0)
TIBC: 393.4 ug/dL (ref 250.0–450.0)
Transferrin: 281 mg/dL (ref 212.0–360.0)

## 2022-08-08 LAB — HEMOGLOBIN A1C: Hgb A1c MFr Bld: 6.1 % (ref 4.6–6.5)

## 2022-08-08 LAB — BASIC METABOLIC PANEL
BUN: 17 mg/dL (ref 6–23)
CO2: 30 mEq/L (ref 19–32)
Calcium: 9.7 mg/dL (ref 8.4–10.5)
Chloride: 103 mEq/L (ref 96–112)
Creatinine, Ser: 0.75 mg/dL (ref 0.40–1.20)
GFR: 77.95 mL/min (ref >60.00)
Glucose, Bld: 102 mg/dL — ABNORMAL HIGH (ref 70–99)
Potassium: 3.8 mEq/L (ref 3.5–5.1)
Sodium: 139 mEq/L (ref 135–145)

## 2022-08-08 LAB — TSH: TSH: 3.79 u[IU]/mL (ref 0.35–5.50)

## 2022-08-08 LAB — VITAMIN B12: Vitamin B-12: 348 pg/mL (ref 211–911)

## 2022-08-08 NOTE — Progress Notes (Signed)
Patient ID: Kristen Strickland, female   DOB: 12/07/1945, 76 y.o.   MRN: [id number]   Subjective:    Patient ID: Kristen Strickland, female    DOB: Jun 01, 1946, 76 y.o.   MRN: [id number]   Patient here for  Chief Complaint  Patient presents with   Follow-up    F/u and pt would like to discuss arthritis and alternatives to meloxicam because was told it could affect her kidneys    .   HPI Here to follow up regarding hypertension, HFpEF and hypercholesterolemia.  Last saw cardiology 07/17/22 - f/u  - reported out of breath when walking up and incline and some lower extremity swelling.  Dr End discussed further testing (including cardiopulmonary stress test, as well as medication changes such as addition of an SBLT2 inhibitor.  Elected to defer testing or medication changes.  Instructed to wear compression hose.  Overall feels her breathing is stable.  Still reports the sob with exertion as outlined, but stable. Does report increased joint pain - hands/knees.  Stays active.  No nausea or vomiting.  No abdominal pain.     Past Medical History:  Diagnosis Date   Arthritis    Carotid arterial disease ([place])    a. 11/2017 Carotid U/S: RICA 1-30%, LICA 86-57%.    Chronic fatigue    Chronic leg pain    DDD (degenerative disc disease), lumbar    Dyspnea on exertion    a. 02/2017 Echo: EF 60-65%, no rwma, mild MR. Nl RV size/fxn. Nl PASP; b. 02/2017 MV: small, mild, fixed basal inferolateral defect - ? artifact vs scar. No ischemia. EF >65%.   Hypercholesterolemia    Hypertension    Insomnia    Palpitations    a. 02/2017 Holter: Avg HR 66 (51-115), rare PACs, four brief runs of PAT - up to 5 beats, max rate 157. Rare isolated PVCs. No sustained arrhythmias; b. 03/2018 24h Holter: Rare PACs, 3 beat run PAT (122 bpm), PVCs (2% of beats).   Radiculopathy    Lumbar region   Sinus bradycardia    Spondylolisthesis    lumbosacral region   Past Surgical History:  Procedure Laterality Date   ABDOMINAL EXPOSURE N/A  11/10/2018   Procedure: ABDOMINAL EXPOSURE;  Surgeon: Angelia Mould, MD;  [facility];  Service: Vascular;  Laterality: N/A;   [place] DECOMP/DISCECTOMY FUSION N/A 04/09/2013   Procedure: ANTERIOR CERVICAL DECOMPRESSION/DISCECTOMY FUSION 2 LEVELS;  Surgeon: Floyce Stakes, MD;  Location: [facility];  Service: Neurosurgery;  Laterality: N/A;  Cervical five-six Cervical six-seven  Anterior cervical decompression/diskectomy/fusion   ANTERIOR LUMBAR FUSION N/A 11/10/2018   Procedure: Anterior Lumbar Interbody Fusion-Lumbar five-Sacral one;  Surgeon: Earnie Larsson, MD;  Location: [place];  Service: Neurosurgery;  Laterality: N/A;   BACK SURGERY     ruptured disc   RECONSTRUCTION OF NOSE     Family History  Problem Relation Age of Onset   Cancer Mother        ovarian/uterine   Heart disease Mother    Heart attack Mother    Stroke Mother    Colon cancer Father    Dementia Father    COPD Brother    Congestive Heart Failure Brother    Stroke Daughter    Heart attack Daughter    Suicidality Brother    Breast cancer Neg Hx    Social History   Socioeconomic History   Marital status: Married    Spouse name:  Not on file   Number of children: 2   Years of education: Not on file   Highest education level: Not on file  Occupational History   Not on file  Tobacco Use   Smoking status: Never   Smokeless tobacco: Never  Vaping Use   Vaping Use: Never used  Substance and Sexual Activity   Alcohol use: Not Currently    Alcohol/week: 0.0 standard drinks of alcohol    Comment: rarely   Drug use: No   Sexual activity: Yes  Other Topics Concern   Not on file  Social History Narrative   ** Merged History Encounter **       Social Determinants of Health   Financial Resource Strain: Low Risk  (05/10/2022)   Overall Financial Resource Strain (CARDIA)    Difficulty of Paying Living Expenses: Not hard at all  Food Insecurity: No Food  Insecurity (05/10/2022)   Hunger Vital Sign    Worried About Running Out of Food in the Last Year: Never true    Ran Out of Food in the Last Year: Never true  Transportation Needs: No Transportation Needs (05/10/2022)   PRAPARE - Hydrologist (Medical): No    Lack of Transportation (Non-Medical): No  Physical Activity: Sufficiently Active (05/10/2022)   Exercise Vital Sign    Days of Exercise per Week: 5 days    Minutes of Exercise per Session: 30 min  Stress: No Stress Concern Present (05/10/2022)   [place]    Feeling of Stress : Not at all  Social Connections: Unknown (05/10/2022)   Social Connection and Isolation Panel [NHANES]    Frequency of Communication with Friends and Family: More than three times a week    Frequency of Social Gatherings with Friends and Family: More than three times a week    Attends Religious Services: Not on Advertising copywriter or Organizations: Not on file    Attends Archivist Meetings: Not on file    Marital Status: Not on file     Review of Systems  Constitutional:  Negative for appetite change and unexpected weight change.  HENT:  Negative for congestion and sinus pressure.   Respiratory:  Negative for cough.        Overall breathing stable as outlined.   Cardiovascular:  Negative for chest pain and palpitations.       No increased swelling.   Gastrointestinal:  Negative for abdominal pain, diarrhea, nausea and vomiting.  Genitourinary:  Negative for difficulty urinating and dysuria.  Musculoskeletal:  Negative for myalgias.       Joint pain as outlined.   Skin:  Negative for color change and rash.  Neurological:  Negative for dizziness and headaches.  Psychiatric/Behavioral:  Negative for agitation and dysphoric mood.        Objective:     BP 126/64 (BP Location: Left Arm, Patient Position: Sitting, Cuff Size: Normal)    Pulse (!) 56   Temp 97.7 F (36.5 C) (Oral)   Ht '5\' 4"'  (1.626 m)   Wt 148 lb 12.8 oz (67.5 kg)   SpO2 97%   BMI 25.54 kg/m  Wt Readings from Last 3 Encounters:  08/08/22 148 lb 12.8 oz (67.5 kg)  07/17/22 149 lb (67.6 kg)  06/28/22 149 lb (67.6 kg)    Physical Exam Vitals reviewed.  Constitutional:      General: She is not in  acute distress.    Appearance: Normal appearance.  HENT:     Head: Normocephalic and atraumatic.     Right Ear: External ear normal.     Left Ear: External ear normal.  Eyes:     General: No scleral icterus.       Right eye: No discharge.        Left eye: No discharge.     Conjunctiva/sclera: Conjunctivae normal.  Neck:     Thyroid: No thyromegaly.  Cardiovascular:     Rate and Rhythm: Normal rate and regular rhythm.  Pulmonary:     Effort: No respiratory distress.     Breath sounds: Normal breath sounds. No wheezing.  Abdominal:     General: Bowel sounds are normal.     Palpations: Abdomen is soft.     Tenderness: There is no abdominal tenderness.  Musculoskeletal:        General: No swelling or tenderness.     Cervical back: Neck supple. No tenderness.  Lymphadenopathy:     Cervical: No cervical adenopathy.  Skin:    Findings: No erythema or rash.  Neurological:     Mental Status: She is alert.  Psychiatric:        Mood and Affect: Mood normal.        Behavior: Behavior normal.      Outpatient Encounter Medications as of 08/08/2022  Medication Sig   amLODipine (NORVASC) 2.5 MG tablet Take 1 tablet (2.5 mg total) by mouth daily.   aspirin EC 81 MG tablet Take 81 mg by mouth daily.   gabapentin (NEURONTIN) 100 MG capsule TAKE (1) CAPSULE BY MOUTH ONCE DAILY.   losartan-hydrochlorothiazide (HYZAAR) 100-25 MG tablet Take 1 tablet by mouth daily.   meloxicam (MOBIC) 7.5 MG tablet Take 1 tablet by mouth daily as needed.   Multiple Vitamins-Minerals (CENTRUM ADULTS PO) Take 1 capsule by mouth 1 day or 1 dose.   pantoprazole (PROTONIX) 40  MG tablet TAKE (1) TABLET BY MOUTH DAILY 1 HOUR BEFORE BREAKFAST.   rosuvastatin (CRESTOR) 20 MG tablet TAKE 1 TABLET BY MOUTH ONCE DAILY FOR CHOLESTEROL   No facility-administered encounter medications on file as of 08/08/2022.     Lab Results  Component Value Date   WBC 4.1 08/08/2022   HGB 12.2 08/08/2022   HCT 37.2 08/08/2022   PLT 212.0 08/08/2022   GLUCOSE 102 (H) 08/08/2022   CHOL 144 07/17/2022   TRIG 154 (H) 07/17/2022   HDL 45 07/17/2022   LDLDIRECT 151.7 08/16/2013   LDLCALC 68 07/17/2022   ALT 19 08/08/2022   AST 19 08/08/2022   NA 139 08/08/2022   K 3.8 08/08/2022   CL 103 08/08/2022   CREATININE 0.75 08/08/2022   BUN 17 08/08/2022   CO2 30 08/08/2022   TSH 3.79 08/08/2022   HGBA1C 6.1 08/08/2022       Assessment & Plan:   Problem List Items Addressed This Visit     Anemia   Relevant Orders   Vitamin B12 (Completed)   IBC + Ferritin (Completed)   Aortic atherosclerosis ([place])    Continue crestor.       Carotid artery disease ([place])    Dr Lucky Cowboy (06/28/22) -Carotid duplex today shows velocities in the 1 to 39% range on the right and 40 to 59% range on the left albeit the higher end of this range. Continue aspirin and Crestor. No role for intervention at this level.  Follow-up in 1 year with carotid duplex.  Chronic heart failure with preserved ejection fraction (HFpEF) ([place])    Continues on losartan/hctz.  No evidence of volume overload on exam.  Follow.       DOE (dyspnea on exertion)    Has seen cardiology as outlined.  Elected to monitor.  Check cxr.  Reports stable.       Relevant Orders   DG Chest 2 View (Completed)   Hyperglycemia    Low carb diet and exercise.  Follow met b and a1c.       Hyperlipidemia LDL goal <70    On crestor.  Low cholesterol diet and exercise.  Follow lipid panel and liver function tests.        Hypertension - Primary    Blood pressure as outlined.  Continues on losartan/hctz and amlodipine.  Follow pressures.   Follow metabolic panel.       Joint pain    Discussed trying to limit amount of meloxicam use.  Tylenol.  Follow.       Stress    Overall appears to be handling things well. Follow.         Einar Pheasant, MD

## 2022-08-09 ENCOUNTER — Telehealth: Payer: Self-pay

## 2022-08-09 NOTE — Telephone Encounter (Signed)
LMTCB for lab results.  

## 2022-08-12 ENCOUNTER — Telehealth: Payer: Self-pay

## 2022-08-12 NOTE — Telephone Encounter (Signed)
Patient returned office phone call for results 

## 2022-08-12 NOTE — Telephone Encounter (Signed)
LMTCB for lab & xray results.

## 2022-08-13 ENCOUNTER — Telehealth: Payer: Self-pay

## 2022-08-13 NOTE — Telephone Encounter (Signed)
Pt returning call

## 2022-08-13 NOTE — Telephone Encounter (Signed)
LMOM to CB in regards to labs  Called both home phone and cellphone was only able to leave a msg on the homephone as cellphone vm not set up

## 2022-08-15 NOTE — Telephone Encounter (Signed)
See result note, pt was notified in regards to lab/x-ray results.

## 2022-08-18 ENCOUNTER — Encounter: Payer: Self-pay | Admitting: Internal Medicine

## 2022-08-18 DIAGNOSIS — M2550 Pain in unspecified joint: Secondary | ICD-10-CM | POA: Insufficient documentation

## 2022-08-18 NOTE — Assessment & Plan Note (Signed)
Blood pressure as outlined.  Continues on losartan/hctz and amlodipine.  Follow pressures.  Follow metabolic panel.

## 2022-08-18 NOTE — Assessment & Plan Note (Signed)
Discussed trying to limit amount of meloxicam use.  Tylenol.  Follow.

## 2022-08-18 NOTE — Assessment & Plan Note (Signed)
Continues on losartan/hctz.  No evidence of volume overload on exam.  Follow.

## 2022-08-18 NOTE — Assessment & Plan Note (Signed)
Continue crestor 

## 2022-08-18 NOTE — Assessment & Plan Note (Signed)
Has seen cardiology as outlined.  Elected to monitor.  Check cxr.  Reports stable.

## 2022-08-18 NOTE — Assessment & Plan Note (Signed)
Low carb diet and exercise.  Follow met b and a1c.  

## 2022-08-18 NOTE — Assessment & Plan Note (Signed)
Dr Lucky Cowboy (06/28/22) -Carotid duplex today shows velocities in the 1 to 39% range on the right and 40 to 59% range on the left albeit the higher end of this range. Continue aspirin and Crestor. No role for intervention at this level.  Follow-up in 1 year with carotid duplex.

## 2022-08-18 NOTE — Assessment & Plan Note (Signed)
On crestor.  Low cholesterol diet and exercise.  Follow lipid panel and liver function tests.   

## 2022-08-18 NOTE — Assessment & Plan Note (Signed)
Overall appears to be handling things well.  Follow.  ?

## 2022-08-20 ENCOUNTER — Other Ambulatory Visit: Payer: Self-pay | Admitting: Internal Medicine

## 2022-10-15 ENCOUNTER — Other Ambulatory Visit: Payer: Self-pay

## 2022-10-15 ENCOUNTER — Telehealth: Payer: Self-pay | Admitting: Internal Medicine

## 2022-10-15 MED ORDER — GABAPENTIN 100 MG PO CAPS: ORAL_CAPSULE | ORAL | 0 refills | Status: DC

## 2022-10-15 NOTE — Telephone Encounter (Signed)
Pt need a refill on gabapentin sent to [facility]

## 2022-10-15 NOTE — Telephone Encounter (Signed)
sent 

## 2022-10-16 ENCOUNTER — Ambulatory Visit
Admission: RE | Admit: 2022-10-16 | Discharge: 2022-10-16 | Disposition: A | Discharge status: Home / Self Care | Payer: Medicare Other | Source: Ambulatory Visit | Attending: Physician Assistant | Admitting: Physician Assistant

## 2022-10-16 VITALS — BP 149/68 | HR 62 | Temp 98.2°F | Resp 16

## 2022-10-16 DIAGNOSIS — M79675 Pain in left toe(s): Secondary | ICD-10-CM

## 2022-10-16 DIAGNOSIS — L03032 Cellulitis of left toe: Secondary | ICD-10-CM | POA: Diagnosis not present

## 2022-10-16 MED ORDER — KETOROLAC TROMETHAMINE 60 MG/2ML IM SOLN
30.0000 mg | Freq: Once | INTRAMUSCULAR | Status: AC
  Administered 2022-10-16: 30 mg via INTRAMUSCULAR

## 2022-10-16 MED ORDER — CEPHALEXIN 500 MG PO CAPS: 500.0000 mg | ORAL_CAPSULE | Freq: Four times a day (QID) | ORAL | 0 refills | Status: AC

## 2022-10-16 NOTE — Discharge Instructions (Addendum)
-  Cuticle infection.  I sent antibiotics to pharmacy.  Continue with warm Epsom salt soaks.  Tylenol for pain.  This should be looking better in the next couple days but keep your appointment with the podiatrist.  We have given you an injection of Toradol in clinic to help with pain and inflammation.

## 2022-10-16 NOTE — ED Triage Notes (Signed)
Pt reports left great toe toe is infected.

## 2022-10-16 NOTE — ED Provider Notes (Signed)
[facility]    CSN: [id number] Arrival date & time: 10/16/22  1054      History   Chief Complaint Chief Complaint  Patient presents with   Toe Pain    Possible infection or inflammation of toe. Severe pain multiple days. - Entered by patient    HPI Kristen Strickland is a 76 y.o. female presenting for pain, swelling and redness of the cuticle of left great toe for the past several days.  She has been performing warm Epsom salt soaks and says it has improved a little bit over the past 24 hours.  She denies any drainage.  She reports that she had her toenails done about 2 weeks ago.  She reports that she occasionally has pain in this toe already.  She believes she may have arthritis in this toe.  Has been taking Tylenol for the pain and says has not really helped.  She does have chronic low back pain with left-sided sciatica/radiculopathy/neuropathy of the left side.  She has not had any fevers.  No other complaints.  Has an appointment with a podiatrist on 10/29/2022.  HPI  Past Medical History:  Diagnosis Date   Arthritis    Carotid arterial disease ([place])    a. 11/2017 Carotid U/S: RICA 2-11%, LICA 94-17%.    Chronic fatigue    Chronic leg pain    DDD (degenerative disc disease), lumbar    Dyspnea on exertion    a. 02/2017 Echo: EF 60-65%, no rwma, mild MR. Nl RV size/fxn. Nl PASP; b. 02/2017 MV: small, mild, fixed basal inferolateral defect - ? artifact vs scar. No ischemia. EF >65%.   Hypercholesterolemia    Hypertension    Insomnia    Palpitations    a. 02/2017 Holter: Avg HR 66 (51-115), rare PACs, four brief runs of PAT - up to 5 beats, max rate 157. Rare isolated PVCs. No sustained arrhythmias; b. 03/2018 24h Holter: Rare PACs, 3 beat run PAT (122 bpm), PVCs (2% of beats).   Radiculopathy    Lumbar region   Sinus bradycardia    Spondylolisthesis    lumbosacral region    Patient Active Problem List   Diagnosis Date Noted   Joint pain 08/18/2022   Anemia  08/08/2022   Chronic heart failure with preserved ejection fraction (HFpEF) ([place]) 01/09/2022   Coronary artery disease involving native coronary artery of native heart without angina pectoris 01/09/2022   Mitral valve insufficiency 07/26/2021   Precordial pain 07/26/2021   Calculus of gallbladder without cholecystitis without obstruction 07/26/2021   Aortic atherosclerosis ([place]) 05/18/2021   Body mass index (BMI) 22.0-22.9, adult 08/31/2020   Arthrodesis status 08/10/2020   Abdominal pain 05/24/2020   Spondylolisthesis, lumbosacral region 02/02/2020   History of lumbar fusion 11/30/2019   Bilateral leg pain 08/13/2019   Sleeping difficulties 06/29/2019   Left foot pain 05/30/2019   Hyperglycemia 05/30/2019   PAC (premature atrial contraction) 01/27/2019   PVC (premature ventricular contraction) 01/27/2019   Left leg swelling 01/27/2019   Bilateral carotid artery stenosis 01/27/2019   Degenerative spondylolisthesis 11/10/2018   Decreased sense of taste 10/12/2018   Impingement syndrome, shoulder, right 04/03/2018   Swelling of right hand 11/29/2017   Fatigue 11/29/2017   Palpitations 06/11/2017   DOE (dyspnea on exertion) 09/30/2016   Carotid artery disease ([place]) 09/17/2015   Back pain 08/13/2015   Health care maintenance 02/08/2015   Stress 07/03/2014   Arthritis, degenerative 05/03/2014   Right knee pain 03/27/2014   Menopausal  symptoms 02/27/2014   Dizziness 01/08/2014   Right shoulder pain 04/26/2013   Neck pain 04/07/2013   Hypertension 08/23/2012   Hyperlipidemia LDL goal <70 08/23/2012    Past Surgical History:  Procedure Laterality Date   ABDOMINAL EXPOSURE N/A 11/10/2018   Procedure: ABDOMINAL EXPOSURE;  Surgeon: Angelia Mould, MD;  Location: [place];  Service: Vascular;  Laterality: N/A;   [place] DECOMP/DISCECTOMY FUSION N/A 04/09/2013   Procedure: ANTERIOR CERVICAL DECOMPRESSION/DISCECTOMY FUSION 2 LEVELS;   Surgeon: Floyce Stakes, MD;  Location: [facility];  Service: Neurosurgery;  Laterality: N/A;  Cervical five-six Cervical six-seven  Anterior cervical decompression/diskectomy/fusion   ANTERIOR LUMBAR FUSION N/A 11/10/2018   Procedure: Anterior Lumbar Interbody Fusion-Lumbar five-Sacral one;  Surgeon: Earnie Larsson, MD;  Location: [place];  Service: Neurosurgery;  Laterality: N/A;   BACK SURGERY     ruptured disc   RECONSTRUCTION OF NOSE      OB History   No obstetric history on file.      Home Medications    Prior to Admission medications   Medication Sig Start Date End Date Taking? Authorizing Provider  cephALEXin (KEFLEX) 500 MG capsule Take 1 capsule (500 mg total) by mouth 4 (four) times daily for 7 days. 10/16/22 10/23/22 Yes Laurene Footman B, PA-C  amLODipine (NORVASC) 2.5 MG tablet Take 1 tablet (2.5 mg total) by mouth daily. 01/09/22 10/06/22  End, Harrell Gave, MD  aspirin EC 81 MG tablet Take 81 mg by mouth daily.    [provider]  gabapentin (NEURONTIN) 100 MG capsule TAKE (1) CAPSULE BY MOUTH ONCE DAILY. 10/15/22   Einar Pheasant, MD  losartan-hydrochlorothiazide (HYZAAR) 100-25 MG tablet Take 1 tablet by mouth daily. 07/03/22   Einar Pheasant, MD  meloxicam (MOBIC) 7.5 MG tablet Take 1 tablet by mouth daily as needed.    [provider]  Multiple Vitamins-Minerals (CENTRUM ADULTS PO) Take 1 capsule by mouth 1 day or 1 dose.    [provider]  pantoprazole (PROTONIX) 40 MG tablet TAKE (1) TABLET BY MOUTH DAILY 1 HOUR BEFORE BREAKFAST. 06/19/22   Einar Pheasant, MD  rosuvastatin (CRESTOR) 20 MG tablet TAKE 1 TABLET BY MOUTH ONCE DAILY FOR CHOLESTEROL 08/20/22   End, Harrell Gave, MD    Family History Family History  Problem Relation Age of Onset   Cancer Mother        ovarian/uterine   Heart disease Mother    Heart attack Mother    Stroke Mother    Colon cancer Father    Dementia Father    COPD Brother    Congestive Heart Failure Brother     Stroke Daughter    Heart attack Daughter    Suicidality Brother    Breast cancer Neg Hx     Social History Social History   Tobacco Use   Smoking status: Never   Smokeless tobacco: Never  Vaping Use   Vaping Use: Never used  Substance Use Topics   Alcohol use: Not Currently    Alcohol/week: 0.0 standard drinks of alcohol    Comment: rarely   Drug use: No     Allergies   Patient has no known allergies.   Review of Systems Review of Systems  Constitutional:  Negative for fatigue and fever.  Musculoskeletal:  Positive for arthralgias and joint swelling.  Skin:  Positive for color change. Negative for wound.  Neurological:  Negative for weakness and numbness.     Physical Exam Triage Vital  Signs ED Triage Vitals [10/16/22 1105]  Enc Vitals Group     BP (!) 149/68     Pulse Rate 62     Resp 16     Temp 98.2 F (36.8 C)     Temp Source Oral     SpO2 100 %     Weight      Height      Head Circumference      Peak Flow      Pain Score      Pain Loc      Pain Edu?      Excl. in [place]?    No data found.  Updated Vital Signs BP (!) 149/68 (BP Location: Left Arm)   Pulse 62   Temp 98.2 F (36.8 C) (Oral)   Resp 16   SpO2 100%      Physical Exam Vitals and nursing note reviewed.  Constitutional:      General: She is not in acute distress.    Appearance: Normal appearance. She is not ill-appearing or toxic-appearing.  HENT:     Head: Normocephalic and atraumatic.  Eyes:     General: No scleral icterus.       Right eye: No discharge.        Left eye: No discharge.     Conjunctiva/sclera: Conjunctivae normal.  Cardiovascular:     Rate and Rhythm: Normal rate and regular rhythm.     Pulses: Normal pulses.  Pulmonary:     Effort: Pulmonary effort is normal. No respiratory distress.  Musculoskeletal:     Cervical back: Neck supple.  Skin:    General: Skin is dry.     Comments: Left great toe.  Included an image in the chart of patient's toe.  She she  has increased erythema, warmth and swelling of the cuticle.  No obvious ingrown toenail.  The entire toes warmer than the other toes.  Tenderness along the cuticle.  There is yellow crusting along the medial nail fold.  Neurological:     General: No focal deficit present.     Mental Status: She is alert. Mental status is at baseline.     Motor: No weakness.     Gait: Gait normal.  Psychiatric:        Mood and Affect: Mood normal.        Behavior: Behavior normal.        Thought Content: Thought content normal.      UC Treatments / Results  Labs (all labs ordered are listed, but only abnormal results are displayed) Labs Reviewed - No data to display  EKG   Radiology No results found.  Procedures Procedures (including critical care time)  Medications Ordered in UC Medications  ketorolac (TORADOL) injection 30 mg (30 mg Intramuscular Given 10/16/22 1207)    Initial Impression / Assessment and Plan / UC Course  I have reviewed the triage vital signs and the nursing notes.  Pertinent labs & imaging results that were available during my care of the patient were reviewed by me and considered in my medical decision making (see chart for details).   76 year old female presents for pain, swelling and redness of the left great toe for the past several days.  No fever.  History of chronic low back pain and sciatica on the left side.  Vitals are stable.  She overall well-appearing.  Included an image in the chart of patient's toe.  Looks to be consistent with cellulitis of the cuticle/distal toe.  Will treat with Keflex at this time.  She asked for something for pain.  Patient given 30 mg IM ketorolac in clinic for acute pain relief.  Advised to continue Tylenol Epsom salt soaks.  Advised to keep appointment with podiatrist.  Follow-up here as needed.   Final Clinical Impressions(s) / UC Diagnoses   Final diagnoses:  Cellulitis of left toe  Pain of toe of left foot     Discharge  Instructions      -Cuticle infection.  I sent antibiotics to pharmacy.  Continue with warm Epsom salt soaks.  Tylenol for pain.  This should be looking better in the next couple days but keep your appointment with the podiatrist.  We have given you an injection of Toradol in clinic to help with pain and inflammation.     ED Prescriptions     Medication Sig Dispense Auth. Provider   cephALEXin (KEFLEX) 500 MG capsule Take 1 capsule (500 mg total) by mouth 4 (four) times daily for 7 days. 28 capsule Danton Clap, PA-C      PDMP not reviewed this encounter.   Danton Clap, PA-C 10/16/22 1259

## 2022-10-23 ENCOUNTER — Telehealth: Payer: Self-pay | Admitting: Internal Medicine

## 2022-10-23 ENCOUNTER — Other Ambulatory Visit: Payer: Self-pay

## 2022-10-23 MED ORDER — AMLODIPINE BESYLATE 2.5 MG PO TABS: 2.5000 mg | ORAL_TABLET | Freq: Every day | ORAL | 2 refills | Status: DC

## 2022-10-23 NOTE — Telephone Encounter (Signed)
   Prescription Request  10/23/2022  Is this a "Controlled Substance" medicine? No  LOV: 08/08/2022  What is the name of the medication or equipment? amLODipine (NORVASC) 2.5 MG tablet (Expired)  Have you contacted your pharmacy to request a refill? No   Which pharmacy would you like this sent to?  [place], [place] - [place] [address] [place] [address] Phone: [phone] Fax: [phone]   Patient notified that their request is being sent to the clinical staff for review and that they should receive a response within 2 business days.   Please advise at Mobile [phone]

## 2022-10-23 NOTE — Telephone Encounter (Signed)
sent 

## 2022-10-28 IMAGING — MG MM DIGITAL SCREENING BILAT W/ TOMO AND CAD
8 series · 8 of 24 positions shown · non-contrast
Comparison: Previous exam(s).

CLINICAL DATA: Screening.

EXAM:
DIGITAL SCREENING BILATERAL MAMMOGRAM WITH TOMOSYNTHESIS AND CAD
TECHNIQUE: Bilateral screening digital craniocaudal and mediolateral oblique
mammograms were obtained. Bilateral screening digital breast
tomosynthesis was performed. The images were evaluated with
computer-aided detection.

[L MLO synth-2D]
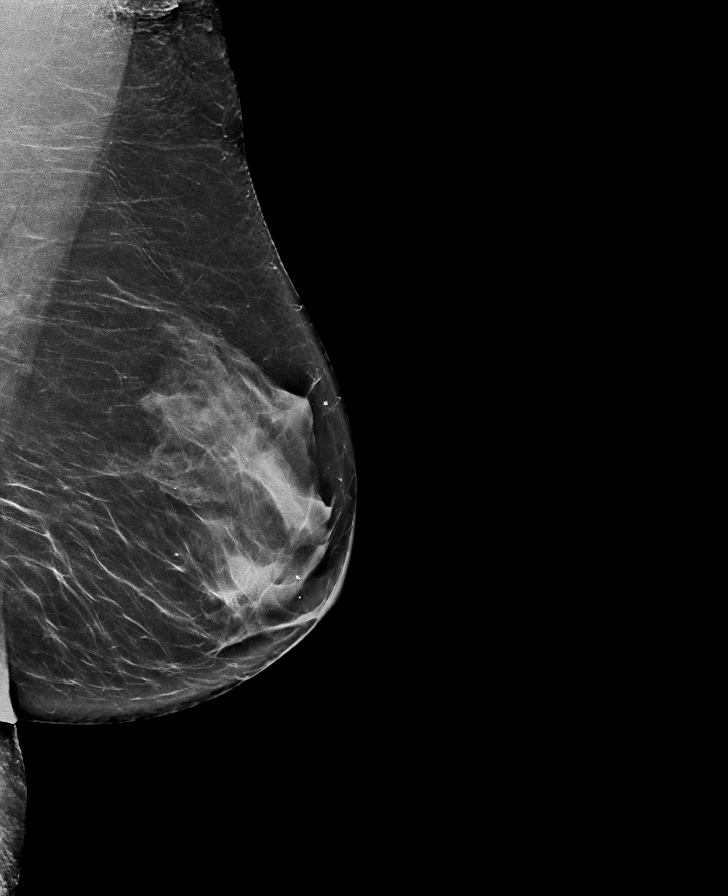

[R MLO synth-2D]
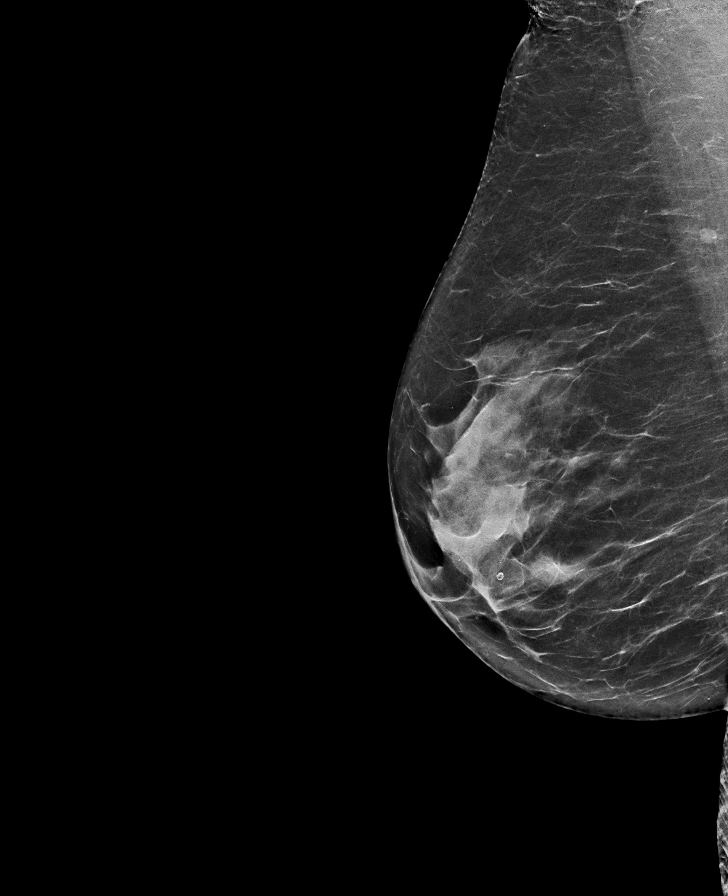

[L CC synth-2D]
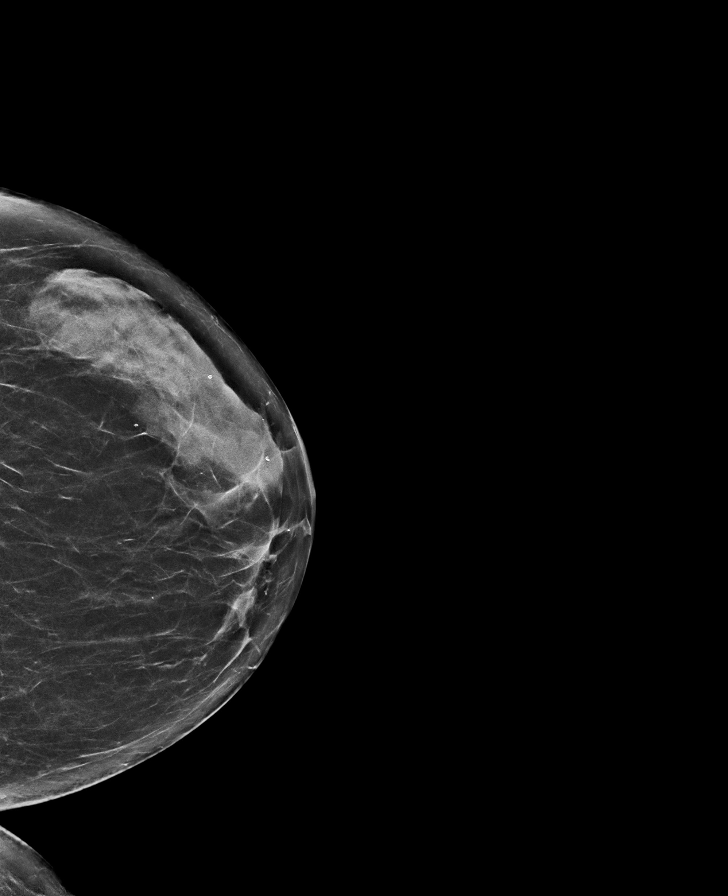

[R CC synth-2D]
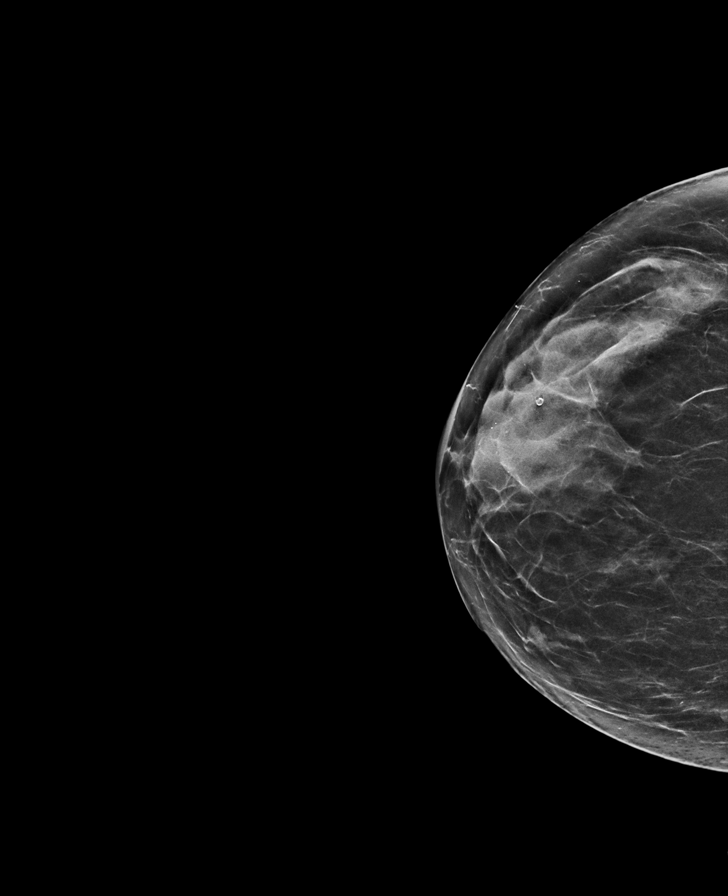

[L MLO tomo · tomo slice 39/76.0]
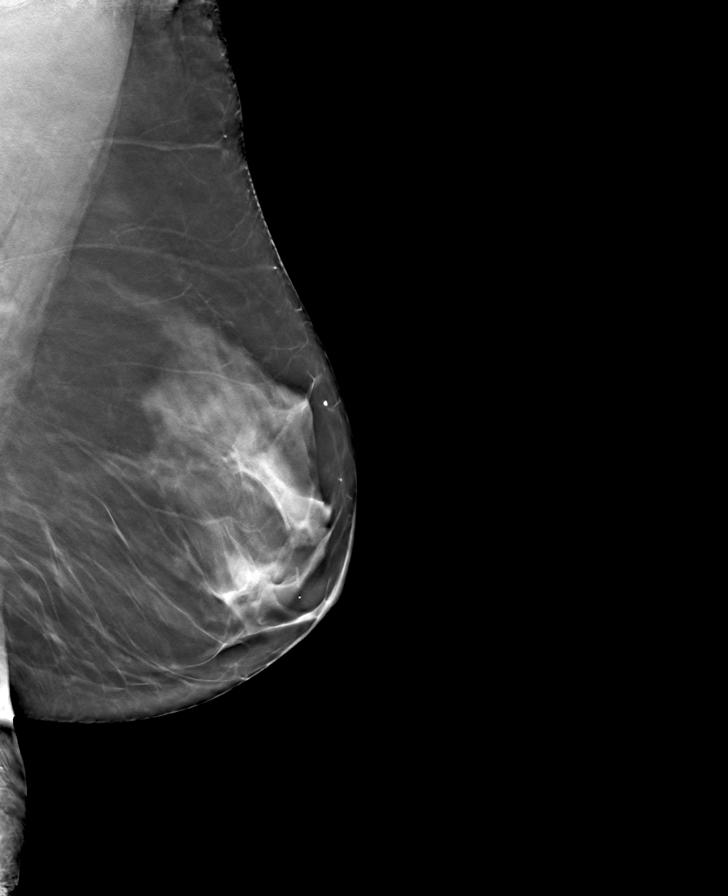

[R CC tomo · tomo slice 29/58.0]
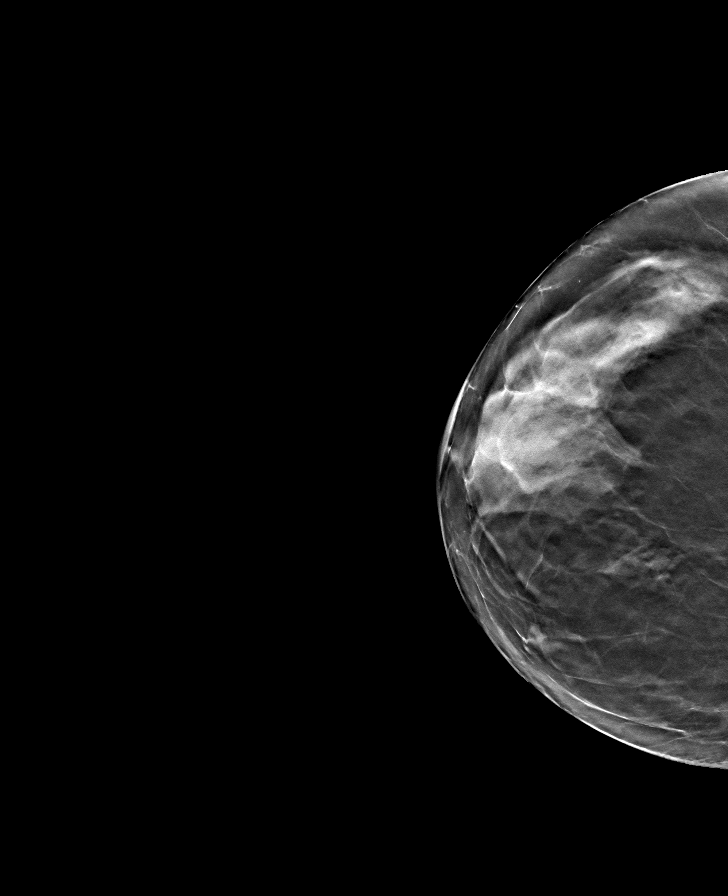

[L CC tomo · tomo slice 31/62.0]
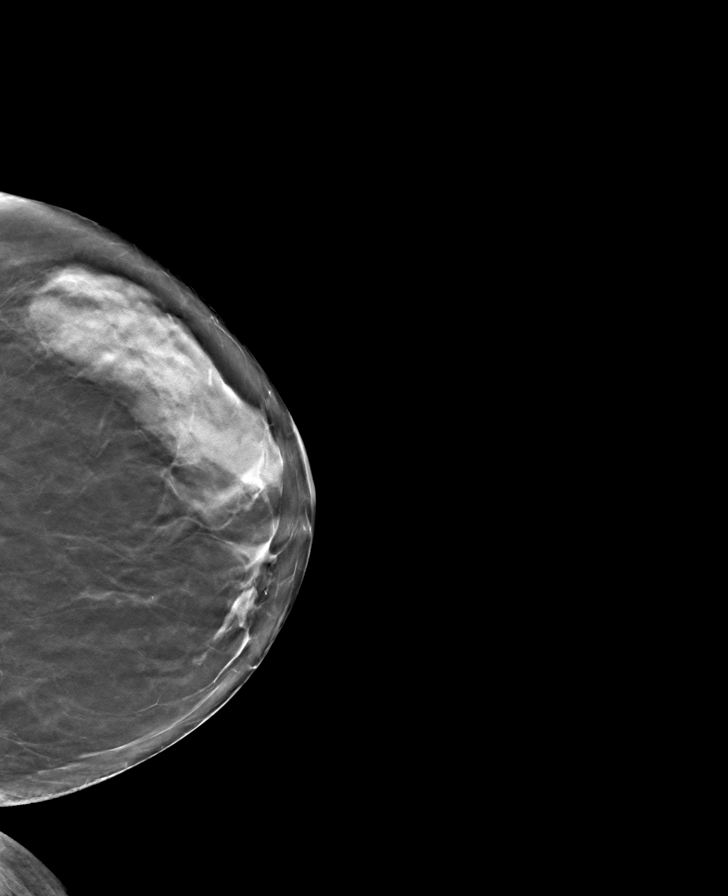

[R MLO tomo · tomo slice 35/70.0]
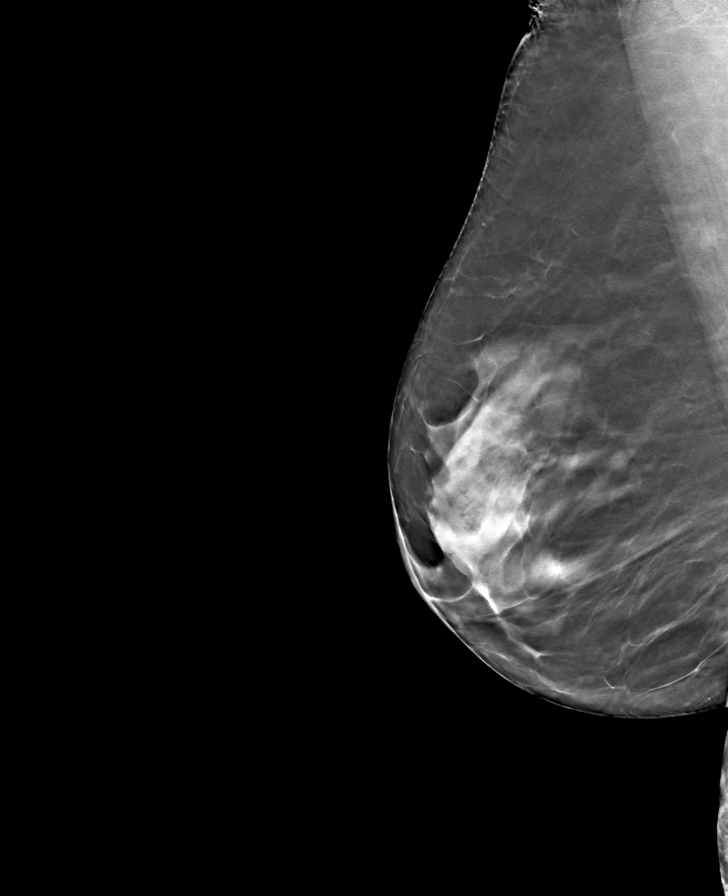

[8 of 24 positions shown; findings below may reference images not displayed]

ACR Breast Density Category c: The breast tissue is heterogeneously
dense, which may obscure small masses.
FINDINGS: There are no findings suspicious for malignancy.
IMPRESSION: No mammographic evidence of malignancy. A result letter of this
screening mammogram will be mailed directly to the patient.

RECOMMENDATION:
Screening mammogram in one year. (Code:Q3-W-BC3)

BI-RADS CATEGORY  1: Negative.

## 2022-10-29 DIAGNOSIS — L03032 Cellulitis of left toe: Secondary | ICD-10-CM | POA: Diagnosis not present

## 2022-10-29 DIAGNOSIS — R739 Hyperglycemia, unspecified: Secondary | ICD-10-CM | POA: Diagnosis not present

## 2022-11-13 ENCOUNTER — Ambulatory Visit (INDEPENDENT_AMBULATORY_CARE_PROVIDER_SITE_OTHER): Payer: Medicare Other | Admitting: Internal Medicine

## 2022-11-13 ENCOUNTER — Ambulatory Visit (INDEPENDENT_AMBULATORY_CARE_PROVIDER_SITE_OTHER): Payer: Medicare Other

## 2022-11-13 ENCOUNTER — Encounter: Payer: Self-pay | Admitting: Internal Medicine

## 2022-11-13 VITALS — BP 130/70 | HR 70 | Temp 98.2°F | Resp 16 | Ht 64.0 in | Wt 151.0 lb

## 2022-11-13 DIAGNOSIS — M19011 Primary osteoarthritis, right shoulder: Secondary | ICD-10-CM | POA: Diagnosis not present

## 2022-11-13 DIAGNOSIS — I779 Disorder of arteries and arterioles, unspecified: Secondary | ICD-10-CM | POA: Diagnosis not present

## 2022-11-13 DIAGNOSIS — I5032 Chronic diastolic (congestive) heart failure: Secondary | ICD-10-CM

## 2022-11-13 DIAGNOSIS — I700 Atherosclerosis of aorta: Secondary | ICD-10-CM

## 2022-11-13 DIAGNOSIS — E785 Hyperlipidemia, unspecified: Secondary | ICD-10-CM

## 2022-11-13 DIAGNOSIS — M25511 Pain in right shoulder: Secondary | ICD-10-CM | POA: Diagnosis not present

## 2022-11-13 DIAGNOSIS — Z0000 Encounter for general adult medical examination without abnormal findings: Secondary | ICD-10-CM

## 2022-11-13 DIAGNOSIS — F439 Reaction to severe stress, unspecified: Secondary | ICD-10-CM

## 2022-11-13 DIAGNOSIS — M25519 Pain in unspecified shoulder: Secondary | ICD-10-CM | POA: Diagnosis not present

## 2022-11-13 DIAGNOSIS — R1084 Generalized abdominal pain: Secondary | ICD-10-CM | POA: Diagnosis not present

## 2022-11-13 DIAGNOSIS — I10 Essential (primary) hypertension: Secondary | ICD-10-CM

## 2022-11-13 DIAGNOSIS — M25512 Pain in left shoulder: Secondary | ICD-10-CM | POA: Diagnosis not present

## 2022-11-13 DIAGNOSIS — R739 Hyperglycemia, unspecified: Secondary | ICD-10-CM | POA: Diagnosis not present

## 2022-11-13 LAB — HEPATIC FUNCTION PANEL
ALT: 21 U/L (ref 0–35)
AST: 21 U/L (ref 0–37)
Albumin: 4.4 g/dL (ref 3.5–5.2)
Alkaline Phosphatase: 65 U/L (ref 39–117)
Bilirubin, Direct: 0.2 mg/dL (ref 0.0–0.3)
Total Bilirubin: 1 mg/dL (ref 0.2–1.2)
Total Protein: 7.1 g/dL (ref 6.0–8.3)

## 2022-11-13 LAB — BASIC METABOLIC PANEL
BUN: 15 mg/dL (ref 6–23)
CO2: 29 mEq/L (ref 19–32)
Calcium: 9.4 mg/dL (ref 8.4–10.5)
Chloride: 102 mEq/L (ref 96–112)
Creatinine, Ser: 0.71 mg/dL (ref 0.40–1.20)
GFR: 83.1 mL/min (ref >60.00)
Glucose, Bld: 96 mg/dL (ref 70–99)
Potassium: 3.7 mEq/L (ref 3.5–5.1)
Sodium: 139 mEq/L (ref 135–145)

## 2022-11-13 LAB — LIPID PANEL
Cholesterol: 181 mg/dL (ref 0–200)
HDL: 61.3 mg/dL (ref >39.00)
LDL Cholesterol: 98 mg/dL (ref 0–99)
NonHDL: 120.04
Total CHOL/HDL Ratio: 3
Triglycerides: 112 mg/dL (ref 0.0–149.0)
VLDL: 22.4 mg/dL (ref 0.0–40.0)

## 2022-11-13 LAB — SEDIMENTATION RATE: Sed Rate: 8 mm/hr (ref 0–30)

## 2022-11-13 LAB — HEMOGLOBIN A1C: Hgb A1c MFr Bld: 6.1 % (ref 4.6–6.5)

## 2022-11-13 MED ORDER — GABAPENTIN 100 MG PO CAPS: ORAL_CAPSULE | ORAL | 0 refills | Status: DC

## 2022-11-13 MED ORDER — LOSARTAN POTASSIUM-HCTZ 100-25 MG PO TABS: 1.0000 | ORAL_TABLET | Freq: Every day | ORAL | 1 refills | Status: DC

## 2022-11-13 MED ORDER — PANTOPRAZOLE SODIUM 40 MG PO TBEC: DELAYED_RELEASE_TABLET | ORAL | 2 refills | Status: DC

## 2022-11-13 NOTE — Progress Notes (Signed)
Subjective:    Patient ID: Kristen Strickland, female    DOB: 05-27-1946, 77 y.o.   MRN: [id number]  Patient here for  Chief Complaint  Patient presents with   Annual Exam    HPI With history of hypertension, HFpEF and hypercholesterolemia.  She comes in today to follow up on these issues as well as for a complete physical exam.  Just saw podiatry 10/29/22 - ingrown toe nail - left great toe. S/p removal of the medial nail border.  Recommended - soaks and triple abx cream.  Toe is much better.  She feels overall things are stable.  No chest pain or increased sob reported.  No increased cough or congestion.  She is having increased shoulder pain.  Has been going on for one year.  Right > left.  Limits activity and has limited rom.  No abdominal pain.  Bowels moving.     Past Medical History:  Diagnosis Date   Arthritis    Carotid arterial disease ([place])    a. 11/2017 Carotid U/S: RICA 2-13%, LICA 08-65%.    Chronic fatigue    Chronic leg pain    DDD (degenerative disc disease), lumbar    Dyspnea on exertion    a. 02/2017 Echo: EF 60-65%, no rwma, mild MR. Nl RV size/fxn. Nl PASP; b. 02/2017 MV: small, mild, fixed basal inferolateral defect - ? artifact vs scar. No ischemia. EF >65%.   Hypercholesterolemia    Hypertension    Insomnia    Palpitations    a. 02/2017 Holter: Avg HR 66 (51-115), rare PACs, four brief runs of PAT - up to 5 beats, max rate 157. Rare isolated PVCs. No sustained arrhythmias; b. 03/2018 24h Holter: Rare PACs, 3 beat run PAT (122 bpm), PVCs (2% of beats).   Radiculopathy    Lumbar region   Sinus bradycardia    Spondylolisthesis    lumbosacral region   Past Surgical History:  Procedure Laterality Date   ABDOMINAL EXPOSURE N/A 11/10/2018   Procedure: ABDOMINAL EXPOSURE;  Surgeon: Angelia Mould, MD;  Location: [facility] OR;  Service: Vascular;  Laterality: N/A;   [place] DECOMP/DISCECTOMY FUSION N/A 04/09/2013   Procedure:  ANTERIOR CERVICAL DECOMPRESSION/DISCECTOMY FUSION 2 LEVELS;  Surgeon: Floyce Stakes, MD;  Location: [facility] ORS;  Service: Neurosurgery;  Laterality: N/A;  Cervical five-six Cervical six-seven  Anterior cervical decompression/diskectomy/fusion   ANTERIOR LUMBAR FUSION N/A 11/10/2018   Procedure: Anterior Lumbar Interbody Fusion-Lumbar five-Sacral one;  Surgeon: Earnie Larsson, MD;  Location: [place];  Service: Neurosurgery;  Laterality: N/A;   BACK SURGERY     ruptured disc   RECONSTRUCTION OF NOSE     Family History  Problem Relation Age of Onset   Cancer Mother        ovarian/uterine   Heart disease Mother    Heart attack Mother    Stroke Mother    Colon cancer Father    Dementia Father    COPD Brother    Congestive Heart Failure Brother    Stroke Daughter    Heart attack Daughter    Suicidality Brother    Breast cancer Neg Hx    Social History   Socioeconomic History   Marital status: Married    Spouse name: Not on file   Number of children: 2   Years of education: Not on file   Highest education level: Not on file  Occupational History   Not on file  Tobacco  Use   Smoking status: Never   Smokeless tobacco: Never  Vaping Use   Vaping Use: Never used  Substance and Sexual Activity   Alcohol use: Not Currently    Alcohol/week: 0.0 standard drinks of alcohol    Comment: rarely   Drug use: No   Sexual activity: Yes  Other Topics Concern   Not on file  Social History Narrative   ** Merged History Encounter **       Social Determinants of Health   Financial Resource Strain: Low Risk  (05/10/2022)   Overall Financial Resource Strain (CARDIA)    Difficulty of Paying Living Expenses: Not hard at all  Food Insecurity: No Food Insecurity (05/10/2022)   Hunger Vital Sign    Worried About Running Out of Food in the Last Year: Never true    Ran Out of Food in the Last Year: Never true  Transportation Needs: No Transportation Needs (05/10/2022)   PRAPARE - Armed forces logistics/support/administrative officer (Medical): No    Lack of Transportation (Non-Medical): No  Physical Activity: Sufficiently Active (05/10/2022)   Exercise Vital Sign    Days of Exercise per Week: 5 days    Minutes of Exercise per Session: 30 min  Stress: No Stress Concern Present (05/10/2022)   [place]    Feeling of Stress : Not at all  Social Connections: Unknown (05/10/2022)   Social Connection and Isolation Panel [NHANES]    Frequency of Communication with Friends and Family: More than three times a week    Frequency of Social Gatherings with Friends and Family: More than three times a week    Attends Religious Services: Not on Advertising copywriter or Organizations: Not on file    Attends Archivist Meetings: Not on file    Marital Status: Not on file     Review of Systems  Constitutional:  Negative for appetite change and unexpected weight change.  HENT:  Negative for congestion, sinus pressure and sore throat.   Eyes:  Negative for pain and visual disturbance.  Respiratory:  Negative for cough, chest tightness and shortness of breath.   Cardiovascular:  Negative for chest pain and palpitations.  Gastrointestinal:  Negative for abdominal pain, diarrhea, nausea and vomiting.  Genitourinary:  Negative for difficulty urinating and dysuria.  Musculoskeletal:  Negative for joint swelling and myalgias.       Shoulder pain as outlined.   Skin:  Negative for color change and rash.  Neurological:  Negative for dizziness and headaches.  Hematological:  Negative for adenopathy. Does not bruise/bleed easily.  Psychiatric/Behavioral:  Negative for agitation and dysphoric mood.        Objective:     BP 130/70   Pulse 70   Temp 98.2 F (36.8 C)   Resp 16   Ht '5\' 4"'$  (1.626 m)   Wt 151 lb (68.5 kg)   SpO2 97%   BMI 25.92 kg/m  Wt Readings from Last 3 Encounters:  11/13/22 151 lb (68.5 kg)  08/08/22 148  lb 12.8 oz (67.5 kg)  07/17/22 149 lb (67.6 kg)    Physical Exam Vitals reviewed.  Constitutional:      General: She is not in acute distress.    Appearance: Normal appearance.  HENT:     Head: Normocephalic and atraumatic.     Right Ear: External ear normal.     Left Ear: External ear normal.  Eyes:     General: No scleral icterus.       Right eye: No discharge.        Left eye: No discharge.     Conjunctiva/sclera: Conjunctivae normal.  Neck:     Thyroid: No thyromegaly.  Cardiovascular:     Rate and Rhythm: Normal rate and regular rhythm.  Pulmonary:     Effort: No respiratory distress.     Breath sounds: Normal breath sounds. No wheezing.  Abdominal:     General: Bowel sounds are normal.     Palpations: Abdomen is soft.     Tenderness: There is no abdominal tenderness.  Musculoskeletal:        General: No swelling or tenderness.     Cervical back: Neck supple. No tenderness.  Lymphadenopathy:     Cervical: No cervical adenopathy.  Skin:    Findings: No erythema or rash.  Neurological:     Mental Status: She is alert.  Psychiatric:        Mood and Affect: Mood normal.        Behavior: Behavior normal.      Outpatient Encounter Medications as of 11/13/2022  Medication Sig   amLODipine (NORVASC) 2.5 MG tablet Take 1 tablet (2.5 mg total) by mouth daily.   aspirin EC 81 MG tablet Take 81 mg by mouth daily.   meloxicam (MOBIC) 7.5 MG tablet Take 1 tablet by mouth daily as needed.   Multiple Vitamins-Minerals (CENTRUM ADULTS PO) Take 1 capsule by mouth 1 day or 1 dose.   rosuvastatin (CRESTOR) 20 MG tablet TAKE 1 TABLET BY MOUTH ONCE DAILY FOR CHOLESTEROL   [DISCONTINUED] gabapentin (NEURONTIN) 100 MG capsule TAKE (1) CAPSULE BY MOUTH ONCE DAILY.   [DISCONTINUED] losartan-hydrochlorothiazide (HYZAAR) 100-25 MG tablet Take 1 tablet by mouth daily.   [DISCONTINUED] pantoprazole (PROTONIX) 40 MG tablet TAKE (1) TABLET BY MOUTH DAILY 1 HOUR BEFORE BREAKFAST.    gabapentin (NEURONTIN) 100 MG capsule TAKE (1) CAPSULE BY MOUTH ONCE DAILY.   losartan-hydrochlorothiazide (HYZAAR) 100-25 MG tablet Take 1 tablet by mouth daily.   pantoprazole (PROTONIX) 40 MG tablet TAKE (1) TABLET BY MOUTH DAILY 1 HOUR BEFORE BREAKFAST.   No facility-administered encounter medications on file as of 11/13/2022.     Lab Results  Component Value Date   WBC 4.1 08/08/2022   HGB 12.2 08/08/2022   HCT 37.2 08/08/2022   PLT 212.0 08/08/2022   GLUCOSE 96 11/13/2022   CHOL 181 11/13/2022   TRIG 112.0 11/13/2022   HDL 61.30 11/13/2022   LDLDIRECT 151.7 08/16/2013   LDLCALC 98 11/13/2022   ALT 21 11/13/2022   AST 21 11/13/2022   NA 139 11/13/2022   K 3.7 11/13/2022   CL 102 11/13/2022   CREATININE 0.71 11/13/2022   BUN 15 11/13/2022   CO2 29 11/13/2022   TSH 3.79 08/08/2022   HGBA1C 6.1 11/13/2022    No results found.     Assessment & Plan:  Primary hypertension Assessment & Plan: Blood pressure as outlined.  Continues on losartan/hctz and amlodipine.  Follow pressures.  Follow metabolic panel.   Orders: -     Basic metabolic panel  Hyperlipidemia LDL goal <70 Assessment & Plan: On crestor.  Low cholesterol diet and exercise.  Follow lipid panel and liver function tests.    Orders: -     Hepatic function panel -     Lipid panel  Hyperglycemia Assessment & Plan: Low carb diet and exercise.  Follow met b and a1c.  Orders: -     Hemoglobin A1c  Health care maintenance Assessment & Plan: Physical today 11/13/22  Declined breast exam.  Mammogram 01/01/22 - Birads I.  Need colonoscopy report.  Nurse to request.    Generalized abdominal pain Assessment & Plan: Saw GI previously.  Discussed avoiding artificial sweeteners, mountain dew, etc.  Is better.  Eating.  Follow.    Orders: -     Pantoprazole Sodium; TAKE (1) TABLET BY MOUTH DAILY 1 HOUR BEFORE BREAKFAST.  Dispense: 90 tablet; Refill: 2  Shoulder pain, unspecified chronicity, unspecified  laterality Assessment & Plan: Bilateral shoulder pain.  Persistent.  Check esr.  Check xray.  Further w/up pending results.   Orders: -     DG Shoulder Left; Future -     DG Shoulder Right; Future -     Sedimentation rate  Aortic atherosclerosis (HCC) Assessment & Plan: Continue crestor.    Bilateral carotid artery disease, unspecified type [facility]) Assessment & Plan: Dr Lucky Cowboy (06/28/22) -Carotid duplex today shows velocities in the 1 to 39% range on the right and 40 to 59% range on the left albeit the higher end of this range. Continue aspirin and Crestor. No role for intervention at this level.  Follow-up in 1 year with carotid duplex.    Chronic heart failure with preserved ejection fraction (HFpEF) ([employer]) Assessment & Plan: Continues on losartan/hctz.  No evidence of volume overload on exam.  Follow.    Stress Assessment & Plan: Overall appears to be handling things well. Follow.    Other orders -     Gabapentin; TAKE (1) CAPSULE BY MOUTH ONCE DAILY.  Dispense: 90 capsule; Refill: 0 -     Losartan Potassium-HCTZ; Take 1 tablet by mouth daily.  Dispense: 90 tablet; Refill: 1     Einar Pheasant, MD

## 2022-11-13 NOTE — Assessment & Plan Note (Signed)
Physical today 11/13/22  Declined breast exam.  Mammogram 01/01/22 - Birads I.  Need colonoscopy report.  Nurse to request.

## 2022-11-14 DIAGNOSIS — L03032 Cellulitis of left toe: Secondary | ICD-10-CM | POA: Diagnosis not present

## 2022-11-17 ENCOUNTER — Encounter: Payer: Self-pay | Admitting: Internal Medicine

## 2022-11-17 NOTE — Assessment & Plan Note (Signed)
Dr Lucky Cowboy (06/28/22) -Carotid duplex today shows velocities in the 1 to 39% range on the right and 40 to 59% range on the left albeit the higher end of this range. Continue aspirin and Crestor. No role for intervention at this level.  Follow-up in 1 year with carotid duplex.

## 2022-11-17 NOTE — Assessment & Plan Note (Signed)
Continue crestor 

## 2022-11-17 NOTE — Assessment & Plan Note (Signed)
Blood pressure as outlined.  Continues on losartan/hctz and amlodipine.  Follow pressures.  Follow metabolic panel.

## 2022-11-17 NOTE — Assessment & Plan Note (Signed)
Bilateral shoulder pain.  Persistent.  Check esr.  Check xray.  Further w/up pending results.

## 2022-11-17 NOTE — Assessment & Plan Note (Signed)
Saw GI previously.  Discussed avoiding artificial sweeteners, mountain dew, etc.  Is better.  Eating.  Follow.

## 2022-11-17 NOTE — Assessment & Plan Note (Signed)
Continues on losartan/hctz.  No evidence of volume overload on exam.  Follow.

## 2022-11-17 NOTE — Assessment & Plan Note (Signed)
Low carb diet and exercise.  Follow met b and a1c.  

## 2022-11-17 NOTE — Assessment & Plan Note (Signed)
Overall appears to be handling things well.  Follow.  ?

## 2022-11-17 NOTE — Assessment & Plan Note (Signed)
On crestor.  Low cholesterol diet and exercise.  Follow lipid panel and liver function tests.   

## 2022-11-20 ENCOUNTER — Telehealth: Payer: Self-pay | Admitting: Internal Medicine

## 2022-11-20 NOTE — Telephone Encounter (Signed)
Patient returned office phone call for lab resutls.

## 2022-11-20 NOTE — Telephone Encounter (Signed)
See result note.  

## 2022-12-18 ENCOUNTER — Other Ambulatory Visit: Payer: Self-pay | Admitting: Internal Medicine

## 2022-12-18 DIAGNOSIS — Z1231 Encounter for screening mammogram for malignant neoplasm of breast: Secondary | ICD-10-CM

## 2023-01-14 ENCOUNTER — Ambulatory Visit
Admission: RE | Admit: 2023-01-14 | Discharge: 2023-01-14 | Disposition: A | Discharge status: Home / Self Care | Payer: Medicare Other | Source: Ambulatory Visit | Attending: Internal Medicine | Admitting: Internal Medicine

## 2023-01-14 DIAGNOSIS — Z1231 Encounter for screening mammogram for malignant neoplasm of breast: Secondary | ICD-10-CM | POA: Insufficient documentation

## 2023-01-21 ENCOUNTER — Telehealth: Payer: Self-pay | Admitting: Internal Medicine

## 2023-01-21 NOTE — Telephone Encounter (Signed)
Patient called and is requesting a referral for her aching shoulders, and legs. Legs are hurting all the time.

## 2023-01-22 NOTE — Telephone Encounter (Signed)
Appt scheduled with Dr Nicki Reaper next week to evaluate and determine if referral is needed. This has been going on for a month.

## 2023-01-28 ENCOUNTER — Encounter: Payer: Self-pay | Admitting: Internal Medicine

## 2023-01-28 ENCOUNTER — Ambulatory Visit (INDEPENDENT_AMBULATORY_CARE_PROVIDER_SITE_OTHER): Payer: Medicare Other | Admitting: Internal Medicine

## 2023-01-28 VITALS — BP 128/68 | HR 66 | Temp 98.0°F | Resp 16 | Ht 64.0 in | Wt 149.2 lb

## 2023-01-28 DIAGNOSIS — E785 Hyperlipidemia, unspecified: Secondary | ICD-10-CM | POA: Diagnosis not present

## 2023-01-28 DIAGNOSIS — I10 Essential (primary) hypertension: Secondary | ICD-10-CM

## 2023-01-28 DIAGNOSIS — R739 Hyperglycemia, unspecified: Secondary | ICD-10-CM

## 2023-01-28 DIAGNOSIS — I700 Atherosclerosis of aorta: Secondary | ICD-10-CM | POA: Diagnosis not present

## 2023-01-28 DIAGNOSIS — F439 Reaction to severe stress, unspecified: Secondary | ICD-10-CM | POA: Diagnosis not present

## 2023-01-28 DIAGNOSIS — I5032 Chronic diastolic (congestive) heart failure: Secondary | ICD-10-CM

## 2023-01-28 DIAGNOSIS — M79606 Pain in leg, unspecified: Secondary | ICD-10-CM | POA: Diagnosis not present

## 2023-01-28 DIAGNOSIS — M25511 Pain in right shoulder: Secondary | ICD-10-CM

## 2023-01-28 DIAGNOSIS — M25562 Pain in left knee: Secondary | ICD-10-CM

## 2023-01-28 LAB — CBC WITH DIFFERENTIAL/PLATELET
Basophils Absolute: 0.1 10*3/uL (ref 0.0–0.1)
Basophils Relative: 1.3 % (ref 0.0–3.0)
Eosinophils Absolute: 0.1 10*3/uL (ref 0.0–0.7)
Eosinophils Relative: 2.2 % (ref 0.0–5.0)
HCT: 36.7 % (ref 36.0–46.0)
Hemoglobin: 12.6 g/dL (ref 12.0–15.0)
Lymphocytes Relative: 24.3 % (ref 12.0–46.0)
Lymphs Abs: 1.2 10*3/uL (ref 0.7–4.0)
MCHC: 34.3 g/dL (ref 30.0–36.0)
MCV: 89.5 fl (ref 78.0–100.0)
Monocytes Absolute: 0.3 10*3/uL (ref 0.1–1.0)
Monocytes Relative: 6.8 % (ref 3.0–12.0)
Neutro Abs: 3.3 10*3/uL (ref 1.4–7.7)
Neutrophils Relative %: 65.4 % (ref 43.0–77.0)
Platelets: 235 10*3/uL (ref 150.0–400.0)
RBC: 4.1 Mil/uL (ref 3.87–5.11)
RDW: 12.9 % (ref 11.5–15.5)
WBC: 5.1 10*3/uL (ref 4.0–10.5)

## 2023-01-28 LAB — CK: Total CK: 135 U/L (ref 7–177)

## 2023-01-28 LAB — SEDIMENTATION RATE: Sed Rate: 19 mm/hr (ref 0–30)

## 2023-01-28 NOTE — Progress Notes (Signed)
Subjective:    Patient ID: Kristen Strickland, female    DOB: 30-Apr-1946, 77 y.o.   MRN: [id number]  Patient here for  Chief Complaint  Patient presents with   Leg Pain   Shoulder Pain    HPI Work in appt for shoulder and leg aching.  Last visit - reported shoulder pain.  Xray - right shoulder - arthritis changes.  Had recommended PT referral. Declined.  Persistent right shoulder pain.  Discussed ortho referral.  Limits activity.  No chest pain or sob reported.  No headache.  No rash.  Some leg aching as well.  Appears to now be more localized to left knee.  No known injury or trauma.     Past Medical History:  Diagnosis Date   Arthritis    Carotid arterial disease    a. 11/2017 Carotid U/S: RICA 1-39%, LICA 40-59%.    Chronic fatigue    Chronic leg pain    DDD (degenerative disc disease), lumbar    Dyspnea on exertion    a. 02/2017 Echo: EF 60-65%, no rwma, mild MR. Nl RV size/fxn. Nl PASP; b. 02/2017 MV: small, mild, fixed basal inferolateral defect - ? artifact vs scar. No ischemia. EF >65%.   Hypercholesterolemia    Hypertension    Insomnia    Palpitations    a. 02/2017 Holter: Avg HR 66 (51-115), rare PACs, four brief runs of PAT - up to 5 beats, max rate 157. Rare isolated PVCs. No sustained arrhythmias; b. 03/2018 24h Holter: Rare PACs, 3 beat run PAT (122 bpm), PVCs (2% of beats).   Radiculopathy    Lumbar region   Sinus bradycardia    Spondylolisthesis    lumbosacral region   Past Surgical History:  Procedure Laterality Date   ABDOMINAL EXPOSURE N/A 11/10/2018   Procedure: ABDOMINAL EXPOSURE;  Surgeon: Chuck Hint, MD;  [facility] OR;  Service: Vascular;  Laterality: N/A;   ABDOMINAL HYSTERECTOMY  1975   ANTERIOR CERVICAL DECOMP/DISCECTOMY FUSION N/A 04/09/2013   Procedure: ANTERIOR CERVICAL DECOMPRESSION/DISCECTOMY FUSION 2 LEVELS;  Surgeon: Karn Cassis, MD;  Location: [facility] OR[facility];  Service: Neurosurgery;  Laterality: N/A;  Cervical five-six Cervical  six-seven  Anterior cervical decompression/diskectomy/fusion   ANTERIOR LUMBAR FUSION N/A 11/10/2018   Procedure: Anterior Lumbar Interbody Fusion-Lumbar five-Sacral one;  Surgeon: Julio Sicks, MD;  Location: [facility] OR;  Service: Neurosurgery;  Laterality: N/A;   BACK SURGERY     ruptured disc   RECONSTRUCTION OF NOSE     Family History  Problem Relation Age of Onset   Cancer Mother        ovarian/uterine   Heart disease Mother    Heart attack Mother    Stroke Mother    Colon cancer Father    Dementia Father    COPD Brother    Congestive Heart Failure Brother    Stroke Daughter    Heart attack Daughter    Suicidality Brother    Breast cancer Neg Hx    Social History   Socioeconomic History   Marital status: Married    Spouse name: Not on file   Number of children: 2   Years of education: Not on file   Highest education level: Not on file  Occupational History   Not on file  Tobacco Use   Smoking status: Never   Smokeless tobacco: Never  Vaping Use   Vaping Use: Never used  Substance and Sexual Activity   Alcohol use: Not Currently  Alcohol/week: 0.0 standard drinks of alcohol    Comment: rarely   Drug use: No   Sexual activity: Yes  Other Topics Concern   Not on file  Social History Narrative   ** Merged History Encounter **       Social Determinants of Health   Financial Resource Strain: Low Risk  (05/10/2022)   Overall Financial Resource Strain (CARDIA)    Difficulty of Paying Living Expenses: Not hard at all  Food Insecurity: No Food Insecurity (05/10/2022)   Hunger Vital Sign    Worried About Running Out of Food in the Last Year: Never true    Ran Out of Food in the Last Year: Never true  Transportation Needs: No Transportation Needs (05/10/2022)   PRAPARE - Administrator, Civil ServiceTransportation    Lack of Transportation (Medical): No    Lack of Transportation (Non-Medical): No  Physical Activity: Sufficiently Active (05/10/2022)   Exercise Vital Sign    Days of Exercise per  Week: 5 days    Minutes of Exercise per Session: 30 min  Stress: No Stress Concern Present (05/10/2022)   [employer] - Occupational Stress Questionnaire    Feeling of Stress : Not at all  Social Connections: Unknown (05/10/2022)   Social Connection and Isolation Panel [NHANES]    Frequency of Communication with Friends and Family: More than three times a week    Frequency of Social Gatherings with Friends and Family: More than three times a week    Attends Religious Services: Not on Marketing executivefile    Active Member of Clubs or Organizations: Not on file    Attends BankerClub or Organization Meetings: Not on file    Marital Status: Not on file     Review of Systems  Constitutional:  Negative for appetite change and fever.  HENT:  Negative for congestion and sinus pressure.   Respiratory:  Negative for cough, chest tightness and shortness of breath.   Cardiovascular:  Negative for chest pain and palpitations.  Gastrointestinal:  Negative for abdominal pain, diarrhea, nausea and vomiting.  Genitourinary:  Negative for difficulty urinating and dysuria.  Musculoskeletal:        Right shoulder pain as outlined.  Also left knee pain.    Skin:  Negative for color change and rash.  Neurological:  Negative for dizziness and headaches.  Psychiatric/Behavioral:  Negative for agitation and dysphoric mood.        Objective:     BP 128/68   Pulse 66   Temp 98 F (36.7 C)   Resp 16   Ht 5\' 4"  (1.626 m)   Wt 149 lb 3.2 oz (67.7 kg)   SpO2 98%   BMI 25.61 kg/m  Wt Readings from Last 3 Encounters:  01/28/23 149 lb 3.2 oz (67.7 kg)  11/13/22 151 lb (68.5 kg)  08/08/22 148 lb 12.8 oz (67.5 kg)    Physical Exam Vitals reviewed.  Constitutional:      General: She is not in acute distress.    Appearance: Normal appearance.  HENT:     Head: Normocephalic and atraumatic.     Right Ear: External ear normal.     Left Ear: External ear normal.  Eyes:     General: No scleral  icterus.       Right eye: No discharge.        Left eye: No discharge.     Conjunctiva/sclera: Conjunctivae normal.  Neck:     Thyroid: No thyromegaly.  Cardiovascular:  Rate and Rhythm: Normal rate and regular rhythm.  Pulmonary:     Effort: No respiratory distress.     Breath sounds: Normal breath sounds. No wheezing.  Abdominal:     General: Bowel sounds are normal.     Palpations: Abdomen is soft.     Tenderness: There is no abdominal tenderness.  Musculoskeletal:        General: No swelling.     Cervical back: Neck supple. No tenderness.     Comments: Limited rom - right shoulder.    Lymphadenopathy:     Cervical: No cervical adenopathy.  Skin:    Findings: No erythema or rash.  Neurological:     Mental Status: She is alert.  Psychiatric:        Mood and Affect: Mood normal.        Behavior: Behavior normal.      Outpatient Encounter Medications as of 01/28/2023  Medication Sig   amLODipine (NORVASC) 2.5 MG tablet Take 1 tablet (2.5 mg total) by mouth daily.   aspirin EC 81 MG tablet Take 81 mg by mouth daily.   gabapentin (NEURONTIN) 100 MG capsule TAKE (1) CAPSULE BY MOUTH ONCE DAILY.   losartan-hydrochlorothiazide (HYZAAR) 100-25 MG tablet Take 1 tablet by mouth daily.   meloxicam (MOBIC) 7.5 MG tablet Take 1 tablet by mouth daily as needed.   Multiple Vitamins-Minerals (CENTRUM ADULTS PO) Take 1 capsule by mouth 1 day or 1 dose.   pantoprazole (PROTONIX) 40 MG tablet TAKE (1) TABLET BY MOUTH DAILY 1 HOUR BEFORE BREAKFAST.   rosuvastatin (CRESTOR) 20 MG tablet TAKE 1 TABLET BY MOUTH ONCE DAILY FOR CHOLESTEROL   No facility-administered encounter medications on file as of 01/28/2023.     Lab Results  Component Value Date   WBC 5.1 01/28/2023   HGB 12.6 01/28/2023   HCT 36.7 01/28/2023   PLT 235.0 01/28/2023   GLUCOSE 96 11/13/2022   CHOL 181 11/13/2022   TRIG 112.0 11/13/2022   HDL 61.30 11/13/2022   LDLDIRECT 151.7 08/16/2013   LDLCALC 98 11/13/2022    ALT 21 11/13/2022   AST 21 11/13/2022   NA 139 11/13/2022   K 3.7 11/13/2022   CL 102 11/13/2022   CREATININE 0.71 11/13/2022   BUN 15 11/13/2022   CO2 29 11/13/2022   TSH 3.79 08/08/2022   HGBA1C 6.1 11/13/2022    MM 3D SCREEN BREAST BILATERAL  Result Date: 01/15/2023 CLINICAL DATA:  Screening. EXAM: DIGITAL SCREENING BILATERAL MAMMOGRAM WITH TOMOSYNTHESIS AND CAD TECHNIQUE: Bilateral screening digital craniocaudal and mediolateral oblique mammograms were obtained. Bilateral screening digital breast tomosynthesis was performed. The images were evaluated with computer-aided detection. COMPARISON:  Previous exam(s). ACR Breast Density Category c: The breasts are heterogeneously dense, which may obscure small masses. FINDINGS: There are no findings suspicious for malignancy. IMPRESSION: No mammographic evidence of malignancy. A result letter of this screening mammogram will be mailed directly to the patient. RECOMMENDATION: Screening mammogram in one year. (Code:SM-B-01Y) BI-RADS CATEGORY  1: Negative. Electronically Signed   By: Beckie SaltsSteven  Reid M.D.   On: 01/15/2023 15:20       Assessment & Plan:  Pain of lower extremity, unspecified laterality -     CBC with Differential/Platelet -     Sedimentation rate -     CK -     Ambulatory referral to Orthopedic Surgery  Left knee pain, unspecified chronicity Assessment & Plan: Increased left knee pain.  Initially described leg aching.  Appears to be more localized to left  knee.  On meloxicam.  Given persistent increased pain, will have ortho evaluate.    Orders: -     Ambulatory referral to Orthopedic Surgery  Aortic atherosclerosis Assessment & Plan: Continue crestor.    Chronic heart failure with preserved ejection fraction (HFpEF) Assessment & Plan: Continues on losartan/hctz.  No evidence of volume overload on exam.  Follow.    Hyperglycemia Assessment & Plan: Low carb diet and exercise.  Follow met b and a1c.    Hyperlipidemia  LDL goal <70 Assessment & Plan: On crestor.  Low cholesterol diet and exercise.  Follow lipid panel and liver function tests.     Primary hypertension Assessment & Plan: Blood pressure as outlined.  Continues on losartan/hctz and amlodipine.  Follow pressures.  Follow metabolic panel.    Right shoulder pain, unspecified chronicity Assessment & Plan: Has been evaluated previously.  S/p injection.  Increased pain recently.  Xray as outlined.  Refer to ortho for further evaluation and treatment.    Stress Assessment & Plan: Overall appears to be handling things well. Follow.       Dale [place], MD

## 2023-02-02 ENCOUNTER — Encounter: Payer: Self-pay | Admitting: Internal Medicine

## 2023-02-02 NOTE — Assessment & Plan Note (Signed)
Continues on losartan/hctz.  No evidence of volume overload on exam.  Follow.  

## 2023-02-02 NOTE — Assessment & Plan Note (Signed)
Overall appears to be handling things well.  Follow.  ?

## 2023-02-02 NOTE — Assessment & Plan Note (Signed)
Increased left knee pain.  Initially described leg aching.  Appears to be more localized to left knee.  On meloxicam.  Given persistent increased pain, will have ortho evaluate.

## 2023-02-02 NOTE — Assessment & Plan Note (Signed)
Continue crestor 

## 2023-02-02 NOTE — Assessment & Plan Note (Signed)
Blood pressure as outlined.  Continues on losartan/hctz and amlodipine.  Follow pressures.  Follow metabolic panel.  

## 2023-02-02 NOTE — Assessment & Plan Note (Signed)
Has been evaluated previously.  S/p injection.  Increased pain recently.  Xray as outlined.  Refer to ortho for further evaluation and treatment.

## 2023-02-02 NOTE — Assessment & Plan Note (Signed)
On crestor.  Low cholesterol diet and exercise.  Follow lipid panel and liver function tests.   

## 2023-02-02 NOTE — Assessment & Plan Note (Signed)
Low carb diet and exercise.  Follow met b and a1c.   

## 2023-02-04 DIAGNOSIS — R739 Hyperglycemia, unspecified: Secondary | ICD-10-CM | POA: Diagnosis not present

## 2023-02-04 DIAGNOSIS — L03032 Cellulitis of left toe: Secondary | ICD-10-CM | POA: Diagnosis not present

## 2023-02-13 DIAGNOSIS — M25562 Pain in left knee: Secondary | ICD-10-CM | POA: Diagnosis not present

## 2023-02-13 DIAGNOSIS — M7591 Shoulder lesion, unspecified, right shoulder: Secondary | ICD-10-CM | POA: Diagnosis not present

## 2023-02-18 DIAGNOSIS — L03032 Cellulitis of left toe: Secondary | ICD-10-CM | POA: Diagnosis not present

## 2023-03-06 DIAGNOSIS — M25511 Pain in right shoulder: Secondary | ICD-10-CM | POA: Diagnosis not present

## 2023-03-11 DIAGNOSIS — M25511 Pain in right shoulder: Secondary | ICD-10-CM | POA: Diagnosis not present

## 2023-03-12 DIAGNOSIS — M25511 Pain in right shoulder: Secondary | ICD-10-CM | POA: Diagnosis not present

## 2023-03-18 ENCOUNTER — Ambulatory Visit (INDEPENDENT_AMBULATORY_CARE_PROVIDER_SITE_OTHER): Payer: Medicare Other | Admitting: Internal Medicine

## 2023-03-18 VITALS — BP 128/70 | HR 65 | Temp 97.9°F | Resp 16 | Ht 64.0 in | Wt 150.2 lb

## 2023-03-18 DIAGNOSIS — M7541 Impingement syndrome of right shoulder: Secondary | ICD-10-CM

## 2023-03-18 DIAGNOSIS — R739 Hyperglycemia, unspecified: Secondary | ICD-10-CM | POA: Diagnosis not present

## 2023-03-18 DIAGNOSIS — G720 Drug-induced myopathy: Secondary | ICD-10-CM

## 2023-03-18 DIAGNOSIS — M79604 Pain in right leg: Secondary | ICD-10-CM | POA: Diagnosis not present

## 2023-03-18 DIAGNOSIS — T466X5A Adverse effect of antihyperlipidemic and antiarteriosclerotic drugs, initial encounter: Secondary | ICD-10-CM

## 2023-03-18 DIAGNOSIS — I5032 Chronic diastolic (congestive) heart failure: Secondary | ICD-10-CM | POA: Diagnosis not present

## 2023-03-18 DIAGNOSIS — I779 Disorder of arteries and arterioles, unspecified: Secondary | ICD-10-CM

## 2023-03-18 DIAGNOSIS — R233 Spontaneous ecchymoses: Secondary | ICD-10-CM | POA: Diagnosis not present

## 2023-03-18 DIAGNOSIS — F439 Reaction to severe stress, unspecified: Secondary | ICD-10-CM | POA: Diagnosis not present

## 2023-03-18 DIAGNOSIS — M79605 Pain in left leg: Secondary | ICD-10-CM | POA: Diagnosis not present

## 2023-03-18 DIAGNOSIS — I700 Atherosclerosis of aorta: Secondary | ICD-10-CM | POA: Diagnosis not present

## 2023-03-18 DIAGNOSIS — R413 Other amnesia: Secondary | ICD-10-CM

## 2023-03-18 DIAGNOSIS — E785 Hyperlipidemia, unspecified: Secondary | ICD-10-CM | POA: Diagnosis not present

## 2023-03-18 DIAGNOSIS — D692 Other nonthrombocytopenic purpura: Secondary | ICD-10-CM | POA: Insufficient documentation

## 2023-03-18 DIAGNOSIS — I10 Essential (primary) hypertension: Secondary | ICD-10-CM

## 2023-03-18 LAB — CBC WITH DIFFERENTIAL/PLATELET
Basophils Absolute: 0.1 10*3/uL (ref 0.0–0.1)
Basophils Relative: 1.5 % (ref 0.0–3.0)
Eosinophils Absolute: 0.1 10*3/uL (ref 0.0–0.7)
Eosinophils Relative: 1.6 % (ref 0.0–5.0)
HCT: 37.6 % (ref 36.0–46.0)
Hemoglobin: 12.4 g/dL (ref 12.0–15.0)
Lymphocytes Relative: 18.6 % (ref 12.0–46.0)
Lymphs Abs: 1 10*3/uL (ref 0.7–4.0)
MCHC: 33 g/dL (ref 30.0–36.0)
MCV: 90.8 fl (ref 78.0–100.0)
Monocytes Absolute: 0.4 10*3/uL (ref 0.1–1.0)
Monocytes Relative: 7.8 % (ref 3.0–12.0)
Neutro Abs: 3.6 10*3/uL (ref 1.4–7.7)
Neutrophils Relative %: 70.5 % (ref 43.0–77.0)
Platelets: 254 10*3/uL (ref 150.0–400.0)
RBC: 4.14 Mil/uL (ref 3.87–5.11)
RDW: 13.5 % (ref 11.5–15.5)
WBC: 5.1 10*3/uL (ref 4.0–10.5)

## 2023-03-18 LAB — BASIC METABOLIC PANEL
BUN: 18 mg/dL (ref 6–23)
CO2: 27 mEq/L (ref 19–32)
Calcium: 9.1 mg/dL (ref 8.4–10.5)
Chloride: 101 mEq/L (ref 96–112)
Creatinine, Ser: 0.81 mg/dL (ref 0.40–1.20)
GFR: 70.77 mL/min (ref >60.00)
Glucose, Bld: 103 mg/dL — ABNORMAL HIGH (ref 70–99)
Potassium: 3.7 mEq/L (ref 3.5–5.1)
Sodium: 136 mEq/L (ref 135–145)

## 2023-03-18 LAB — LIPID PANEL
Cholesterol: 232 mg/dL — ABNORMAL HIGH (ref 0–200)
HDL: 58.3 mg/dL (ref >39.00)
LDL Cholesterol: 154 mg/dL — ABNORMAL HIGH (ref 0–99)
NonHDL: 174.15
Total CHOL/HDL Ratio: 4
Triglycerides: 103 mg/dL (ref 0.0–149.0)
VLDL: 20.6 mg/dL (ref 0.0–40.0)

## 2023-03-18 LAB — HEPATIC FUNCTION PANEL
ALT: 20 U/L (ref 0–35)
AST: 19 U/L (ref 0–37)
Albumin: 4 g/dL (ref 3.5–5.2)
Alkaline Phosphatase: 64 U/L (ref 39–117)
Bilirubin, Direct: 0.1 mg/dL (ref 0.0–0.3)
Total Bilirubin: 0.8 mg/dL (ref 0.2–1.2)
Total Protein: 6.6 g/dL (ref 6.0–8.3)

## 2023-03-18 LAB — HEMOGLOBIN A1C: Hgb A1c MFr Bld: 5.9 % (ref 4.6–6.5)

## 2023-03-18 MED ORDER — EZETIMIBE 10 MG PO TABS: 10.0000 mg | ORAL_TABLET | Freq: Every day | ORAL | 3 refills | Status: DC

## 2023-03-18 NOTE — Progress Notes (Signed)
Subjective:    Patient ID: Kristen Strickland, female    DOB: Jun 12, 1946, 77 y.o.   MRN: [id number]  Patient here for  Chief Complaint  Patient presents with   Medical Management of Chronic Issues    HPI Here to follow up regarding hypercholesterolemia and hypertension.  Recently evaluated 01/28/23 - shoulder and knee pain. Saw ortho 02/13/23 - tendinitis of the right supraspinatus and OA of left knee. Recommended continuing meloxicam and tylenol.  S/p subacromial injection - right shoulder. Shoulder is some better. Legs are better since stopping crestor.  Discussed other treatment options.  She declines repatha.  Agreeable to a trial of zetia.  Doing PT. Recently saw podiatry for infected nail region of left great toe.  Per their note, doing better.  No chest pain or sob reported.  No cough or congestion.  Bowels stable. She did reports some memory issues.  Agreeable to referral to neurology.  Also reports noticing - easy bruising.     Past Medical History:  Diagnosis Date   Arthritis    Carotid arterial disease (HCC)    a. 11/2017 Carotid U/S: RICA 1-39%, LICA 40-59%.    Chronic fatigue    Chronic leg pain    DDD (degenerative disc disease), lumbar    Dyspnea on exertion    a. 02/2017 Echo: EF 60-65%, no rwma, mild MR. Nl RV size/fxn. Nl PASP; b. 02/2017 MV: small, mild, fixed basal inferolateral defect - ? artifact vs scar. No ischemia. EF >65%.   Hypercholesterolemia    Hypertension    Insomnia    Palpitations    a. 02/2017 Holter: Avg HR 66 (51-115), rare PACs, four brief runs of PAT - up to 5 beats, max rate 157. Rare isolated PVCs. No sustained arrhythmias; b. 03/2018 24h Holter: Rare PACs, 3 beat run PAT (122 bpm), PVCs (2% of beats).   Radiculopathy    Lumbar region   Sinus bradycardia    Spondylolisthesis    lumbosacral region   Past Surgical History:  Procedure Laterality Date   ABDOMINAL EXPOSURE N/A 11/10/2018   Procedure: ABDOMINAL EXPOSURE;  Surgeon: Chuck Hint,  MD;  Location: [facility] OR;  Service: Vascular;  Laterality: N/A;   ABDOMINAL HYSTERECTOMY  1975   ANTERIOR CERVICAL DECOMP/DISCECTOMY FUSION N/A 04/09/2013   Procedure: ANTERIOR CERVICAL DECOMPRESSION/DISCECTOMY FUSION 2 LEVELS;  Surgeon: Karn Cassis, MD;  Location: [facility];  Service: Neurosurgery;  Laterality: N/A;  Cervical five-six Cervical six-seven  Anterior cervical decompression/diskectomy/fusion   ANTERIOR LUMBAR FUSION N/A 11/10/2018   Procedure: Anterior Lumbar Interbody Fusion-Lumbar five-Sacral one;  Surgeon: Julio Sicks, MD;  Location: [facility] OR;  Service: Neurosurgery;  Laterality: N/A;   BACK SURGERY     ruptured disc   RECONSTRUCTION OF NOSE     Family History  Problem Relation Age of Onset   Cancer Mother        ovarian/uterine   Heart disease Mother    Heart attack Mother    Stroke Mother    Colon cancer Father    Dementia Father    COPD Brother    Congestive Heart Failure Brother    Stroke Daughter    Heart attack Daughter    Suicidality Brother    Breast cancer Neg Hx    Social History   Socioeconomic History   Marital status: Married    Spouse name: Not on file   Number of children: 2   Years of education: Not on file   Highest education level:  Not on file  Occupational History   Not on file  Tobacco Use   Smoking status: Never   Smokeless tobacco: Never  Vaping Use   Vaping Use: Never used  Substance and Sexual Activity   Alcohol use: Not Currently    Alcohol/week: 0.0 standard drinks of alcohol    Comment: rarely   Drug use: No   Sexual activity: Yes  Other Topics Concern   Not on file  Social History Narrative   ** Merged History Encounter **       Social Determinants of Health   Financial Resource Strain: Low Risk  (05/10/2022)   Overall Financial Resource Strain (CARDIA)    Difficulty of Paying Living Expenses: Not hard at all  Food Insecurity: No Food Insecurity (05/10/2022)   Hunger Vital Sign    Worried About Running Out of Food in  the Last Year: Never true    Ran Out of Food in the Last Year: Never true  Transportation Needs: No Transportation Needs (05/10/2022)   PRAPARE - Administrator, Civil Service (Medical): No    Lack of Transportation (Non-Medical): No  Physical Activity: Sufficiently Active (05/10/2022)   Exercise Vital Sign    Days of Exercise per Week: 5 days    Minutes of Exercise per Session: 30 min  Stress: No Stress Concern Present (05/10/2022)   [employer] of Occupational Health - Occupational Stress Questionnaire    Feeling of Stress : Not at all  Social Connections: Unknown (05/10/2022)   Social Connection and Isolation Panel [NHANES]    Frequency of Communication with Friends and Family: More than three times a week    Frequency of Social Gatherings with Friends and Family: More than three times a week    Attends Religious Services: Not on Marketing executive or Organizations: Not on file    Attends Banker Meetings: Not on file    Marital Status: Not on file     Review of Systems  Constitutional:  Negative for appetite change and unexpected weight change.  HENT:  Negative for congestion and sinus pressure.   Respiratory:  Negative for cough, chest tightness and shortness of breath.   Cardiovascular:  Negative for chest pain, palpitations and leg swelling.  Gastrointestinal:  Negative for abdominal pain, diarrhea, nausea and vomiting.  Genitourinary:  Negative for difficulty urinating and dysuria.  Musculoskeletal:  Negative for joint swelling.       Right shoulder pain - better.  Legs better off crestor.   Skin:  Negative for color change and rash.       Easy bruising.    Neurological:  Negative for dizziness and headaches.       Memory issues.    Psychiatric/Behavioral:  Negative for agitation and dysphoric mood.        Objective:     BP 128/70   Pulse 65   Temp 97.9 F (36.6 C)   Resp 16   Ht 5\' 4"  (1.626 m)   Wt 150 lb 3.2 oz (68.1  kg)   SpO2 99%   BMI 25.78 kg/m  Wt Readings from Last 3 Encounters:  03/18/23 150 lb 3.2 oz (68.1 kg)  01/28/23 149 lb 3.2 oz (67.7 kg)  11/13/22 151 lb (68.5 kg)    Physical Exam Vitals reviewed.  Constitutional:      General: She is not in acute distress.    Appearance: Normal appearance.  HENT:     Head:  Normocephalic and atraumatic.     Right Ear: External ear normal.     Left Ear: External ear normal.  Eyes:     General: No scleral icterus.       Right eye: No discharge.        Left eye: No discharge.     Conjunctiva/sclera: Conjunctivae normal.  Neck:     Thyroid: No thyromegaly.  Cardiovascular:     Rate and Rhythm: Normal rate and regular rhythm.  Pulmonary:     Effort: No respiratory distress.     Breath sounds: Normal breath sounds. No wheezing.  Abdominal:     General: Bowel sounds are normal.     Palpations: Abdomen is soft.     Tenderness: There is no abdominal tenderness.  Musculoskeletal:        General: No swelling or tenderness.     Cervical back: Neck supple. No tenderness.  Lymphadenopathy:     Cervical: No cervical adenopathy.  Skin:    Findings: No erythema or rash.  Neurological:     Mental Status: She is alert.  Psychiatric:        Mood and Affect: Mood normal.        Behavior: Behavior normal.      Outpatient Encounter Medications as of 03/18/2023  Medication Sig   ezetimibe (ZETIA) 10 MG tablet Take 1 tablet (10 mg total) by mouth daily.   amLODipine (NORVASC) 2.5 MG tablet Take 1 tablet (2.5 mg total) by mouth daily.   aspirin EC 81 MG tablet Take 81 mg by mouth daily.   gabapentin (NEURONTIN) 100 MG capsule TAKE (1) CAPSULE BY MOUTH ONCE DAILY.   losartan-hydrochlorothiazide (HYZAAR) 100-25 MG tablet Take 1 tablet by mouth daily.   meloxicam (MOBIC) 7.5 MG tablet Take 1 tablet by mouth daily as needed.   Multiple Vitamins-Minerals (CENTRUM ADULTS PO) Take 1 capsule by mouth 1 day or 1 dose.   pantoprazole (PROTONIX) 40 MG tablet  TAKE (1) TABLET BY MOUTH DAILY 1 HOUR BEFORE BREAKFAST.   [DISCONTINUED] rosuvastatin (CRESTOR) 20 MG tablet TAKE 1 TABLET BY MOUTH ONCE DAILY FOR CHOLESTEROL   No facility-administered encounter medications on file as of 03/18/2023.     Lab Results  Component Value Date   WBC 5.1 03/18/2023   HGB 12.4 03/18/2023   HCT 37.6 03/18/2023   PLT 254.0 03/18/2023   GLUCOSE 103 (H) 03/18/2023   CHOL 232 (H) 03/18/2023   TRIG 103.0 03/18/2023   HDL 58.30 03/18/2023   LDLDIRECT 151.7 08/16/2013   LDLCALC 154 (H) 03/18/2023   ALT 20 03/18/2023   AST 19 03/18/2023   NA 136 03/18/2023   K 3.7 03/18/2023   CL 101 03/18/2023   CREATININE 0.81 03/18/2023   BUN 18 03/18/2023   CO2 27 03/18/2023   TSH 3.79 08/08/2022   HGBA1C 5.9 03/18/2023    MM 3D SCREEN BREAST BILATERAL  Result Date: 01/15/2023 CLINICAL DATA:  Screening. EXAM: DIGITAL SCREENING BILATERAL MAMMOGRAM WITH TOMOSYNTHESIS AND CAD TECHNIQUE: Bilateral screening digital craniocaudal and mediolateral oblique mammograms were obtained. Bilateral screening digital breast tomosynthesis was performed. The images were evaluated with computer-aided detection. COMPARISON:  Previous exam(s). ACR Breast Density Category c: The breasts are heterogeneously dense, which may obscure small masses. FINDINGS: There are no findings suspicious for malignancy. IMPRESSION: No mammographic evidence of malignancy. A result letter of this screening mammogram will be mailed directly to the patient. RECOMMENDATION: Screening mammogram in one year. (Code:SM-B-01Y) BI-RADS CATEGORY  1: Negative. Electronically Signed  By: Beckie Salts M.D.   On: 01/15/2023 15:20       Assessment & Plan:  Primary hypertension Assessment & Plan: Blood pressure as outlined.  Continues on losartan/hctz and amlodipine.  Follow pressures.  Follow metabolic panel.   Orders: -     Basic metabolic panel  Hyperlipidemia LDL goal <70 Assessment & Plan: Has previously been on  crestor.  Had muscle aches.  Feels better off.  Discussed other treatment options. Discussed zetia and repatha.  Wants to hold on repatha.  Agreeable to zetia. Low cholesterol diet and exercise.  Follow lipid panel and liver function tests.    Orders: -     Hepatic function panel -     Lipid panel  Hyperglycemia Assessment & Plan: Low carb diet and exercise.  Follow met b and a1c.   Orders: -     Hemoglobin A1c  Easy bruising Assessment & Plan: Check cbc.   Orders: -     CBC with Differential/Platelet  Statin myopathy Assessment & Plan: Intolerant to crestor.  Leg aches.  Feels better off statin.    Aortic atherosclerosis (HCC) Assessment & Plan: Has been on crestor.  Had intolerance.  Statin myopathy.  Start zetia.    Bilateral leg pain Assessment & Plan: Better since stopping crestor.    Bilateral carotid artery disease, unspecified type [facility]) Assessment & Plan: Dr Wyn Quaker (06/28/22) -Carotid duplex today shows velocities in the 1 to 39% range on the right and 40 to 59% range on the left albeit the higher end of this range. Continue aspirin. Intolerant to crestor.  Start zetia. No role for intervention at this level.  Follow-up in 1 year with carotid duplex.    Chronic heart failure with preserved ejection fraction (HFpEF) (HCC) Assessment & Plan: Continues on losartan/hctz.  No evidence of volume overload on exam.  Follow.    Impingement syndrome, shoulder, right Assessment & Plan: S/p injection. Doing some better.  Continue f/u with ortho.    Stress Assessment & Plan: Overall appears to be handling things well. Follow.    Memory change Assessment & Plan: Concern regarding memory change.  Refer to neurology for formal evaluation.   Orders: -     Ambulatory referral to Neurology  Other orders -     Ezetimibe; Take 1 tablet (10 mg total) by mouth daily.  Dispense: 30 tablet; Refill: 3     Dale [place], MD

## 2023-03-19 DIAGNOSIS — M25511 Pain in right shoulder: Secondary | ICD-10-CM | POA: Diagnosis not present

## 2023-03-21 DIAGNOSIS — M25511 Pain in right shoulder: Secondary | ICD-10-CM | POA: Diagnosis not present

## 2023-03-23 ENCOUNTER — Encounter: Payer: Self-pay | Admitting: Internal Medicine

## 2023-03-23 DIAGNOSIS — R413 Other amnesia: Secondary | ICD-10-CM | POA: Insufficient documentation

## 2023-03-23 DIAGNOSIS — G720 Drug-induced myopathy: Secondary | ICD-10-CM | POA: Insufficient documentation

## 2023-03-23 NOTE — Assessment & Plan Note (Addendum)
Dr Wyn Quaker (06/28/22) -Carotid duplex today shows velocities in the 1 to 39% range on the right and 40 to 59% range on the left albeit the higher end of this range. Continue aspirin. Intolerant to crestor.  Start zetia. No role for intervention at this level.  Follow-up in 1 year with carotid duplex.

## 2023-03-23 NOTE — Assessment & Plan Note (Signed)
Intolerant to crestor.  Leg aches.  Feels better off statin.

## 2023-03-23 NOTE — Assessment & Plan Note (Signed)
Better since stopping crestor.

## 2023-03-23 NOTE — Assessment & Plan Note (Signed)
Check cbc 

## 2023-03-23 NOTE — Assessment & Plan Note (Signed)
Blood pressure as outlined.  Continues on losartan/hctz and amlodipine.  Follow pressures.  Follow metabolic panel.  

## 2023-03-23 NOTE — Assessment & Plan Note (Signed)
Has been on crestor.  Had intolerance.  Statin myopathy.  Start zetia.

## 2023-03-23 NOTE — Assessment & Plan Note (Signed)
Continues on losartan/hctz.  No evidence of volume overload on exam.  Follow.  

## 2023-03-23 NOTE — Assessment & Plan Note (Signed)
S/p injection. Doing some better.  Continue f/u with ortho.

## 2023-03-23 NOTE — Assessment & Plan Note (Signed)
Concern regarding memory change.  Refer to neurology for formal evaluation.

## 2023-03-23 NOTE — Assessment & Plan Note (Signed)
Low carb diet and exercise.  Follow met b and a1c.   

## 2023-03-23 NOTE — Assessment & Plan Note (Signed)
Overall appears to be handling things well.  Follow.  ?

## 2023-03-23 NOTE — Assessment & Plan Note (Signed)
Has previously been on crestor.  Had muscle aches.  Feels better off.  Discussed other treatment options. Discussed zetia and repatha.  Wants to hold on repatha.  Agreeable to zetia. Low cholesterol diet and exercise.  Follow lipid panel and liver function tests.

## 2023-03-24 ENCOUNTER — Telehealth: Payer: Self-pay | Admitting: Internal Medicine

## 2023-03-24 NOTE — Telephone Encounter (Signed)
Patient aware of results.

## 2023-03-24 NOTE — Telephone Encounter (Signed)
Pt is calling about her lab results °

## 2023-03-25 DIAGNOSIS — M25511 Pain in right shoulder: Secondary | ICD-10-CM | POA: Diagnosis not present

## 2023-03-27 DIAGNOSIS — M25511 Pain in right shoulder: Secondary | ICD-10-CM | POA: Diagnosis not present

## 2023-04-01 DIAGNOSIS — M25511 Pain in right shoulder: Secondary | ICD-10-CM | POA: Diagnosis not present

## 2023-04-03 DIAGNOSIS — M25511 Pain in right shoulder: Secondary | ICD-10-CM | POA: Diagnosis not present

## 2023-04-17 DIAGNOSIS — M25511 Pain in right shoulder: Secondary | ICD-10-CM | POA: Diagnosis not present

## 2023-04-21 ENCOUNTER — Other Ambulatory Visit: Payer: Self-pay | Admitting: Internal Medicine

## 2023-04-30 DIAGNOSIS — M25511 Pain in right shoulder: Secondary | ICD-10-CM | POA: Diagnosis not present

## 2023-05-14 ENCOUNTER — Ambulatory Visit: Payer: Medicare Other | Admitting: *Deleted

## 2023-05-14 VITALS — BP 112/72 | HR 69 | Ht 64.0 in | Wt 150.0 lb

## 2023-05-14 DIAGNOSIS — Z0000 Encounter for general adult medical examination without abnormal findings: Secondary | ICD-10-CM

## 2023-05-14 NOTE — Progress Notes (Signed)
Subjective:   OKIE JANSSON is a 77 y.o. female who presents for Medicare Annual (Subsequent) preventive examination.  Visit Complete: Virtual  I connected with  Kathleene Hazel on 05/14/23 by a audio enabled telemedicine application and verified that I am speaking with the correct person using two identifiers.  Patient Location: Home  Provider Location: Office/Clinic  I discussed the limitations of evaluation and management by telemedicine. The patient expressed understanding and agreed to proceed. I have confirmed that all information answered by patient is correct and no changes since this date.  Review of Systems     Cardiac Risk Factors include: advanced age (>37men, >31 women);hypertension;dyslipidemia;Other (see comment), Risk factor comments: Palpitaions, CAD, PAC     Objective:    Today's Vitals   05/14/23 1430  BP: 112/72  Pulse: 69  SpO2: 96%  Weight: 150 lb (68 kg)  Height: 5\' 4"  (1.626 m)   Body mass index is 25.75 kg/m.     05/14/2023    2:53 PM 05/10/2022    2:39 PM 02/10/2022    1:58 PM 04/18/2021    3:06 PM 04/03/2020    1:30 PM 10/13/2019   10:27 AM 09/14/2019    5:32 PM  Advanced Directives  Does Patient Have a Medical Advance Directive? No No No No No No No  Would patient like information on creating a medical advance directive?  No - Patient declined No - Patient declined No - Patient declined No - Patient declined      Current Medications (verified) Outpatient Encounter Medications as of 05/14/2023  Medication Sig   acetaminophen (TYLENOL) 650 MG CR tablet Take 650 mg by mouth every 8 (eight) hours as needed for pain.   amLODipine (NORVASC) 2.5 MG tablet Take 1 tablet (2.5 mg total) by mouth daily.   aspirin EC 81 MG tablet Take 81 mg by mouth daily.   ezetimibe (ZETIA) 10 MG tablet Take 1 tablet (10 mg total) by mouth daily.   gabapentin (NEURONTIN) 100 MG capsule TAKE ONE CAPSULE BY MOUTH EVERY DAY   losartan-hydrochlorothiazide (HYZAAR)  100-25 MG tablet Take 1 tablet by mouth daily.   meloxicam (MOBIC) 7.5 MG tablet Take 1 tablet by mouth daily as needed.   Multiple Vitamins-Minerals (CENTRUM ADULTS PO) Take 1 capsule by mouth 1 day or 1 dose.   pantoprazole (PROTONIX) 40 MG tablet TAKE (1) TABLET BY MOUTH DAILY 1 HOUR BEFORE BREAKFAST. (Patient not taking: Reported on 05/14/2023)   No facility-administered encounter medications on file as of 05/14/2023.    Allergies (verified) Patient has no known allergies.   History: Past Medical History:  Diagnosis Date   Arthritis    Carotid arterial disease (HCC)    a. 11/2017 Carotid U/S: RICA 1-39%, LICA 40-59%.    Chronic fatigue    Chronic leg pain    DDD (degenerative disc disease), lumbar    Dyspnea on exertion    a. 02/2017 Echo: EF 60-65%, no rwma, mild MR. Nl RV size/fxn. Nl PASP; b. 02/2017 MV: small, mild, fixed basal inferolateral defect - ? artifact vs scar. No ischemia. EF >65%.   Hypercholesterolemia    Hypertension    Insomnia    Palpitations    a. 02/2017 Holter: Avg HR 66 (51-115), rare PACs, four brief runs of PAT - up to 5 beats, max rate 157. Rare isolated PVCs. No sustained arrhythmias; b. 03/2018 24h Holter: Rare PACs, 3 beat run PAT (122 bpm), PVCs (2% of beats).   Radiculopathy  Lumbar region   Sinus bradycardia    Spondylolisthesis    lumbosacral region   Past Surgical History:  Procedure Laterality Date   ABDOMINAL EXPOSURE N/A 11/10/2018   Procedure: ABDOMINAL EXPOSURE;  Surgeon: Chuck Hint, MD;  Location: [facility] OR;  Service: Vascular;  Laterality: N/A;   ABDOMINAL HYSTERECTOMY  1975   ANTERIOR CERVICAL DECOMP/DISCECTOMY FUSION N/A 04/09/2013   Procedure: ANTERIOR CERVICAL DECOMPRESSION/DISCECTOMY FUSION 2 LEVELS;  Surgeon: Karn Cassis, MD;  Location: [facility];  Service: Neurosurgery;  Laterality: N/A;  Cervical five-six Cervical six-seven  Anterior cervical decompression/diskectomy/fusion   ANTERIOR LUMBAR FUSION N/A 11/10/2018    Procedure: Anterior Lumbar Interbody Fusion-Lumbar five-Sacral one;  Surgeon: Julio Sicks, MD;  Location: [facility] OR;  Service: Neurosurgery;  Laterality: N/A;   BACK SURGERY     ruptured disc   RECONSTRUCTION OF NOSE     Family History  Problem Relation Age of Onset   Cancer Mother        ovarian/uterine   Heart disease Mother    Heart attack Mother    Stroke Mother    Colon cancer Father    Dementia Father    COPD Brother    Congestive Heart Failure Brother    Stroke Daughter    Heart attack Daughter    Suicidality Brother    Breast cancer Neg Hx    Social History   Socioeconomic History   Marital status: Married    Spouse name: Not on file   Number of children: 2   Years of education: Not on file   Highest education level: Not on file  Occupational History   Not on file  Tobacco Use   Smoking status: Never   Smokeless tobacco: Never  Vaping Use   Vaping status: Never Used  Substance and Sexual Activity   Alcohol use: Not Currently    Alcohol/week: 0.0 standard drinks of alcohol    Comment: rarely   Drug use: No   Sexual activity: Yes  Other Topics Concern   Not on file  Social History Narrative   ** Merged History Encounter **    Married   Social Determinants of Health   Financial Resource Strain: Low Risk  (05/14/2023)   Overall Financial Resource Strain (CARDIA)    Difficulty of Paying Living Expenses: Not hard at all  Food Insecurity: No Food Insecurity (05/14/2023)   Hunger Vital Sign    Worried About Running Out of Food in the Last Year: Never true    Ran Out of Food in the Last Year: Never true  Transportation Needs: No Transportation Needs (05/14/2023)   PRAPARE - Administrator, Civil Service (Medical): No    Lack of Transportation (Non-Medical): No  Physical Activity: Inactive (05/14/2023)   Exercise Vital Sign    Days of Exercise per Week: 0 days    Minutes of Exercise per Session: 0 min  Stress: No Stress Concern Present (05/14/2023)    [employer] of Occupational Health - Occupational Stress Questionnaire    Feeling of Stress : Only a little  Social Connections: Moderately Integrated (05/14/2023)   Social Connection and Isolation Panel [NHANES]    Frequency of Communication with Friends and Family: More than three times a week    Frequency of Social Gatherings with Friends and Family: More than three times a week    Attends Religious Services: More than 4 times per year    Active Member of Clubs or Organizations: No    Attends  Banker Meetings: Never    Marital Status: Married    Tobacco Counseling Counseling given: Not Answered   Clinical Intake:  Pre-visit preparation completed: Yes  Pain : No/denies pain     BMI - recorded: 25.25 Nutritional Status: BMI 25 -29 Overweight Nutritional Risks: None Diabetes: No  How often do you need to have someone help you when you read instructions, pamphlets, or other written materials from your doctor or pharmacy?: 1 - Never  Interpreter Needed?: No  Information entered by :: R. Dayshon Roback LPN   Activities of Daily Living    05/14/2023    2:34 PM  In your present state of health, do you have any difficulty performing the following activities:  Hearing? 0  Vision? 0  Comment readers  Difficulty concentrating or making decisions? 1  Walking or climbing stairs? 1  Comment climbing stairs  Dressing or bathing? 0  Doing errands, shopping? 0  Preparing Food and eating ? N  Using the Toilet? N  In the past six months, have you accidently leaked urine? Y  Comment wears pads  Do you have problems with loss of bowel control? N  Managing your Medications? N  Managing your Finances? N  Housekeeping or managing your Housekeeping? N    Patient Care Team: Dale Toftrees, MD as PCP - General (Internal Medicine) End, Cristal Deer, MD as PCP - Cardiology (Cardiology) Julio Sicks, MD as Consulting Physician (Neurosurgery)  Indicate any recent Medical  Services you may have received from other than [facility] providers in the past year (date may be approximate).     Assessment:   This is a routine wellness examination for Naiyah.  Hearing/Vision screen Hearing Screening - Comments:: No issues Vision Screening - Comments:: readers  Dietary issues and exercise activities discussed:     Goals Addressed             This Visit's Progress    Patient Stated       Try to watch what she eats        Depression Screen    05/14/2023    2:42 PM 11/13/2022   10:38 AM 08/08/2022    8:12 AM 05/10/2022    2:38 PM 01/10/2022    6:55 AM 06/28/2021    8:23 AM 04/18/2021    3:03 PM  PHQ 2/9 Scores  PHQ - 2 Score 0 0 0 0 0 0 0  PHQ- 9 Score 3 2         Fall Risk    05/14/2023    2:37 PM 11/13/2022   10:38 AM 08/08/2022    8:12 AM 05/10/2022    2:40 PM 01/10/2022    6:55 AM  Fall Risk   Falls in the past year?  0 1 0 0  Number falls in past yr: 1 0 1    Injury with Fall? 0  0    Risk for fall due to : History of fall(s);Impaired balance/gait;Impaired mobility No Fall Risks History of fall(s)  No Fall Risks  Follow up Falls evaluation completed;Education provided;Falls prevention discussed Falls evaluation completed Falls evaluation completed Falls evaluation completed Falls evaluation completed    MEDICARE RISK AT HOME:  Medicare Risk at Home - 05/14/23 1439     Any stairs in or around the home? Yes    If so, are there any without handrails? Yes    Home free of loose throw rugs in walkways, pet beds, electrical cords, etc? Yes    Adequate lighting in  your home to reduce risk of falls? Yes    Life alert? No    Use of a cane, walker or w/c? No    Grab bars in the bathroom? Yes    Shower chair or bench in shower? Yes    Elevated toilet seat or a handicapped toilet? No              Cognitive Function:    03/20/2018    8:49 AM 03/19/2017    8:39 AM 03/12/2016   10:13 AM  MMSE - Mini Mental State Exam  Orientation to time 5 5 5    Orientation to Place 5 5 5   Registration 3 3 3   Attention/ Calculation 4 3 5   Attention/Calculation-comments  difficulty with simple calculation   Recall 1 1 2   Language- name 2 objects 2 2 2   Language- repeat 1 1 1   Language- follow 3 step command 3 3 3   Language- read & follow direction 1 1 1   Write a sentence 1 1 1   Copy design 1 1 1   Total score 27 26 29         05/14/2023    2:54 PM 05/10/2022    4:18 PM 04/18/2021    3:25 PM 04/03/2020    1:32 PM 03/25/2019    9:30 AM  6CIT Screen  What Year? 0 points 0 points 0 points 0 points 0 points  What month? 0 points 0 points 0 points 0 points 0 points  What time? 0 points 0 points 0 points  0 points  Count back from 20 0 points 0 points  0 points 0 points  Months in reverse 2 points 0 points 0 points 0 points 0 points  Repeat phrase  0 points  2 points   Total Score  0 points       Immunizations Immunization History  Administered Date(s) Administered   Moderna Sars-Covid-2 Vaccination 12/24/2019, 01/28/2020    TDAP status: Due, Education has been provided regarding the importance of this vaccine. Advised may receive this vaccine at local pharmacy or Health Dept. Aware to provide a copy of the vaccination record if obtained from local pharmacy or Health Dept. Verbalized acceptance and understanding.  Flu Vaccine status: Declined, Education has been provided regarding the importance of this vaccine but patient still declined. Advised may receive this vaccine at local pharmacy or Health Dept. Aware to provide a copy of the vaccination record if obtained from local pharmacy or Health Dept. Verbalized acceptance and understanding.  Pneumococcal vaccine status: Declined,  Education has been provided regarding the importance of this vaccine but patient still declined. Advised may receive this vaccine at local pharmacy or Health Dept. Aware to provide a copy of the vaccination record if obtained from local pharmacy or Health Dept.  Verbalized acceptance and understanding.   Covid-19 vaccine status: Information provided on how to obtain vaccines.   Qualifies for Shingles Vaccine? Yes   Zostavax completed No   Shingrix Completed?: No.    Education has been provided regarding the importance of this vaccine. Patient has been advised to call insurance company to determine out of pocket expense if they have not yet received this vaccine. Advised may also receive vaccine at local pharmacy or Health Dept. Verbalized acceptance and understanding.  Screening Tests Health Maintenance  Topic Date Due   DTaP/Tdap/Td (1 - Tdap) Never done   Medicare Annual Wellness (AWV)  05/11/2023   Pneumonia Vaccine 70+ Years old (1 of 1 - PCV) 11/14/2023 (Originally 04/24/2012)  INFLUENZA VACCINE  05/22/2023   DEXA SCAN  Completed   Hepatitis C Screening  Completed   HPV VACCINES  Aged Out   MAMMOGRAM  Discontinued   Colonoscopy  Discontinued   COVID-19 Vaccine  Discontinued   Zoster Vaccines- Shingrix  Discontinued    Health Maintenance  Health Maintenance Due  Topic Date Due   DTaP/Tdap/Td (1 - Tdap) Never done   Medicare Annual Wellness (AWV)  05/11/2023    Colorectal cancer screening: No longer required.   Mammogram status: Completed 5/24. Repeat every year  Bone Density status: Completed 7/19. Results reflect: Bone density results: NORMAL. Repeat every 2 years. Patient wants to talk with PCP to decide if she wants to have another one.  Lung Cancer Screening: (Low Dose CT Chest recommended if Age 52-80 years, 20 pack-year currently smoking OR have quit w/in 15years.) does not qualify.     Additional Screening:  Hepatitis C Screening: does qualify; Completed 6/18  Vision Screening: Recommended annual ophthalmology exams for early detection of glaucoma and other disorders of the eye. Is the patient up to date with their annual eye exam?  Yes  but cancelled last appointment . Patient will think about scheduling another  appointment Who is the provider or what is the name of the office in which the patient attends annual eye exams? [facility] not sure of name  If pt is not established with a provider, would they like to be referred to a provider to establish care? No .   Dental Screening: Recommended annual dental exams for proper oral hygiene    Community Resource Referral / Chronic Care Management: CRR required this visit?  No   CCM required this visit?  No     Plan:     I have personally reviewed and noted the following in the patient's chart:   Medical and social history Use of alcohol, tobacco or illicit drugs  Current medications and supplements including opioid prescriptions. Patient is not currently taking opioid prescriptions. Functional ability and status Nutritional status Physical activity Advanced directives List of other physicians Hospitalizations, surgeries, and ER visits in previous 12 months Vitals Screenings to include cognitive, depression, and falls Referrals and appointments  In addition, I have reviewed and discussed with patient certain preventive protocols, quality metrics, and best practice recommendations. A written personalized care plan for preventive services as well as general preventive health recommendations were provided to patient.     Sydell Axon, LPN   [id number]   After Visit Summary: (Declined) Due to this being a telephonic visit, with patients personalized plan was offered to patient but patient Declined AVS at this time   Nurse Notes: None

## 2023-05-14 NOTE — Patient Instructions (Signed)
This was a tele-visit and patient was able to provide vital signs which has been documents. Ms. Kristen Strickland , Thank you for taking time to come for your Medicare Wellness Visit. I appreciate your ongoing commitment to your health goals. Please review the following plan we discussed and let me know if I can assist you in the future.   These are the goals we discussed:  Goals       Follow up with Primary Care Provider (pt-stated)      As needed.      Patient Stated      Try to watch what she eats       Reduce sugar intake        This is a list of the screening recommended for you and due dates:  Health Maintenance  Topic Date Due   DTaP/Tdap/Td vaccine (1 - Tdap) Never done   Pneumonia Vaccine (1 of 1 - PCV) 11/14/2023*   Flu Shot  05/22/2023   Medicare Annual Wellness Visit  05/13/2024   DEXA scan (bone density measurement)  Completed   Hepatitis C Screening  Completed   HPV Vaccine  Aged Out   Mammogram  Discontinued   Colon Cancer Screening  Discontinued   COVID-19 Vaccine  Discontinued   Zoster (Shingles) Vaccine  Discontinued  *Topic was postponed. The date shown is not the original due date.    Advanced directives:Patient will work on   Conditions/risks identified:None  Next appointment: Follow up in one year for your annual wellness visit 05/18/24 @ 1:00   Preventive Care 65 Years and Older, Female Preventive care refers to lifestyle choices and visits with your health care provider that can promote health and wellness. What does preventive care include? A yearly physical exam. This is also called an annual well check. Dental exams once or twice a year. Routine eye exams. Ask your health care provider how often you should have your eyes checked. Personal lifestyle choices, including: Daily care of your teeth and gums. Regular physical activity. Eating a healthy diet. Avoiding tobacco and drug use. Limiting alcohol use. Practicing safe sex. Taking low-dose aspirin  every day. Taking vitamin and mineral supplements as recommended by your health care provider. What happens during an annual well check? The services and screenings done by your health care provider during your annual well check will depend on your age, overall health, lifestyle risk factors, and family history of disease. Counseling  Your health care provider may ask you questions about your: Alcohol use. Tobacco use. Drug use. Emotional well-being. Home and relationship well-being. Sexual activity. Eating habits. History of falls. Memory and ability to understand (cognition). Work and work Astronomer. Reproductive health. Screening  You may have the following tests or measurements: Height, weight, and BMI. Blood pressure. Lipid and cholesterol levels. These may be checked every 5 years, or more frequently if you are over 81 years old. Skin check. Lung cancer screening. You may have this screening every year starting at age 67 if you have a 30-pack-year history of smoking and currently smoke or have quit within the past 15 years. Fecal occult blood test (FOBT) of the stool. You may have this test every year starting at age 92. Flexible sigmoidoscopy or colonoscopy. You may have a sigmoidoscopy every 5 years or a colonoscopy every 10 years starting at age 7. Hepatitis C blood test. Hepatitis B blood test. Sexually transmitted disease (STD) testing. Diabetes screening. This is done by checking your blood sugar (glucose) after you have  not eaten for a while (fasting). You may have this done every 1-3 years. Bone density scan. This is done to screen for osteoporosis. You may have this done starting at age 42. Mammogram. This may be done every 1-2 years. Talk to your health care provider about how often you should have regular mammograms. Talk with your health care provider about your test results, treatment options, and if necessary, the need for more tests. Vaccines  Your health  care provider may recommend certain vaccines, such as: Influenza vaccine. This is recommended every year. Tetanus, diphtheria, and acellular pertussis (Tdap, Td) vaccine. You may need a Td booster every 10 years. Zoster vaccine. You may need this after age 68. Pneumococcal 13-valent conjugate (PCV13) vaccine. One dose is recommended after age 11. Pneumococcal polysaccharide (PPSV23) vaccine. One dose is recommended after age 23. Talk to your health care provider about which screenings and vaccines you need and how often you need them. This information is not intended to replace advice given to you by your health care provider. Make sure you discuss any questions you have with your health care provider. Document Released: 11/03/2015 Document Revised: 06/26/2016 Document Reviewed: 08/08/2015 Elsevier Interactive Patient Education  2017 [employer].  Fall Prevention in the Home Falls can cause injuries. They can happen to people of all ages. There are many things you can do to make your home safe and to help prevent falls. What can I do on the outside of my home? Regularly fix the edges of walkways and driveways and fix any cracks. Remove anything that might make you trip as you walk through a door, such as a raised step or threshold. Trim any bushes or trees on the path to your home. Use bright outdoor lighting. Clear any walking paths of anything that might make someone trip, such as rocks or tools. Regularly check to see if handrails are loose or broken. Make sure that both sides of any steps have handrails. Any raised decks and porches should have guardrails on the edges. Have any leaves, snow, or ice cleared regularly. Use sand or salt on walking paths during winter. Clean up any spills in your garage right away. This includes oil or grease spills. What can I do in the bathroom? Use night lights. Install grab bars by the toilet and in the tub and shower. Do not use towel bars as grab  bars. Use non-skid mats or decals in the tub or shower. If you need to sit down in the shower, use a plastic, non-slip stool. Keep the floor dry. Clean up any water that spills on the floor as soon as it happens. Remove soap buildup in the tub or shower regularly. Attach bath mats securely with double-sided non-slip rug tape. Do not have throw rugs and other things on the floor that can make you trip. What can I do in the bedroom? Use night lights. Make sure that you have a light by your bed that is easy to reach. Do not use any sheets or blankets that are too big for your bed. They should not hang down onto the floor. Have a firm chair that has side arms. You can use this for support while you get dressed. Do not have throw rugs and other things on the floor that can make you trip. What can I do in the kitchen? Clean up any spills right away. Avoid walking on wet floors. Keep items that you use a lot in easy-to-reach places. If you need to  reach something above you, use a strong step stool that has a grab bar. Keep electrical cords out of the way. Do not use floor polish or wax that makes floors slippery. If you must use wax, use non-skid floor wax. Do not have throw rugs and other things on the floor that can make you trip. What can I do with my stairs? Do not leave any items on the stairs. Make sure that there are handrails on both sides of the stairs and use them. Fix handrails that are broken or loose. Make sure that handrails are as long as the stairways. Check any carpeting to make sure that it is firmly attached to the stairs. Fix any carpet that is loose or worn. Avoid having throw rugs at the top or bottom of the stairs. If you do have throw rugs, attach them to the floor with carpet tape. Make sure that you have a light switch at the top of the stairs and the bottom of the stairs. If you do not have them, ask someone to add them for you. What else can I do to help prevent  falls? Wear shoes that: Do not have high heels. Have rubber bottoms. Are comfortable and fit you well. Are closed at the toe. Do not wear sandals. If you use a stepladder: Make sure that it is fully opened. Do not climb a closed stepladder. Make sure that both sides of the stepladder are locked into place. Ask someone to hold it for you, if possible. Clearly mark and make sure that you can see: Any grab bars or handrails. First and last steps. Where the edge of each step is. Use tools that help you move around (mobility aids) if they are needed. These include: Canes. Walkers. Scooters. Crutches. Turn on the lights when you go into a dark area. Replace any light bulbs as soon as they burn out. Set up your furniture so you have a clear path. Avoid moving your furniture around. If any of your floors are uneven, fix them. If there are any pets around you, be aware of where they are. Review your medicines with your doctor. Some medicines can make you feel dizzy. This can increase your chance of falling. Ask your doctor what other things that you can do to help prevent falls. This information is not intended to replace advice given to you by your health care provider. Make sure you discuss any questions you have with your health care provider. Document Released: 08/03/2009 Document Revised: 03/14/2016 Document Reviewed: 11/11/2014 Elsevier Interactive Patient Education  2017 [employer].

## 2023-05-15 DIAGNOSIS — M25511 Pain in right shoulder: Secondary | ICD-10-CM | POA: Diagnosis not present

## 2023-05-16 NOTE — Progress Notes (Signed)
Subjective:   Kristen Strickland is a 77 y.o. female who presents for Medicare Annual (Subsequent) preventive examination.  Visit Complete: Virtual  I connected with  Kristen Strickland on 05/16/23 by a audio enabled telemedicine application and verified that I am speaking with the correct person using two identifiers.  Patient Location: Home  Provider Location: Office/Clinic  I discussed the limitations of evaluation and management by telemedicine. The patient expressed understanding and agreed to proceed. I have confirmed that all information answered by patient is correct and no changes since this date.  Review of Systems    Vital Signs: Unable to obtain new vitals due to this being a telehealth visit.  Patient provided vital signs which have been documented.  Cardiac Risk Factors include: advanced age (>55men, >28 women);hypertension;dyslipidemia;Other (see comment), Risk factor comments: Palpitaions, CAD, PAC     Objective:    Today's Vitals   05/14/23 1430  BP: 112/72  Pulse: 69  SpO2: 96%  Weight: 150 lb (68 kg)  Height: 5\' 4"  (1.626 m)   Body mass index is 25.75 kg/m.     05/14/2023    2:53 PM 05/10/2022    2:39 PM 02/10/2022    1:58 PM 04/18/2021    3:06 PM 04/03/2020    1:30 PM 10/13/2019   10:27 AM 09/14/2019    5:32 PM  Advanced Directives  Does Patient Have a Medical Advance Directive? No No No No No No No  Would patient like information on creating a medical advance directive?  No - Patient declined No - Patient declined No - Patient declined No - Patient declined      Current Medications (verified) Outpatient Encounter Medications as of 05/14/2023  Medication Sig   acetaminophen (TYLENOL) 650 MG CR tablet Take 650 mg by mouth every 8 (eight) hours as needed for pain.   amLODipine (NORVASC) 2.5 MG tablet Take 1 tablet (2.5 mg total) by mouth daily.   aspirin EC 81 MG tablet Take 81 mg by mouth daily.   ezetimibe (ZETIA) 10 MG tablet Take 1 tablet (10 mg  total) by mouth daily.   gabapentin (NEURONTIN) 100 MG capsule TAKE ONE CAPSULE BY MOUTH EVERY DAY   losartan-hydrochlorothiazide (HYZAAR) 100-25 MG tablet Take 1 tablet by mouth daily.   meloxicam (MOBIC) 7.5 MG tablet Take 1 tablet by mouth daily as needed.   Multiple Vitamins-Minerals (CENTRUM ADULTS PO) Take 1 capsule by mouth 1 day or 1 dose.   pantoprazole (PROTONIX) 40 MG tablet TAKE (1) TABLET BY MOUTH DAILY 1 HOUR BEFORE BREAKFAST. (Patient not taking: Reported on 05/14/2023)   No facility-administered encounter medications on file as of 05/14/2023.    Allergies (verified) Patient has no known allergies.   History: Past Medical History:  Diagnosis Date   Arthritis    Carotid arterial disease (HCC)    a. 11/2017 Carotid U/S: RICA 1-39%, LICA 40-59%.    Chronic fatigue    Chronic leg pain    DDD (degenerative disc disease), lumbar    Dyspnea on exertion    a. 02/2017 Echo: EF 60-65%, no rwma, mild MR. Nl RV size/fxn. Nl PASP; b. 02/2017 MV: small, mild, fixed basal inferolateral defect - ? artifact vs scar. No ischemia. EF >65%.   Hypercholesterolemia    Hypertension    Insomnia    Palpitations    a. 02/2017 Holter: Avg HR 66 (51-115), rare PACs, four brief runs of PAT - up to 5 beats, max rate 157. Rare isolated PVCs. No  sustained arrhythmias; b. 03/2018 24h Holter: Rare PACs, 3 beat run PAT (122 bpm), PVCs (2% of beats).   Radiculopathy    Lumbar region   Sinus bradycardia    Spondylolisthesis    lumbosacral region   Past Surgical History:  Procedure Laterality Date   ABDOMINAL EXPOSURE N/A 11/10/2018   Procedure: ABDOMINAL EXPOSURE;  Surgeon: Chuck Hint, MD;  Location: [facility];  Service: Vascular;  Laterality: N/A;   ABDOMINAL HYSTERECTOMY  1975   ANTERIOR CERVICAL DECOMP/DISCECTOMY FUSION N/A 04/09/2013   Procedure: ANTERIOR CERVICAL DECOMPRESSION/DISCECTOMY FUSION 2 LEVELS;  Surgeon: Karn Cassis, MD;  Location: [facility] OR[facility];  Service: Neurosurgery;   Laterality: N/A;  Cervical five-six Cervical six-seven  Anterior cervical decompression/diskectomy/fusion   ANTERIOR LUMBAR FUSION N/A 11/10/2018   Procedure: Anterior Lumbar Interbody Fusion-Lumbar five-Sacral one;  Surgeon: Julio Sicks, MD;  Location: [facility] OR;  Service: Neurosurgery;  Laterality: N/A;   BACK SURGERY     ruptured disc   RECONSTRUCTION OF NOSE     Family History  Problem Relation Age of Onset   Cancer Mother        ovarian/uterine   Heart disease Mother    Heart attack Mother    Stroke Mother    Colon cancer Father    Dementia Father    COPD Brother    Congestive Heart Failure Brother    Stroke Daughter    Heart attack Daughter    Suicidality Brother    Breast cancer Neg Hx    Social History   Socioeconomic History   Marital status: Married    Spouse name: Not on file   Number of children: 2   Years of education: Not on file   Highest education level: Not on file  Occupational History   Not on file  Tobacco Use   Smoking status: Never   Smokeless tobacco: Never  Vaping Use   Vaping status: Never Used  Substance and Sexual Activity   Alcohol use: Not Currently    Alcohol/week: 0.0 standard drinks of alcohol    Comment: rarely   Drug use: No   Sexual activity: Yes  Other Topics Concern   Not on file  Social History Narrative   ** Merged History Encounter **    Married   Social Determinants of Health   Financial Resource Strain: Low Risk  (05/14/2023)   Overall Financial Resource Strain (CARDIA)    Difficulty of Paying Living Expenses: Not hard at all  Food Insecurity: No Food Insecurity (05/14/2023)   Hunger Vital Sign    Worried About Running Out of Food in the Last Year: Never true    Ran Out of Food in the Last Year: Never true  Transportation Needs: No Transportation Needs (05/14/2023)   PRAPARE - Administrator, Civil Service (Medical): No    Lack of Transportation (Non-Medical): No  Physical Activity: Inactive (05/14/2023)    Exercise Vital Sign    Days of Exercise per Week: 0 days    Minutes of Exercise per Session: 0 min  Stress: No Stress Concern Present (05/14/2023)   [employer] of Occupational Health - Occupational Stress Questionnaire    Feeling of Stress : Only a little  Social Connections: Moderately Integrated (05/14/2023)   Social Connection and Isolation Panel [NHANES]    Frequency of Communication with Friends and Family: More than three times a week    Frequency of Social Gatherings with Friends and Family: More than three times a week  Attends Religious Services: More than 4 times per year    Active Member of Clubs or Organizations: No    Attends Banker Meetings: Never    Marital Status: Married    Tobacco Counseling Counseling given: Not Answered   Clinical Intake:  Pre-visit preparation completed: Yes  Pain : No/denies pain     BMI - recorded: 25.25 Nutritional Status: BMI 25 -29 Overweight Nutritional Risks: None Diabetes: No  How often do you need to have someone help you when you read instructions, pamphlets, or other written materials from your doctor or pharmacy?: 1 - Never  Interpreter Needed?: No  Information entered by :: R. Zenith Lamphier LPN   Activities of Daily Living    05/14/2023    2:34 PM  In your present state of health, do you have any difficulty performing the following activities:  Hearing? 0  Vision? 0  Comment readers  Difficulty concentrating or making decisions? 1  Walking or climbing stairs? 1  Comment climbing stairs  Dressing or bathing? 0  Doing errands, shopping? 0  Preparing Food and eating ? N  Using the Toilet? N  In the past six months, have you accidently leaked urine? Y  Comment wears pads  Do you have problems with loss of bowel control? N  Managing your Medications? N  Managing your Finances? N  Housekeeping or managing your Housekeeping? N    Patient Care Team: Dale [place], MD as PCP - General (Internal  Medicine) End, Cristal Deer, MD as PCP - Cardiology (Cardiology) Julio Sicks, MD as Consulting Physician (Neurosurgery)  Indicate any recent Medical Services you may have received from other than [facility] providers in the past year (date may be approximate).     Assessment:   This is a routine wellness examination for Kristen Strickland.  Hearing/Vision screen Hearing Screening - Comments:: No issues Vision Screening - Comments:: readers  Dietary issues and exercise activities discussed:     Goals Addressed             This Visit's Progress    Patient Stated       Try to watch what she eats        Depression Screen    05/14/2023    2:42 PM 11/13/2022   10:38 AM 08/08/2022    8:12 AM 05/10/2022    2:38 PM 01/10/2022    6:55 AM 06/28/2021    8:23 AM 04/18/2021    3:03 PM  PHQ 2/9 Scores  PHQ - 2 Score 0 0 0 0 0 0 0  PHQ- 9 Score 3 2         Fall Risk    05/14/2023    2:37 PM 11/13/2022   10:38 AM 08/08/2022    8:12 AM 05/10/2022    2:40 PM 01/10/2022    6:55 AM  Fall Risk   Falls in the past year?  0 1 0 0  Number falls in past yr: 1 0 1    Injury with Fall? 0  0    Risk for fall due to : History of fall(s);Impaired balance/gait;Impaired mobility No Fall Risks History of fall(s)  No Fall Risks  Follow up Falls evaluation completed;Education provided;Falls prevention discussed Falls evaluation completed Falls evaluation completed Falls evaluation completed Falls evaluation completed    MEDICARE RISK AT HOME:     Cognitive Function:    03/20/2018    8:49 AM 03/19/2017    8:39 AM 03/12/2016   10:13 AM  MMSE - Mini Mental  State Exam  Orientation to time 5 5 5   Orientation to Place 5 5 5   Registration 3 3 3   Attention/ Calculation 4 3 5   Attention/Calculation-comments  difficulty with simple calculation   Recall 1 1 2   Language- name 2 objects 2 2 2   Language- repeat 1 1 1   Language- follow 3 step command 3 3 3   Language- read & follow direction 1 1 1   Write a sentence 1 1  1   Copy design 1 1 1   Total score 27 26 29         05/14/2023    2:54 PM 05/10/2022    4:18 PM 04/18/2021    3:25 PM 04/03/2020    1:32 PM 03/25/2019    9:30 AM  6CIT Screen  What Year? 0 points 0 points 0 points 0 points 0 points  What month? 0 points 0 points 0 points 0 points 0 points  What time? 0 points 0 points 0 points  0 points  Count back from 20 0 points 0 points  0 points 0 points  Months in reverse 2 points 0 points 0 points 0 points 0 points  Repeat phrase  0 points  2 points   Total Score  0 points       Immunizations Immunization History  Administered Date(s) Administered   Moderna Sars-Covid-2 Vaccination 12/24/2019, 01/28/2020    TDAP status: Due, Education has been provided regarding the importance of this vaccine. Advised may receive this vaccine at local pharmacy or Health Dept. Aware to provide a copy of the vaccination record if obtained from local pharmacy or Health Dept. Verbalized acceptance and understanding.  Flu Vaccine status: Declined, Education has been provided regarding the importance of this vaccine but patient still declined. Advised may receive this vaccine at local pharmacy or Health Dept. Aware to provide a copy of the vaccination record if obtained from local pharmacy or Health Dept. Verbalized acceptance and understanding.  Pneumococcal vaccine status: Declined,  Education has been provided regarding the importance of this vaccine but patient still declined. Advised may receive this vaccine at local pharmacy or Health Dept. Aware to provide a copy of the vaccination record if obtained from local pharmacy or Health Dept. Verbalized acceptance and understanding.   Covid-19 vaccine status: Information provided on how to obtain vaccines.   Qualifies for Shingles Vaccine? Yes   Zostavax completed No   Shingrix Completed?: No.    Education has been provided regarding the importance of this vaccine. Patient has been advised to call insurance company to  determine out of pocket expense if they have not yet received this vaccine. Advised may also receive vaccine at local pharmacy or Health Dept. Verbalized acceptance and understanding.  Screening Tests Health Maintenance  Topic Date Due   DTaP/Tdap/Td (1 - Tdap) Never done   Pneumonia Vaccine 66+ Years old (1 of 1 - PCV) 11/14/2023 (Originally 04/24/2012)   INFLUENZA VACCINE  05/22/2023   Medicare Annual Wellness (AWV)  05/13/2024   DEXA SCAN  Completed   Hepatitis C Screening  Completed   HPV VACCINES  Aged Out   MAMMOGRAM  Discontinued   Colonoscopy  Discontinued   COVID-19 Vaccine  Discontinued   Zoster Vaccines- Shingrix  Discontinued    Health Maintenance  Health Maintenance Due  Topic Date Due   DTaP/Tdap/Td (1 - Tdap) Never done    Colorectal cancer screening: No longer required.   Mammogram status: Completed 5/24. Repeat every year  Bone Density status: Completed 7/19. Results  reflect: Bone density results: NORMAL. Repeat every 2 years. Patient wants to talk with PCP to decide if she wants to have another one.  Lung Cancer Screening: (Low Dose CT Chest recommended if Age 62-80 years, 20 pack-year currently smoking OR have quit w/in 15years.) does not qualify.     Additional Screening:  Hepatitis C Screening: does qualify; Completed 6/18  Vision Screening: Recommended annual ophthalmology exams for early detection of glaucoma and other disorders of the eye. Is the patient up to date with their annual eye exam?  Yes  but cancelled last appointment . Patient will think about scheduling another appointment Who is the provider or what is the name of the office in which the patient attends annual eye exams? [facility] not sure of name  If pt is not established with a provider, would they like to be referred to a provider to establish care? No .   Dental Screening: Recommended annual dental exams for proper oral hygiene    Community Resource Referral / Chronic Care  Management: CRR required this visit?  No   CCM required this visit?  No     Plan:     I have personally reviewed and noted the following in the patient's chart:   Medical and social history Use of alcohol, tobacco or illicit drugs  Current medications and supplements including opioid prescriptions. Patient is not currently taking opioid prescriptions. Functional ability and status Nutritional status Physical activity Advanced directives List of other physicians Hospitalizations, surgeries, and ER visits in previous 12 months Vitals Screenings to include cognitive, depression, and falls Referrals and appointments  In addition, I have reviewed and discussed with patient certain preventive protocols, quality metrics, and best practice recommendations. A written personalized care plan for preventive services as well as general preventive health recommendations were provided to patient.     Sydell Axon, LPN   [id number]/[id number]   After Visit Summary: (Declined) Due to this being a telephonic visit, with patients personalized plan was offered to patient but patient Declined AVS at this time   Nurse Notes: None

## 2023-05-29 ENCOUNTER — Telehealth: Payer: Self-pay | Admitting: Internal Medicine

## 2023-05-29 NOTE — Telephone Encounter (Signed)
Patient takes PRN. Has not previously been filled by you. Ok to refill?

## 2023-05-29 NOTE — Telephone Encounter (Signed)
Prescription Request  05/29/2023  LOV: 03/18/2023  What is the name of the medication or equipment? meloxicam   Have you contacted your pharmacy to request a refill? No   Which pharmacy would you like this sent to?  [facility] - [place], [place] - [address] [place] [address] Phone: [phone] Fax: [phone]    Patient notified that their request is being sent to the clinical staff for review and that they should receive a response within 2 business days.   Please advise at home

## 2023-05-29 NOTE — Telephone Encounter (Signed)
Please call and see how often she is taking meloxicam.  I am ok to refill, but I want her to minimize the amount of meloxicam taking.

## 2023-05-30 NOTE — Telephone Encounter (Signed)
LMTCB

## 2023-06-02 MED ORDER — MELOXICAM 7.5 MG PO TABS: 7.5000 mg | ORAL_TABLET | Freq: Every day | ORAL | 0 refills | Status: DC | PRN

## 2023-06-02 NOTE — Telephone Encounter (Signed)
FYI-  She is not taking it everyday. Sometimes she uses every other day but sometimes she does not use it that often. She is aware that she needs to use sparingly and does use tylenol.

## 2023-06-02 NOTE — Addendum Note (Signed)
Addended by: Charm Barges on: 06/02/2023 07:38 PM   Modules accepted: Orders

## 2023-06-02 NOTE — Telephone Encounter (Signed)
Rx for meloxicam sent in to pharmacy.

## 2023-06-12 DIAGNOSIS — G3184 Mild cognitive impairment, so stated: Secondary | ICD-10-CM | POA: Diagnosis not present

## 2023-06-12 DIAGNOSIS — H9193 Unspecified hearing loss, bilateral: Secondary | ICD-10-CM | POA: Diagnosis not present

## 2023-06-12 DIAGNOSIS — I10 Essential (primary) hypertension: Secondary | ICD-10-CM | POA: Diagnosis not present

## 2023-06-18 ENCOUNTER — Other Ambulatory Visit: Payer: Self-pay | Admitting: Neurology

## 2023-06-18 DIAGNOSIS — G3184 Mild cognitive impairment, so stated: Secondary | ICD-10-CM

## 2023-06-19 ENCOUNTER — Encounter: Payer: Self-pay | Admitting: Internal Medicine

## 2023-06-19 DIAGNOSIS — R200 Anesthesia of skin: Secondary | ICD-10-CM | POA: Insufficient documentation

## 2023-06-19 DIAGNOSIS — H9190 Unspecified hearing loss, unspecified ear: Secondary | ICD-10-CM | POA: Insufficient documentation

## 2023-06-20 ENCOUNTER — Ambulatory Visit
Admission: RE | Admit: 2023-06-20 | Discharge: 2023-06-20 | Disposition: A | Discharge status: Home / Self Care | Payer: Medicare Other | Source: Ambulatory Visit | Attending: Neurology | Admitting: Neurology

## 2023-06-20 DIAGNOSIS — G3184 Mild cognitive impairment, so stated: Secondary | ICD-10-CM | POA: Diagnosis not present

## 2023-06-20 DIAGNOSIS — G309 Alzheimer's disease, unspecified: Secondary | ICD-10-CM | POA: Diagnosis not present

## 2023-06-20 DIAGNOSIS — F0280 Dementia in other diseases classified elsewhere without behavioral disturbance: Secondary | ICD-10-CM | POA: Diagnosis not present

## 2023-06-27 ENCOUNTER — Other Ambulatory Visit: Payer: Self-pay | Admitting: Internal Medicine

## 2023-06-30 ENCOUNTER — Other Ambulatory Visit (INDEPENDENT_AMBULATORY_CARE_PROVIDER_SITE_OTHER): Payer: Self-pay | Admitting: Vascular Surgery

## 2023-06-30 DIAGNOSIS — I6523 Occlusion and stenosis of bilateral carotid arteries: Secondary | ICD-10-CM

## 2023-07-01 ENCOUNTER — Ambulatory Visit (INDEPENDENT_AMBULATORY_CARE_PROVIDER_SITE_OTHER): Payer: Medicare Other

## 2023-07-01 ENCOUNTER — Encounter (INDEPENDENT_AMBULATORY_CARE_PROVIDER_SITE_OTHER): Payer: Self-pay | Admitting: Vascular Surgery

## 2023-07-01 ENCOUNTER — Ambulatory Visit (INDEPENDENT_AMBULATORY_CARE_PROVIDER_SITE_OTHER): Payer: Medicare Other | Admitting: Vascular Surgery

## 2023-07-01 VITALS — BP 133/72 | HR 61 | Resp 16 | Wt 147.4 lb

## 2023-07-01 DIAGNOSIS — E785 Hyperlipidemia, unspecified: Secondary | ICD-10-CM

## 2023-07-01 DIAGNOSIS — I6523 Occlusion and stenosis of bilateral carotid arteries: Secondary | ICD-10-CM

## 2023-07-01 DIAGNOSIS — I10 Essential (primary) hypertension: Secondary | ICD-10-CM | POA: Diagnosis not present

## 2023-07-01 NOTE — Assessment & Plan Note (Signed)
lipid control important in reducing the progression of atherosclerotic disease.   

## 2023-07-01 NOTE — Progress Notes (Signed)
MRN : [id number]  Kristen Strickland is a 77 y.o. (1946-05-10) female who presents with chief complaint of  Chief Complaint  Patient presents with   Follow-up    Ultrasound follow up  .  History of Present Illness: Patient returns in follow-up of her carotid disease.  She is doing well from a cerebrovascular standpoint without any focal neurologic symptoms. Specifically, the patient denies amaurosis fugax, speech or swallowing difficulties, or arm or leg weakness or numbness.Carotid duplex today demonstrates 1 to 39% right ICA stenosis and 40 to 59% left ICA stenosis without progression from previous study.   Current Outpatient Medications  Medication Sig Dispense Refill   acetaminophen (TYLENOL) 650 MG CR tablet Take 650 mg by mouth every 8 (eight) hours as needed for pain.     amLODipine (NORVASC) 2.5 MG tablet Take 1 tablet (2.5 mg total) by mouth daily. 90 tablet 2   aspirin EC 81 MG tablet Take 81 mg by mouth daily.     ezetimibe (ZETIA) 10 MG tablet Take 1 tablet (10 mg total) by mouth daily. 30 tablet 3   gabapentin (NEURONTIN) 100 MG capsule TAKE ONE CAPSULE BY MOUTH EVERY DAY 90 capsule 1   losartan-hydrochlorothiazide (HYZAAR) 100-25 MG tablet TAKE ONE TABLET BY MOUTH EVERY DAY 90 tablet 1   meloxicam (MOBIC) 7.5 MG tablet Take 1 tablet (7.5 mg total) by mouth daily as needed. 30 tablet 0   Multiple Vitamins-Minerals (CENTRUM ADULTS PO) Take 1 capsule by mouth 1 day or 1 dose.     pantoprazole (PROTONIX) 40 MG tablet TAKE (1) TABLET BY MOUTH DAILY 1 HOUR BEFORE BREAKFAST. (Patient not taking: Reported on 05/14/2023) 90 tablet 2   No current facility-administered medications for this visit.    Past Medical History:  Diagnosis Date   Arthritis    Carotid arterial disease (HCC)    a. 11/2017 Carotid U/S: RICA 1-39%, LICA 40-59%.    Chronic fatigue    Chronic leg pain    DDD (degenerative disc disease), lumbar    Dyspnea on exertion    a. 02/2017 Echo: EF 60-65%, no rwma, mild  MR. Nl RV size/fxn. Nl PASP; b. 02/2017 MV: small, mild, fixed basal inferolateral defect - ? artifact vs scar. No ischemia. EF >65%.   Hypercholesterolemia    Hypertension    Insomnia    Palpitations    a. 02/2017 Holter: Avg HR 66 (51-115), rare PACs, four brief runs of PAT - up to 5 beats, max rate 157. Rare isolated PVCs. No sustained arrhythmias; b. 03/2018 24h Holter: Rare PACs, 3 beat run PAT (122 bpm), PVCs (2% of beats).   Radiculopathy    Lumbar region   Sinus bradycardia    Spondylolisthesis    lumbosacral region    Past Surgical History:  Procedure Laterality Date   ABDOMINAL EXPOSURE N/A 11/10/2018   Procedure: ABDOMINAL EXPOSURE;  Surgeon: Chuck Hint, MD;  Location: [facility] OR;  Service: Vascular;  Laterality: N/A;   ABDOMINAL HYSTERECTOMY  1975   ANTERIOR CERVICAL DECOMP/DISCECTOMY FUSION N/A 04/09/2013   Procedure: ANTERIOR CERVICAL DECOMPRESSION/DISCECTOMY FUSION 2 LEVELS;  Surgeon: Karn Cassis, MD;  Location: [facility];  Service: Neurosurgery;  Laterality: N/A;  Cervical five-six Cervical six-seven  Anterior cervical decompression/diskectomy/fusion   ANTERIOR LUMBAR FUSION N/A 11/10/2018   Procedure: Anterior Lumbar Interbody Fusion-Lumbar five-Sacral one;  Surgeon: Julio Sicks, MD;  Location: [facility] OR;  Service: Neurosurgery;  Laterality: N/A;   BACK SURGERY     ruptured disc  RECONSTRUCTION OF NOSE       Social History   Tobacco Use   Smoking status: Never   Smokeless tobacco: Never  Vaping Use   Vaping status: Never Used  Substance Use Topics   Alcohol use: Not Currently    Alcohol/week: 0.0 standard drinks of alcohol    Comment: rarely   Drug use: No       Family History  Problem Relation Age of Onset   Cancer Mother        ovarian/uterine   Heart disease Mother    Heart attack Mother    Stroke Mother    Colon cancer Father    Dementia Father    COPD Brother    Congestive Heart Failure Brother    Stroke Daughter    Heart attack  Daughter    Suicidality Brother    Breast cancer Neg Hx      No Known Allergies  REVIEW OF SYSTEMS (Negative unless checked)   Constitutional: [] Weight loss  [] Fever  [] Chills Cardiac: [] Chest pain   [] Chest pressure   [x] Palpitations   [] Shortness of breath when laying flat   [] Shortness of breath at rest   [x] Shortness of breath with exertion. Vascular:  [] Pain in legs with walking   [] Pain in legs at rest   [] Pain in legs when laying flat   [] Claudication   [] Pain in feet when walking  [] Pain in feet at rest  [] Pain in feet when laying flat   [] History of DVT   [] Phlebitis   [] Swelling in legs   [] Varicose veins   [] Non-healing ulcers Pulmonary:   [] Uses home oxygen   [] Productive cough   [] Hemoptysis   [] Wheeze  [] COPD   [] Asthma Neurologic:  [] Dizziness  [] Blackouts   [] Seizures   [] History of stroke   [] History of TIA  [] Aphasia   [] Temporary blindness   [] Dysphagia   [] Weakness or numbness in arms   [] Weakness or numbness in legs Musculoskeletal:  [x] Arthritis   [] Joint swelling   [] Joint pain   [x] Low back pain Hematologic:  [] Easy bruising  [] Easy bleeding   [] Hypercoagulable state   [] Anemic  [] Hepatitis Gastrointestinal:  [] Blood in stool   [] Vomiting blood  [] Gastroesophageal reflux/heartburn   [] Difficulty swallowing. Genitourinary:  [] Chronic kidney disease   [] Difficult urination  [] Frequent urination  [] Burning with urination   [] Blood in urine Skin:  [] Rashes   [] Ulcers   [] Wounds Psychological:  [] History of anxiety   []  History of major depression.  Physical Examination  Vitals:   07/01/23 1008  BP: 133/72  Pulse: 61  Resp: 16  Weight: 147 lb 6.4 oz (66.9 kg)   Body mass index is 25.3 kg/m. Gen:  WD/WN, NAD Head: [place]/AT, No temporalis wasting. Ear/Nose/Throat: Hearing grossly intact, nares w/o erythema or drainage, trachea midline Eyes: Conjunctiva clear. Sclera non-icteric Neck: Supple.  Soft left bruit  Pulmonary:  Good air movement, equal and clear to  auscultation bilaterally.  Cardiac: RRR, No JVD Vascular:  Vessel Right Left  Radial Palpable Palpable           Musculoskeletal: M/S 5/5 throughout.  No deformity or atrophy. No edema. Neurologic: CN 2-12 intact. Sensation grossly intact in extremities.  Symmetrical.  Speech is fluent. Motor exam as listed above. Psychiatric: Judgment intact, Mood & affect appropriate for pt's clinical situation. Dermatologic: No rashes or ulcers noted.  No cellulitis or open wounds.    CBC Lab Results  Component Value Date   WBC 5.1 03/18/2023   HGB  12.4 03/18/2023   HCT 37.6 03/18/2023   MCV 90.8 03/18/2023   PLT 254.0 03/18/2023    BMET    Component Value Date/Time   NA 136 03/18/2023 0949   NA 140 07/26/2021 1001   K 3.7 03/18/2023 0949   CL 101 03/18/2023 0949   CO2 27 03/18/2023 0949   GLUCOSE 103 (H) 03/18/2023 0949   BUN 18 03/18/2023 0949   BUN 19 07/26/2021 1001   CREATININE 0.81 03/18/2023 0949   CREATININE 0.87 05/11/2021 1423   CALCIUM 9.1 03/18/2023 0949   GFRNONAA >60 07/17/2022 1104   GFRAA 105 08/13/2019 0935   CrCl cannot be calculated (Patient's most recent lab result is older than the maximum 21 days allowed.).  COAG No results found for: "INR", "PROTIME"  Radiology No results found.   Assessment/Plan Bilateral carotid artery stenosis Carotid duplex today demonstrates stable 1 to 39% ICA stenosis on the right and 40 to 59% left ICA stenosis. No role for intervention at this time.  Continue aspiring therapy. Recheck in one year.  Hypertension blood pressure control important in reducing the progression of atherosclerotic disease. On appropriate oral medications.   Hyperlipidemia LDL goal <70 lipid control important in reducing the progression of atherosclerotic disease.     Festus Barren, MD  07/01/2023 11:27 AM    This note was created with Dragon medical transcription system.  Any errors from dictation are purely unintentional

## 2023-07-01 NOTE — Assessment & Plan Note (Signed)
Carotid duplex today demonstrates stable 1 to 39% ICA stenosis on the right and 40 to 59% left ICA stenosis. No role for intervention at this time.  Continue aspiring therapy. Recheck in one year.

## 2023-07-01 NOTE — Assessment & Plan Note (Signed)
blood pressure control important in reducing the progression of atherosclerotic disease. On appropriate oral medications.  

## 2023-07-06 ENCOUNTER — Other Ambulatory Visit: Payer: Self-pay | Admitting: Internal Medicine

## 2023-07-08 ENCOUNTER — Telehealth: Payer: Self-pay | Admitting: Internal Medicine

## 2023-07-08 DIAGNOSIS — E785 Hyperlipidemia, unspecified: Secondary | ICD-10-CM

## 2023-07-08 DIAGNOSIS — R739 Hyperglycemia, unspecified: Secondary | ICD-10-CM

## 2023-07-08 NOTE — Telephone Encounter (Signed)
Patient need lab orders.

## 2023-07-09 NOTE — Telephone Encounter (Signed)
Labs ordered.

## 2023-07-10 ENCOUNTER — Other Ambulatory Visit: Payer: Self-pay | Admitting: Internal Medicine

## 2023-07-15 DIAGNOSIS — H905 Unspecified sensorineural hearing loss: Secondary | ICD-10-CM | POA: Diagnosis not present

## 2023-07-17 ENCOUNTER — Other Ambulatory Visit (INDEPENDENT_AMBULATORY_CARE_PROVIDER_SITE_OTHER): Payer: Medicare Other

## 2023-07-17 DIAGNOSIS — R739 Hyperglycemia, unspecified: Secondary | ICD-10-CM

## 2023-07-17 DIAGNOSIS — E785 Hyperlipidemia, unspecified: Secondary | ICD-10-CM

## 2023-07-17 LAB — BASIC METABOLIC PANEL
BUN: 17 mg/dL (ref 6–23)
CO2: 29 mEq/L (ref 19–32)
Calcium: 9.4 mg/dL (ref 8.4–10.5)
Chloride: 104 mEq/L (ref 96–112)
Creatinine, Ser: 0.79 mg/dL (ref 0.40–1.20)
GFR: 72.76 mL/min (ref >60.00)
Glucose, Bld: 100 mg/dL — ABNORMAL HIGH (ref 70–99)
Potassium: 4 mEq/L (ref 3.5–5.1)
Sodium: 139 mEq/L (ref 135–145)

## 2023-07-17 LAB — LIPID PANEL
Cholesterol: 198 mg/dL (ref 0–200)
HDL: 56 mg/dL (ref >39.00)
LDL Cholesterol: 114 mg/dL — ABNORMAL HIGH (ref 0–99)
NonHDL: 141.95
Total CHOL/HDL Ratio: 4
Triglycerides: 142 mg/dL (ref 0.0–149.0)
VLDL: 28.4 mg/dL (ref 0.0–40.0)

## 2023-07-17 LAB — HEPATIC FUNCTION PANEL
ALT: 18 U/L (ref 0–35)
AST: 18 U/L (ref 0–37)
Albumin: 4.1 g/dL (ref 3.5–5.2)
Alkaline Phosphatase: 64 U/L (ref 39–117)
Bilirubin, Direct: 0.1 mg/dL (ref 0.0–0.3)
Total Bilirubin: 0.7 mg/dL (ref 0.2–1.2)
Total Protein: 6.7 g/dL (ref 6.0–8.3)

## 2023-07-17 LAB — HEMOGLOBIN A1C: Hgb A1c MFr Bld: 5.9 % (ref 4.6–6.5)

## 2023-07-21 ENCOUNTER — Ambulatory Visit: Payer: Medicare Other | Admitting: Internal Medicine

## 2023-07-22 ENCOUNTER — Telehealth: Payer: Self-pay | Admitting: Internal Medicine

## 2023-07-22 ENCOUNTER — Encounter: Payer: Self-pay | Admitting: Internal Medicine

## 2023-07-22 ENCOUNTER — Ambulatory Visit (INDEPENDENT_AMBULATORY_CARE_PROVIDER_SITE_OTHER): Payer: Medicare Other | Admitting: Internal Medicine

## 2023-07-22 VITALS — BP 128/72 | HR 69 | Temp 97.8°F | Resp 16 | Ht 64.0 in | Wt 149.4 lb

## 2023-07-22 DIAGNOSIS — F439 Reaction to severe stress, unspecified: Secondary | ICD-10-CM

## 2023-07-22 DIAGNOSIS — E785 Hyperlipidemia, unspecified: Secondary | ICD-10-CM

## 2023-07-22 DIAGNOSIS — R200 Anesthesia of skin: Secondary | ICD-10-CM | POA: Diagnosis not present

## 2023-07-22 DIAGNOSIS — G720 Drug-induced myopathy: Secondary | ICD-10-CM | POA: Diagnosis not present

## 2023-07-22 DIAGNOSIS — R739 Hyperglycemia, unspecified: Secondary | ICD-10-CM | POA: Diagnosis not present

## 2023-07-22 DIAGNOSIS — L989 Disorder of the skin and subcutaneous tissue, unspecified: Secondary | ICD-10-CM

## 2023-07-22 DIAGNOSIS — D649 Anemia, unspecified: Secondary | ICD-10-CM | POA: Diagnosis not present

## 2023-07-22 DIAGNOSIS — I779 Disorder of arteries and arterioles, unspecified: Secondary | ICD-10-CM

## 2023-07-22 DIAGNOSIS — R252 Cramp and spasm: Secondary | ICD-10-CM

## 2023-07-22 DIAGNOSIS — T466X5A Adverse effect of antihyperlipidemic and antiarteriosclerotic drugs, initial encounter: Secondary | ICD-10-CM

## 2023-07-22 DIAGNOSIS — R202 Paresthesia of skin: Secondary | ICD-10-CM | POA: Diagnosis not present

## 2023-07-22 DIAGNOSIS — I700 Atherosclerosis of aorta: Secondary | ICD-10-CM

## 2023-07-22 DIAGNOSIS — R413 Other amnesia: Secondary | ICD-10-CM

## 2023-07-22 DIAGNOSIS — M25511 Pain in right shoulder: Secondary | ICD-10-CM | POA: Diagnosis not present

## 2023-07-22 DIAGNOSIS — I10 Essential (primary) hypertension: Secondary | ICD-10-CM

## 2023-07-22 DIAGNOSIS — M19011 Primary osteoarthritis, right shoulder: Secondary | ICD-10-CM | POA: Diagnosis not present

## 2023-07-22 DIAGNOSIS — I5032 Chronic diastolic (congestive) heart failure: Secondary | ICD-10-CM | POA: Diagnosis not present

## 2023-07-22 MED ORDER — GABAPENTIN 100 MG PO CAPS: ORAL_CAPSULE | ORAL | 1 refills | Status: DC

## 2023-07-22 MED ORDER — EZETIMIBE 10 MG PO TABS: 10.0000 mg | ORAL_TABLET | Freq: Every day | ORAL | 3 refills | Status: DC

## 2023-07-22 NOTE — Progress Notes (Signed)
Subjective:    Patient ID: Kristen Strickland, female    DOB: 1946/02/22, 77 y.o.   MRN: [id number]  Patient here for  Chief Complaint  Patient presents with   Medical Management of Chronic Issues    HPI Here to follow up regarding hypercholesterolemia and hypertension.  Recently evaluated 01/28/23 - shoulder and knee pain. Saw ortho 02/13/23 - tendinitis of the right supraspinatus and OA of left knee. Recommended continuing meloxicam and tylenol.  S/p subacromial injection - right shoulder. Had f/u with Dr Wyn Quaker 07/01/23 - f/u carotids - Carotid duplex demonstrated 1 to 39% right ICA stenosis and 40 to 59% left ICA stenosis without progression from previous study. Recommended continuing aspirin therapy and recheck in one year. Evaluated by neurology 06/12/23 - mild cognitive impairment.  Recommend to start on oral B12. MRI brain - negative. Also recommended alpha lipoic acid - numbness/tingling lower extremity. Reports cramps at night - left > right. Lesion - right posterior lower leg.  Discussed dermatology referral.     Past Medical History:  Diagnosis Date   Arthritis    Carotid arterial disease (HCC)    a. 11/2017 Carotid U/S: RICA 1-39%, LICA 40-59%.    Chronic fatigue    Chronic leg pain    DDD (degenerative disc disease), lumbar    Dyspnea on exertion    a. 02/2017 Echo: EF 60-65%, no rwma, mild MR. Nl RV size/fxn. Nl PASP; b. 02/2017 MV: small, mild, fixed basal inferolateral defect - ? artifact vs scar. No ischemia. EF >65%.   Hypercholesterolemia    Hypertension    Insomnia    Palpitations    a. 02/2017 Holter: Avg HR 66 (51-115), rare PACs, four brief runs of PAT - up to 5 beats, max rate 157. Rare isolated PVCs. No sustained arrhythmias; b. 03/2018 24h Holter: Rare PACs, 3 beat run PAT (122 bpm), PVCs (2% of beats).   Radiculopathy    Lumbar region   Sinus bradycardia    Spondylolisthesis    lumbosacral region   Past Surgical History:  Procedure Laterality Date   ABDOMINAL  EXPOSURE N/A 11/10/2018   Procedure: ABDOMINAL EXPOSURE;  Surgeon: Chuck Hint, MD;  Location: [facility] OR;  Service: Vascular;  Laterality: N/A;   ABDOMINAL HYSTERECTOMY  1975   ANTERIOR CERVICAL DECOMP/DISCECTOMY FUSION N/A 04/09/2013   Procedure: ANTERIOR CERVICAL DECOMPRESSION/DISCECTOMY FUSION 2 LEVELS;  Surgeon: Karn Cassis, MD;  Location: [facility];  Service: Neurosurgery;  Laterality: N/A;  Cervical five-six Cervical six-seven  Anterior cervical decompression/diskectomy/fusion   ANTERIOR LUMBAR FUSION N/A 11/10/2018   Procedure: Anterior Lumbar Interbody Fusion-Lumbar five-Sacral one;  Surgeon: Julio Sicks, MD;  Location: [facility] OR;  Service: Neurosurgery;  Laterality: N/A;   BACK SURGERY     ruptured disc   RECONSTRUCTION OF NOSE     Family History  Problem Relation Age of Onset   Cancer Mother        ovarian/uterine   Heart disease Mother    Heart attack Mother    Stroke Mother    Colon cancer Father    Dementia Father    COPD Brother    Congestive Heart Failure Brother    Stroke Daughter    Heart attack Daughter    Suicidality Brother    Breast cancer Neg Hx    Social History   Socioeconomic History   Marital status: Married    Spouse name: Not on file   Number of children: 2   Years of education: Not on  file   Highest education level: Not on file  Occupational History   Not on file  Tobacco Use   Smoking status: Never   Smokeless tobacco: Never  Vaping Use   Vaping status: Never Used  Substance and Sexual Activity   Alcohol use: Not Currently    Alcohol/week: 0.0 standard drinks of alcohol    Comment: rarely   Drug use: No   Sexual activity: Yes  Other Topics Concern   Not on file  Social History Narrative   ** Merged History Encounter **    Married   Social Determinants of Health   Financial Resource Strain: Low Risk  (05/14/2023)   Overall Financial Resource Strain (CARDIA)    Difficulty of Paying Living Expenses: Not hard at all  Food  Insecurity: No Food Insecurity (05/14/2023)   Hunger Vital Sign    Worried About Running Out of Food in the Last Year: Never true    Ran Out of Food in the Last Year: Never true  Transportation Needs: No Transportation Needs (05/14/2023)   PRAPARE - Administrator, Civil Service (Medical): No    Lack of Transportation (Non-Medical): No  Physical Activity: Inactive (05/14/2023)   Exercise Vital Sign    Days of Exercise per Week: 0 days    Minutes of Exercise per Session: 0 min  Stress: No Stress Concern Present (05/14/2023)   [employer] of Occupational Health - Occupational Stress Questionnaire    Feeling of Stress : Only a little  Social Connections: Moderately Integrated (05/14/2023)   Social Connection and Isolation Panel [NHANES]    Frequency of Communication with Friends and Family: More than three times a week    Frequency of Social Gatherings with Friends and Family: More than three times a week    Attends Religious Services: More than 4 times per year    Active Member of [employer] or Organizations: No    Attends Banker Meetings: Never    Marital Status: Married     Review of Systems  Constitutional:  Negative for appetite change and unexpected weight change.  HENT:  Negative for congestion and sinus pressure.   Respiratory:  Negative for cough, chest tightness and shortness of breath.   Cardiovascular:  Negative for chest pain and palpitations.  Gastrointestinal:  Negative for abdominal pain, diarrhea, nausea and vomiting.  Genitourinary:  Negative for difficulty urinating and dysuria.  Musculoskeletal:  Negative for joint swelling and myalgias.  Skin:  Negative for color change and rash.  Neurological:  Negative for dizziness and headaches.  Psychiatric/Behavioral:  Negative for agitation and dysphoric mood.        Objective:     BP 128/72   Pulse 69   Temp 97.8 F (36.6 C)   Resp 16   Ht 5\' 4"  (1.626 m)   Wt 149 lb 6.4 oz (67.8 kg)    SpO2 98%   BMI 25.64 kg/m  Wt Readings from Last 3 Encounters:  07/22/23 149 lb 6.4 oz (67.8 kg)  07/01/23 147 lb 6.4 oz (66.9 kg)  06/20/23 151 lb (68.5 kg)    Physical Exam Vitals reviewed.  Constitutional:      General: She is not in acute distress.    Appearance: Normal appearance.  HENT:     Head: Normocephalic and atraumatic.     Right Ear: External ear normal.     Left Ear: External ear normal.  Eyes:     General: No scleral icterus.  Right eye: No discharge.        Left eye: No discharge.     Conjunctiva/sclera: Conjunctivae normal.  Neck:     Thyroid: No thyromegaly.  Cardiovascular:     Rate and Rhythm: Normal rate and regular rhythm.  Pulmonary:     Effort: No respiratory distress.     Breath sounds: Normal breath sounds. No wheezing.  Abdominal:     General: Bowel sounds are normal.     Palpations: Abdomen is soft.     Tenderness: There is no abdominal tenderness.  Musculoskeletal:        General: No swelling or tenderness.     Cervical back: Neck supple. No tenderness.  Lymphadenopathy:     Cervical: No cervical adenopathy.  Skin:    Findings: No erythema or rash.  Neurological:     Mental Status: She is alert.  Psychiatric:        Mood and Affect: Mood normal.        Behavior: Behavior normal.      Outpatient Encounter Medications as of 07/22/2023  Medication Sig   acetaminophen (TYLENOL) 650 MG CR tablet Take 650 mg by mouth every 8 (eight) hours as needed for pain.   amLODipine (NORVASC) 2.5 MG tablet TAKE ONE TABLET BY MOUTH ONCE DAILY   aspirin EC 81 MG tablet Take 81 mg by mouth daily.   ezetimibe (ZETIA) 10 MG tablet Take 1 tablet (10 mg total) by mouth daily.   gabapentin (NEURONTIN) 100 MG capsule TAKE ONE CAPSULE BY MOUTH EVERY DAY   losartan-hydrochlorothiazide (HYZAAR) 100-25 MG tablet TAKE ONE TABLET BY MOUTH EVERY DAY   meloxicam (MOBIC) 7.5 MG tablet Take 1 tablet (7.5 mg total) by mouth daily as needed.   Multiple  Vitamins-Minerals (CENTRUM ADULTS PO) Take 1 capsule by mouth 1 day or 1 dose.   [DISCONTINUED] ezetimibe (ZETIA) 10 MG tablet TAKE ONE TABLET BY MOUTH EVERY DAY   [DISCONTINUED] gabapentin (NEURONTIN) 100 MG capsule TAKE ONE CAPSULE BY MOUTH EVERY DAY   [DISCONTINUED] pantoprazole (PROTONIX) 40 MG tablet TAKE (1) TABLET BY MOUTH DAILY 1 HOUR BEFORE BREAKFAST. (Patient not taking: Reported on 05/14/2023)   No facility-administered encounter medications on file as of 07/22/2023.     Lab Results  Component Value Date   WBC 5.1 03/18/2023   HGB 12.4 03/18/2023   HCT 37.6 03/18/2023   PLT 254.0 03/18/2023   GLUCOSE 100 (H) 07/17/2023   CHOL 198 07/17/2023   TRIG 142.0 07/17/2023   HDL 56.00 07/17/2023   LDLDIRECT 151.7 08/16/2013   LDLCALC 114 (H) 07/17/2023   ALT 18 07/17/2023   AST 18 07/17/2023   NA 139 07/17/2023   K 4.0 07/17/2023   CL 104 07/17/2023   CREATININE 0.79 07/17/2023   BUN 17 07/17/2023   CO2 29 07/17/2023   TSH 3.79 08/08/2022   HGBA1C 5.9 07/17/2023    MR BRAIN WO CONTRAST  Result Date: 07/07/2023 CLINICAL DATA:  Memory history of dementia and Alzheimer's. Short-term memory loss EXAM: MRI HEAD WITHOUT CONTRAST TECHNIQUE: Multiplanar, multiecho pulse sequences of the brain and surrounding structures were obtained without intravenous contrast. COMPARISON:  None Available. FINDINGS: Brain: No acute infarction, hemorrhage, hydrocephalus, extra-axial collection or mass lesion. Normal brain volume and white matter appearance. No abnormal mineralization. Vascular: Major flow voids are preserved Skull and upper cervical spine: Normal marrow signal. Sinuses/Orbits: No contributory/significant finding IMPRESSION: Negative brain MRI.  No brain atrophy. Electronically Signed   By: Audry Riles.D.  On: 07/07/2023 07:17       Assessment & Plan:  Hyperlipidemia LDL goal <70 Assessment & Plan: Has previously been on crestor.  Had muscle aches.  Feels better off.  Discussed  other treatment options. Have discussed zetia and repatha.  Had wanted to hold on repatha.  On zetia. Low cholesterol diet and exercise.  Follow lipid panel and liver function tests.    Orders: -     Lipid panel; Future -     Hepatic function panel; Future -     Basic metabolic panel; Future  Hyperglycemia Assessment & Plan: Low carb diet and exercise.  Follow met b and a1c.   Orders: -     Hemoglobin A1c; Future  Aortic atherosclerosis (HCC) Assessment & Plan: Has been on crestor.  Had intolerance.  Statin myopathy.  On zetia.    Anemia, unspecified type Assessment & Plan: Follow cbc.    Bilateral carotid artery disease, unspecified type [facility]) Assessment & Plan: Had f/u with Dr Wyn Quaker 07/01/23 - f/u carotids - Carotid duplex demonstrated 1 to 39% right ICA stenosis and 40 to 59% left ICA stenosis without progression from previous study. Recommended continuing aspirin therapy and recheck in one year.   Chronic heart failure with preserved ejection fraction (HFpEF) (HCC) Assessment & Plan: Continues on losartan/hctz.  No evidence of volume overload on exam.  Follow.    Primary hypertension Assessment & Plan: Blood pressure as outlined.  Continues on losartan/hctz and amlodipine.  Follow pressures.  Follow metabolic panel.    Memory change Assessment & Plan: Evaluated by neurology 06/12/23 - mild cognitive impairment.  Recommend to start on oral B12. MRI brain - negative.   Numbness and tingling of both lower extremities Assessment & Plan: Saw neurology -  recommended alpha lipoic acid - numbness/tingling lower extremity.    Statin myopathy Assessment & Plan: Intolerant to crestor.  Leg aches.  Feels better off statin.    Stress Assessment & Plan: Overall appears to be handling things well. Follow.    Cramps of lower extremity Assessment & Plan: Stay hydrated.  Stretches.    Other orders -     Ezetimibe; Take 1 tablet (10 mg total) by mouth daily.  Dispense:  90 tablet; Refill: 3 -     Gabapentin; TAKE ONE CAPSULE BY MOUTH EVERY DAY  Dispense: 90 capsule; Refill: 1     Dale Bamberg, MD

## 2023-07-22 NOTE — Assessment & Plan Note (Addendum)
Has previously been on crestor.  Had muscle aches.  Feels better off.  Discussed other treatment options. Have discussed zetia and repatha.  Had wanted to hold on repatha.  On zetia. Low cholesterol diet and exercise.  Follow lipid panel and liver function tests.

## 2023-07-22 NOTE — Telephone Encounter (Signed)
Pt called stating she was giving a phone number to the cma to make an appointment for her for a dermatologist. Pt did not know the name of the place or the doctor Phone number to the dermatologist-[phone]

## 2023-07-23 NOTE — Telephone Encounter (Signed)
Ok to refer to dermatology of her choice - persistent skin lesion.

## 2023-07-23 NOTE — Telephone Encounter (Signed)
Derm referral placed

## 2023-07-23 NOTE — Telephone Encounter (Signed)
Do you want me to place referral for her to [facility]? The number listed below is their office. I am assuming she discussed this with you during her visit yesterday .

## 2023-07-26 ENCOUNTER — Encounter: Payer: Self-pay | Admitting: Internal Medicine

## 2023-07-26 DIAGNOSIS — R252 Cramp and spasm: Secondary | ICD-10-CM | POA: Insufficient documentation

## 2023-07-26 NOTE — Assessment & Plan Note (Signed)
Stay hydrated.  Stretches.

## 2023-07-26 NOTE — Assessment & Plan Note (Signed)
Evaluated by neurology 06/12/23 - mild cognitive impairment.  Recommend to start on oral B12. MRI brain - negative.

## 2023-07-26 NOTE — Assessment & Plan Note (Signed)
Blood pressure as outlined.  Continues on losartan/hctz and amlodipine.  Follow pressures.  Follow metabolic panel.  

## 2023-07-26 NOTE — Assessment & Plan Note (Signed)
Has been on crestor.  Had intolerance.  Statin myopathy.  On zetia.

## 2023-07-26 NOTE — Assessment & Plan Note (Signed)
Had f/u with Dr Wyn Quaker 07/01/23 - f/u carotids - Carotid duplex demonstrated 1 to 39% right ICA stenosis and 40 to 59% left ICA stenosis without progression from previous study. Recommended continuing aspirin therapy and recheck in one year.

## 2023-07-26 NOTE — Assessment & Plan Note (Signed)
Low carb diet and exercise.  Follow met b and a1c.   

## 2023-07-26 NOTE — Assessment & Plan Note (Signed)
Overall appears to be handling things well.  Follow.  ?

## 2023-07-26 NOTE — Assessment & Plan Note (Signed)
Continues on losartan/hctz.  No evidence of volume overload on exam.  Follow.

## 2023-07-26 NOTE — Assessment & Plan Note (Signed)
Saw neurology -  recommended alpha lipoic acid - numbness/tingling lower extremity.

## 2023-07-26 NOTE — Assessment & Plan Note (Signed)
Follow cbc.  

## 2023-07-26 NOTE — Assessment & Plan Note (Signed)
Intolerant to crestor.  Leg aches.  Feels better off statin.

## 2023-07-30 DIAGNOSIS — L570 Actinic keratosis: Secondary | ICD-10-CM | POA: Diagnosis not present

## 2023-07-30 DIAGNOSIS — Z85828 Personal history of other malignant neoplasm of skin: Secondary | ICD-10-CM | POA: Diagnosis not present

## 2023-07-30 DIAGNOSIS — L820 Inflamed seborrheic keratosis: Secondary | ICD-10-CM | POA: Diagnosis not present

## 2023-07-30 DIAGNOSIS — D225 Melanocytic nevi of trunk: Secondary | ICD-10-CM | POA: Diagnosis not present

## 2023-07-30 DIAGNOSIS — D045 Carcinoma in situ of skin of trunk: Secondary | ICD-10-CM | POA: Diagnosis not present

## 2023-07-30 DIAGNOSIS — C44722 Squamous cell carcinoma of skin of right lower limb, including hip: Secondary | ICD-10-CM | POA: Diagnosis not present

## 2023-07-30 DIAGNOSIS — D485 Neoplasm of uncertain behavior of skin: Secondary | ICD-10-CM | POA: Diagnosis not present

## 2023-08-12 DIAGNOSIS — Z85828 Personal history of other malignant neoplasm of skin: Secondary | ICD-10-CM | POA: Diagnosis not present

## 2023-08-12 DIAGNOSIS — D045 Carcinoma in situ of skin of trunk: Secondary | ICD-10-CM | POA: Diagnosis not present

## 2023-10-28 DIAGNOSIS — M25511 Pain in right shoulder: Secondary | ICD-10-CM | POA: Diagnosis not present

## 2023-10-28 DIAGNOSIS — M19011 Primary osteoarthritis, right shoulder: Secondary | ICD-10-CM | POA: Diagnosis not present

## 2023-10-28 DIAGNOSIS — M7591 Shoulder lesion, unspecified, right shoulder: Secondary | ICD-10-CM | POA: Diagnosis not present

## 2023-11-05 ENCOUNTER — Other Ambulatory Visit: Payer: Self-pay | Admitting: Student

## 2023-11-05 DIAGNOSIS — M25511 Pain in right shoulder: Secondary | ICD-10-CM

## 2023-11-05 DIAGNOSIS — M7591 Shoulder lesion, unspecified, right shoulder: Secondary | ICD-10-CM

## 2023-11-05 DIAGNOSIS — M19011 Primary osteoarthritis, right shoulder: Secondary | ICD-10-CM

## 2023-11-18 DIAGNOSIS — F0670 Mild neurocognitive disorder due to known physiological condition without behavioral disturbance: Secondary | ICD-10-CM | POA: Diagnosis not present

## 2023-11-18 DIAGNOSIS — R202 Paresthesia of skin: Secondary | ICD-10-CM | POA: Diagnosis not present

## 2023-11-18 DIAGNOSIS — R200 Anesthesia of skin: Secondary | ICD-10-CM | POA: Diagnosis not present

## 2023-11-18 DIAGNOSIS — G309 Alzheimer's disease, unspecified: Secondary | ICD-10-CM | POA: Diagnosis not present

## 2023-11-19 ENCOUNTER — Ambulatory Visit
Admission: RE | Admit: 2023-11-19 | Discharge: 2023-11-19 | Disposition: A | Discharge status: Home / Self Care | Payer: Medicare Other | Source: Ambulatory Visit | Attending: Student

## 2023-11-19 DIAGNOSIS — M25511 Pain in right shoulder: Secondary | ICD-10-CM | POA: Diagnosis not present

## 2023-11-19 DIAGNOSIS — M19011 Primary osteoarthritis, right shoulder: Secondary | ICD-10-CM

## 2023-11-19 DIAGNOSIS — M75121 Complete rotator cuff tear or rupture of right shoulder, not specified as traumatic: Secondary | ICD-10-CM | POA: Diagnosis not present

## 2023-11-19 DIAGNOSIS — M7591 Shoulder lesion, unspecified, right shoulder: Secondary | ICD-10-CM

## 2023-11-19 DIAGNOSIS — M7551 Bursitis of right shoulder: Secondary | ICD-10-CM | POA: Diagnosis not present

## 2023-11-26 ENCOUNTER — Ambulatory Visit: Payer: Medicare Other | Admitting: Internal Medicine

## 2023-12-22 ENCOUNTER — Telehealth: Payer: Self-pay

## 2023-12-22 DIAGNOSIS — M7581 Other shoulder lesions, right shoulder: Secondary | ICD-10-CM | POA: Diagnosis not present

## 2023-12-22 DIAGNOSIS — M7541 Impingement syndrome of right shoulder: Secondary | ICD-10-CM | POA: Diagnosis not present

## 2023-12-22 DIAGNOSIS — M7521 Bicipital tendinitis, right shoulder: Secondary | ICD-10-CM | POA: Diagnosis not present

## 2023-12-22 DIAGNOSIS — M75121 Complete rotator cuff tear or rupture of right shoulder, not specified as traumatic: Secondary | ICD-10-CM | POA: Diagnosis not present

## 2023-12-22 NOTE — Telephone Encounter (Signed)
 Sounds good to me. He has seen multiple of my patients.

## 2023-12-22 NOTE — Telephone Encounter (Signed)
See me about this.

## 2023-12-22 NOTE — Telephone Encounter (Signed)
 Copied from CRM [id number]. Topic: General - Other >> Dec 22, 2023  3:32 PM Kathryne Eriksson wrote: Reason for CRM: Message For Dr. Lorin Picket >> Dec 22, 2023  3:36 PM Kathryne Eriksson wrote: Patient states she will be having surgery on her shoulder and would like Dr.Scott's opinion on the surgeon performing the procedure. That surgeon name is Poggi, Thalia Bloodgood, MD at the [facility]. Patient states it's fine to leave detailed message on answering machine.

## 2023-12-23 NOTE — Telephone Encounter (Signed)
 Patient is aware

## 2023-12-25 ENCOUNTER — Telehealth: Payer: Self-pay

## 2023-12-25 DIAGNOSIS — Z1231 Encounter for screening mammogram for malignant neoplasm of breast: Secondary | ICD-10-CM

## 2023-12-25 NOTE — Telephone Encounter (Signed)
 Mammo ordered. Pt is aware.

## 2023-12-25 NOTE — Addendum Note (Signed)
 Addended by: Rita Ohara D on: 12/25/2023 11:31 AM   Modules accepted: Orders

## 2023-12-25 NOTE — Telephone Encounter (Signed)
 Copied from CRM [id number]. Topic: General - Other >> Dec 25, 2023 11:12 AM Elizebeth Brooking wrote: Reason for CRM: Patient called in wanting to know the name and number of the clicn she will be going to to get her mammogram, she is requesting a callback on this matter

## 2023-12-31 ENCOUNTER — Other Ambulatory Visit: Payer: Self-pay | Admitting: Internal Medicine

## 2023-12-31 MED ORDER — LOSARTAN POTASSIUM-HCTZ 100-25 MG PO TABS: 1.0000 | ORAL_TABLET | Freq: Every day | ORAL | 1 refills | Status: DC

## 2023-12-31 MED ORDER — PANTOPRAZOLE SODIUM 40 MG PO TBEC: 40.0000 mg | DELAYED_RELEASE_TABLET | Freq: Every day | ORAL | 1 refills | Status: DC

## 2023-12-31 NOTE — Telephone Encounter (Signed)
 Copied from CRM [id number]. Topic: Clinical - Medication Refill >> Dec 31, 2023 11:22 AM Adele Barthel wrote: Most Recent Primary Care Visit:  Provider: Dale Mantachie  Department: [facility]  Visit Type: OFFICE VISIT  Date: 07/22/2023  Medication: losartan-hydrochlorothiazide (HYZAAR) 100-25 MG tablet   Has the patient contacted their pharmacy? Yes (Agent: If no, request that the patient contact the pharmacy for the refill. If patient does not wish to contact the pharmacy document the reason why and proceed with request.) (Agent: If yes, when and what did the pharmacy advise?)  Is this the correct pharmacy for this prescription? Yes If no, delete pharmacy and type the correct one.  This is the patient's preferred pharmacy:  [facility] - [place], [place] - [address] [place] [address] Phone: [phone] Fax: [phone]   Has the prescription been filled recently? Yes  Is the patient out of the medication? Yes  Has the patient been seen for an appointment in the last year OR does the patient have an upcoming appointment? Yes  Can we respond through MyChart? Yes  Agent: Please be advised that Rx refills may take up to 3 business days. We ask that you follow-up with your pharmacy.

## 2023-12-31 NOTE — Telephone Encounter (Signed)
 Rx ok'd for protonix #90 with one refill.

## 2023-12-31 NOTE — Telephone Encounter (Signed)
 Copied from CRM [id number]. Topic: Clinical - Medication Refill >> Dec 31, 2023 11:18 AM Adele Barthel wrote: Most Recent Primary Care Visit:  Provider: Dale Capitan  Department: [facility]  Visit Type: OFFICE VISIT  Date: 07/22/2023  Medication: pantoprazole (PROTONIX) 40 MG DR tablet TAKE (1) TABLET BY MOUTH DAILY 1 HOUR BEFORE BREAKFAST.  Has the patient contacted their pharmacy? Yes (Agent: If no, request that the patient contact the pharmacy for the refill. If patient does not wish to contact the pharmacy document the reason why and proceed with request.) (Agent: If yes, when and what did the pharmacy advise?)  Is this the correct pharmacy for this prescription? Yes If no, delete pharmacy and type the correct one.  This is the patient's preferred pharmacy:  [facility] - [place], [place] - [address] [place] [address] Phone: [phone] Fax: [phone]   Has the prescription been filled recently? No  Is the patient out of the medication? Yes  Has the patient been seen for an appointment in the last year OR does the patient have an upcoming appointment? Yes  Can we respond through MyChart? Yes  Agent: Please be advised that Rx refills may take up to 3 business days. We ask that you follow-up with your pharmacy.

## 2024-01-15 ENCOUNTER — Ambulatory Visit
Admission: RE | Admit: 2024-01-15 | Discharge: 2024-01-15 | Disposition: A | Discharge status: Home / Self Care | Source: Ambulatory Visit | Attending: Internal Medicine | Admitting: Internal Medicine

## 2024-01-15 DIAGNOSIS — Z1231 Encounter for screening mammogram for malignant neoplasm of breast: Secondary | ICD-10-CM | POA: Insufficient documentation

## 2024-01-26 ENCOUNTER — Other Ambulatory Visit: Payer: Self-pay | Admitting: Surgery

## 2024-01-27 ENCOUNTER — Encounter
Admission: RE | Admit: 2024-01-27 | Discharge: 2024-01-27 | Disposition: A | Discharge status: Home / Self Care | Source: Ambulatory Visit | Attending: Surgery | Admitting: Surgery

## 2024-01-27 ENCOUNTER — Telehealth: Payer: Self-pay | Admitting: *Deleted

## 2024-01-27 ENCOUNTER — Other Ambulatory Visit: Payer: Self-pay | Admitting: Internal Medicine

## 2024-01-27 ENCOUNTER — Other Ambulatory Visit: Payer: Self-pay

## 2024-01-27 VITALS — BP 145/59 | HR 69 | Temp 97.7°F | Resp 18 | Ht 64.0 in | Wt 147.9 lb

## 2024-01-27 DIAGNOSIS — Z01812 Encounter for preprocedural laboratory examination: Secondary | ICD-10-CM | POA: Diagnosis present

## 2024-01-27 DIAGNOSIS — Z01810 Encounter for preprocedural cardiovascular examination: Secondary | ICD-10-CM | POA: Diagnosis not present

## 2024-01-27 DIAGNOSIS — Z01818 Encounter for other preprocedural examination: Secondary | ICD-10-CM | POA: Diagnosis not present

## 2024-01-27 HISTORY — DX: Malignant (primary) neoplasm, unspecified: C80.1

## 2024-01-27 HISTORY — DX: Chronic diastolic (congestive) heart failure: I50.32

## 2024-01-27 HISTORY — DX: Atherosclerotic heart disease of native coronary artery without angina pectoris: I25.10

## 2024-01-27 LAB — CBC WITH DIFFERENTIAL/PLATELET
Abs Immature Granulocytes: 0.03 10*3/uL (ref 0.00–0.07)
Basophils Absolute: 0.1 10*3/uL (ref 0.0–0.1)
Basophils Relative: 1 %
Eosinophils Absolute: 0.2 10*3/uL (ref 0.0–0.5)
Eosinophils Relative: 2 %
HCT: 35.5 % — ABNORMAL LOW (ref 36.0–46.0)
Hemoglobin: 12.3 g/dL (ref 12.0–15.0)
Immature Granulocytes: 0 %
Lymphocytes Relative: 21 %
Lymphs Abs: 1.5 10*3/uL (ref 0.7–4.0)
MCH: 30.2 pg (ref 26.0–34.0)
MCHC: 34.6 g/dL (ref 30.0–36.0)
MCV: 87.2 fL (ref 80.0–100.0)
Monocytes Absolute: 0.5 10*3/uL (ref 0.1–1.0)
Monocytes Relative: 7 %
Neutro Abs: 4.8 10*3/uL (ref 1.7–7.7)
Neutrophils Relative %: 69 %
Platelets: 278 10*3/uL (ref 150–400)
RBC: 4.07 MIL/uL (ref 3.87–5.11)
RDW: 13 % (ref 11.5–15.5)
WBC: 7 10*3/uL (ref 4.0–10.5)
nRBC: 0 % (ref 0.0–0.2)

## 2024-01-27 LAB — COMPREHENSIVE METABOLIC PANEL WITH GFR
ALT: 19 U/L (ref 0–44)
AST: 22 U/L (ref 15–41)
Albumin: 4.1 g/dL (ref 3.5–5.0)
Alkaline Phosphatase: 66 U/L (ref 38–126)
Anion gap: 11 (ref 5–15)
BUN: 19 mg/dL (ref 8–23)
CO2: 26 mmol/L (ref 22–32)
Calcium: 9.3 mg/dL (ref 8.9–10.3)
Chloride: 102 mmol/L (ref 98–111)
Creatinine, Ser: 0.91 mg/dL (ref 0.44–1.00)
GFR, Estimated: >60 mL/min (ref >60)
Glucose, Bld: 163 mg/dL — ABNORMAL HIGH (ref 70–99)
Potassium: 3.4 mmol/L — ABNORMAL LOW (ref 3.5–5.1)
Sodium: 139 mmol/L (ref 135–145)
Total Bilirubin: 1.1 mg/dL (ref 0.0–1.2)
Total Protein: 7.1 g/dL (ref 6.5–8.1)

## 2024-01-27 LAB — URINALYSIS, ROUTINE W REFLEX MICROSCOPIC
Bilirubin Urine: NEGATIVE
Glucose, UA: NEGATIVE mg/dL
Hgb urine dipstick: NEGATIVE
Ketones, ur: NEGATIVE mg/dL
Nitrite: NEGATIVE
Protein, ur: NEGATIVE mg/dL
Specific Gravity, Urine: 1.012 (ref 1.005–1.030)
Squamous Epithelial / HPF: 0 /HPF (ref 0–5)
pH: 6 (ref 5.0–8.0)

## 2024-01-27 LAB — SURGICAL PCR SCREEN
MRSA, PCR: NEGATIVE
Staphylococcus aureus: NEGATIVE

## 2024-01-27 NOTE — Patient Instructions (Addendum)
 Your procedure is scheduled on: Thursday 02/05/24 Report to the Registration Desk on the 1st floor of the [facility]. To find out your arrival time, please call [phone] between 1PM - 3PM on: Wednesday 02/04/24 If your arrival time is 6:00 am, do not arrive before that time as the [facility] entrance doors do not open until 6:00 am.  REMEMBER: Instructions that are not followed completely may result in serious medical risk, up to and including death; or upon the discretion of your surgeon and anesthesiologist your surgery may need to be rescheduled.  Do not eat food after midnight the night before surgery.  No gum chewing or hard candies.  You may however, drink CLEAR liquids up to 2 hours before you are scheduled to arrive for your surgery. Do not drink anything within 2 hours of your scheduled arrival time.  Clear liquids include: - water  - apple juice without pulp - gatorade (not RED colors) - black coffee or tea (Do NOT add milk or creamers to the coffee or tea) Do NOT drink anything that is not on this list.  In addition, your doctor has ordered for you to drink the provided:  Ensure Pre-Surgery Clear Carbohydrate Drink  Drinking this carbohydrate drink up to two hours before surgery helps to reduce insulin resistance and improve patient outcomes. Please complete drinking 2 hours before scheduled arrival time.  One week prior to surgery: Stop Anti-inflammatories (NSAIDS) such as Advil, Aleve, Ibuprofen, Motrin, Naproxen, Naprosyn and Aspirin based products such as Excedrin, Goody's Powder, BC Powder. Stop ANY OVER THE COUNTER supplements until after surgery.  You may however, continue to take Tylenol if needed for pain up until the day of surgery.  Stop aspirin EC 81 MG 3 days prior to surgery as instructed by your doctor. ( Sunday 02/01/24)   Continue taking all of your other prescription medications up until the day of surgery.  ON THE DAY OF SURGERY ONLY TAKE THESE  MEDICATIONS WITH SIPS OF WATER:  pantoprazole (PROTONIX) 40 MG    No Alcohol for 24 hours before or after surgery.  No Smoking including e-cigarettes for 24 hours before surgery.  No chewable tobacco products for at least 6 hours before surgery.  No nicotine patches on the day of surgery.  Do not use any "recreational" drugs for at least a week (preferably 2 weeks) before your surgery.  Please be advised that the combination of cocaine and anesthesia may have negative outcomes, up to and including death. If you test positive for cocaine, your surgery will be cancelled.  On the morning of surgery brush your teeth with toothpaste and water, you may rinse your mouth with mouthwash if you wish. Do not swallow any toothpaste or mouthwash.  Use CHG Soap as directed on instruction sheet.  Do not wear jewelry, make-up, hairpins, clips or nail polish.  For welded (permanent) jewelry: bracelets, anklets, waist bands, etc.  Please have this removed prior to surgery.  If it is not removed, there is a chance that hospital personnel will need to cut it off on the day of surgery.  Do not wear lotions, powders, or perfumes.   Do not shave body hair from the neck down 48 hours before surgery.  Contact lenses, hearing aids and dentures may not be worn into surgery.  Do not bring valuables to the hospital. [facility] is not responsible for any missing/lost belongings or valuables.   Total Shoulder Arthroplasty:  use Benzoyl Peroxide 5% Gel as directed  on instruction sheet.  Bring your C-PAP to the hospital in case you may have to spend the night.   Notify your doctor if there is any change in your medical condition (cold, fever, infection).  Wear comfortable clothing (specific to your surgery type) to the hospital.  After surgery, you can help prevent lung complications by doing breathing exercises.  Take deep breaths and cough every 1-2 hours. Your doctor may order a device called an  Incentive Spirometer to help you take deep breaths. When coughing or sneezing, hold a pillow firmly against your incision with both hands. This is called "splinting." Doing this helps protect your incision. It also decreases belly discomfort.  If you are being admitted to the hospital overnight, leave your suitcase in the car. After surgery it may be brought to your room.  In case of increased patient census, it may be necessary for you, the patient, to continue your postoperative care in the Same Day Surgery department.  If you are being discharged the day of surgery, you will not be allowed to drive home. You will need a responsible individual to drive you home and stay with you for 24 hours after surgery.   If you are taking public transportation, you will need to have a responsible individual with you.  Please call the Pre-admissions Testing Dept. at [phone] if you have any questions about these instructions.  Surgery Visitation Policy:  Patients having surgery or a procedure may have two visitors.  Children under the age of 29 must have an adult with them who is not the patient.  Inpatient Visitation:    Visiting hours are 7 a.m. to 8 p.m. Up to four visitors are allowed at one time in a patient room. The visitors may rotate out with other people during the day.  One visitor age 61 or older may stay with the patient overnight and must be in the room by 8 p.m.    Pre-operative 5 CHG Bath Instructions   You can play a key role in reducing the risk of infection after surgery. Your skin needs to be as free of germs as possible. You can reduce the number of germs on your skin by washing with CHG (chlorhexidine gluconate) soap before surgery. CHG is an antiseptic soap that kills germs and continues to kill germs even after washing.   DO NOT use if you have an allergy to chlorhexidine/CHG or antibacterial soaps. If your skin becomes reddened or irritated, stop using the CHG and  notify one of our RNs at [phone].   Please shower with the CHG soap starting 4 days before surgery using the following schedule:     Please keep in mind the following:  DO NOT shave, including legs and underarms, starting the day of your first shower.   You may shave your face at any point before/day of surgery.  Place clean sheets on your bed the day you start using CHG soap. Use a clean washcloth (not used since being washed) for each shower. DO NOT sleep with pets once you start using the CHG.   CHG Shower Instructions:  If you choose to wash your hair and private area, wash first with your normal shampoo/soap.  After you use shampoo/soap, rinse your hair and body thoroughly to remove shampoo/soap residue.  Turn the water OFF and apply about 3 tablespoons (45 ml) of CHG soap to a CLEAN washcloth.  Apply CHG soap ONLY FROM YOUR NECK DOWN TO YOUR TOES (washing for  3-5 minutes)  DO NOT use CHG soap on face, private areas, open wounds, or sores.  Pay special attention to the area where your surgery is being performed.  If you are having back surgery, having someone wash your back for you may be helpful. Wait 2 minutes after CHG soap is applied, then you may rinse off the CHG soap.  Pat dry with a clean towel  Put on clean clothes/pajamas   If you choose to wear lotion, please use ONLY the CHG-compatible lotions on the back of this paper.     Additional instructions for the day of surgery: DO NOT APPLY any lotions, deodorants, cologne, or perfumes.   Put on clean/comfortable clothes.  Brush your teeth.  Ask your nurse before applying any prescription medications to the skin.      CHG Compatible Lotions   Aveeno Moisturizing lotion  Cetaphil Moisturizing Cream  Cetaphil Moisturizing Lotion  Clairol Herbal Essence Moisturizing Lotion, Dry Skin  Clairol Herbal Essence Moisturizing Lotion, Extra Dry Skin  Clairol Herbal Essence Moisturizing Lotion, Normal Skin  Curel Age  Defying Therapeutic Moisturizing Lotion with Alpha Hydroxy  Curel Extreme Care Body Lotion  Curel Soothing Hands Moisturizing Hand Lotion  Curel Therapeutic Moisturizing Cream, Fragrance-Free  Curel Therapeutic Moisturizing Lotion, Fragrance-Free  Curel Therapeutic Moisturizing Lotion, Original Formula  Eucerin Daily Replenishing Lotion  Eucerin Dry Skin Therapy Plus Alpha Hydroxy Crme  Eucerin Dry Skin Therapy Plus Alpha Hydroxy Lotion  Eucerin Original Crme  Eucerin Original Lotion  Eucerin Plus Crme Eucerin Plus Lotion  Eucerin TriLipid Replenishing Lotion  Keri Anti-Bacterial Hand Lotion  Keri Deep Conditioning Original Lotion Dry Skin Formula Softly Scented  Keri Deep Conditioning Original Lotion, Fragrance Free Sensitive Skin Formula  Keri Lotion Fast Absorbing Fragrance Free Sensitive Skin Formula  Keri Lotion Fast Absorbing Softly Scented Dry Skin Formula  Keri Original Lotion  Keri Skin Renewal Lotion Keri Silky Smooth Lotion  Keri Silky Smooth Sensitive Skin Lotion  Nivea Body Creamy Conditioning Oil  Nivea Body Extra Enriched Lotion  Nivea Body Original Lotion  Nivea Body Sheer Moisturizing Lotion Nivea Crme  Nivea Skin Firming Lotion  NutraDerm 30 Skin Lotion  NutraDerm Skin Lotion  NutraDerm Therapeutic Skin Cream  NutraDerm Therapeutic Skin Lotion  ProShield Protective Hand Cream  Provon moisturizing lotion  Preparing for Total Shoulder Arthroplasty  Before surgery, you can play an important role by reducing the number of germs on your skin by using the following products:  Benzoyl Peroxide Gel  o Reduces the number of germs present on the skin  o Applied twice a day to shoulder area starting two days before surgery  Chlorhexidine Gluconate (CHG) Soap  o An antiseptic cleaner that kills germs and bonds with the skin to continue killing germs even after washing  o Used for showering the night before surgery and morning of surgery  BENZOYL PEROXIDE 5%  GEL  Please do not use if you have an allergy to benzoyl peroxide. If your skin becomes reddened/irritated stop using the benzoyl peroxide.  Starting two days before surgery, apply as follows:  1. Apply benzoyl peroxide in the morning and at night. Apply after taking a shower. If you are not taking a shower, clean entire shoulder front, back, and side along with the armpit with a clean wet washcloth.  2. Place a quarter-sized dollop on your shoulder and rub in thoroughly, making sure to cover the front, back, and side of your shoulder, along with the armpit.  Tuesday 02/03/24                          Wednesday 02/04/24  2 days before ____ AM ____ PM 1 day before ____ AM ____ PM  3. Do this twice a day for two days. (Last application is the night before surgery, AFTER using the CHG soap).  4. Do NOT apply benzoyl peroxide gel on the day of surgery.Preparing for Total Shoulder Arthroplasty  Before surgery, you can play an important role by reducing the number of germs on your skin by using the following products:  How to Use an Incentive Spirometer  An incentive spirometer is a tool that measures how well you are filling your lungs with each breath. Learning to take long, deep breaths using this tool can help you keep your lungs clear and active. This may help to reverse or lessen your chance of developing breathing (pulmonary) problems, especially infection. You may be asked to use a spirometer: After a surgery. If you have a lung problem or a history of smoking. After a long period of time when you have been unable to move or be active. If the spirometer includes an indicator to show the highest number that you have reached, your health care provider or respiratory therapist will help you set a goal. Keep a log of your progress as told by your health care provider. What are the risks? Breathing too quickly may cause dizziness or cause you to pass out. Take your time so you  do not get dizzy or light-headed. If you are in pain, you may need to take pain medicine before doing incentive spirometry. It is harder to take a deep breath if you are having pain. How to use your incentive spirometer  Sit up on the edge of your bed or on a chair. Hold the incentive spirometer so that it is in an upright position. Before you use the spirometer, breathe out normally. Place the mouthpiece in your mouth. Make sure your lips are closed tightly around it. Breathe in slowly and as deeply as you can through your mouth, causing the piston or the ball to rise toward the top of the chamber. Hold your breath for 3-5 seconds, or for as long as possible. If the spirometer includes a coach indicator, use this to guide you in breathing. Slow down your breathing if the indicator goes above the marked areas. Remove the mouthpiece from your mouth and breathe out normally. The piston or ball will return to the bottom of the chamber. Rest for a few seconds, then repeat the steps 10 or more times. Take your time and take a few normal breaths between deep breaths so that you do not get dizzy or light-headed. Do this every 1-2 hours when you are awake. If the spirometer includes a goal marker to show the highest number you have reached (best effort), use this as a goal to work toward during each repetition. After each set of 10 deep breaths, cough a few times. This will help to make sure that your lungs are clear. If you have an incision on your chest or abdomen from surgery, place a pillow or a rolled-up towel firmly against the incision when you cough. This can help to reduce pain while taking deep breaths and coughing. General tips When you are able to get out of bed: Walk around often. Continue to take deep breaths and cough in order to clear your lungs.  Keep using the incentive spirometer until your health care provider says it is okay to stop using it. If you have been in the hospital, you  may be told to keep using the spirometer at home. Contact a health care provider if: You are having difficulty using the spirometer. You have trouble using the spirometer as often as instructed. Your pain medicine is not giving enough relief for you to use the spirometer as told. You have a fever. Get help right away if: You develop shortness of breath. You develop a cough with bloody mucus from the lungs. You have fluid or blood coming from an incision site after you cough. Summary An incentive spirometer is a tool that can help you learn to take long, deep breaths to keep your lungs clear and active. You may be asked to use a spirometer after a surgery, if you have a lung problem or a history of smoking, or if you have been inactive for a long period of time. Use your incentive spirometer as instructed every 1-2 hours while you are awake. If you have an incision on your chest or abdomen, place a pillow or a rolled-up towel firmly against your incision when you cough. This will help to reduce pain. Get help right away if you have shortness of breath, you cough up bloody mucus, or blood comes from your incision when you cough. This information is not intended to replace advice given to you by your health care provider. Make sure you discuss any questions you have with your health care provider.

## 2024-01-27 NOTE — Telephone Encounter (Signed)
 Patient has been scheduled for in person office visit for preop clearance

## 2024-01-27 NOTE — Telephone Encounter (Signed)
   Name: Kristen Strickland  DOB: 16-Jul-1946  MRN: [id number]  Primary Cardiologist: Yvonne Kendall, MD  Chart reviewed as part of pre-operative protocol coverage. Because of [name]'s past medical history and time since last visit, she will require a follow-up in-office visit in order to better assess preoperative cardiovascular risk.  Pre-op covering staff: - Please schedule appointment and call patient to inform them. If patient already had an upcoming appointment within acceptable timeframe, please add "pre-op clearance" to the appointment notes so provider is aware. - Please contact requesting surgeon's office via preferred method (i.e, phone, fax) to inform them of need for appointment prior to surgery.  She may hold aspirin for 5-7 days prior to procedure. Please resume aspirin as soon as possible postprocedure, at the discretion of the surgeon.    Denyce Robert, NP  01/27/2024, 3:01 PM

## 2024-01-27 NOTE — Telephone Encounter (Signed)
-----   Message from Verlee Monte sent at 01/27/2024  2:20 PM EDT ----- Regarding: Request for pre-operative cardiac clearance Request for pre-operative cardiac clearance:  1. What type of surgery is being performed?  TOTAL REVERSE SHOULDER ARTHROPLASTY  2. When is this surgery scheduled?  02/05/2024  3. Type of clearance being requested (medical, pharmacy, both)? MEDICAL   4. Are there any medications that need to be held prior to surgery? N/A - patient to continue daily LOW DOSE ASPIRIN throughout the perioperative course  5. Practice name and name of physician performing surgery?  Performing surgeon: Dr. Leron Croak, MD Requesting clearance: Quentin Mulling, FNP-C    6. Anesthesia type (none, local, MAC, general)? GENERAL  7. What is the office phone and fax number?   Phone: [phone] Fax: [phone]  ATTENTION: Unable to create telephone message as per your standard workflow. Directed by [facility] providers to send requests for cardiac clearance to this pool for appropriate distribution to provider covering pre-operative clearances.   Quentin Mulling, MSN, APRN, FNP-C, CEN [facility]  Peri-operative Services Nurse Practitioner Phone: [phone] 01/27/24 2:20 PM

## 2024-01-27 NOTE — Telephone Encounter (Signed)
   Pre-operative Risk Assessment    Patient Name: Kristen Strickland  DOB: 06/25/1946 MRN: [id number]   Date of last office visit: 07/17/22 DR. END Date of next office visit: NONE   Request for Surgical Clearance    Procedure:   TOTAL REVERSE SHOULDER ARTHROPLASTY  Date of Surgery:  Clearance 02/05/24                                Surgeon:  DR. Joice Lofts Surgeon's Group or Practice Name:  [facility]  Phone number:  [phone] Fax number:  [phone] Quentin Mulling, FNP   Type of Clearance Requested:   - Medical ; PER FORM PT DOES NOT NEED TO HOLD ASA   Type of Anesthesia:  General    Additional requests/questions:    Elpidio Anis   01/27/2024, 2:32 PM

## 2024-01-27 NOTE — Telephone Encounter (Signed)
 Tried calling patient to schedule in person office for preop clearance no answer left a detailed message to call back and schedule in person appointment

## 2024-01-27 NOTE — Telephone Encounter (Signed)
 I received a prescription request for meloxicam. This was a 30 days prescription she was given in 05/2024. This is not a regular medication. Please call and confirm she is doing ok. Did she request the refill?

## 2024-01-29 ENCOUNTER — Ambulatory Visit: Attending: Medical | Admitting: Medical

## 2024-01-29 ENCOUNTER — Encounter: Payer: Self-pay | Admitting: Medical

## 2024-01-29 VITALS — BP 146/84 | HR 57 | Ht 64.0 in | Wt 147.0 lb

## 2024-01-29 DIAGNOSIS — I10 Essential (primary) hypertension: Secondary | ICD-10-CM | POA: Diagnosis not present

## 2024-01-29 DIAGNOSIS — R011 Cardiac murmur, unspecified: Secondary | ICD-10-CM | POA: Insufficient documentation

## 2024-01-29 DIAGNOSIS — Z01810 Encounter for preprocedural cardiovascular examination: Secondary | ICD-10-CM | POA: Insufficient documentation

## 2024-01-29 DIAGNOSIS — I5032 Chronic diastolic (congestive) heart failure: Secondary | ICD-10-CM | POA: Diagnosis not present

## 2024-01-29 DIAGNOSIS — E782 Mixed hyperlipidemia: Secondary | ICD-10-CM | POA: Insufficient documentation

## 2024-01-29 DIAGNOSIS — I2510 Atherosclerotic heart disease of native coronary artery without angina pectoris: Secondary | ICD-10-CM | POA: Insufficient documentation

## 2024-01-29 NOTE — Patient Instructions (Signed)
 Medication Instructions:  NO CHANGES  *If you need a refill on your cardiac medications before your next appointment, please call your pharmacy*   Testing/Procedures: Your physician has requested that you have an echocardiogram. Echocardiography is a painless test that uses sound waves to create images of your heart. It provides your doctor with information about the size and shape of your heart and how well your heart's chambers and valves are working. This procedure takes approximately one hour. There are no restrictions for this procedure. Please do NOT wear cologne, perfume, aftershave, or lotions (deodorant is allowed). Please arrive 15 minutes prior to your appointment time.  Please note: We ask at that you not bring children with you during ultrasound (echo/ vascular) testing. Due to room size and safety concerns, children are not allowed in the ultrasound rooms during exams. Our front office staff cannot provide observation of children in our lobby area while testing is being conducted. An adult accompanying a patient to their appointment will only be allowed in the ultrasound room at the discretion of the ultrasound technician under special circumstances. We apologize for any inconvenience.   Follow-Up: At [facility], you and your health needs are our priority.  As part of our continuing mission to provide you with exceptional heart care, our providers are all part of one team.  This team includes your primary Cardiologist (physician) and Advanced Practice Providers or APPs (Physician Assistants and Nurse Practitioners) who all work together to provide you with the care you need, when you need it.  Your next appointment:   12 months with Dr. Okey Dupre  We recommend signing up for the patient portal called "MyChart".  Sign up information is provided on this After Visit Summary.  MyChart is used to connect with patients for Virtual Visits (Telemedicine).  Patients are able to view  lab/test results, encounter notes, upcoming appointments, etc.  Non-urgent messages can be sent to your provider as well.   To learn more about what you can do with MyChart, go to [email or web address].

## 2024-01-29 NOTE — Progress Notes (Signed)
 Cardiology Office Note:  .   Date:  01/29/2024  ID:  Kathleene Hazel, DOB Dec 23, 1945, MRN [id number] PCP: Dale Stephenville, MD  [facility] Providers Cardiologist:  Yvonne Kendall, MD {    History of Present Illness: .   TENLEY WINWARD is a 78 y.o. female with a h/o HFpEF, HTN, HLD, carotid artery stenosis followed by vascular surgery, and degenerative disease who presents for follow-up.  Cardiac CTA in 2022 showed coronary calcium score 141, 60th percentile for age and sex matched, mild proximal LAD disease, minimal disease in the LM, proximal RCA and mid left circumflex.  Overall mild nonobstructive CAD.  Echo showed EF 55 to 60%, grade 2 diastolic dysfunction, mild to moderately dilated left atrium, mild MR.  Most recent carotid ultrasound in 2024 showed right ICA 1 to 39% stenosis, left ICA 40 to 59% stenosis.  Patient was last seen in September 2023 reporting exertional dyspnea when she walks uphill.  Labs were ordered.  Today, patient is requiring total shoulder replacement.she has had shoulder issues for 2-3 years. The patient is Ok from a cardiac perspective. She denies chest pain, SOB. She does no formal activity. 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 She takes care of her grandchildren. She says she has foot issues for neuropathy. She has swelling on the left side. She has lightheadedness and dizziness when she stands up and walks. This occurs daily. She denies heart racing or palpitations.   Studies Reviewed: Marland Kitchen        EKG 01/27/24: NSR 65bpm, nonspecific ST/T wave changes  Carotid US 06/2023 Summary:  Right Carotid: Velocities in the right ICA are consistent with a 1-39%  stenosis.   Left Carotid: Velocities in the left ICA are consistent with a 40-59%  stenosis.   Vertebrals: Bilateral vertebral arteries demonstrate antegrade flow.  Subclavians: Normal flow hemodynamics were seen in bilateral subclavian               arteries.   *See table(s) above for  measurements and observations.   Echo 2022 1. Left ventricular ejection fraction, by estimation, is 55 to 60%. The  left ventricle has normal function. The left ventricle has no regional  wall motion abnormalities. Left ventricular diastolic parameters are  consistent with Grade II diastolic  dysfunction (pseudonormalization).   2. Right ventricular systolic function is normal. The right ventricular  size is normal. There is normal pulmonary artery systolic pressure. The  estimated right ventricular systolic pressure is 30.2 mmHg.   3. Left atrial size was mild to moderately dilated.   4. The mitral valve is normal in structure. Mild mitral valve  regurgitation. No evidence of mitral stenosis.   5. The aortic valve is normal in structure. Aortic valve regurgitation is  not visualized. No aortic stenosis is present.   6. The inferior vena cava is normal in size with greater than 50%  respiratory variability, suggesting right atrial pressure of 3 mmHg.   Comparison(s): LVEF 65%.   Cardiac CTA 07/2021 IMPRESSION: 1. Coronary calcium score of 141. This was 68th percentile for age and sex matched control.   2. Normal coronary origin with right dominance.   3. Mild proximal LAD disease   4. Minimal disease in the LM, proximal RCA and mid LCx.   5. CAD-RADS 2. Mild non-obstructive CAD (25-49%). Consider non-atherosclerotic causes of chest pain. Consider preventive therapy and risk factor modification.   Electronically Signed: By: Debbe Odea M.D. On: 08/09/2021 14:58  Physical Exam:   VS:  BP (!) 146/84 (BP Location: Left Arm, Patient Position: Sitting, Cuff Size: Normal)   Pulse (!) 57   Ht 5\' 4"  (1.626 m)   Wt 147 lb (66.7 kg)   SpO2 98%   BMI 25.23 kg/m    Wt Readings from Last 3 Encounters:  01/29/24 147 lb (66.7 kg)  01/27/24 147 lb 14.4 oz (67.1 kg)  07/22/23 149 lb 6.4 oz (67.8 kg)    GEN: Well nourished, well developed in no acute distress NECK: No JVD;  No carotid bruits CARDIAC: RRR, + murmur, no rubs, gallops RESPIRATORY:  Clear to auscultation without rales, wheezing or rhonchi  ABDOMEN: Soft, non-tender, non-distended EXTREMITIES:  No edema; No deformity   ASSESSMENT AND PLAN: .    Pre-op evaluation for right shoulder replacement Chronic HFpEF Mild CAD HTN HLD Mild MR Patient is needing a right shoulder replacement, scheduled for February 05, 2024.  She has had shoulder pain for the last 2 to 3 years.  Patient is overall doing well from a cardiac perspective.  She denies chest pain, significant shortness of breath, palpitations, heart racing.  She has chronic orthostatic symptoms that are unchanged.  She has mild swelling on the left foot.  Cardiac CTA in 2022 showed mild nonobstructive CAD.  Echo in 2022 showed EF of 55 to 60%, grade 2 diastolic dysfunction, mild MR.  Patient is euvolemic on exam today.  Blood pressure is mildly elevated, but this seems to be chronic.  I would be wary of increasing medications due to orthostatic symptoms.  I recommend echocardiogram to evaluate murmur, this would not hinder shoulder surgery.  Recent EKG showed normal sinus rhythm with nonspecific ST-T wave changes.  METs greater than 4.  According to RCRI patient has 3.9% risk of MACE.  Okay to hold aspirin 5 to 7 days prior to surgery.  Okay to proceed with surgery without further cardiac workup.      Dispo: Follow-up in 1 year  Signed, Maysel Mccolm David Stall, PA-C

## 2024-01-30 DIAGNOSIS — M19011 Primary osteoarthritis, right shoulder: Secondary | ICD-10-CM | POA: Diagnosis not present

## 2024-01-30 DIAGNOSIS — M7521 Bicipital tendinitis, right shoulder: Secondary | ICD-10-CM | POA: Diagnosis not present

## 2024-01-30 DIAGNOSIS — M75121 Complete rotator cuff tear or rupture of right shoulder, not specified as traumatic: Secondary | ICD-10-CM | POA: Diagnosis not present

## 2024-01-30 DIAGNOSIS — M7541 Impingement syndrome of right shoulder: Secondary | ICD-10-CM | POA: Diagnosis not present

## 2024-01-30 DIAGNOSIS — M7581 Other shoulder lesions, right shoulder: Secondary | ICD-10-CM | POA: Diagnosis not present

## 2024-02-02 ENCOUNTER — Encounter: Payer: Self-pay | Admitting: Surgery

## 2024-02-02 NOTE — Progress Notes (Signed)
 Perioperative / Anesthesia Services  Pre-Admission Testing Clinical Review / Pre-Operative Anesthesia Consult  Date: 02/02/24  Patient Demographics:  Name: Kristen Strickland DOB: [birth year] MRN:   [id number]  Planned Surgical Procedure(s):    Case: [id number] Date/Time: 02/05/24 0715   Procedure: ARTHROPLASTY, SHOULDER, TOTAL, REVERSE (Right: Shoulder)   Anesthesia type: Choice   Diagnosis:      Nontraumatic complete tear of right rotator cuff [M75.121]     Tendinitis of upper biceps tendon of right shoulder [M75.21]     Impingement syndrome, shoulder, right [M75.41]     Rotator cuff tendinitis, right [M75.81]   Pre-op diagnosis:      Nontraumatic complete tear of right rotator cuff M75.121     Tendinitis of upper biceps tendon of right shoulder M75.21     Impingement syndrome, shoulder, right M75.41     Rotator cuff tendinitis, right M75.81   Location: [facility] OR ROOM 02 / [facility]   Surgeons: Christena Flake, MD      NOTE: Available PAT nursing documentation and vital signs have been reviewed. Clinical nursing staff has updated patient's PMH/PSHx, current medication list, and drug allergies/intolerances to ensure comprehensive history available to assist in medical decision making as it pertains to the aforementioned surgical procedure and anticipated anesthetic course. Extensive review of available clinical information personally performed. [place] PMH and PSHx updated with any diagnoses/procedures that  may have been inadvertently omitted during his intake with the pre-admission testing department's nursing staff.  Clinical Discussion:  Kristen Strickland is a 78 y.o. female who is submitted for pre-surgical anesthesia review and clearance prior to her undergoing the above procedure. Patient has never been a smoker in the past. Pertinent PMH includes: CAD, HFpEF, BILATERAL carotid artery disease, palpitations, sinus bradycardia, aortic atherosclerosis, HTN, HLD, DOE,  OA, lumbar DDD with radiculopathy, lumbosacral spondylolisthesis, RIGHT rotator cuff tear, insomnia.  Patient is followed by cardiology (End, MD). She was last seen in the cardiology clinic on 01/29/2024; notes reviewed. At the time of her clinic visit, patient doing well overall from a cardiovascular perspective.  Patient reporting being lightheaded and dizzy associated with position changes; reported to be occurring on a daily basis. Patient denied any chest pain, shortness of breath, PND, orthopnea, palpitations, significant peripheral edema, weakness, fatigue, or presyncope/syncope. Patient with a past medical history significant for cardiovascular diagnoses. Documented physical exam was grossly benign, providing no evidence of acute exacerbation and/or decompensation of the patient's known cardiovascular conditions.  Myocardial perfusion imaging study was performed on 02/25/2017 revealing a normal left ventricular systolic function with a hyperdynamic LVEF of 65%.  There were no regional wall motion abnormalities.  SPECT images demonstrated a small in size, mild in severity, mixed defect involving the basal inferolateral segment, which was felt to potentially represent artifact versus a subtle scar.  Horizontal ST segment depression of 1.5 mm noted during stress in leads II, III, aVF, and V5-V6.  Long-term cardiac event monitor study performed on 03/25/2018 revealing underlying normal sinus rhythm.  There were rare PACs accounting for less than 1% of the total beats.  There was a 3 beat run at 122 bpm.  PVCs accounting for a total of 2% of the total study burden.  Coronary CTA was performed on 08/09/2021 that demonstrated an Agatston coronary artery calcium score of 141. This placed patient in the 76 percentile for age, sex, and race matched controls. Calcium depositions noted to be isolated mainly in the proximal LAD (25-49%), LM, proximal RCA,  and mid LCx (<25%) distributions.  Study demonstrates  normal coronary origin with RIGHT dominance.  Most recent TTE performed on 08/30/2021 revealed a normal left ventricular systolic function with an EF of 55-60%. There were no regional wall motion abnormalities. Left ventricular diastolic Doppler parameters consistent with pseudonormalization (G2DD).left atrium was mildly to moderately dilated. Right ventricular size and function normal with a TAPSE measuring 1.6 cm  (normal range >/= 1.6 cm).  RVSP = 30.2 mmHg.  There was mild mitral and tricuspid valve regurgitation. All transvalvular gradients were noted to be normal providing no evidence suggestive of valvular stenosis. Aorta normal in size with no evidence of ectasia or aneurysmal dilatation.  Patient with a documented history of BILATERAL carotid artery disease.  Most recent carotid Doppler study was performed on 07/01/2023 revealing a 1-39% stenosis of the RICA with a contralateral 40-59% stenosis of the LICA.  BILATERAL vertebral arteries demonstrated antegrade flow.  Normal flow hemodynamics observed in the subclavians.  Blood pressure mildly elevated at 146/84 mmHg on currently prescribed CCB (amlodipine), ARB (losartan), and diuretic (HCTZ) therapies.  Patient is on ezetimibe for her HLD diagnosis and ASCVD prevention. Patient is not diabetic. She does not have an OSAH diagnosis. Patient is able to complete all of her  ADL/IADLs without cardiovascular limitation.  Per the DASI, patient is able to achieve at least 4 METS of physical activity without experiencing any significant degree of angina/anginal equivalent symptoms. No changes were made to her medication regimen during her visit with cardiology.  Patient scheduled to follow-up with outpatient cardiology in 1 year or sooner if needed.  Kristen Strickland is scheduled for an elective RIGHT TOTAL REVERSE SHOULDER ARTHROPLASTY on 02/05/2024 with Dr. Delayne Feather, MD.  Given patient's past medical history significant for cardiovascular diagnoses,  presurgical cardiac clearance was sought by the PAT team. Per cardiology, "according to RCRI patient has 3.9% risk of MACE.  METS > 4. Based ACC/AHA guidelines, the patient's past medical history, and the amount of time since her last clinic visit, this patient would be at an overall ACCEPTABLE risk for the planned procedure without further cardiovascular testing or intervention at this time".   In review of the patient's chart, it is noted that she is on daily oral antithrombotic therapy. Given that patient's past medical history is significant for cardiovascular diagnoses, including but not limited to CAD, orthopedics has cleared patient to continue her daily low dose ASA throughout her perioperative course.  Patient has been updated on these directives from her specialty care providers by the PAT team.  Patient denies previous perioperative complications with anesthesia in the past. In review her EMR, it is noted that patient underwent a general anesthetic course at [facility] (ASA III) in 10/2018 without documented complications.      01/29/2024    8:06 AM 01/27/2024   12:55 PM 07/22/2023   11:40 AM  Vitals with BMI  Height 5\' 4"  5\' 4"  5\' 4"   Weight 147 lbs 147 lbs 14 oz 149 lbs 6 oz  BMI 25.22 25.37 25.63  Systolic 146 145 130  Diastolic 84 59 72  Pulse 57 69 69   Providers/Specialists:  NOTE: Primary physician provider listed below. Patient may have been seen by APP or partner within same practice.   PROVIDER ROLE / SPECIALTY LAST OV  Poggi, Kaylene Pascal, MD Orthopedics (Surgeon) 01/30/2024  Dellar Fenton, MD Primary Care Provider 07/22/2023  Sammy Crisp, MD Cardiology 01/29/2024  Devora Folks, MD Neurology 11/18/2023   Allergies:  No Known Allergies Current Home Medications:   No current facility-administered medications for this encounter.    acetaminophen (TYLENOL) 650 MG CR tablet   amLODipine (NORVASC) 2.5 MG tablet   aspirin EC 81 MG tablet   ezetimibe (ZETIA)  10 MG tablet   gabapentin (NEURONTIN) 100 MG capsule   losartan-hydrochlorothiazide (HYZAAR) 100-25 MG tablet   meloxicam (MOBIC) 7.5 MG tablet   Multiple Vitamins-Minerals (CENTRUM ADULTS PO)   pantoprazole (PROTONIX) 40 MG tablet   Probiotic Product (PROBIOTIC PO)   SODIUM FLUORIDE 5000 PPM 1.1 % PSTE   History:   Past Medical History:  Diagnosis Date   Arthritis    Carotid arterial disease (HCC)    a. 11/2017 Carotid U/S: RICA 1-39%, LICA 40-59%.    Chronic fatigue    Chronic heart failure with preserved ejection fraction (HCC)    Chronic leg pain    Complete tear of right rotator cuff    Coronary artery disease involving native coronary artery of native heart without angina pectoris    DDD (degenerative disc disease), lumbar    Dyspnea on exertion    a. 02/2017 Echo: EF 60-65%, no rwma, mild MR. Nl RV size/fxn. Nl PASP; b. 02/2017 MV: small, mild, fixed basal inferolateral defect - ? artifact vs scar. No ischemia. EF >65%.   Hypercholesterolemia    Hypertension    Insomnia    Long-term use of aspirin therapy    Mild cognitive impairment    Palpitations    a. 02/2017 Holter: Avg HR 66 (51-115), rare PACs, four brief runs of PAT - up to 5 beats, max rate 157. Rare isolated PVCs. No sustained arrhythmias; b. 03/2018 24h Holter: Rare PACs, 3 beat run PAT (122 bpm), PVCs (2% of beats).   Radiculopathy, lumbar region    Sinus bradycardia    Skin cancer    Spondylolisthesis of lumbosacral region    Past Surgical History:  Procedure Laterality Date   ABDOMINAL EXPOSURE N/A 11/10/2018   Procedure: ABDOMINAL EXPOSURE;  Surgeon: Dannis Dy, MD;  Location: [facility] OR;  Service: Vascular;  Laterality: N/A;   ABDOMINAL HYSTERECTOMY  1975   ANTERIOR CERVICAL DECOMP/DISCECTOMY FUSION N/A 04/09/2013   Procedure: ANTERIOR CERVICAL DECOMPRESSION/DISCECTOMY FUSION 2 LEVELS;  Surgeon: Adelbert Adler, MD;  Location: [facility] NEURO ORS;  Service: Neurosurgery;  Laterality: N/A;  Cervical five-six  Cervical six-seven  Anterior cervical decompression/diskectomy/fusion   ANTERIOR LUMBAR FUSION N/A 11/10/2018   Procedure: Anterior Lumbar Interbody Fusion-Lumbar five-Sacral one;  Surgeon: Agustina Aldrich, MD;  Location: [facility] OR;  Service: Neurosurgery;  Laterality: N/A;   BACK SURGERY     ruptured disc   RECONSTRUCTION OF NOSE     Family History  Problem Relation Age of Onset   Cancer Mother        ovarian/uterine   Heart disease Mother    Heart attack Mother    Stroke Mother    Colon cancer Father    Dementia Father    COPD Brother    Congestive Heart Failure Brother    Stroke Daughter    Heart attack Daughter    Suicidality Brother    Breast cancer Neg Hx    Social History   Tobacco Use   Smoking status: Never   Smokeless tobacco: Never  Substance Use Topics   Alcohol use: Not Currently    Alcohol/week: 0.0 standard drinks of alcohol    Comment: rarely   Pertinent Clinical Results:  LABS:  Hospital Outpatient Visit on 01/27/2024  Component Date  Value Ref Range Status   WBC 01/27/2024 7.0  4.0 - 10.5 K/uL Final   RBC 01/27/2024 4.07  3.87 - 5.11 MIL/uL Final   Hemoglobin 01/27/2024 12.3  12.0 - 15.0 g/dL Final   HCT 40/98/1191 35.5 (L)  36.0 - 46.0 % Final   MCV 01/27/2024 87.2  80.0 - 100.0 fL Final   MCH 01/27/2024 30.2  26.0 - 34.0 pg Final   MCHC 01/27/2024 34.6  30.0 - 36.0 g/dL Final   RDW 47/82/9562 13.0  11.5 - 15.5 % Final   Platelets 01/27/2024 278  150 - 400 K/uL Final   nRBC 01/27/2024 0.0  0.0 - 0.2 % Final   Neutrophils Relative % 01/27/2024 69  % Final   Neutro Abs 01/27/2024 4.8  1.7 - 7.7 K/uL Final   Lymphocytes Relative 01/27/2024 21  % Final   Lymphs Abs 01/27/2024 1.5  0.7 - 4.0 K/uL Final   Monocytes Relative 01/27/2024 7  % Final   Monocytes Absolute 01/27/2024 0.5  0.1 - 1.0 K/uL Final   Eosinophils Relative 01/27/2024 2  % Final   Eosinophils Absolute 01/27/2024 0.2  0.0 - 0.5 K/uL Final   Basophils Relative 01/27/2024 1  % Final    Basophils Absolute 01/27/2024 0.1  0.0 - 0.1 K/uL Final   Immature Granulocytes 01/27/2024 0  % Final   Abs Immature Granulocytes 01/27/2024 0.03  0.00 - 0.07 K/uL Final   Performed at [facility], [address] [place], [place] [address]   Sodium 01/27/2024 139  135 - 145 mmol/L Final   Potassium 01/27/2024 3.4 (L)  3.5 - 5.1 mmol/L Final   Chloride 01/27/2024 102  98 - 111 mmol/L Final   CO2 01/27/2024 26  22 - 32 mmol/L Final   Glucose, Bld 01/27/2024 163 (H)  70 - 99 mg/dL Final   Glucose reference range applies only to samples taken after fasting for at least 8 hours.   BUN 01/27/2024 19  8 - 23 mg/dL Final   Creatinine, Ser 01/27/2024 0.91  0.44 - 1.00 mg/dL Final   Calcium 57/84/6962 9.3  8.9 - 10.3 mg/dL Final   Total Protein 95/28/4132 7.1  6.5 - 8.1 g/dL Final   Albumin 44/10/270 4.1  3.5 - 5.0 g/dL Final   AST 53/66/4403 22  15 - 41 U/L Final   ALT 01/27/2024 19  0 - 44 U/L Final   Alkaline Phosphatase 01/27/2024 66  38 - 126 U/L Final   Total Bilirubin 01/27/2024 1.1  0.0 - 1.2 mg/dL Final   GFR, Estimated 01/27/2024 >60  >60 mL/min Final   Comment: (NOTE) Calculated using the CKD-EPI Creatinine Equation (2021)    Anion gap 01/27/2024 11  5 - 15 Final   Performed at [facility], [address] [place], [place] [address]   Color, Urine 01/27/2024 YELLOW (A)  YELLOW Final   APPearance 01/27/2024 CLEAR (A)  CLEAR Final   Specific Gravity, Urine 01/27/2024 1.012  1.005 - 1.030 Final   pH 01/27/2024 6.0  5.0 - 8.0 Final   Glucose, UA 01/27/2024 NEGATIVE  NEGATIVE mg/dL Final   Hgb urine dipstick 01/27/2024 NEGATIVE  NEGATIVE Final   Bilirubin Urine 01/27/2024 NEGATIVE  NEGATIVE Final   Ketones, ur 01/27/2024 NEGATIVE  NEGATIVE mg/dL Final   Protein, ur 95/63/8756 NEGATIVE  NEGATIVE mg/dL Final   Nitrite 43/32/9518 NEGATIVE  NEGATIVE Final   Leukocytes,Ua 01/27/2024 SMALL (A)  NEGATIVE Final   RBC / HPF 01/27/2024 0-5  0 -  5 RBC/hpf Final   WBC, UA  01/27/2024 6-10  0 - 5 WBC/hpf Final   Bacteria, UA 01/27/2024 RARE (A)  NONE SEEN Final   Squamous Epithelial / HPF 01/27/2024 0  0 - 5 /HPF Final   Hyaline Casts, UA 01/27/2024 PRESENT   Final   Performed at [facility], [address] [place], [place] [address]   MRSA, PCR 01/27/2024 NEGATIVE  NEGATIVE Final   Staphylococcus aureus 01/27/2024 NEGATIVE  NEGATIVE Final   Comment: (NOTE) The Xpert SA Assay (FDA approved for NASAL specimens in patients 20 years of age and older), is one component of a comprehensive surveillance program. It is not intended to diagnose infection nor to guide or monitor treatment. Performed at [facility], [address] [place], [place] [address]     ECG: Date: 01/27/2024  Time ECG obtained: 1404 PM Rate: 65 bpm Rhythm: normal sinus Axis (leads I and aVF): normal Intervals: PR 136 ms. QRS 76 ms. QTc 428 ms. ST segment and T wave changes: No evidence of acute T wave abnormalities or significant ST segment elevation or depression.  Evidence of a possible, age undetermined, prior infarct:  No Comparison: Similar to previous tracing obtained on 07/17/2022   IMAGING / PROCEDURES: MR SHOULDER RIGHT WO CONTRAST performed on 11/09/2023 Complete full-thickness, full width tear of the supraspinatus tendon with 2 cm of retraction. Mild infraspinatus tendinosis. Moderate subscapularis tendinosis. Mild tendinosis of the intra-articular portion of the long head of the biceps tendon with a longitudinal split tear of the proximal extra-articular portion.  VAS US CAROTID performed on 07/01/2023 Velocities in the right ICA are consistent with a 1-39% stenosis.  Velocities in the left ICA are consistent with a 40-59% stenosis.  Bilateral vertebral arteries demonstrate antegrade flow.  Normal flow hemodynamics were seen in bilateral subclavian arteries.   TRANSTHORACIC ECHOCARDIOGRAM performed on 08/30/2021 Left ventricular ejection  fraction, by estimation, is 55 to 60%. The left ventricle has normal function. The left ventricle has no regional wall motion abnormalities. Left ventricular diastolic parameters are consistent with Grade II diastolic dysfunction (pseudonormalization).  Right ventricular systolic function is normal. The right ventricular size is normal. There is normal pulmonary artery systolic pressure. The estimated right ventricular systolic pressure is 30.2 mmHg.  Left atrial size was mild to moderately dilated.  The mitral valve is normal in structure. Mild mitral valve regurgitation. No evidence of mitral stenosis.  The aortic valve is normal in structure. Aortic valve regurgitation is not visualized. No aortic stenosis is present.  The inferior vena cava is normal in size with greater than 50% respiratory variability, suggesting right atrial pressure of 3 mmHg.   CT CORONARY MORPH W/CTA COR W/SCORE W/CA W/CM &/OR WO/CM performed on 08/09/2021 Coronary calcium score of 141. This was 68th percentile for age and sex matched control. Normal coronary origin with right dominance. Mild proximal LAD disease Minimal disease in the LM, proximal RCA and mid LCx. CAD-RADS 2. Mild non-obstructive CAD (25-49%). Consider non-atherosclerotic causes of chest pain. Consider preventive therapy and risk factor modification.  LONG TERM CARDIAC EVENT MONITOR STUDY performed on 03/25/2018 Predominant normal sinus rhythm Rare PACs,  less than 1% of total beats 3 beat run at 122 bpm PVCs,  2% of total beats  Impression and Plan:  Kristen Strickland has been referred for pre-anesthesia review and clearance prior to her undergoing the planned anesthetic and procedural courses. Available labs, pertinent testing, and imaging results were personally reviewed by me in preparation  for upcoming operative/procedural course. [facility] medical record has been updated following extensive record review and patient interview with PAT staff.    This patient has been appropriately cleared by cardiology with an overall ACCEPTABLE risk of experiencing significant perioperative cardiovascular complications. Based on clinical review performed today (02/02/24), barring any significant acute changes in the patient's overall condition, it is anticipated that she will be able to proceed with the planned surgical intervention. Any acute changes in clinical condition may necessitate her procedure being postponed and/or cancelled. Patient will meet with anesthesia team (MD and/or CRNA) on the day of her procedure for preoperative evaluation/assessment. Questions regarding anesthetic course will be fielded at that time.   Pre-surgical instructions were reviewed with the patient during his PAT appointment, and questions were fielded to satisfaction by PAT clinical staff. She has been instructed on which medications that she will need to hold prior to surgery, as well as the ones that have been deemed safe/appropriate to take on the day of her procedure. As part of the general education provided by PAT, patient made aware both verbally and in writing, that she would need to abstain from the use of any illegal substances during her perioperative course. She was advised that failure to follow the provided instructions could necessitate case cancellation or result in serious perioperative complications up to and including death. Patient encouraged to contact PAT and/or her surgeon's office to discuss any questions or concerns that may arise prior to surgery; verbalized understanding.   Renate Caroline, MSN, APRN, FNP-C, CEN [facility]  Perioperative Services Nurse Practitioner Phone: [phone] Fax: [phone] 02/02/24 11:26 AM  NOTE: This note has been prepared using Dragon dictation software. Despite my best ability to proofread, there is always the potential that unintentional transcriptional errors may still occur from this  process.

## 2024-02-04 NOTE — Anesthesia Preprocedure Evaluation (Addendum)
 Anesthesia Evaluation  Patient identified by MRN, date of birth, ID band Patient awake    Reviewed: Allergy & Precautions, H&P , NPO status , Patient's Chart, lab work & pertinent test results  Airway Mallampati: II  TM Distance: >3 FB Neck ROM: full    Dental no notable dental hx.    Pulmonary neg pulmonary ROS   Pulmonary exam normal        Cardiovascular hypertension, + CAD, + Peripheral Vascular Disease, +CHF (Chronic heart failure with preserved ejection fraction) and + DOE  Normal cardiovascular exam+ Valvular Problems/Murmurs MR   Cardiac CTA in 2022 showed coronary calcium score 141, 60th percentile for age and sex matched, mild proximal LAD disease, minimal disease in the LM, proximal RCA and mid left circumflex. Overall mild nonobstructive CAD. Echo showed EF 55 to 60%, grade 2 diastolic dysfunction, mild to moderately dilated left atrium, mild MR. Most recent carotid ultrasound in 2024 showed right ICA 1 to 39% stenosis, left ICA 40 to 59% stenosis.    Neuro/Psych         Mild cognitive impairment Neuromuscular disease (h/o cervical fusion. left lower leg neuropathy)    GI/Hepatic negative GI ROS, Neg liver ROS,,,  Endo/Other  negative endocrine ROS    Renal/GU      Musculoskeletal  (+) Arthritis ,    Abdominal   Peds  Hematology negative hematology ROS (+)   Anesthesia Other Findings Past Medical History: No date: Aortic atherosclerosis (HCC) No date: Arthritis No date: Carotid arterial disease (HCC)     Comment:  a. 11/2017 Carotid U/S: RICA 1-39%, LICA 40-59%.  No date: Chronic fatigue No date: Chronic heart failure with preserved ejection fraction (HCC) No date: Chronic leg pain No date: Complete tear of right rotator cuff No date: Coronary artery disease involving native coronary artery of  native heart without angina pectoris No date: DDD (degenerative disc disease), lumbar No date: Dyspnea on  exertion     Comment:  a. 02/2017 Echo: EF 60-65%, no rwma, mild MR. Nl RV               size/fxn. Nl PASP; b. 02/2017 MV: small, mild, fixed basal              inferolateral defect - ? artifact vs scar. No ischemia.               EF >65%. No date: Hypercholesterolemia No date: Hypertension No date: Insomnia No date: Long-term use of aspirin therapy No date: Mild cognitive impairment No date: Palpitations     Comment:  a. 02/2017 Holter: Avg HR 66 (51-115), rare PACs, four               brief runs of PAT - up to 5 beats, max rate 157. Rare               isolated PVCs. No sustained arrhythmias; b. 03/2018 24h               Holter: Rare PACs, 3 beat run PAT (122 bpm), PVCs (2% of               beats). No date: Radiculopathy, lumbar region No date: Sinus bradycardia No date: Skin cancer No date: Spondylolisthesis of lumbosacral region  Past Surgical History: 11/10/2018: ABDOMINAL EXPOSURE; N/A     Comment:  Procedure: ABDOMINAL EXPOSURE;  Surgeon: Chuck Hint, MD;  Location:  [facility] OR;  Service: Vascular;               Laterality: N/A; 1975: ABDOMINAL HYSTERECTOMY 04/09/2013: ANTERIOR CERVICAL DECOMP/DISCECTOMY FUSION; N/A     Comment:  Procedure: ANTERIOR CERVICAL DECOMPRESSION/DISCECTOMY               FUSION 2 LEVELS;  Surgeon: Adelbert Adler, MD;                Location: [facility];  Service: Neurosurgery;                Laterality: N/A;  Cervical five-six Cervical six-seven                Anterior cervical decompression/diskectomy/fusion 11/10/2018: ANTERIOR LUMBAR FUSION; N/A     Comment:  Procedure: Anterior Lumbar Interbody Fusion-Lumbar               five-Sacral one;  Surgeon: Agustina Aldrich, MD;  Location: [facility]              OR;  Service: Neurosurgery;  Laterality: N/A; No date: BACK SURGERY     Comment:  ruptured disc No date: RECONSTRUCTION OF NOSE     Reproductive/Obstetrics negative OB ROS                               Anesthesia Physical Anesthesia Plan  ASA: 3  Anesthesia Plan: General ETT   Post-op Pain Management: Regional block* and Tylenol PO (pre-op)*   Induction: Intravenous  PONV Risk Score and Plan: 2 and Ondansetron and Dexamethasone  Airway Management Planned: Oral ETT  Additional Equipment:   Intra-op Plan:   Post-operative Plan:   Informed Consent: I have reviewed the patients History and Physical, chart, labs and discussed the procedure including the risks, benefits and alternatives for the proposed anesthesia with the patient or authorized representative who has indicated his/her understanding and acceptance.     Dental Advisory Given  Plan Discussed with: CRNA and Surgeon  Anesthesia Plan Comments: (MAP GOAL 80 mmhg Pre-anesthesia note reviewed)         Anesthesia Quick Evaluation

## 2024-02-05 ENCOUNTER — Ambulatory Visit

## 2024-02-05 ENCOUNTER — Ambulatory Visit: Payer: Self-pay | Admitting: Urgent Care

## 2024-02-05 ENCOUNTER — Encounter: Payer: Self-pay | Admitting: Surgery

## 2024-02-05 ENCOUNTER — Other Ambulatory Visit: Payer: Self-pay

## 2024-02-05 ENCOUNTER — Ambulatory Visit
Admission: RE | Admit: 2024-02-05 | Discharge: 2024-02-05 | Disposition: A | Discharge status: Home / Self Care | Source: Ambulatory Visit | Attending: Surgery | Admitting: Surgery

## 2024-02-05 ENCOUNTER — Encounter
Admission: RE | Disposition: A | Discharge status: Home / Self Care | Payer: Self-pay | Source: Ambulatory Visit | Attending: Surgery

## 2024-02-05 DIAGNOSIS — M4317 Spondylolisthesis, lumbosacral region: Secondary | ICD-10-CM | POA: Diagnosis not present

## 2024-02-05 DIAGNOSIS — Z471 Aftercare following joint replacement surgery: Secondary | ICD-10-CM | POA: Diagnosis not present

## 2024-02-05 DIAGNOSIS — M75121 Complete rotator cuff tear or rupture of right shoulder, not specified as traumatic: Secondary | ICD-10-CM | POA: Diagnosis not present

## 2024-02-05 DIAGNOSIS — Z981 Arthrodesis status: Secondary | ICD-10-CM | POA: Diagnosis not present

## 2024-02-05 DIAGNOSIS — I5032 Chronic diastolic (congestive) heart failure: Secondary | ICD-10-CM | POA: Insufficient documentation

## 2024-02-05 DIAGNOSIS — I110 Hypertensive heart disease with heart failure: Secondary | ICD-10-CM | POA: Insufficient documentation

## 2024-02-05 DIAGNOSIS — M7541 Impingement syndrome of right shoulder: Secondary | ICD-10-CM | POA: Diagnosis not present

## 2024-02-05 DIAGNOSIS — G629 Polyneuropathy, unspecified: Secondary | ICD-10-CM | POA: Diagnosis not present

## 2024-02-05 DIAGNOSIS — Z96611 Presence of right artificial shoulder joint: Secondary | ICD-10-CM | POA: Diagnosis not present

## 2024-02-05 DIAGNOSIS — M7521 Bicipital tendinitis, right shoulder: Secondary | ICD-10-CM | POA: Diagnosis not present

## 2024-02-05 DIAGNOSIS — M7581 Other shoulder lesions, right shoulder: Secondary | ICD-10-CM | POA: Insufficient documentation

## 2024-02-05 DIAGNOSIS — E7800 Pure hypercholesterolemia, unspecified: Secondary | ICD-10-CM | POA: Diagnosis not present

## 2024-02-05 DIAGNOSIS — I509 Heart failure, unspecified: Secondary | ICD-10-CM | POA: Diagnosis not present

## 2024-02-05 DIAGNOSIS — I700 Atherosclerosis of aorta: Secondary | ICD-10-CM | POA: Diagnosis not present

## 2024-02-05 DIAGNOSIS — I2510 Atherosclerotic heart disease of native coronary artery without angina pectoris: Secondary | ICD-10-CM | POA: Diagnosis not present

## 2024-02-05 DIAGNOSIS — M1990 Unspecified osteoarthritis, unspecified site: Secondary | ICD-10-CM | POA: Diagnosis not present

## 2024-02-05 DIAGNOSIS — Z7982 Long term (current) use of aspirin: Secondary | ICD-10-CM | POA: Insufficient documentation

## 2024-02-05 DIAGNOSIS — M5116 Intervertebral disc disorders with radiculopathy, lumbar region: Secondary | ICD-10-CM | POA: Insufficient documentation

## 2024-02-05 DIAGNOSIS — I739 Peripheral vascular disease, unspecified: Secondary | ICD-10-CM | POA: Insufficient documentation

## 2024-02-05 DIAGNOSIS — M12811 Other specific arthropathies, not elsewhere classified, right shoulder: Secondary | ICD-10-CM | POA: Diagnosis not present

## 2024-02-05 DIAGNOSIS — I6523 Occlusion and stenosis of bilateral carotid arteries: Secondary | ICD-10-CM | POA: Insufficient documentation

## 2024-02-05 DIAGNOSIS — G8918 Other acute postprocedural pain: Secondary | ICD-10-CM | POA: Diagnosis not present

## 2024-02-05 HISTORY — DX: Mild cognitive impairment of uncertain or unknown etiology: G31.84

## 2024-02-05 HISTORY — DX: Radiculopathy, lumbar region: M54.16

## 2024-02-05 HISTORY — DX: Complete rotator cuff tear or rupture of right shoulder, not specified as traumatic: M75.121

## 2024-02-05 HISTORY — DX: Spondylolisthesis, lumbosacral region: M43.17

## 2024-02-05 HISTORY — DX: Unspecified malignant neoplasm of skin, unspecified: C44.90

## 2024-02-05 HISTORY — DX: Atherosclerosis of aorta: I70.0

## 2024-02-05 HISTORY — PX: REVERSE SHOULDER ARTHROPLASTY: SHX5054

## 2024-02-05 HISTORY — DX: Long term (current) use of aspirin: Z79.82

## 2024-02-05 SURGERY — ARTHROPLASTY, SHOULDER, TOTAL, REVERSE
Anesthesia: General | Site: Shoulder | Laterality: Right

## 2024-02-05 MED ORDER — ONDANSETRON HCL 4 MG/2ML IJ SOLN
4.0000 mg | Freq: Four times a day (QID) | INTRAMUSCULAR | Status: DC | PRN
Start: 2024-02-05 — End: 2024-02-05

## 2024-02-05 MED ORDER — OXYCODONE HCL 5 MG/5ML PO SOLN: 5.0000 mg | Freq: Once | ORAL | Status: DC | PRN

## 2024-02-05 MED ORDER — LIDOCAINE HCL (CARDIAC) PF 100 MG/5ML IV SOSY
PREFILLED_SYRINGE | INTRAVENOUS | Status: DC | PRN
  Administered 2024-02-05: 60 mg via INTRAVENOUS

## 2024-02-05 MED ORDER — OXYCODONE HCL 5 MG PO TABS: 5.0000 mg | ORAL_TABLET | ORAL | Status: DC | PRN

## 2024-02-05 MED ORDER — EPHEDRINE 5 MG/ML INJ
INTRAVENOUS | Status: AC
  Filled 2024-02-05: qty 5

## 2024-02-05 MED ORDER — SODIUM CHLORIDE (PF) 0.9 % IJ SOLN
INTRAMUSCULAR | Status: AC
  Filled 2024-02-05: qty 10

## 2024-02-05 MED ORDER — ACETAMINOPHEN 10 MG/ML IV SOLN: 1000.0000 mg | Freq: Once | INTRAVENOUS | Status: DC | PRN

## 2024-02-05 MED ORDER — ACETAMINOPHEN 500 MG PO TABS
ORAL_TABLET | ORAL | Status: AC
  Filled 2024-02-05: qty 2

## 2024-02-05 MED ORDER — CEFAZOLIN SODIUM-DEXTROSE 2-4 GM/100ML-% IV SOLN
2.0000 g | Freq: Four times a day (QID) | INTRAVENOUS | Status: DC
  Administered 2024-02-05: 2 g via INTRAVENOUS

## 2024-02-05 MED ORDER — OXYCODONE HCL 5 MG PO TABS: 5.0000 mg | ORAL_TABLET | Freq: Once | ORAL | Status: DC | PRN

## 2024-02-05 MED ORDER — LIDOCAINE HCL (PF) 2 % IJ SOLN
INTRAMUSCULAR | Status: AC
  Filled 2024-02-05: qty 5

## 2024-02-05 MED ORDER — METOCLOPRAMIDE HCL 10 MG PO TABS: 5.0000 mg | ORAL_TABLET | Freq: Three times a day (TID) | ORAL | Status: DC | PRN

## 2024-02-05 MED ORDER — OXYCODONE HCL 5 MG PO TABS: 5.0000 mg | ORAL_TABLET | ORAL | 0 refills | Status: DC | PRN

## 2024-02-05 MED ORDER — LACTATED RINGERS IV SOLN: INTRAVENOUS | Status: DC

## 2024-02-05 MED ORDER — KETOROLAC TROMETHAMINE 15 MG/ML IJ SOLN
INTRAMUSCULAR | Status: AC
  Filled 2024-02-05: qty 1

## 2024-02-05 MED ORDER — GLYCOPYRROLATE 0.2 MG/ML IJ SOLN
INTRAMUSCULAR | Status: AC
  Filled 2024-02-05: qty 1

## 2024-02-05 MED ORDER — EZETIMIBE 10 MG PO TABS: 10.0000 mg | ORAL_TABLET | Freq: Every day | ORAL | Status: DC

## 2024-02-05 MED ORDER — BUPIVACAINE HCL (PF) 0.5 % IJ SOLN
INTRAMUSCULAR | Status: DC | PRN
  Administered 2024-02-05: 10 mL via PERINEURAL

## 2024-02-05 MED ORDER — ONDANSETRON HCL 4 MG PO TABS: 4.0000 mg | ORAL_TABLET | Freq: Four times a day (QID) | ORAL | Status: DC | PRN

## 2024-02-05 MED ORDER — SODIUM CHLORIDE 0.9 % IV SOLN
INTRAVENOUS | Status: DC | PRN
  Administered 2024-02-05: 20 mL

## 2024-02-05 MED ORDER — ROCURONIUM BROMIDE 100 MG/10ML IV SOLN
INTRAVENOUS | Status: DC | PRN
  Administered 2024-02-05: 60 mg via INTRAVENOUS

## 2024-02-05 MED ORDER — EPINEPHRINE PF 1 MG/ML IJ SOLN
INTRAMUSCULAR | Status: AC
  Filled 2024-02-05: qty 1

## 2024-02-05 MED ORDER — BUPIVACAINE-EPINEPHRINE (PF) 0.5% -1:200000 IJ SOLN
INTRAMUSCULAR | Status: AC
  Filled 2024-02-05: qty 30

## 2024-02-05 MED ORDER — BUPIVACAINE LIPOSOME 1.3 % IJ SUSP
INTRAMUSCULAR | Status: AC
  Filled 2024-02-05: qty 10

## 2024-02-05 MED ORDER — GLYCOPYRROLATE 0.2 MG/ML IJ SOLN
INTRAMUSCULAR | Status: DC | PRN
  Administered 2024-02-05: .2 mg via INTRAVENOUS

## 2024-02-05 MED ORDER — TRANEXAMIC ACID-NACL 1000-0.7 MG/100ML-% IV SOLN
1000.0000 mg | INTRAVENOUS | Status: AC
  Administered 2024-02-05: 1000 mg via INTRAVENOUS

## 2024-02-05 MED ORDER — PHENYLEPHRINE HCL-NACL 20-0.9 MG/250ML-% IV SOLN
INTRAVENOUS | Status: AC
  Filled 2024-02-05: qty 250

## 2024-02-05 MED ORDER — ACETAMINOPHEN 325 MG PO TABS: 325.0000 mg | ORAL_TABLET | Freq: Four times a day (QID) | ORAL | Status: DC | PRN

## 2024-02-05 MED ORDER — KETOROLAC TROMETHAMINE 15 MG/ML IJ SOLN
15.0000 mg | Freq: Once | INTRAMUSCULAR | Status: AC
  Administered 2024-02-05: 15 mg via INTRAVENOUS

## 2024-02-05 MED ORDER — PROPOFOL 10 MG/ML IV BOLUS
INTRAVENOUS | Status: DC | PRN
  Administered 2024-02-05: 80 mg via INTRAVENOUS

## 2024-02-05 MED ORDER — GABAPENTIN 100 MG PO CAPS: 100.0000 mg | ORAL_CAPSULE | Freq: Every day | ORAL | Status: DC

## 2024-02-05 MED ORDER — SODIUM CHLORIDE 0.9 % IV SOLN: INTRAVENOUS | Status: DC

## 2024-02-05 MED ORDER — CELECOXIB 200 MG PO CAPS
ORAL_CAPSULE | ORAL | Status: AC
  Filled 2024-02-05: qty 1

## 2024-02-05 MED ORDER — CELECOXIB 200 MG PO CAPS
200.0000 mg | ORAL_CAPSULE | Freq: Once | ORAL | Status: AC
  Administered 2024-02-05: 200 mg via ORAL

## 2024-02-05 MED ORDER — SODIUM CHLORIDE 0.9 % IR SOLN
Status: DC | PRN
  Administered 2024-02-05: 1

## 2024-02-05 MED ORDER — CEFAZOLIN SODIUM-DEXTROSE 2-4 GM/100ML-% IV SOLN
INTRAVENOUS | Status: AC
  Filled 2024-02-05: qty 100

## 2024-02-05 MED ORDER — ROCURONIUM BROMIDE 10 MG/ML (PF) SYRINGE
PREFILLED_SYRINGE | INTRAVENOUS | Status: AC
  Filled 2024-02-05: qty 10

## 2024-02-05 MED ORDER — BUPIVACAINE LIPOSOME 1.3 % IJ SUSP: 20.0000 mL | INTRAMUSCULAR | Status: DC

## 2024-02-05 MED ORDER — LOSARTAN POTASSIUM-HCTZ 100-25 MG PO TABS: 1.0000 | ORAL_TABLET | Freq: Every day | ORAL | Status: DC

## 2024-02-05 MED ORDER — BUPIVACAINE HCL (PF) 0.5 % IJ SOLN: 10.0000 mL | INTRAMUSCULAR | Status: DC

## 2024-02-05 MED ORDER — AMLODIPINE BESYLATE 2.5 MG PO TABS: 2.5000 mg | ORAL_TABLET | Freq: Every day | ORAL | Status: DC

## 2024-02-05 MED ORDER — EPHEDRINE SULFATE-NACL 50-0.9 MG/10ML-% IV SOSY
PREFILLED_SYRINGE | INTRAVENOUS | Status: DC | PRN
  Administered 2024-02-05: 5 mg via INTRAVENOUS

## 2024-02-05 MED ORDER — DEXAMETHASONE SODIUM PHOSPHATE 10 MG/ML IJ SOLN
INTRAMUSCULAR | Status: DC | PRN
  Administered 2024-02-05: 10 mg via INTRAVENOUS

## 2024-02-05 MED ORDER — BUPIVACAINE LIPOSOME 1.3 % IJ SUSP
INTRAMUSCULAR | Status: DC | PRN
  Administered 2024-02-05: 20 mL via PERINEURAL

## 2024-02-05 MED ORDER — BUPIVACAINE LIPOSOME 1.3 % IJ SUSP
INTRAMUSCULAR | Status: AC
  Filled 2024-02-05: qty 20

## 2024-02-05 MED ORDER — ACETAMINOPHEN 500 MG PO TABS
1000.0000 mg | ORAL_TABLET | Freq: Once | ORAL | Status: AC
  Administered 2024-02-05: 1000 mg via ORAL

## 2024-02-05 MED ORDER — FENTANYL CITRATE PF 50 MCG/ML IJ SOSY
50.0000 ug | PREFILLED_SYRINGE | INTRAMUSCULAR | Status: DC | PRN
  Administered 2024-02-05: 50 ug via INTRAVENOUS

## 2024-02-05 MED ORDER — PHENYLEPHRINE 80 MCG/ML (10ML) SYRINGE FOR IV PUSH (FOR BLOOD PRESSURE SUPPORT)
PREFILLED_SYRINGE | INTRAVENOUS | Status: DC | PRN
  Administered 2024-02-05 (×2): 80 ug via INTRAVENOUS

## 2024-02-05 MED ORDER — DEXAMETHASONE SODIUM PHOSPHATE 10 MG/ML IJ SOLN
INTRAMUSCULAR | Status: AC
  Filled 2024-02-05: qty 1

## 2024-02-05 MED ORDER — CHLORHEXIDINE GLUCONATE 0.12 % MT SOLN
15.0000 mL | Freq: Once | OROMUCOSAL | Status: AC
  Administered 2024-02-05: 15 mL via OROMUCOSAL

## 2024-02-05 MED ORDER — BUPIVACAINE HCL (PF) 0.5 % IJ SOLN
INTRAMUSCULAR | Status: AC
  Filled 2024-02-05: qty 10

## 2024-02-05 MED ORDER — BUPIVACAINE-EPINEPHRINE (PF) 0.5% -1:200000 IJ SOLN
INTRAMUSCULAR | Status: DC | PRN
  Administered 2024-02-05: 30 mL via PERINEURAL

## 2024-02-05 MED ORDER — PROPOFOL 10 MG/ML IV BOLUS
INTRAVENOUS | Status: AC
  Filled 2024-02-05: qty 20

## 2024-02-05 MED ORDER — CEFAZOLIN SODIUM-DEXTROSE 2-4 GM/100ML-% IV SOLN
2.0000 g | INTRAVENOUS | Status: AC
  Administered 2024-02-05: 2 g via INTRAVENOUS

## 2024-02-05 MED ORDER — DROPERIDOL 2.5 MG/ML IJ SOLN: 0.6250 mg | Freq: Once | INTRAMUSCULAR | Status: DC | PRN

## 2024-02-05 MED ORDER — TRANEXAMIC ACID-NACL 1000-0.7 MG/100ML-% IV SOLN
INTRAVENOUS | Status: AC
  Filled 2024-02-05: qty 100

## 2024-02-05 MED ORDER — CHLORHEXIDINE GLUCONATE 0.12 % MT SOLN
OROMUCOSAL | Status: AC
  Filled 2024-02-05: qty 15

## 2024-02-05 MED ORDER — FENTANYL CITRATE (PF) 100 MCG/2ML IJ SOLN: 25.0000 ug | INTRAMUSCULAR | Status: DC | PRN

## 2024-02-05 MED ORDER — PHENYLEPHRINE HCL-NACL 20-0.9 MG/250ML-% IV SOLN
INTRAVENOUS | Status: DC | PRN
  Administered 2024-02-05: 20 ug/min via INTRAVENOUS

## 2024-02-05 MED ORDER — ONDANSETRON HCL 4 MG/2ML IJ SOLN
INTRAMUSCULAR | Status: DC | PRN
  Administered 2024-02-05: 4 mg via INTRAVENOUS

## 2024-02-05 MED ORDER — FENTANYL CITRATE (PF) 100 MCG/2ML IJ SOLN
INTRAMUSCULAR | Status: AC
  Filled 2024-02-05: qty 2

## 2024-02-05 MED ORDER — SUGAMMADEX SODIUM 200 MG/2ML IV SOLN
INTRAVENOUS | Status: DC | PRN
  Administered 2024-02-05: 140 mg via INTRAVENOUS

## 2024-02-05 MED ORDER — METOCLOPRAMIDE HCL 5 MG/ML IJ SOLN: 5.0000 mg | Freq: Three times a day (TID) | INTRAMUSCULAR | Status: DC | PRN

## 2024-02-05 MED ORDER — FENTANYL CITRATE (PF) 100 MCG/2ML IJ SOLN
INTRAMUSCULAR | Status: DC | PRN
  Administered 2024-02-05: 25 ug via INTRAVENOUS

## 2024-02-05 MED ORDER — ORAL CARE MOUTH RINSE: 15.0000 mL | Freq: Once | OROMUCOSAL | Status: AC

## 2024-02-05 MED ORDER — ONDANSETRON HCL 4 MG/2ML IJ SOLN
INTRAMUSCULAR | Status: AC
  Filled 2024-02-05: qty 2

## 2024-02-05 MED ORDER — FENTANYL CITRATE PF 50 MCG/ML IJ SOSY
PREFILLED_SYRINGE | INTRAMUSCULAR | Status: AC
  Filled 2024-02-05: qty 1

## 2024-02-05 MED ORDER — MELOXICAM 7.5 MG PO TABS: 7.5000 mg | ORAL_TABLET | ORAL | Status: DC | PRN

## 2024-02-05 SURGICAL SUPPLY — 67 items
BASEPLATE BOSS DRILL (MISCELLANEOUS) IMPLANT
BIT DRILL 2.5X4.5XSCR (BIT) IMPLANT
BIT DRILL BASEPLATE CENTRAL S (BIT) ×1 IMPLANT
BIT DRILL F/BASEPLATE CENTRAL (BIT) IMPLANT
BIT DRL 2.5X4.5XSCR (BIT) ×1 IMPLANT
BLADE SAW SAG 25X90X1.19 (BLADE) ×1 IMPLANT
CHLORAPREP W/TINT 26 (MISCELLANEOUS) ×1 IMPLANT
COOLER POLAR GLACIER W/PUMP (MISCELLANEOUS) ×1 IMPLANT
DRAPE INCISE IOBAN 66X45 STRL (DRAPES) ×1 IMPLANT
DRAPE SHEET LG 3/4 BI-LAMINATE (DRAPES) ×1 IMPLANT
DRAPE TABLE BACK 80X90 (DRAPES) ×1 IMPLANT
DRILL BASEPLATE CENTRAL S (BIT) ×1 IMPLANT
DRSG OPSITE POSTOP 4X8 (GAUZE/BANDAGES/DRESSINGS) ×1 IMPLANT
ELECT BLADE 4.0 EZ CLEAN MEGAD (MISCELLANEOUS) ×1 IMPLANT
ELECT CAUTERY BLADE 6.4 (BLADE) ×1 IMPLANT
ELECT REM PT RETURN 9FT ADLT (ELECTROSURGICAL) ×1 IMPLANT
ELECTRODE BLDE 4.0 EZ CLN MEGD (MISCELLANEOUS) ×1 IMPLANT
ELECTRODE REM PT RTRN 9FT ADLT (ELECTROSURGICAL) ×1 IMPLANT
GAUZE XEROFORM 1X8 LF (GAUZE/BANDAGES/DRESSINGS) ×1 IMPLANT
GLENOSPHERE RSS 2 CONCENTRIC (Shoulder) IMPLANT
GLOVE BIO SURGEON STRL SZ7.5 (GLOVE) ×4 IMPLANT
GLOVE BIO SURGEON STRL SZ8 (GLOVE) ×4 IMPLANT
GLOVE BIOGEL PI IND STRL 8 (GLOVE) ×2 IMPLANT
GLOVE INDICATOR 8.0 STRL GRN (GLOVE) ×1 IMPLANT
GOWN STRL REUS W/ TWL LRG LVL3 (GOWN DISPOSABLE) ×2 IMPLANT
GOWN STRL REUS W/ TWL XL LVL3 (GOWN DISPOSABLE) ×1 IMPLANT
GUIDE PIN 2.0 X 150 (WIRE) IMPLANT
HANDLE YANKAUER SUCT OPEN TIP (MISCELLANEOUS) ×1 IMPLANT
HOOD PEEL AWAY T7 (MISCELLANEOUS) ×3 IMPLANT
KIT STABILIZATION SHOULDER (MISCELLANEOUS) ×1 IMPLANT
KIT TURNOVER KIT A (KITS) ×1 IMPLANT
LINER STD +0S RSS HXL (Liner) IMPLANT
MANIFOLD NEPTUNE II (INSTRUMENTS) ×1 IMPLANT
MASK FACE SPIDER DISP (MASK) ×1 IMPLANT
MAT ABSORB FLUID 56X50 GRAY (MISCELLANEOUS) ×1 IMPLANT
NDL MAYO CATGUT SZ1 (NEEDLE) IMPLANT
NDL SAFETY ECLIPSE 18X1.5 (NEEDLE) ×1 IMPLANT
NDL SPNL 20GX3.5 QUINCKE YW (NEEDLE) ×1 IMPLANT
NEEDLE MAYO CATGUT SZ1 (NEEDLE) IMPLANT
NEEDLE SPNL 20GX3.5 QUINCKE YW (NEEDLE) ×1 IMPLANT
PACK ARTHROSCOPY SHOULDER (MISCELLANEOUS) ×1 IMPLANT
PAD ARMBOARD POSITIONER FOAM (MISCELLANEOUS) ×1 IMPLANT
PAD WRAPON POLAR SHDR UNIV (MISCELLANEOUS) ×1 IMPLANT
PLATE BASE REVERSE RSS S (Plate) IMPLANT
PULSAVAC PLUS IRRIG FAN TIP (DISPOSABLE) ×1 IMPLANT
SCREW 4.5X15 RSS W CAP (Screw) IMPLANT
SCREW 4.5X25 RSS W CAP (Screw) IMPLANT
SCREW BN 40X4.5XSTAR CAP (Screw) IMPLANT
SCREW BODY REVERSE SMALL TITAN (Screw) IMPLANT
SLING ULTRA II LG (MISCELLANEOUS) IMPLANT
SLING ULTRA II M (MISCELLANEOUS) IMPLANT
SOL .9 NS 3000ML IRR UROMATIC (IV SOLUTION) ×1 IMPLANT
SPONGE T-LAP 18X18 ~~LOC~~+RFID (SPONGE) ×2 IMPLANT
STAPLER SKIN PROX 35W (STAPLE) ×1 IMPLANT
STEM PRESS FIT SZ 09 TSS (Stem) IMPLANT
SUT ETHIBOND 0 MO6 C/R (SUTURE) ×1 IMPLANT
SUT FIBERWIRE #2 38 BLUE 1/2 (SUTURE) ×3 IMPLANT
SUT VIC AB 0 CT1 36 (SUTURE) ×1 IMPLANT
SUT VIC AB 2-0 CT1 TAPERPNT 27 (SUTURE) ×2 IMPLANT
SUTURE FIBERWR #2 38 BLUE 1/2 (SUTURE) ×4 IMPLANT
SYR 10ML LL (SYRINGE) ×1 IMPLANT
SYR 30ML LL (SYRINGE) ×1 IMPLANT
SYR TOOMEY 50ML (SYRINGE) ×1 IMPLANT
TIP FAN IRRIG PULSAVAC PLUS (DISPOSABLE) ×1 IMPLANT
TRAP FLUID SMOKE EVACUATOR (MISCELLANEOUS) ×1 IMPLANT
WATER STERILE IRR 500ML POUR (IV SOLUTION) ×1 IMPLANT
WRAPON POLAR PAD SHDR UNIV (MISCELLANEOUS) ×1 IMPLANT

## 2024-02-05 NOTE — Transfer of Care (Signed)
 Immediate Anesthesia Transfer of Care Note  Patient: Kristen Strickland  Procedure(s) Performed: ARTHROPLASTY, SHOULDER, TOTAL, REVERSE (Right: Shoulder)  Patient Location: PACU  Anesthesia Type:General  Level of Consciousness: awake, alert , and drowsy  Airway & Oxygen Therapy: Patient Spontanous Breathing and Patient connected to face mask oxygen  Post-op Assessment: Report given to RN and Post -op Vital signs reviewed and stable  Post vital signs: Reviewed and stable  Last Vitals:  Vitals Value Taken Time  BP 152/54 02/05/24 0955  Temp 36 C 02/05/24 0955  Pulse 58 02/05/24 1000  Resp 22 02/05/24 1000  SpO2 100 % 02/05/24 1000  Vitals shown include unfiled device data.  Last Pain:  Vitals:   02/05/24 0627  TempSrc: Temporal  PainSc: 0-No pain         Complications: No notable events documented.

## 2024-02-05 NOTE — Anesthesia Procedure Notes (Addendum)
 Anesthesia Regional Block: Interscalene brachial plexus block   Pre-Anesthetic Checklist: , timeout performed,  Correct Patient, Correct Site, Correct Laterality,  Correct Procedure, Correct Position, site marked,  Risks and benefits discussed,  Surgical consent,  Pre-op evaluation,  At surgeon's request and post-op pain management  Laterality: Upper and Right  Prep: chloraprep       Needles:  Injection technique: Single-shot  Needle Type: Stimiplex     Needle Length: 9cm  Needle Gauge: 22     Additional Needles:   Procedures:,,,, ultrasound used (permanent image in chart),,    Narrative:  Start time: 02/05/2024 7:21 AM End time: 02/05/2024 7:25 AM Injection made incrementally with aspirations every 5 mL.  Performed by: Personally  Anesthesiologist: Baltazar Bonier, MD  Additional Notes: Patient consented for risk and benefits of nerve block including but not limited to nerve damage, failed block, bleeding and infection.  Patient voiced understanding.  Functioning IV was confirmed and monitors were applied.  Timeout done prior to procedure and prior to any sedation being given to the patient.  Patient confirmed procedure site prior to any sedation given to the patient. Sterile prep,hand hygiene and sterile gloves were used.  Minimal sedation used for procedure.  No paresthesia endorsed by patient during the procedure.  Negative aspiration and negative test dose prior to incremental administration of local anesthetic. The patient tolerated the procedure well with no immediate complications.

## 2024-02-05 NOTE — Evaluation (Signed)
 Occupational Therapy Evaluation Patient Details Name: Kristen Strickland MRN: [id number] DOB: Aug 30, 1946 Today's Date: 02/05/2024   History of Present Illness   78yo female s/p reverse TSA with bicep tenodesis on 4/17. PMHx includes CAD, HFpEF, BILATERAL carotid artery disease, palpitations, sinus bradycardia, aortic atherosclerosis, HTN, HLD, DOE, OA, lumbar DDD with radiculopathy, lumbosacral spondylolisthesis, RIGHT rotator cuff tear, insomnia.   Clinical Impressions Patient was seen for an OT evaluation this date. Pt lives with her spouse and daughter notes able to assist at discharge as well as spouse. Prior to surgery, pt was active and independent. Pt has orders for RUE to be immobilized and will be NWBing per MD. Patient presents with impaired strength/ROM, and sensation to RUE with block in place. These impairments result in a decreased ability to perform self care tasks requiring MIN-MOD assist for UB/LB dressing and bathing and MAX assist for application of polar care, compression stockings, and sling/immobilizer. Pt/family instructed in polar care mgt, compression stockings mgt, sling/immobilizer mgt, ROM restrictions, precautions, adaptive strategies for bathing/dressing/toileting/grooming, positioning and considerations for sleep, and home/routines modifications to maximize falls prevention, safety, and independence. Handout provided. OT adjusted sling/immobilizer and polar care to improve comfort, optimize positioning, and to maximize skin integrity/safety. Pt/family verbalized understanding of all education/training provided. Pt will benefit from additional follow up therapy as arranged per surgeon for rehab of shoulder.        If plan is discharge home, recommend the following:  A little help with walking and/or transfers;A lot of help with bathing/dressing/bathroom;Assistance with cooking/housework;Help with stairs or ramp for entrance;Assist for transportation   Functional Status  Assessment  Patient has had a recent decline in their functional status and demonstrates the ability to make significant improvements in function in a reasonable and predictable amount of time.   Equipment Recommendation None recommended by OT    Precautions/Restrictions   Precautions Precautions: Fall;Shoulder Shoulder Interventions: Shoulder sling/immobilizer;Shoulder abduction pillow;At all times;Off for dressing/bathing/exercises;Don joy ultra sling Precaution Booklet Issued: Yes (comment) Recall of Precautions/Restrictions: Intact Restrictions Weight Bearing Restrictions Per Provider Order: Yes RUE Weight Bearing Per Provider Order: Non weight bearing   Mobility Bed Mobility   General bed mobility comments: NT in recliner    Transfers Overall transfer level: Needs assistance   Transfers: Sit to/from Stand Sit to Stand: Supervision      Balance Overall balance assessment: Needs assistance Sitting-balance support: Feet supported, No upper extremity supported Sitting balance-Leahy Scale: Normal     Standing balance support: Single extremity supported, No upper extremity supported, During functional activity Standing balance-Leahy Scale: Fair         ADL either performed or assessed with clinical judgement   ADL     General ADL Comments: Pt requires MIN-MOD A for bathing, dressing UB/LB, MAX A for compression stocking, shoulder sling and polar care. Family present notes they are able to provide assist.      Pertinent Vitals/Pain Pain Assessment Pain Assessment: No/denies pain     Extremity/Trunk Assessment Upper Extremity Assessment Upper Extremity Assessment: Right hand dominant;RUE deficits/detail RUE Deficits / Details: rev TSA, block in place RUE Sensation: decreased proprioception;decreased light touch RUE Coordination: decreased fine motor;decreased gross motor   Lower Extremity Assessment Lower Extremity Assessment: Overall WFL for tasks  assessed       Communication Communication Communication: No apparent difficulties   Cognition Arousal: Alert Behavior During Therapy: WFL for tasks assessed/performed Cognition: No apparent impairments       Following commands: Intact  Cueing  General Comments   Cueing Techniques: Verbal cues      Exercises Other Exercises Other Exercises: Pt/dtr educated in home/routines modifications, shoulder precautions, AE/DME, shoulder sling, polar care, positioning, hemi techniques for ADL. Handout provided.        Home Living Family/patient expects to be discharged to:: Private residence Living Arrangements: Spouse/significant other Available Help at Discharge: Family;Available 24 hours/day Type of Home: House Home Access: Stairs to enter [employer] of Steps: 2 Entrance Stairs-Rails: None Home Layout: One level     Bathroom Shower/Tub: Tub/shower unit;Door   [employer] Toilet: Standard     Home Equipment: None          Prior Functioning/Environment Prior Level of Function : Independent/Modified Independent;Driving     Mobility Comments: indep ADLs Comments: indep    OT Problem List: Decreased range of motion;Decreased strength;Decreased coordination;Impaired sensation;Impaired UE functional use;Impaired balance (sitting and/or standing)        OT Goals(Current goals can be found in the care plan section)   Acute Rehab OT Goals Patient Stated Goal: be independent OT Goal Formulation: All assessment and education complete, DC therapy   AM-PAC OT "6 Clicks" Daily Activity     Outcome Measure Help from another person eating meals?: None Help from another person taking care of personal grooming?: None Help from another person toileting, which includes using toliet, bedpan, or urinal?: None Help from another person bathing (including washing, rinsing, drying)?: A Lot Help from another person to put on and taking off regular upper body  clothing?: A Lot Help from another person to put on and taking off regular lower body clothing?: A Little 6 Click Score: 19   End of Session Nurse Communication: Mobility status  Activity Tolerance: Patient tolerated treatment well Patient left: in chair;with call bell/phone within reach;with family/visitor present  OT Visit Diagnosis: Other abnormalities of gait and mobility (R26.89)                Time: 1191-4782 OT Time Calculation (min): 62 min Charges:  OT General Charges $OT Visit: 1 Visit OT Evaluation $OT Eval Low Complexity: 1 Low OT Treatments $Self Care/Home Management : 38-52 mins  Berenda Breaker., MPH, MS, OTR/L ascom [phone] 02/05/24, 2:07 PM

## 2024-02-05 NOTE — Discharge Instructions (Addendum)
 Orthopedic discharge instructions: May shower with intact OpSite dressing once nerve block has worn off (Monday).  Apply ice frequently to shoulder or use Polar Care device. Take ibuprofen 600-800 mg TID with meals for 7-10 days, then as necessary. Take oxycodone as prescribed when needed.  May supplement with ES Tylenol if necessary. Keep shoulder immobilizer on at all times except may remove for bathing purposes. Follow-up in 10-14 days or as scheduled.  POLAR CARE INFORMATION  MassAdvertisement.it  How to use [employer] Polar Care [facility] Therapy System?  YouTube   ShippingScam.co.uk  OPERATING INSTRUCTIONS  Start the product With dry hands, connect the transformer to the electrical connection located on the top of the cooler. Next, plug the transformer into an appropriate electrical outlet. The unit will automatically start running at this point.  To stop the pump, disconnect electrical power.  Unplug to stop the product when not in use. Unplugging the Polar Care unit turns it off. Always unplug immediately after use. Never leave it plugged in while unattended. Remove pad.    FIRST ADD WATER TO FILL LINE, THEN ICE---Replace ice when existing ice is almost melted  1 Discuss Treatment with your Licensed Health Care Practitioner and Use Only as Prescribed 2 Apply Insulation Barrier & Cold Therapy Pad 3 Check for Moisture 4 Inspect Skin Regularly  Tips and Trouble Shooting Usage Tips 1. Use cubed or chunked ice for optimal performance. 2. It is recommended to drain the Pad between uses. To drain the pad, hold the Pad upright with the hose pointed toward the ground. Depress the black plunger and allow water to drain out. 3. You may disconnect the Pad from the unit without removing the pad from the affected area by depressing the silver tabs on the hose coupling and gently pulling the hoses apart. The Pad and unit will seal itself and will not leak. Note: Some dripping  during release is normal. 4. DO NOT RUN PUMP WITHOUT WATER! The pump in this unit is designed to run with water. Running the unit without water will cause permanent damage to the pump. 5. Unplug unit before removing lid.  TROUBLESHOOTING GUIDE Pump not running, Water not flowing to the pad, Pad is not getting cold 1. Make sure the transformer is plugged into the wall outlet. 2. Confirm that the ice and water are filled to the indicated levels. 3. Make sure there are no kinks in the pad. 4. Gently pull on the blue tube to make sure the tube/pad junction is straight. 5. Remove the pad from the treatment site and ll it while the pad is lying at; then reapply. 6. Confirm that the pad couplings are securely attached to the unit. Listen for the double clicks (Figure 1) to confirm the pad couplings are securely attached.  Leaks    Note: Some condensation on the lines, controller, and pads is unavoidable, especially in warmer climates. 1. If using a [employer] Polar Care Cold Therapy unit with a detachable Cold Therapy Pad, and a leak exists (other than condensation on the lines) disconnect the pad couplings. Make sure the silver tabs on the couplings are depressed before reconnecting the pad to the pump hose; then confirm both sides of the coupling are properly clicked in. 2. If the coupling continues to leak or a leak is detected in the pad itself, stop using it and call [employer] Customer Care at [phone].  Cleaning After use, empty and dry the unit with a soft cloth. Warm water and mild  detergent may be used occasionally to clean the pump and tubes.  WARNING: The Polar Care Cube can be cold enough to cause serious injury, including full skin necrosis. Follow these Operating Instructions, and carefully read the Product Insert (see pouch on side of unit) and the Cold Therapy Pad Fitting Instructions (provided with each Cold Therapy Pad) prior to use.        Information for Discharge  Teaching: EXPAREL (bupivacaine liposome injectable suspension)   Pain relief is important to your recovery. The goal is to control your pain so you can move easier and return to your normal activities as soon as possible after your procedure. Your physician may use several types of medicines to manage pain, swelling, and more.  Your surgeon or anesthesiologist gave you EXPAREL(bupivacaine) to help control your pain after surgery.  EXPAREL is a local anesthetic designed to release slowly over an extended period of time to provide pain relief by numbing the tissue around the surgical site. EXPAREL is designed to release pain medication over time and can control pain for up to 72 hours. Depending on how you respond to EXPAREL, you may require less pain medication during your recovery. EXPAREL can help reduce or eliminate the need for opioids during the first few days after surgery when pain relief is needed the most. EXPAREL is not an opioid and is not addictive. It does not cause sleepiness or sedation.   Important! A teal colored band has been placed on your arm with the date, time and amount of EXPAREL you have received. Please leave this armband in place for the full 96 hours following administration, and then you may remove the band. If you return to the hospital for any reason within 96 hours following the administration of EXPAREL, the armband provides important information that your health care providers to know, and alerts them that you have received this anesthetic.    Possible side effects of EXPAREL: Temporary loss of sensation or ability to move in the area where medication was injected. Nausea, vomiting, constipation Rarely, numbness and tingling in your mouth or lips, lightheadedness, or anxiety may occur. Call your doctor right away if you think you may be experiencing any of these sensations, or if you have other questions regarding possible side effects.  Follow all other  discharge instructions given to you by your surgeon or nurse. Eat a healthy diet and drink plenty of water or other fluids.  SHOULDER SLING IMMOBILIZER   VIDEO Slingshot 2 Shoulder Brace Application - YouTube ---https://www.porter.info/  INSTRUCTIONS While supporting the injured arm, slide the forearm into the sling. Wrap the adjustable shoulder strap around the neck and shoulders and attach the strap end to the sling using  the "alligator strap tab."  Adjust the shoulder strap to the required length. Position the shoulder pad behind the neck. To secure the shoulder pad location (optional), pull the shoulder strap away from the shoulder pad, unfold the hook material on the top of the pad, then press the shoulder strap back onto the hook material to secure the pad in place. Attach the closure strap across the open top of the sling. Position the strap so that it holds the arm securely in the sling. Next, attach the thumb strap to the open end of the sling between the thumb and fingers. After sling has been fit, it may be easily removed and reapplied using the quick release buckle on shoulder strap. If a neutral pillow or 15 abduction pillow is included, place  the pillow at the waistline. Attach the sling to the pillow, lining up hook material on the pillow with the loop on sling. Adjust the waist strap to fit.  If waist strap is too long, cut it to fit. Use the small piece of double sided hook material (located on top of the pillow) to secure the strap end. Place the double sided hook material on the inside of the cut strap end and secure it to the waist strap.     If no pillow is included, attach the waist strap to the sling and adjust to fit.    Washing Instructions: Straps and sling must be removed and cleaned regularly depending on your activity level and perspiration. Hand wash straps and sling in cold water with mild detergent, rinse, air dry

## 2024-02-05 NOTE — Anesthesia Procedure Notes (Signed)
 Procedure Name: Intubation Date/Time: 02/05/2024 7:37 AM  Performed by: Ellwood Haber, CRNAPre-anesthesia Checklist: Patient identified, Patient being monitored, Timeout performed, Emergency Drugs available and Suction available Patient Re-evaluated:Patient Re-evaluated prior to induction Oxygen Delivery Method: Circle system utilized Preoxygenation: Pre-oxygenation with 100% oxygen Induction Type: IV induction Ventilation: Mask ventilation without difficulty Laryngoscope Size: 3 and McGrath Grade View: Grade I Tube type: Oral Tube size: 7.0 mm Number of attempts: 1 Airway Equipment and Method: Stylet Placement Confirmation: ETT inserted through vocal cords under direct vision, positive ETCO2 and breath sounds checked- equal and bilateral Secured at: 21 cm Tube secured with: Tape Dental Injury: Teeth and Oropharynx as per pre-operative assessment  Comments: Smooth atraumatic intubation, no complications noted.

## 2024-02-05 NOTE — Progress Notes (Signed)
 Patient successfully completed OT, tolerates po intake, ambulates, and able to void. Post op instructions given to patient and daughter. Patient prescription given to daughter (hard copy). All questions met to patient and family satisfaction.

## 2024-02-05 NOTE — H&P (Signed)
 History of Present Illness: Kristen Strickland is a 78 y.o. female who presents today for her surgical history of physical for upcoming right reverse total shoulder arthroplasty. The patient is scheduled undergo her surgery with Dr. Daun Epstein on 02/05/2024. The patient denies any trauma or injury affecting her right shoulder since her last evaluation. She states that the pain is a 0 out of 10 with her arm at her side but she continues to report moderate to severe pain with trying to reach above her head or out to the side. The patient denies any numbness or ting to the right upper extremity. She denies any personal history of heart attack or stroke. She denies any history of asthma or COPD. No personal history of blood clots. She is not diabetic. Most recent A1c was 5.9.  Past Medical History: Arthritis  Hyperlipidemia  Hypertension  Joint pain (Knee)   Past Surgical History: HYSTERECTOMY 1977 (partial with ovaries left in place)  Back surgery 1985 (L4/L5 discectomy)  EGD 02/16/2014 (No repeat per [facility])  COLONOSCOPY 02/16/2014 (Hyperplastic Polyps & FH Crohn's/precancerous lesion in colon (father) - repeat 5 years per Dr. Leatrice Provost)  Nasal surgery  Neck surgery   Past Family History: Emphysema Maternal Grandmother  Diabetes Maternal Grandmother  Pancreatic cancer Paternal Grandmother  Emphysema Paternal Grandfather  Ovarian cancer Mother  Thyroid disease Mother  Uterine cancer Mother  Crohn's disease Father  Irritable bowel syndrome Father   Medications: fluoride, sodium, (PREVIDENT 5000) 1.1 % dental paste once daily  acetaminophen (TYLENOL) 650 MG ER tablet Take 650 mg by mouth every 8 (eight) hours as needed  amLODIPine (NORVASC) 5 MG tablet  aspirin 81 MG EC tablet Take 81 mg by mouth once daily.  ezetimibe (ZETIA) 10 mg tablet Take 10 mg by mouth once daily  gabapentin (NEURONTIN) 100 MG capsule Take 1 capsule by mouth once daily  losartan-hydrochlorothiazide (HYZAAR) 100-25 mg tablet TAKE 1  TABLET BY MOUTH ONCE DAILY.  meloxicam (MOBIC) 15 MG tablet Take 1 tablet by mouth once daily  pantoprazole (PROTONIX) 40 MG DR tablet TAKE (1) TABLET BY MOUTH DAILY 1 HOUR BEFORE BREAKFAST.  rosuvastatin (CRESTOR) 20 MG tablet Take 1 tablet by mouth once daily   Allergies: No Known Allergies   Review of Systems:  A comprehensive 14 point ROS was performed, reviewed by me today, and the pertinent orthopaedic findings are documented in the HPI.  Physical Exam: BP 120/60  Ht 162.6 cm (5\' 4" )  Wt 67.1 kg (148 lb)  BMI 25.40 kg/m  General/Constitutional: The patient appears to be well-nourished, well-developed, and in no acute distress. Neuro/Psych: Normal mood and affect, oriented to person, place and time. Eyes: Non-icteric. Pupils are equal, round, and reactive to light, and exhibit synchronous movement. ENT: Unremarkable. Lymphatic: No palpable adenopathy. Respiratory: Lungs clear to auscultation, Normal chest excursion, No wheezes, and Non-labored breathing Cardiovascular: Regular rate and rhythm. No murmurs. and No edema, swelling or tenderness, except as noted in detailed exam. Integumentary: No impressive skin lesions present, except as noted in detailed exam. Musculoskeletal: Unremarkable, except as noted in detailed exam.  Right shoulder exam: SKIN: normal SWELLING: none WARMTH: none LYMPH NODES: no adenopathy palpable CREPITUS: none TENDERNESS: Mildly tender over anterolateral shoulder ROM (active):  Forward flexion: 145 degrees Abduction: 140 degrees Internal rotation: L4 ROM (passive):  Forward flexion: 155 degrees Abduction: 150 degrees  ER/IR at 90 abd: 90 degrees / 60 degrees  She experiences moderate pain with forward flexion and abduction, especially as she passes through a  range of 90 to 120 degrees, and mild pain with internal rotation as well as with internal and external rotation at 90 degrees of abduction.  STRENGTH: Forward flexion: 4/5 Abduction:  3/5 External rotation: 3+-4/5 Internal rotation: 4-4+/5 Pain with RC testing: Moderate pain with resisted abduction more so than with resisted forward flexion or external rotation.  STABILITY: Normal  SPECIAL TESTS: Zoila Hines' test: positive, moderate Speed's test: Mildly positive Capsulitis - pain w/ passive ER: no Crossed arm test: Mildly positive Crank: Not evaluated Anterior apprehension: Negative Posterior apprehension: Not evaluated  She is neurovascularly intact to the right upper extremity.  Imaging: MRI OF THE RIGHT SHOULDER:  1. Complete full-thickness, full width tear of the supraspinatus  tendon with 2 cm of retraction.  2. Mild infraspinatus tendinosis.  3. Moderate subscapularis tendinosis.  4. Mild tendinosis of the intra-articular portion of the long head  of the biceps tendon with a longitudinal split tear of the proximal  extra-articular portion.   Impression: Primary osteoarthritis of right shoulder [M19.011] Primary osteoarthritis of right shoulder (primary encounter diagnosis) Rotator cuff tendinitis, right Tendinitis of upper biceps tendon of right shoulder Nontraumatic complete tear of right rotator cuff Impingement syndrome, shoulder, right  Plan:  1. Treatment options were discussed today with the patient. 2. The patient is scheduled with Dr. Daun Epstein for a right reverse total shoulder arthroplasty on 02/05/2024. 3. The patient was instructed on the risk and benefits of surgical intervention and wishes to proceed at this time. 4. This document will serve as a surgical history and physical for the patient. 5. The patient will follow-up per standard postop protocol. They can call the clinic they have any questions, new symptoms develop or symptoms worsen.  The procedure was discussed with the patient, as were the potential risks (including bleeding, infection, nerve and/or blood vessel injury, persistent or recurrent pain, failure of the hardware,  dislocation, axillary nerve injury, stress fracture, need for further surgery, blood clots, strokes, heart attacks and/or arhythmias, pneumonia, etc.) and benefits. The patient states her understanding and wishes to proceed.    H&P reviewed and patient re-examined. No changes.

## 2024-02-05 NOTE — Op Note (Signed)
 02/05/2024  9:31 AM  Patient:   Kristen Strickland  Pre-Op Diagnosis:   Massive irreparable rotator cuff tear with early cuff arthropathy, right shoulder.  Post-Op Diagnosis:   Same with biceps tendinopathy.  Procedure:   Reverse right total shoulder arthroplasty with biceps tenodesis.  Surgeon:   Maryagnes Amos, MD  Assistant:   Horris Latino, PA-C; Martie Round, PA-S  Anesthesia:   General endotracheal with an interscalene block using Exparel placed preoperatively by the anesthesiologist.  Findings:   As above.  Complications:   None  EBL:   100 cc  Fluids:   350 cc crystalloid  UOP:   None  TT:   None  Drains:   None  Closure:   Staples  Implants:   All press-fit Integra system with a 9 mm stem, a small metaphyseal body, a +0 mm humeral platform, a mini baseplate, and a 38 mm concentric +2 mm laterally offset glenosphere.  Brief Clinical Note:   The patient is a 78 year old female with a history of progressively worsening right shoulder pain and weakness. Her symptoms have progressed despite medications, activity modification, etc. Her history and examination are consistent with a massive rotator cuff tear with early cuff arthropathy, all of which were confirmed by preoperative MRI scanning. The patient presents at this time for a reverse right total shoulder arthroplasty and biceps tenodesis.  Procedure:   The patient underwent placement of an interscalene block using Exparel by the anesthesiologist in the preoperative holding area before being brought into the operating room and lain in the supine position. The patient then underwent general endotracheal intubation and anesthesia before the patient was repositioned in the beach chair position using the beach chair positioner. The right shoulder and upper extremity were prepped with ChloraPrep solution before being draped sterilely. Preoperative antibiotics were administered.   A timeout was performed to verify the  appropriate surgical site before a standard anterior approach to the shoulder was made through an approximately 4-5 inch incision. The incision was carried down through the subcutaneous tissues to expose the deltopectoral fascia. The interval between the deltoid and pectoralis muscles was identified and this plane developed, retracting the cephalic vein laterally with the deltoid muscle. The conjoined tendon was identified. Its lateral margin was dissected and the Kolbel self-retraining retractor inserted. The "three sisters" were identified and cauterized. Bursal tissues were removed to improve visualization.   The biceps tendon was identified near the inferior aspect of the bicipital groove. A soft tissue tenodesis was performed by attaching the biceps tendon to the adjacent pectoralis major tendon using two #0 Ethibond interrupted sutures. The biceps tendon was then transected just proximal to the tenodesis site. The subscapularis tendon was released from its attachment to the lesser tuberosity 1 cm proximal to its insertion and several tagging sutures placed. The inferior capsule was released with care after identifying and protecting the axillary nerve. The proximal humeral cut was made at approximately 20 of retroversion using the extra-medullary guide.   Attention was redirected to the glenoid. The labrum was debrided circumferentially before the center of the glenoid was identified. The guidewire was drilled into the glenoid neck using the appropriate guide. After verifying its position, it was overreamed with the mini-baseplate reamer to create a flat surface before the stem reamer was utilized. The superior and inferior peg sites were reamed using the appropriate guide to complete the glenoid preparation. The permanent mini-baseplate was impacted into place. It was stabilized with a 25 x 4.5 mm central  screw and four peripheral screws. Locking caps were placed over the superior and inferior screws.  The permanent 38 mm concentric glenosphere with +2 mm of lateral offset was then impacted into place and its Morse taper locking mechanism verified using manual distraction.  Attention was directed to the humeral side. The humeral canal was prepared utilizing the tapered stem reamers sequentially beginning with the 7 mm stem and progressing to a 9 mm stem. This demonstrated a good tight fit. The metaphyseal region was then prepared using the appropriate planar device. The trial stem and small metaphyseal body were put together on the back table and a trial reduction performed using the +0 mm insert. With the +0 mm insert, the arm demonstrated excellent range of motion as the hand could be brought across the chest to the opposite shoulder and brought to the top of the patient's head and to the patient's ear. The shoulder appeared stable throughout this range of motion. The joint was dislocated and the trial components removed.   The permanent 9 mm stem with the small body was impacted into place with care taken to maintain the appropriate version. A repeat trial reduction with the +0 mm insert again demonstrated excellent stability with the findings as described above. Therefore, the shoulder was re-dislocated and, after inserting the locking screw to secure the body to the stem, the permanent +0 mm insert impacted into place. After verifying its locking mechanism, the shoulder was relocated using two finger pressure and again placed through a range of motion with the findings as described above.  The wound was copiously irrigated with sterile saline solution using the jet lavage system before a total of 10 cc of Exparel diluted out to 50 cc with normal saline and 30 cc of 0.5% Sensorcaine with epinephrine was injected into the pericapsular and peri-incisional tissues to help with postoperative analgesia. The subscapularis tendon was reapproximated using #2 FiberWire interrupted sutures. The deltopectoral  interval was closed using #0 Vicryl interrupted sutures before the subcutaneous tissues were closed using 2-0 Vicryl interrupted sutures. The skin was closed using staples. A sterile occlusive dressing was applied to the wound before the arm was placed into a shoulder immobilizer with an abduction pillow. A Polar Care system also was applied to the shoulder. The patient was then transferred back to a hospital bed before being awakened, extubated, and returned to the recovery room in satisfactory condition after tolerating the procedure well.

## 2024-02-05 NOTE — Anesthesia Postprocedure Evaluation (Signed)
 Anesthesia Post Note  Patient: Kristen Strickland  Procedure(s) Performed: ARTHROPLASTY, SHOULDER, TOTAL, REVERSE (Right: Shoulder)  Patient location during evaluation: PACU Anesthesia Type: General Level of consciousness: awake and alert Pain management: pain level controlled Vital Signs Assessment: post-procedure vital signs reviewed and stable Respiratory status: spontaneous breathing, nonlabored ventilation and respiratory function stable Cardiovascular status: blood pressure returned to baseline and stable Postop Assessment: no apparent nausea or vomiting Anesthetic complications: no   No notable events documented.   Last Vitals:  Vitals:   02/05/24 1100 02/05/24 1113  BP: (!) 111/91 (!) 157/59  Pulse: 66 70  Resp: 20 19  Temp:  36.4 C  SpO2: 96% 99%    Last Pain:  Vitals:   02/05/24 1113  TempSrc: Temporal  PainSc: 0-No pain                 Baltazar Bonier

## 2024-02-06 ENCOUNTER — Encounter: Payer: Self-pay | Admitting: Surgery

## 2024-02-07 DIAGNOSIS — Z471 Aftercare following joint replacement surgery: Secondary | ICD-10-CM | POA: Diagnosis not present

## 2024-02-07 DIAGNOSIS — Z791 Long term (current) use of non-steroidal anti-inflammatories (NSAID): Secondary | ICD-10-CM | POA: Diagnosis not present

## 2024-02-07 DIAGNOSIS — Z96611 Presence of right artificial shoulder joint: Secondary | ICD-10-CM | POA: Diagnosis not present

## 2024-02-07 DIAGNOSIS — E785 Hyperlipidemia, unspecified: Secondary | ICD-10-CM | POA: Diagnosis not present

## 2024-02-07 DIAGNOSIS — M1990 Unspecified osteoarthritis, unspecified site: Secondary | ICD-10-CM | POA: Diagnosis not present

## 2024-02-07 DIAGNOSIS — I10 Essential (primary) hypertension: Secondary | ICD-10-CM | POA: Diagnosis not present

## 2024-02-07 DIAGNOSIS — M7521 Bicipital tendinitis, right shoulder: Secondary | ICD-10-CM | POA: Diagnosis not present

## 2024-02-07 DIAGNOSIS — Z7982 Long term (current) use of aspirin: Secondary | ICD-10-CM | POA: Diagnosis not present

## 2024-02-11 DIAGNOSIS — Z96611 Presence of right artificial shoulder joint: Secondary | ICD-10-CM | POA: Diagnosis not present

## 2024-02-11 DIAGNOSIS — M1990 Unspecified osteoarthritis, unspecified site: Secondary | ICD-10-CM | POA: Diagnosis not present

## 2024-02-11 DIAGNOSIS — Z471 Aftercare following joint replacement surgery: Secondary | ICD-10-CM | POA: Diagnosis not present

## 2024-02-11 DIAGNOSIS — I10 Essential (primary) hypertension: Secondary | ICD-10-CM | POA: Diagnosis not present

## 2024-02-11 DIAGNOSIS — M7521 Bicipital tendinitis, right shoulder: Secondary | ICD-10-CM | POA: Diagnosis not present

## 2024-02-11 DIAGNOSIS — E785 Hyperlipidemia, unspecified: Secondary | ICD-10-CM | POA: Diagnosis not present

## 2024-02-16 ENCOUNTER — Ambulatory Visit: Payer: Medicare Other | Admitting: Internal Medicine

## 2024-02-19 DIAGNOSIS — M25511 Pain in right shoulder: Secondary | ICD-10-CM | POA: Diagnosis not present

## 2024-02-23 DIAGNOSIS — Z471 Aftercare following joint replacement surgery: Secondary | ICD-10-CM | POA: Diagnosis not present

## 2024-02-27 DIAGNOSIS — M25511 Pain in right shoulder: Secondary | ICD-10-CM | POA: Diagnosis not present

## 2024-03-01 ENCOUNTER — Ambulatory Visit: Attending: Medical

## 2024-03-01 DIAGNOSIS — R011 Cardiac murmur, unspecified: Secondary | ICD-10-CM

## 2024-03-01 LAB — ECHOCARDIOGRAM COMPLETE
AR max vel: 2.13 cm2
AV Area VTI: 2.09 cm2
AV Area mean vel: 2.11 cm2
AV Mean grad: 3 mmHg
AV Peak grad: 6.2 mmHg
Ao pk vel: 1.24 m/s
Area-P 1/2: 4.15 cm2
S' Lateral: 2.87 cm

## 2024-03-02 DIAGNOSIS — M25511 Pain in right shoulder: Secondary | ICD-10-CM | POA: Diagnosis not present

## 2024-03-04 ENCOUNTER — Ambulatory Visit: Payer: Self-pay | Admitting: Medical

## 2024-03-04 DIAGNOSIS — M25511 Pain in right shoulder: Secondary | ICD-10-CM | POA: Diagnosis not present

## 2024-03-09 DIAGNOSIS — M25511 Pain in right shoulder: Secondary | ICD-10-CM | POA: Diagnosis not present

## 2024-03-11 DIAGNOSIS — M25511 Pain in right shoulder: Secondary | ICD-10-CM | POA: Diagnosis not present

## 2024-03-16 DIAGNOSIS — M25511 Pain in right shoulder: Secondary | ICD-10-CM | POA: Diagnosis not present

## 2024-03-17 DIAGNOSIS — G309 Alzheimer's disease, unspecified: Secondary | ICD-10-CM | POA: Diagnosis not present

## 2024-03-17 DIAGNOSIS — Z1331 Encounter for screening for depression: Secondary | ICD-10-CM | POA: Diagnosis not present

## 2024-03-17 DIAGNOSIS — F0670 Mild neurocognitive disorder due to known physiological condition without behavioral disturbance: Secondary | ICD-10-CM | POA: Diagnosis not present

## 2024-03-18 DIAGNOSIS — M25511 Pain in right shoulder: Secondary | ICD-10-CM | POA: Diagnosis not present

## 2024-03-22 DIAGNOSIS — Z96611 Presence of right artificial shoulder joint: Secondary | ICD-10-CM | POA: Diagnosis not present

## 2024-03-23 DIAGNOSIS — M25511 Pain in right shoulder: Secondary | ICD-10-CM | POA: Diagnosis not present

## 2024-03-25 DIAGNOSIS — M25511 Pain in right shoulder: Secondary | ICD-10-CM | POA: Diagnosis not present

## 2024-03-30 DIAGNOSIS — M25511 Pain in right shoulder: Secondary | ICD-10-CM | POA: Diagnosis not present

## 2024-04-02 DIAGNOSIS — M25511 Pain in right shoulder: Secondary | ICD-10-CM | POA: Diagnosis not present

## 2024-04-05 ENCOUNTER — Ambulatory Visit (INDEPENDENT_AMBULATORY_CARE_PROVIDER_SITE_OTHER): Admitting: Internal Medicine

## 2024-04-05 ENCOUNTER — Encounter: Payer: Self-pay | Admitting: Internal Medicine

## 2024-04-05 VITALS — BP 118/70 | HR 62 | Temp 97.9°F | Resp 16 | Ht 64.0 in | Wt 143.8 lb

## 2024-04-05 DIAGNOSIS — T466X5A Adverse effect of antihyperlipidemic and antiarteriosclerotic drugs, initial encounter: Secondary | ICD-10-CM | POA: Diagnosis not present

## 2024-04-05 DIAGNOSIS — R1013 Epigastric pain: Secondary | ICD-10-CM | POA: Diagnosis not present

## 2024-04-05 DIAGNOSIS — I779 Disorder of arteries and arterioles, unspecified: Secondary | ICD-10-CM | POA: Diagnosis not present

## 2024-04-05 DIAGNOSIS — R200 Anesthesia of skin: Secondary | ICD-10-CM

## 2024-04-05 DIAGNOSIS — E785 Hyperlipidemia, unspecified: Secondary | ICD-10-CM | POA: Diagnosis not present

## 2024-04-05 DIAGNOSIS — D692 Other nonthrombocytopenic purpura: Secondary | ICD-10-CM

## 2024-04-05 DIAGNOSIS — I5032 Chronic diastolic (congestive) heart failure: Secondary | ICD-10-CM | POA: Diagnosis not present

## 2024-04-05 DIAGNOSIS — R202 Paresthesia of skin: Secondary | ICD-10-CM

## 2024-04-05 DIAGNOSIS — I700 Atherosclerosis of aorta: Secondary | ICD-10-CM | POA: Diagnosis not present

## 2024-04-05 DIAGNOSIS — G720 Drug-induced myopathy: Secondary | ICD-10-CM | POA: Diagnosis not present

## 2024-04-05 DIAGNOSIS — R413 Other amnesia: Secondary | ICD-10-CM | POA: Diagnosis not present

## 2024-04-05 DIAGNOSIS — I10 Essential (primary) hypertension: Secondary | ICD-10-CM

## 2024-04-05 DIAGNOSIS — Z0000 Encounter for general adult medical examination without abnormal findings: Secondary | ICD-10-CM

## 2024-04-05 DIAGNOSIS — R739 Hyperglycemia, unspecified: Secondary | ICD-10-CM

## 2024-04-05 NOTE — Assessment & Plan Note (Signed)
 Physical today 04/05/24.  Declined breast exam.  Mammogram 01/15/24 - Birads I.  Need colonoscopy report.

## 2024-04-05 NOTE — Assessment & Plan Note (Signed)
Recent cbc wnl.  Follow.   

## 2024-04-05 NOTE — Assessment & Plan Note (Signed)
 Intolerant to crestor .  Leg aches.  Feels better off statin. Continues zetia .

## 2024-04-05 NOTE — Assessment & Plan Note (Addendum)
 Has previously been on crestor .  Had muscle aches.  Feels better off.  Discussed other treatment options. Have discussed zetia  and repatha.  Had wanted to hold on repatha.  On zetia . Low cholesterol diet and exercise.  Follow lipid panel and liver function tests.  Check lipid panel today.

## 2024-04-05 NOTE — Assessment & Plan Note (Signed)
 Had f/u with neurology 03/17/24 - 06/20/23 - negative brain MRI. No brain atrophy. Offered speech therapy - for word finding difficulty. Also f/u numbness and tingling bilateral lower extremities. Recommended continuing gabapentin  and alpha lipoic acid 600mg  q day.

## 2024-04-05 NOTE — Progress Notes (Signed)
 Subjective:    Patient ID: Kristen Strickland, female    DOB: 11-23-1945, 78 y.o.   MRN: [id number]  Patient here for  Chief Complaint  Patient presents with   Medical Management of Chronic Issues    1 year follow up    HPI With past history of hypercholesterolemia and hypertension, she comes in today to follow up on these issues as well as for a complete physical exam. Had f/u with Dr Vonna Guardian 07/01/23 - f/u carotids - Carotid duplex demonstrated 1 to 39% right ICA stenosis and 40 to 59% left ICA stenosis without progression from previous study. Recommended continuing aspirin  therapy and recheck in one year. Had f/u with neurology 03/17/24 - 06/20/23 - negative brain MRI. No brain atrophy. Offered speech therapy - for word finding difficulty. Also f/u numbness and tingling bilateral lower extremities. Recommended continuing gabapentin  and alpha lipoic acid 600mg  q day. Is s/p right total shoulder arthroplasty for rotator cuff arthropathy. Had f/u 03/22/24 - with Dr Daun Epstein 03/22/24. Recommended to continue PT.  She is doing well with PT. Also working on hand strength. No chest pain or sob reported. GI issues are better, but still with some occasional discomfort. Has cut out artificial sweetener.    Past Medical History:  Diagnosis Date   Aortic atherosclerosis (HCC)    Arthritis    Carotid arterial disease (HCC)    a. 11/2017 Carotid U/S: RICA 1-39%, LICA 40-59%.    Chronic fatigue    Chronic heart failure with preserved ejection fraction (HCC)    Chronic leg pain    Complete tear of right rotator cuff    Coronary artery disease involving native coronary artery of native heart without angina pectoris    DDD (degenerative disc disease), lumbar    Dyspnea on exertion    a. 02/2017 Echo: EF 60-65%, no rwma, mild MR. Nl RV size/fxn. Nl PASP; b. 02/2017 MV: small, mild, fixed basal inferolateral defect - ? artifact vs scar. No ischemia. EF >65%.   Hypercholesterolemia    Hypertension    Insomnia     Long-term use of aspirin  therapy    Mild cognitive impairment    Palpitations    a. 02/2017 Holter: Avg HR 66 (51-115), rare PACs, four brief runs of PAT - up to 5 beats, max rate 157. Rare isolated PVCs. No sustained arrhythmias; b. 03/2018 24h Holter: Rare PACs, 3 beat run PAT (122 bpm), PVCs (2% of beats).   Radiculopathy, lumbar region    Sinus bradycardia    Skin cancer    Spondylolisthesis of lumbosacral region    Past Surgical History:  Procedure Laterality Date   ABDOMINAL EXPOSURE N/A 11/10/2018   Procedure: ABDOMINAL EXPOSURE;  Surgeon: Dannis Dy, MD;  Location: [facility] OR;  Service: Vascular;  Laterality: N/A;   ABDOMINAL HYSTERECTOMY  1975   ANTERIOR CERVICAL DECOMP/DISCECTOMY FUSION N/A 04/09/2013   Procedure: ANTERIOR CERVICAL DECOMPRESSION/DISCECTOMY FUSION 2 LEVELS;  Surgeon: Adelbert Adler, MD;  Location: [facility] OR[facility];  Service: Neurosurgery;  Laterality: N/A;  Cervical five-six Cervical six-seven  Anterior cervical decompression/diskectomy/fusion   ANTERIOR LUMBAR FUSION N/A 11/10/2018   Procedure: Anterior Lumbar Interbody Fusion-Lumbar five-Sacral one;  Surgeon: Agustina Aldrich, MD;  Location: [facility] OR;  Service: Neurosurgery;  Laterality: N/A;   BACK SURGERY     ruptured disc   RECONSTRUCTION OF NOSE     REVERSE SHOULDER ARTHROPLASTY Right 02/05/2024   Procedure: ARTHROPLASTY, SHOULDER, TOTAL, REVERSE;  Surgeon: Elner Hahn, MD;  Location: [facility] ORS;  Service: Orthopedics;  Laterality: Right;   Family History  Problem Relation Age of Onset   Cancer Mother        ovarian/uterine   Heart disease Mother    Heart attack Mother    Stroke Mother    Colon cancer Father    Dementia Father    COPD Brother    Congestive Heart Failure Brother    Stroke Daughter    Heart attack Daughter    Suicidality Brother    Breast cancer Neg Hx    Social History   Socioeconomic History   Marital status: Married    Spouse name: Not on file   Number of children: 2   Years of  education: Not on file   Highest education level: Not on file  Occupational History   Not on file  Tobacco Use   Smoking status: Never   Smokeless tobacco: Never  Vaping Use   Vaping status: Never Used  Substance and Sexual Activity   Alcohol use: Not Currently    Alcohol/week: 0.0 standard drinks of alcohol    Comment: rarely   Drug use: No   Sexual activity: Yes  Other Topics Concern   Not on file  Social History Narrative   ** Merged History Encounter **    Married   Social Drivers of Corporate investment banker Strain: Low Risk  (12/22/2023)   Received from [facility]   Overall Financial Resource Strain (CARDIA)    Difficulty of Paying Living Expenses: Not hard at all  Food Insecurity: No Food Insecurity (12/22/2023)   Received from [facility]   Hunger Vital Sign    Within the past 12 months, you worried that your food would run out before you got the money to buy more.: Never true    Within the past 12 months, the food you bought just didn't last and you didn't have money to get more.: Never true  Transportation Needs: No Transportation Needs (12/22/2023)   [facility] - Transportation    In the past 12 months, has lack of transportation kept you from medical appointments or from getting medications?: No    Lack of Transportation (Non-Medical): No  Physical Activity: Inactive (05/14/2023)   Exercise Vital Sign    Days of Exercise per Week: 0 days    Minutes of Exercise per Session: 0 min  Stress: No Stress Concern Present (05/14/2023)   [employer] of Occupational Health - Occupational Stress Questionnaire    Feeling of Stress : Only a little  Social Connections: Moderately Integrated (05/14/2023)   Social Connection and Isolation Panel    Frequency of Communication with Friends and Family: More than three times a week    Frequency of Social Gatherings with Friends and Family: More than  three times a week    Attends Religious Services: More than 4 times per year    Active Member of [employer] or Organizations: No    Attends Banker Meetings: Never    Marital Status: Married     Review of Systems  Constitutional:  Negative for appetite change and unexpected weight change.  HENT:  Negative for congestion, sinus pressure and sore throat.   Eyes:  Negative for pain and visual disturbance.  Respiratory:  Negative for cough, chest tightness and shortness of breath.   Cardiovascular:  Negative for chest pain, palpitations and leg swelling.  Gastrointestinal:  Negative for diarrhea, nausea and vomiting.  Occasional abdominal discomfort as outlined.   Genitourinary:  Negative for difficulty urinating and dysuria.  Musculoskeletal:  Negative for joint swelling and myalgias.  Skin:  Negative for color change and rash.  Neurological:  Negative for dizziness and headaches.  Hematological:  Negative for adenopathy. Does not bruise/bleed easily.  Psychiatric/Behavioral:  Negative for agitation and dysphoric mood.        Objective:     BP 118/70   Pulse 62   Temp 97.9 F (36.6 C)   Resp 16   Ht 5' 4 (1.626 m)   Wt 143 lb 12.8 oz (65.2 kg)   SpO2 98%   BMI 24.68 kg/m  Wt Readings from Last 3 Encounters:  04/05/24 143 lb 12.8 oz (65.2 kg)  02/05/24 147 lb (66.7 kg)  01/29/24 147 lb (66.7 kg)    Physical Exam Vitals reviewed.  Constitutional:      General: She is not in acute distress.    Appearance: Normal appearance.  HENT:     Head: Normocephalic and atraumatic.     Right Ear: External ear normal.     Left Ear: External ear normal.     Mouth/Throat:     Pharynx: No oropharyngeal exudate or posterior oropharyngeal erythema.   Eyes:     General: No scleral icterus.       Right eye: No discharge.        Left eye: No discharge.     Conjunctiva/sclera: Conjunctivae normal.   Neck:     Thyroid : No thyromegaly.   Cardiovascular:     Rate and  Rhythm: Normal rate and regular rhythm.  Pulmonary:     Effort: No respiratory distress.     Breath sounds: Normal breath sounds. No wheezing.     Comments: Declined breast exam. Abdominal:     General: Bowel sounds are normal.     Palpations: Abdomen is soft.     Tenderness: There is no abdominal tenderness.   Musculoskeletal:        General: No swelling or tenderness.     Cervical back: Neck supple. No tenderness.  Lymphadenopathy:     Cervical: No cervical adenopathy.   Skin:    Findings: No erythema or rash.     Comments: Thickened - skin - temple.    Neurological:     Mental Status: She is alert.   Psychiatric:        Mood and Affect: Mood normal.        Behavior: Behavior normal.         Outpatient Encounter Medications as of 04/05/2024  Medication Sig   acetaminophen  (TYLENOL ) 650 MG CR tablet Take 650 mg by mouth in the morning.   amLODipine  (NORVASC ) 2.5 MG tablet Take 1 tablet (2.5 mg total) by mouth daily with lunch.   aspirin  EC 81 MG tablet Take 81 mg by mouth daily with lunch.   ezetimibe  (ZETIA ) 10 MG tablet Take 1 tablet (10 mg total) by mouth at bedtime.   gabapentin  (NEURONTIN ) 100 MG capsule Take 1 capsule (100 mg total) by mouth at bedtime.   losartan -hydrochlorothiazide  (HYZAAR) 100-25 MG tablet Take 1 tablet by mouth daily with lunch.   meloxicam  (MOBIC ) 7.5 MG tablet Take 1 tablet (7.5 mg total) by mouth every other day as needed for pain (pain/stiffness).   Multiple Vitamins-Minerals (CENTRUM ADULTS PO) Take 1 capsule by mouth in the morning.   pantoprazole  (PROTONIX ) 40 MG tablet Take 1 tablet (40 mg total) by mouth daily.  Probiotic Product (PROBIOTIC PO) Take 1 capsule by mouth at bedtime.   SODIUM FLUORIDE 5000 PPM 1.1 % PSTE daily.   [DISCONTINUED] oxyCODONE  (ROXICODONE ) 5 MG immediate release tablet Take 1-2 tablets (5-10 mg total) by mouth every 4 (four) hours as needed for moderate pain (pain score 4-6) or severe pain (pain score 7-10).    No facility-administered encounter medications on file as of 04/05/2024.     Lab Results  Component Value Date   WBC 7.0 01/27/2024   HGB 12.3 01/27/2024   HCT 35.5 (L) 01/27/2024   PLT 278 01/27/2024   GLUCOSE 163 (H) 01/27/2024   CHOL 198 07/17/2023   TRIG 142.0 07/17/2023   HDL 56.00 07/17/2023   LDLDIRECT 151.7 08/16/2013   LDLCALC 114 (H) 07/17/2023   ALT 19 01/27/2024   AST 22 01/27/2024   NA 139 01/27/2024   K 3.4 (L) 01/27/2024   CL 102 01/27/2024   CREATININE 0.91 01/27/2024   BUN 19 01/27/2024   CO2 26 01/27/2024   TSH 3.79 08/08/2022   HGBA1C 5.9 07/17/2023    DG Shoulder Right Port Result Date: 02/05/2024 CLINICAL DATA:  Postop. EXAM: RIGHT SHOULDER - 1 VIEW COMPARISON:  None Available. FINDINGS: Reverse right shoulder arthroplasty in expected alignment. No periprosthetic lucency or fracture. Recent postsurgical change includes air and edema in the joint space and soft tissues. Overlying skin staples in place. IMPRESSION: Reverse right shoulder arthroplasty without immediate postoperative complication. Electronically Signed   By: Chadwick Colonel M.D.   On: 02/05/2024 13:06   US  OR NERVE BLOCK-IMAGE ONLY [facility]) Result Date: 02/05/2024 There is no interpretation for this exam.  This order is for images obtained during a surgical procedure.  Please See Surgeries Tab for more information regarding the procedure.       Assessment & Plan:  Health care maintenance Assessment & Plan: Physical today 04/05/24.  Declined breast exam.  Mammogram 01/15/24 - Birads I.  Need colonoscopy report.     Hyperlipidemia LDL goal <70 Assessment & Plan: Has previously been on crestor .  Had muscle aches.  Feels better off.  Discussed other treatment options. Have discussed zetia  and repatha.  Had wanted to hold on repatha.  On zetia . Low cholesterol diet and exercise.  Follow lipid panel and liver function tests.  Check lipid panel today.   Orders: -     TSH -     Basic  metabolic panel with GFR -     Hepatic function panel -     Lipid panel  Hyperglycemia Assessment & Plan: Low carb diet and exercise.  Follow met b and a1c. Check A1c today.   Orders: -     Hemoglobin A1c  Epigastric pain Assessment & Plan: Saw GI previously.  Discussed avoiding artificial sweeteners, mountain dew, etc.  She is avoiding artificial sweeteners. Still drinking 1-2 mountain dews. Is better. Discussed further evaluation. Wants to hold on any further testing.    Aortic atherosclerosis (HCC) Assessment & Plan: Intolerance to statin medication. Continue zetia .    Bilateral carotid artery disease, unspecified type [facility]) Assessment & Plan: Had f/u with Dr Vonna Guardian 07/01/23 - f/u carotids - Carotid duplex demonstrated 1 to 39% right ICA stenosis and 40 to 59% left ICA stenosis without progression from previous study. Recommended continuing aspirin  therapy and recheck in one year.    Chronic heart failure with preserved ejection fraction (HFpEF) (HCC) Assessment & Plan: Continues on losartan /hctz.  No evidence of volume overload on exam.  Breathing stable. Check metabolic  panel today    Senile purpura (HCC) Assessment & Plan: Recent cbc wnl. Follow    Primary hypertension Assessment & Plan: Blood pressure as outlined.  Continues on losartan /hctz and amlodipine .  Follow pressures. Check metabolic panel today.    Memory change Assessment & Plan: Had f/u with neurology 03/17/24 - 06/20/23 - negative brain MRI. No brain atrophy. Offered speech therapy - for word finding difficulty. Also f/u numbness and tingling bilateral lower extremities. Recommended continuing gabapentin  and alpha lipoic acid 600mg  q day.    Numbness and tingling of both lower extremities Assessment & Plan: Had f/u with neurology 03/17/24 - 06/20/23 - negative brain MRI. No brain atrophy. Offered speech therapy - for word finding difficulty. Also f/u numbness and tingling bilateral lower extremities.  Recommended continuing gabapentin  and alpha lipoic acid 600mg  q day.    Statin myopathy Assessment & Plan: Intolerant to crestor .  Leg aches.  Feels better off statin. Continues zetia .       Dellar Fenton, MD

## 2024-04-05 NOTE — Assessment & Plan Note (Addendum)
 Low carb diet and exercise.  Follow met b and a1c. Check A1c today.

## 2024-04-05 NOTE — Assessment & Plan Note (Signed)
 Continues on losartan /hctz.  No evidence of volume overload on exam.  Breathing stable. Check metabolic panel today

## 2024-04-05 NOTE — Assessment & Plan Note (Signed)
 Saw GI previously.  Discussed avoiding artificial sweeteners, mountain dew, etc.  She is avoiding artificial sweeteners. Still drinking 1-2 mountain dews. Is better. Discussed further evaluation. Wants to hold on any further testing.

## 2024-04-05 NOTE — Assessment & Plan Note (Signed)
 Blood pressure as outlined.  Continues on losartan /hctz and amlodipine .  Follow pressures. Check metabolic panel today.

## 2024-04-05 NOTE — Assessment & Plan Note (Signed)
 Intolerance to statin medication.  Continue zetia .

## 2024-04-05 NOTE — Assessment & Plan Note (Signed)
Had f/u with Dr Wyn Quaker 07/01/23 - f/u carotids - Carotid duplex demonstrated 1 to 39% right ICA stenosis and 40 to 59% left ICA stenosis without progression from previous study. Recommended continuing aspirin therapy and recheck in one year.

## 2024-04-06 DIAGNOSIS — M25511 Pain in right shoulder: Secondary | ICD-10-CM | POA: Diagnosis not present

## 2024-04-06 LAB — LIPID PANEL
Chol/HDL Ratio: 3.9 ratio (ref 0.0–4.4)
Cholesterol, Total: 211 mg/dL — ABNORMAL HIGH (ref 100–199)
HDL: 54 mg/dL (ref >39)
LDL Chol Calc (NIH): 132 mg/dL — ABNORMAL HIGH (ref 0–99)
Triglycerides: 139 mg/dL (ref 0–149)
VLDL Cholesterol Cal: 25 mg/dL (ref 5–40)

## 2024-04-06 LAB — BASIC METABOLIC PANEL WITH GFR
BUN/Creatinine Ratio: 15 (ref 12–28)
BUN: 11 mg/dL (ref 8–27)
CO2: 23 mmol/L (ref 20–29)
Calcium: 9.9 mg/dL (ref 8.7–10.3)
Chloride: 99 mmol/L (ref 96–106)
Creatinine, Ser: 0.74 mg/dL (ref 0.57–1.00)
Glucose: 98 mg/dL (ref 70–99)
Potassium: 4.2 mmol/L (ref 3.5–5.2)
Sodium: 136 mmol/L (ref 134–144)
eGFR: 84 mL/min/{1.73_m2} (ref >59)

## 2024-04-06 LAB — HEPATIC FUNCTION PANEL
ALT: 13 IU/L (ref 0–32)
AST: 13 IU/L (ref 0–40)
Albumin: 4 g/dL (ref 3.8–4.8)
Alkaline Phosphatase: 106 IU/L (ref 44–121)
Bilirubin Total: 0.7 mg/dL (ref 0.0–1.2)
Bilirubin, Direct: 0.18 mg/dL (ref 0.00–0.40)
Total Protein: 6.7 g/dL (ref 6.0–8.5)

## 2024-04-06 LAB — HEMOGLOBIN A1C
Est. average glucose Bld gHb Est-mCnc: 120 mg/dL
Hgb A1c MFr Bld: 5.8 % — ABNORMAL HIGH (ref 4.8–5.6)

## 2024-04-06 LAB — TSH: TSH: 4.55 u[IU]/mL — ABNORMAL HIGH (ref 0.450–4.500)

## 2024-04-07 ENCOUNTER — Ambulatory Visit: Payer: Self-pay | Admitting: Internal Medicine

## 2024-04-07 DIAGNOSIS — E0590 Thyrotoxicosis, unspecified without thyrotoxic crisis or storm: Secondary | ICD-10-CM

## 2024-04-07 MED ORDER — PRAVASTATIN SODIUM 10 MG PO TABS: 10.0000 mg | ORAL_TABLET | ORAL | 3 refills | Status: DC

## 2024-04-08 DIAGNOSIS — M25511 Pain in right shoulder: Secondary | ICD-10-CM | POA: Diagnosis not present

## 2024-04-12 ENCOUNTER — Ambulatory Visit: Payer: Self-pay | Admitting: *Deleted

## 2024-04-12 ENCOUNTER — Other Ambulatory Visit: Payer: Self-pay | Admitting: Internal Medicine

## 2024-04-12 DIAGNOSIS — R9349 Abnormal radiologic findings on diagnostic imaging of other urinary organs: Secondary | ICD-10-CM | POA: Diagnosis not present

## 2024-04-12 DIAGNOSIS — R109 Unspecified abdominal pain: Secondary | ICD-10-CM | POA: Diagnosis not present

## 2024-04-12 DIAGNOSIS — I2510 Atherosclerotic heart disease of native coronary artery without angina pectoris: Secondary | ICD-10-CM | POA: Diagnosis not present

## 2024-04-12 DIAGNOSIS — Z800 Family history of malignant neoplasm of digestive organs: Secondary | ICD-10-CM | POA: Diagnosis not present

## 2024-04-12 DIAGNOSIS — Z8049 Family history of malignant neoplasm of other genital organs: Secondary | ICD-10-CM | POA: Diagnosis not present

## 2024-04-12 DIAGNOSIS — N12 Tubulo-interstitial nephritis, not specified as acute or chronic: Secondary | ICD-10-CM | POA: Diagnosis not present

## 2024-04-12 DIAGNOSIS — K219 Gastro-esophageal reflux disease without esophagitis: Secondary | ICD-10-CM | POA: Diagnosis not present

## 2024-04-12 DIAGNOSIS — Z8041 Family history of malignant neoplasm of ovary: Secondary | ICD-10-CM | POA: Diagnosis not present

## 2024-04-12 DIAGNOSIS — F0280 Dementia in other diseases classified elsewhere without behavioral disturbance: Secondary | ICD-10-CM | POA: Diagnosis not present

## 2024-04-12 DIAGNOSIS — G309 Alzheimer's disease, unspecified: Secondary | ICD-10-CM | POA: Diagnosis not present

## 2024-04-12 DIAGNOSIS — Z833 Family history of diabetes mellitus: Secondary | ICD-10-CM | POA: Diagnosis not present

## 2024-04-12 DIAGNOSIS — I5032 Chronic diastolic (congestive) heart failure: Secondary | ICD-10-CM | POA: Diagnosis not present

## 2024-04-12 DIAGNOSIS — Z7982 Long term (current) use of aspirin: Secondary | ICD-10-CM | POA: Diagnosis not present

## 2024-04-12 DIAGNOSIS — Z96611 Presence of right artificial shoulder joint: Secondary | ICD-10-CM | POA: Diagnosis not present

## 2024-04-12 DIAGNOSIS — I110 Hypertensive heart disease with heart failure: Secondary | ICD-10-CM | POA: Diagnosis not present

## 2024-04-12 DIAGNOSIS — N390 Urinary tract infection, site not specified: Secondary | ICD-10-CM | POA: Diagnosis not present

## 2024-04-12 DIAGNOSIS — E785 Hyperlipidemia, unspecified: Secondary | ICD-10-CM | POA: Diagnosis not present

## 2024-04-12 DIAGNOSIS — Z90710 Acquired absence of both cervix and uterus: Secondary | ICD-10-CM | POA: Diagnosis not present

## 2024-04-12 DIAGNOSIS — Z825 Family history of asthma and other chronic lower respiratory diseases: Secondary | ICD-10-CM | POA: Diagnosis not present

## 2024-04-12 DIAGNOSIS — G629 Polyneuropathy, unspecified: Secondary | ICD-10-CM | POA: Diagnosis not present

## 2024-04-12 DIAGNOSIS — I6529 Occlusion and stenosis of unspecified carotid artery: Secondary | ICD-10-CM | POA: Diagnosis not present

## 2024-04-12 DIAGNOSIS — Z79899 Other long term (current) drug therapy: Secondary | ICD-10-CM | POA: Diagnosis not present

## 2024-04-12 DIAGNOSIS — R509 Fever, unspecified: Secondary | ICD-10-CM | POA: Diagnosis not present

## 2024-04-12 NOTE — Telephone Encounter (Signed)
 FYI Only or Action Required?: Action required by provider: request for appointment.  Patient was last seen in primary care on 04/05/2024 by Glendia Shad, MD. Called Nurse Triage reporting Back Pain. Symptoms began yesterday. Interventions attempted: OTC medications: only drank 3 glasses of water yesterday. Symptoms are: gradually worsening.  Triage Disposition: Go to ED Now (Notify PCP)  Patient/caregiver understands and will follow disposition?: Unsure                   Copied from CRM [id number]. Topic: Clinical - Red Word Triage >> Apr 12, 2024  8:27 AM Emylou G wrote: [facility] that prompted transfer to Nurse Triage: Red Word that prompted transfer to Nurse Triage: sick for 24 hours.. possible virus or uti.. chills, high fever (last night 102), pain in her back.. very weak - trying to be seen versus ER because she cannot sit very long Reason for Disposition  [1] SEVERE back pain (e.g., excruciating) AND [2] sudden onset AND [3] age > 60 years  Answer Assessment - Initial Assessment Questions 1. ONSET: When did the pain begin?      Saturday morning  2. LOCATION: Where does it hurt? (upper, mid or lower back)     Back pain 3. SEVERITY: How bad is the pain?  (e.g., Scale 1-10; mild, moderate, or severe)   - MILD (1-3): Doesn't interfere with normal activities.    - MODERATE (4-7): Interferes with normal activities or awakens from sleep.    - SEVERE (8-10): Excruciating pain, unable to do any normal activities.      Pain in back causing patient not to tolerate sitting up x 24 hours. Chills very weak fever 4. PATTERN: Is the pain constant? (e.g., yes, no; constant, intermittent)      Yes since last night  5. RADIATION: Does the pain shoot into your legs or somewhere else?     Na  6. CAUSE:  What do you think is causing the back pain?      Not sure possible virus or UTI 7. BACK OVERUSE:  Any recent lifting of heavy objects, strenuous work or exercise?     Na   8. MEDICINES: What have you taken so far for the pain? (e.g., nothing, acetaminophen , NSAIDS)     Nothing but will take motrin  9. NEUROLOGIC SYMPTOMS: Do you have any weakness, numbness, or problems with bowel/bladder control?     Na  10. OTHER SYMPTOMS: Do you have any other symptoms? (e.g., fever, abdomen pain, burning with urination, blood in urine)       Fever last night 102 , patient has gray appearance, chills, shaking, very weak can not tolerate sitting up . In bed since 5pm yesterday.  11. PREGNANCY: Is there any chance you are pregnant? When was your last menstrual period?       Na  Recommended ED to patient daughter who was caller and not with patient now. Daughter reports patient may decline ED or calling 911 if patient can not tolerate getting out of bed. Recommended ED due to sx of fever, worsening back pain and grayish in color. Recommended patient's daughter going back to patient and checking temp, VS and call back if needed. CAL notified daughter reports patient will probably declined ED and would like appt. Daughter requesting if PCP will call [facility] and notify patient coming if she can talk patient into going. Please advise.  Protocols used: Back Pain-A-AH

## 2024-04-12 NOTE — Telephone Encounter (Signed)
 FYI for you. They are at [facility]. They are keeping her overnight for observation. Spoke with daughter. Advised to let us  know if they need anything

## 2024-04-12 NOTE — Telephone Encounter (Signed)
 Per Dr Glendia, recommends ED

## 2024-04-12 NOTE — Telephone Encounter (Signed)
 Agree with evaluation today.  Please confirm evaluated. Per Sueanne conversation - going to [facility] for evaluation.

## 2024-04-12 NOTE — Telephone Encounter (Signed)
 Already addressed, see previous messages below:

## 2024-04-12 NOTE — Telephone Encounter (Signed)
 FYI   Called and spoke with daughter, patient is going to ED. Daughter is putting her in the car now. They will follow up after they are evaluated at the ED.

## 2024-04-16 ENCOUNTER — Telehealth: Payer: Self-pay

## 2024-04-16 NOTE — Transitions of Care (Post Inpatient/ED Visit) (Signed)
   04/16/2024  Name: Kristen Strickland MRN: [id number] DOB: August 26, 1946  Today's TOC FU Call Status: Today's TOC FU Call Status:: Unsuccessful Call (1st Attempt) Unsuccessful Call (1st Attempt) Date: 04/16/24  Attempted to reach the patient regarding the most recent Inpatient/ED visit.  Follow Up Plan: Additional outreach attempts will be made to reach the patient to complete the Transitions of Care (Post Inpatient/ED visit) call.   Medford Balboa, BSN, RN [place]  [facility] - [employer] [facility] RN Care Manager [phone]

## 2024-04-19 ENCOUNTER — Telehealth: Payer: Self-pay | Admitting: *Deleted

## 2024-04-19 NOTE — Transitions of Care (Post Inpatient/ED Visit) (Signed)
 04/19/2024  Name: Kristen Strickland MRN: [id number] DOB: [birth year]  Today's TOC FU Call Status: Today's TOC FU Call Status:: Successful TOC FU Call Completed TOC FU Call Complete Date: 04/19/24 Patient's Name and Date of Birth confirmed.  Transition Care Management Follow-up Telephone Call Date of Discharge: 04/15/24 Discharge Facility: Other [facility]) Name of Other (Non[facility]) Discharge Facility: [facility] Type of Discharge: Inpatient Admission Primary Inpatient Discharge Diagnosis:: pyelonephritis How have you been since you were released from the hospital?: Better Any questions or concerns?: No  Items Reviewed: Did you receive and understand the discharge instructions provided?: Yes Medications obtained,verified, and reconciled?: Yes (Medications Reviewed) Any new allergies since your discharge?: No Dietary orders reviewed?: No Do you have support at home?: Yes People in Home [RPT]: spouse Name of Support/Comfort Primary Source: [place]  Medications Reviewed Today: Medications Reviewed Today     Reviewed by Kennieth Cathlean DEL, RN (Case Manager) on 04/19/24 at 1539  Med List Status: <None>   Medication Order Taking? Sig Documenting Provider Last Dose Status Informant  acetaminophen  (TYLENOL ) 650 MG CR tablet [id number] Yes Take 650 mg by mouth in the morning. [provider]  Active Self  amLODipine  (NORVASC ) 2.5 MG tablet [id number] Yes TAKE ONE TABLET BY MOUTH ONCE DAILY Glendia Shad, MD  Active   aspirin  EC 81 MG tablet [id number] Yes Take 81 mg by mouth daily with lunch. [provider]  Active Self           Med Note ASUNCION SHARENE GORMAN Pablo Sep 11, 2015 11:32 AM)    ezetimibe  (ZETIA ) 10 MG tablet [id number] Yes Take 1 tablet (10 mg total) by mouth at bedtime. Poggi, Norleen PARAS, MD  Active   gabapentin  (NEURONTIN ) 100 MG capsule [id number] Yes TAKE ONE CAPSULE BY MOUTH EVERY DAY Glendia Shad, MD  Active   losartan -hydrochlorothiazide  (HYZAAR) 100-25 MG  tablet [id number] Yes Take 1 tablet by mouth daily with lunch. Poggi, Norleen PARAS, MD  Active   meloxicam  (MOBIC ) 7.5 MG tablet [id number] Yes Take 1 tablet (7.5 mg total) by mouth every other day as needed for pain (pain/stiffness). Poggi, Norleen PARAS, MD  Active   Multiple Vitamins-Minerals (CENTRUM ADULTS PO) [id number]  Take 1 capsule by mouth in the morning. [provider]  Active Self  pantoprazole  (PROTONIX ) 40 MG tablet [id number] Yes Take 1 tablet (40 mg total) by mouth daily. Glendia Shad, MD  Active Self  pravastatin  (PRAVACHOL ) 10 MG tablet [id number] Yes Take 1 tablet (10 mg total) by mouth every Monday, Wednesday, and Friday. Glendia Shad, MD  Active   Probiotic Product (PROBIOTIC PO) [id number] Yes Take 1 capsule by mouth at bedtime. [provider]  Active Self  SODIUM FLUORIDE 5000 PPM 1.1 % PSTE [id number] Yes daily. [provider]  Active             Home Care and Equipment/Supplies: Were Home Health Services Ordered?: NA Any new equipment or medical supplies ordered?: NA  Functional Questionnaire: Do you need assistance with bathing/showering or dressing?: Yes Do you need assistance with meal preparation?: No Do you need assistance with eating?: No Do you have difficulty maintaining continence: No Do you need assistance with getting out of bed/getting out of a chair/moving?: No Do you have difficulty managing or taking your medications?: No  Follow up appointments reviewed: PCP Follow-up appointment confirmed?: Yes Date of PCP follow-up appointment?: 04/26/24 Follow-up Provider: Dr Shad [facility] Follow-up appointment confirmed?: Yes Date of Specialist follow-up appointment?: 04/21/24  Follow-Up Specialty Provider:: Lynwood Level Do you need transportation to your follow-up appointment?: No Do you understand care options if your condition(s) worsen?: Yes-patient verbalized understanding  SDOH Interventions Today     Flowsheet Row Most Recent Value  SDOH Interventions   Food Insecurity Interventions Intervention Not Indicated  Housing Interventions Intervention Not Indicated  Transportation Interventions Intervention Not Indicated, Patient Resources (Friends/Family)  Utilities Interventions Intervention Not Indicated   The patient has been provided with contact information for the care management team and has been advised to call with any health-related questions or concerns. The patient verbalized understanding with current POC. The patient is directed to their insurance card regarding availability of benefits coverage    Cathlean Headland BSN RN [facility] Health Care Management Coordinator [email or web address] Direct Dial: [phone]  Fax: [phone] Website: [email or web address]

## 2024-04-19 NOTE — Telephone Encounter (Signed)
 HFU scheduled. See my chart. Talked with daughter.

## 2024-04-19 NOTE — Telephone Encounter (Signed)
 Per discussion - getting better. Will update us  within the next 24-48 hours.  Follow closely. Monitoring for work in.

## 2024-04-19 NOTE — Telephone Encounter (Signed)
 I have held an appt on Mon 04/26/24 @ 11:30 for a possible HFU for this patient if Daughter & Dr Glendia are both agreeable. Pt was discharged on 04/12/24. Disposition: follow-up with PCP in 7-10 days (See CRM below)  Copied from CRM [id number]. Topic: General - Other >> Apr 19, 2024 10:10 AM Thersia BROCKS wrote: Reason for CRM: Patient daughter called in regarding scheduling an hospital followup for patient, would like to know if she could be seen sometime this week, I informed patient that we didn't have anything sooner, they wanted to know if Dr.Scott could squeezed patient in to be seen

## 2024-04-20 DIAGNOSIS — M25511 Pain in right shoulder: Secondary | ICD-10-CM | POA: Diagnosis not present

## 2024-04-20 HISTORY — PX: OTHER SURGICAL HISTORY: SHX169

## 2024-04-21 ENCOUNTER — Telehealth: Payer: Self-pay

## 2024-04-21 DIAGNOSIS — N390 Urinary tract infection, site not specified: Secondary | ICD-10-CM | POA: Diagnosis not present

## 2024-04-21 NOTE — Telephone Encounter (Signed)
 Work in Advertising account executive as we discussed.

## 2024-04-21 NOTE — Telephone Encounter (Signed)
 Copied from CRM [id number]. Topic: Clinical - Medical Advice >> Apr 21, 2024  8:12 AM Sasha H wrote: Reason for CRM: Pt is wanting to know if she can come in and leave a urine sample today to see if her double kidney infection has went away.

## 2024-04-21 NOTE — Telephone Encounter (Signed)
 See my chart from daughter, do you want to work her in tomorrow?

## 2024-04-21 NOTE — Telephone Encounter (Signed)
 Pt scheduled

## 2024-04-22 ENCOUNTER — Ambulatory Visit: Admitting: Internal Medicine

## 2024-04-22 VITALS — BP 128/70 | HR 64 | Temp 97.9°F | Resp 16 | Ht 64.0 in | Wt 142.0 lb

## 2024-04-22 DIAGNOSIS — E785 Hyperlipidemia, unspecified: Secondary | ICD-10-CM | POA: Diagnosis not present

## 2024-04-22 DIAGNOSIS — R739 Hyperglycemia, unspecified: Secondary | ICD-10-CM

## 2024-04-22 DIAGNOSIS — D649 Anemia, unspecified: Secondary | ICD-10-CM

## 2024-04-22 DIAGNOSIS — I5032 Chronic diastolic (congestive) heart failure: Secondary | ICD-10-CM | POA: Diagnosis not present

## 2024-04-22 DIAGNOSIS — I700 Atherosclerosis of aorta: Secondary | ICD-10-CM | POA: Diagnosis not present

## 2024-04-22 DIAGNOSIS — M25511 Pain in right shoulder: Secondary | ICD-10-CM | POA: Diagnosis not present

## 2024-04-22 DIAGNOSIS — I10 Essential (primary) hypertension: Secondary | ICD-10-CM

## 2024-04-22 DIAGNOSIS — N12 Tubulo-interstitial nephritis, not specified as acute or chronic: Secondary | ICD-10-CM | POA: Diagnosis not present

## 2024-04-22 LAB — CBC WITH DIFFERENTIAL/PLATELET
Basophils Absolute: 0.1 10*3/uL (ref 0.0–0.1)
Basophils Relative: 1.5 % (ref 0.0–3.0)
Eosinophils Absolute: 0.1 10*3/uL (ref 0.0–0.7)
Eosinophils Relative: 1.9 % (ref 0.0–5.0)
HCT: 33.5 % — ABNORMAL LOW (ref 36.0–46.0)
Hemoglobin: 10.9 g/dL — ABNORMAL LOW (ref 12.0–15.0)
Lymphocytes Relative: 25 % (ref 12.0–46.0)
Lymphs Abs: 1.5 10*3/uL (ref 0.7–4.0)
MCHC: 32.7 g/dL (ref 30.0–36.0)
MCV: 84.9 fl (ref 78.0–100.0)
Monocytes Absolute: 0.4 10*3/uL (ref 0.1–1.0)
Monocytes Relative: 6.9 % (ref 3.0–12.0)
Neutro Abs: 3.9 10*3/uL (ref 1.4–7.7)
Neutrophils Relative %: 64.7 % (ref 43.0–77.0)
Platelets: 477 10*3/uL — ABNORMAL HIGH (ref 150.0–400.0)
RBC: 3.94 Mil/uL (ref 3.87–5.11)
RDW: 14 % (ref 11.5–15.5)
WBC: 6 10*3/uL (ref 4.0–10.5)

## 2024-04-22 LAB — BASIC METABOLIC PANEL WITH GFR
BUN: 17 mg/dL (ref 6–23)
CO2: 25 meq/L (ref 19–32)
Calcium: 9.8 mg/dL (ref 8.4–10.5)
Chloride: 101 meq/L (ref 96–112)
Creatinine, Ser: 1.05 mg/dL (ref 0.40–1.20)
GFR: 51.44 mL/min — ABNORMAL LOW (ref >60.00)
Glucose, Bld: 87 mg/dL (ref 70–99)
Potassium: 4.8 meq/L (ref 3.5–5.1)
Sodium: 134 meq/L — ABNORMAL LOW (ref 135–145)

## 2024-04-22 LAB — IBC + FERRITIN
Ferritin: 96.6 ng/mL (ref 10.0–291.0)
Iron: 60 ug/dL (ref 42–145)
Saturation Ratios: 16.5 % — ABNORMAL LOW (ref 20.0–50.0)
TIBC: 364 ug/dL (ref 250.0–450.0)
Transferrin: 260 mg/dL (ref 212.0–360.0)

## 2024-04-22 LAB — VITAMIN B12: Vitamin B-12: 533 pg/mL (ref 211–911)

## 2024-04-22 NOTE — Progress Notes (Signed)
 Subjective:    Patient ID: Kristen Strickland, female    DOB: May 28, 1946, 78 y.o.   MRN: [id number]  Patient here for  Chief Complaint  Patient presents with   Hospitalization Follow-up    HPI Here for a hospital follow up - admitted with back pain and fever - presented to ER 04/13/24. Admitted for uncomplicated bilateral pyelonephritis. Urine culture positive for E. Coli. CT abdomen pelvis was completed without contrast and was without evidence of urolithiasis but with stranding around bilateral kidneys concerning for pyelonephritis. Treated with IV abx. Blood pressure medications held during hospitalization. Was changed to oral abx. Completed yesterday. Reports she is doing better. Feels better. No chest pain or sob reported. Back on her blood pressure medication. No nausea or vomiting. No abdominal pain. No urinary symptoms.    Past Medical History:  Diagnosis Date   Aortic atherosclerosis (HCC)    Arthritis    Carotid arterial disease (HCC)    a. 11/2017 Carotid U/S: RICA 1-39%, LICA 40-59%.    Chronic fatigue    Chronic heart failure with preserved ejection fraction (HCC)    Chronic leg pain    Complete tear of right rotator cuff    Coronary artery disease involving native coronary artery of native heart without angina pectoris    DDD (degenerative disc disease), lumbar    Dyspnea on exertion    a. 02/2017 Echo: EF 60-65%, no rwma, mild MR. Nl RV size/fxn. Nl PASP; b. 02/2017 MV: small, mild, fixed basal inferolateral defect - ? artifact vs scar. No ischemia. EF >65%.   Hypercholesterolemia    Hypertension    Insomnia    Long-term use of aspirin  therapy    Mild cognitive impairment    Palpitations    a. 02/2017 Holter: Avg HR 66 (51-115), rare PACs, four brief runs of PAT - up to 5 beats, max rate 157. Rare isolated PVCs. No sustained arrhythmias; b. 03/2018 24h Holter: Rare PACs, 3 beat run PAT (122 bpm), PVCs (2% of beats).   Radiculopathy, lumbar region    Sinus bradycardia     Skin cancer    Spondylolisthesis of lumbosacral region    Past Surgical History:  Procedure Laterality Date   ABDOMINAL EXPOSURE N/A 11/10/2018   Procedure: ABDOMINAL EXPOSURE;  Surgeon: Eliza Lonni RAMAN, MD;  [facility];  Service: Vascular;  Laterality: N/A;   ABDOMINAL HYSTERECTOMY  1975   ANTERIOR CERVICAL DECOMP/DISCECTOMY FUSION N/A 04/09/2013   Procedure: ANTERIOR CERVICAL DECOMPRESSION/DISCECTOMY FUSION 2 LEVELS;  Surgeon: Catalina CHRISTELLA Stains, MD;  Location: [facility];  Service: Neurosurgery;  Laterality: N/A;  Cervical five-six Cervical six-seven  Anterior cervical decompression/diskectomy/fusion   ANTERIOR LUMBAR FUSION N/A 11/10/2018   Procedure: Anterior Lumbar Interbody Fusion-Lumbar five-Sacral one;  Surgeon: Louis Shove, MD;  Location: [facility] OR;  Service: Neurosurgery;  Laterality: N/A;   BACK SURGERY     ruptured disc   RECONSTRUCTION OF NOSE     REVERSE SHOULDER ARTHROPLASTY Right 02/05/2024   Procedure: ARTHROPLASTY, SHOULDER, TOTAL, REVERSE;  Surgeon: Edie Norleen PARAS, MD;  Location: [facility] ORS;  Service: Orthopedics;  Laterality: Right;   Family History  Problem Relation Age of Onset   Cancer Mother        ovarian/uterine   Heart disease Mother    Heart attack Mother    Stroke Mother    Colon cancer Father    Dementia Father    COPD Brother    Congestive Heart Failure Brother    Stroke Daughter  Heart attack Daughter    Suicidality Brother    Breast cancer Neg Hx    Social History   Socioeconomic History   Marital status: Married    Spouse name: Not on file   Number of children: 2   Years of education: Not on file   Highest education level: Not on file  Occupational History   Not on file  Tobacco Use   Smoking status: Never   Smokeless tobacco: Never  Vaping Use   Vaping status: Never Used  Substance and Sexual Activity   Alcohol use: Not Currently    Alcohol/week: 0.0 standard drinks of alcohol    Comment: rarely   Drug use: No   Sexual  activity: Yes  Other Topics Concern   Not on file  Social History Narrative   ** Merged History Encounter **    Married   Social Drivers of Corporate investment banker Strain: Low Risk  (04/14/2024)   Received from [facility]   Overall Financial Resource Strain (CARDIA)    Difficulty of Paying Living Expenses: Not hard at all  Food Insecurity: No Food Insecurity (04/19/2024)   Hunger Vital Sign    Worried About Running Out of Food in the Last Year: Never true    Ran Out of Food in the Last Year: Never true  Transportation Needs: No Transportation Needs (04/19/2024)   PRAPARE - Administrator, Civil Service (Medical): No    Lack of Transportation (Non-Medical): No  Physical Activity: Inactive (05/14/2023)   Exercise Vital Sign    Days of Exercise per Week: 0 days    Minutes of Exercise per Session: 0 min  Stress: No Stress Concern Present (05/14/2023)   [employer] of Occupational Health - Occupational Stress Questionnaire    Feeling of Stress : Only a little  Social Connections: Moderately Integrated (05/14/2023)   Social Connection and Isolation Panel    Frequency of Communication with Friends and Family: More than three times a week    Frequency of Social Gatherings with Friends and Family: More than three times a week    Attends Religious Services: More than 4 times per year    Active Member of [employer] or Organizations: No    Attends Banker Meetings: Never    Marital Status: Married     Review of Systems  Constitutional:  Negative for appetite change and fever.  HENT:  Negative for congestion and sinus pressure.   Respiratory:  Negative for cough, chest tightness and shortness of breath.   Cardiovascular:  Negative for chest pain, palpitations and leg swelling.  Gastrointestinal:  Negative for abdominal pain, diarrhea, nausea and vomiting.  Genitourinary:  Negative for difficulty urinating and dysuria.  Musculoskeletal:   Negative for joint swelling and myalgias.  Skin:  Negative for color change and rash.  Neurological:  Negative for dizziness and headaches.  Psychiatric/Behavioral:  Negative for agitation and dysphoric mood.        Objective:     BP 128/70   Pulse 64   Temp 97.9 F (36.6 C)   Resp 16   Ht 5' 4 (1.626 m)   Wt 142 lb (64.4 kg)   SpO2 98%   BMI 24.37 kg/m  Wt Readings from Last 3 Encounters:  04/22/24 142 lb (64.4 kg)  04/05/24 143 lb 12.8 oz (65.2 kg)  02/05/24 147 lb (66.7 kg)    Physical Exam Vitals reviewed.  Constitutional:      General:  She is not in acute distress.    Appearance: Normal appearance.  HENT:     Head: Normocephalic and atraumatic.     Right Ear: External ear normal.     Left Ear: External ear normal.     Mouth/Throat:     Pharynx: No oropharyngeal exudate or posterior oropharyngeal erythema.  Eyes:     General: No scleral icterus.       Right eye: No discharge.        Left eye: No discharge.     Conjunctiva/sclera: Conjunctivae normal.  Neck:     Thyroid : No thyromegaly.  Cardiovascular:     Rate and Rhythm: Normal rate and regular rhythm.  Pulmonary:     Effort: No respiratory distress.     Breath sounds: Normal breath sounds. No wheezing.  Abdominal:     General: Bowel sounds are normal.     Palpations: Abdomen is soft.     Tenderness: There is no abdominal tenderness.  Musculoskeletal:        General: No swelling or tenderness.     Cervical back: Neck supple. No tenderness.  Lymphadenopathy:     Cervical: No cervical adenopathy.  Skin:    Findings: No erythema or rash.  Neurological:     Mental Status: She is alert.  Psychiatric:        Mood and Affect: Mood normal.        Behavior: Behavior normal.         Outpatient Encounter Medications as of 04/22/2024  Medication Sig   acetaminophen  (TYLENOL ) 650 MG CR tablet Take 650 mg by mouth in the morning.   amLODipine  (NORVASC ) 2.5 MG tablet TAKE ONE TABLET BY MOUTH ONCE DAILY    aspirin  EC 81 MG tablet Take 81 mg by mouth daily with lunch.   ezetimibe  (ZETIA ) 10 MG tablet Take 1 tablet (10 mg total) by mouth at bedtime.   gabapentin  (NEURONTIN ) 100 MG capsule TAKE ONE CAPSULE BY MOUTH EVERY DAY   losartan -hydrochlorothiazide  (HYZAAR) 100-25 MG tablet Take 1 tablet by mouth daily with lunch.   meloxicam  (MOBIC ) 7.5 MG tablet Take 1 tablet (7.5 mg total) by mouth every other day as needed for pain (pain/stiffness).   Multiple Vitamins-Minerals (CENTRUM ADULTS PO) Take 1 capsule by mouth in the morning.   pantoprazole  (PROTONIX ) 40 MG tablet Take 1 tablet (40 mg total) by mouth daily.   pravastatin  (PRAVACHOL ) 10 MG tablet Take 1 tablet (10 mg total) by mouth every Monday, Wednesday, and Friday.   Probiotic Product (PROBIOTIC PO) Take 1 capsule by mouth at bedtime.   SODIUM FLUORIDE 5000 PPM 1.1 % PSTE daily.   [DISCONTINUED] sulfamethoxazole-trimethoprim (BACTRIM DS) 800-160 MG tablet Take 1 tablet by mouth 2 (two) times daily.   No facility-administered encounter medications on file as of 04/22/2024.     Lab Results  Component Value Date   WBC 6.0 04/22/2024   HGB 10.9 (L) 04/22/2024   HCT 33.5 (L) 04/22/2024   PLT 477.0 (H) 04/22/2024   GLUCOSE 87 04/22/2024   CHOL 211 (H) 04/05/2024   TRIG 139 04/05/2024   HDL 54 04/05/2024   LDLDIRECT 151.7 08/16/2013   LDLCALC 132 (H) 04/05/2024   ALT 13 04/05/2024   AST 13 04/05/2024   NA 134 (L) 04/22/2024   K 4.8 04/22/2024   CL 101 04/22/2024   CREATININE 1.05 04/22/2024   BUN 17 04/22/2024   CO2 25 04/22/2024   TSH 4.550 (H) 04/05/2024   HGBA1C 5.8 (H) 04/05/2024  DG Shoulder Right Port Result Date: 02/05/2024 CLINICAL DATA:  Postop. EXAM: RIGHT SHOULDER - 1 VIEW COMPARISON:  None Available. FINDINGS: Reverse right shoulder arthroplasty in expected alignment. No periprosthetic lucency or fracture. Recent postsurgical change includes air and edema in the joint space and soft tissues. Overlying skin staples  in place. IMPRESSION: Reverse right shoulder arthroplasty without immediate postoperative complication. Electronically Signed   By: Andrea Gasman M.D.   On: 02/05/2024 13:06   US  OR NERVE BLOCK-IMAGE ONLY [facility]) Result Date: 02/05/2024 There is no interpretation for this exam.  This order is for images obtained during a surgical procedure.  Please See Surgeries Tab for more information regarding the procedure.       Assessment & Plan:  Primary hypertension Assessment & Plan: Back on losartan /hydrochlorothiazide  and amlodipine . Blood pressure as outlined. Restarted medication after hospitalization. Continue current medication regimen. Follow pressures. Follow metabolic panel.   Orders: -     Basic metabolic panel with GFR  Hyperlipidemia LDL goal <70 Assessment & Plan: Has previously been on crestor .  Had muscle aches.  Continues on zetia .    Hyperglycemia Assessment & Plan: Follow met b and A1c. Stay hydrated.    Anemia, unspecified type Assessment & Plan: Hgb decreased during recent hospitalization. Feeling better. Eating. Check cbc, iron studies and B12 today.   Orders: -     CBC with Differential/Platelet -     Vitamin B12 -     IBC + Ferritin  Aortic atherosclerosis (HCC) Assessment & Plan: Continue zetia . Intolerance to statin medication.    Chronic heart failure with preserved ejection fraction (HFpEF) (HCC) Assessment & Plan: Continues on losartan /hctz.  Has restarted since hospitalization. No evidence of volume overload on exam.  Breathing stable. Check metabolic panel today.    Pyelonephritis Assessment & Plan: Recent hospitalization as outined.  Has completed oral abx. Stay hydrated. Currently feeling better. Eating. Check cbc and metabolic panel today to follow up abnormalities noted during hospitalization.       Allena Hamilton, MD

## 2024-04-24 ENCOUNTER — Encounter: Payer: Self-pay | Admitting: Internal Medicine

## 2024-04-24 ENCOUNTER — Ambulatory Visit: Payer: Self-pay | Admitting: Internal Medicine

## 2024-04-24 DIAGNOSIS — N12 Tubulo-interstitial nephritis, not specified as acute or chronic: Secondary | ICD-10-CM | POA: Insufficient documentation

## 2024-04-24 NOTE — Assessment & Plan Note (Signed)
 Recent hospitalization as outined.  Has completed oral abx. Stay hydrated. Currently feeling better. Eating. Check cbc and metabolic panel today to follow up abnormalities noted during hospitalization.

## 2024-04-24 NOTE — Assessment & Plan Note (Signed)
 Continue zetia . Intolerance to statin medication.

## 2024-04-24 NOTE — Assessment & Plan Note (Signed)
 Back on losartan /hydrochlorothiazide  and amlodipine . Blood pressure as outlined. Restarted medication after hospitalization. Continue current medication regimen. Follow pressures. Follow metabolic panel.

## 2024-04-24 NOTE — Assessment & Plan Note (Signed)
 Continues on losartan /hctz.  Has restarted since hospitalization. No evidence of volume overload on exam.  Breathing stable. Check metabolic panel today.

## 2024-04-24 NOTE — Assessment & Plan Note (Signed)
 Follow met b and A1c. Stay hydrated.

## 2024-04-24 NOTE — Assessment & Plan Note (Signed)
 Has previously been on crestor .  Had muscle aches.  Continues on zetia .

## 2024-04-24 NOTE — Assessment & Plan Note (Signed)
 Hgb decreased during recent hospitalization. Feeling better. Eating. Check cbc, iron studies and B12 today.

## 2024-04-26 ENCOUNTER — Inpatient Hospital Stay: Admitting: Internal Medicine

## 2024-04-27 DIAGNOSIS — M25511 Pain in right shoulder: Secondary | ICD-10-CM | POA: Diagnosis not present

## 2024-04-30 DIAGNOSIS — M25511 Pain in right shoulder: Secondary | ICD-10-CM | POA: Diagnosis not present

## 2024-05-04 ENCOUNTER — Other Ambulatory Visit: Payer: Self-pay

## 2024-05-04 ENCOUNTER — Other Ambulatory Visit (INDEPENDENT_AMBULATORY_CARE_PROVIDER_SITE_OTHER)

## 2024-05-04 DIAGNOSIS — I10 Essential (primary) hypertension: Secondary | ICD-10-CM

## 2024-05-04 DIAGNOSIS — D649 Anemia, unspecified: Secondary | ICD-10-CM

## 2024-05-04 DIAGNOSIS — E0590 Thyrotoxicosis, unspecified without thyrotoxic crisis or storm: Secondary | ICD-10-CM

## 2024-05-04 DIAGNOSIS — M25511 Pain in right shoulder: Secondary | ICD-10-CM | POA: Diagnosis not present

## 2024-05-04 DIAGNOSIS — E785 Hyperlipidemia, unspecified: Secondary | ICD-10-CM

## 2024-05-04 DIAGNOSIS — R739 Hyperglycemia, unspecified: Secondary | ICD-10-CM

## 2024-05-04 LAB — CBC WITH DIFFERENTIAL/PLATELET
Basophils Absolute: 0 K/uL (ref 0.0–0.1)
Basophils Relative: 1.2 % (ref 0.0–3.0)
Eosinophils Absolute: 0.1 K/uL (ref 0.0–0.7)
Eosinophils Relative: 2.1 % (ref 0.0–5.0)
HCT: 33.3 % — ABNORMAL LOW (ref 36.0–46.0)
Hemoglobin: 11 g/dL — ABNORMAL LOW (ref 12.0–15.0)
Lymphocytes Relative: 25.7 % (ref 12.0–46.0)
Lymphs Abs: 1.1 K/uL (ref 0.7–4.0)
MCHC: 32.9 g/dL (ref 30.0–36.0)
MCV: 85.2 fl (ref 78.0–100.0)
Monocytes Absolute: 0.3 K/uL (ref 0.1–1.0)
Monocytes Relative: 7.1 % (ref 3.0–12.0)
Neutro Abs: 2.6 K/uL (ref 1.4–7.7)
Neutrophils Relative %: 63.9 % (ref 43.0–77.0)
Platelets: 238 K/uL (ref 150.0–400.0)
RBC: 3.9 Mil/uL (ref 3.87–5.11)
RDW: 14.6 % (ref 11.5–15.5)
WBC: 4.1 K/uL (ref 4.0–10.5)

## 2024-05-04 LAB — BASIC METABOLIC PANEL WITH GFR
BUN: 16 mg/dL (ref 6–23)
CO2: 30 meq/L (ref 19–32)
Calcium: 9.4 mg/dL (ref 8.4–10.5)
Chloride: 102 meq/L (ref 96–112)
Creatinine, Ser: 0.63 mg/dL (ref 0.40–1.20)
GFR: 85.8 mL/min (ref >60.00)
Glucose, Bld: 105 mg/dL — ABNORMAL HIGH (ref 70–99)
Potassium: 4.1 meq/L (ref 3.5–5.1)
Sodium: 138 meq/L (ref 135–145)

## 2024-05-04 NOTE — Addendum Note (Signed)
 Addended by: TANDA HARVEY D on: 05/04/2024 10:00 AM   Modules accepted: Orders

## 2024-05-05 ENCOUNTER — Ambulatory Visit: Payer: Self-pay | Admitting: Internal Medicine

## 2024-05-05 ENCOUNTER — Other Ambulatory Visit: Payer: Self-pay

## 2024-05-05 DIAGNOSIS — D649 Anemia, unspecified: Secondary | ICD-10-CM

## 2024-05-06 DIAGNOSIS — D0362 Melanoma in situ of left upper limb, including shoulder: Secondary | ICD-10-CM | POA: Diagnosis not present

## 2024-05-06 DIAGNOSIS — L814 Other melanin hyperpigmentation: Secondary | ICD-10-CM | POA: Diagnosis not present

## 2024-05-06 DIAGNOSIS — Z85828 Personal history of other malignant neoplasm of skin: Secondary | ICD-10-CM | POA: Diagnosis not present

## 2024-05-06 DIAGNOSIS — D485 Neoplasm of uncertain behavior of skin: Secondary | ICD-10-CM | POA: Diagnosis not present

## 2024-05-06 DIAGNOSIS — L821 Other seborrheic keratosis: Secondary | ICD-10-CM | POA: Diagnosis not present

## 2024-05-06 DIAGNOSIS — D0471 Carcinoma in situ of skin of right lower limb, including hip: Secondary | ICD-10-CM | POA: Diagnosis not present

## 2024-05-06 DIAGNOSIS — L570 Actinic keratosis: Secondary | ICD-10-CM | POA: Diagnosis not present

## 2024-05-06 DIAGNOSIS — D225 Melanocytic nevi of trunk: Secondary | ICD-10-CM | POA: Diagnosis not present

## 2024-05-06 DIAGNOSIS — M25511 Pain in right shoulder: Secondary | ICD-10-CM | POA: Diagnosis not present

## 2024-05-06 DIAGNOSIS — D692 Other nonthrombocytopenic purpura: Secondary | ICD-10-CM | POA: Diagnosis not present

## 2024-05-06 DIAGNOSIS — L578 Other skin changes due to chronic exposure to nonionizing radiation: Secondary | ICD-10-CM | POA: Diagnosis not present

## 2024-05-11 ENCOUNTER — Telehealth: Payer: Self-pay | Admitting: Internal Medicine

## 2024-05-11 NOTE — Telephone Encounter (Signed)
 Copied from CRM [id number]. Topic: General - Other >> May 11, 2024 11:53 AM Martinique E wrote: Reason for CRM: Patient called in asking if she still needs her lab appointment on July 28th when she already has one scheduled for August 28th. She would rather keep the 8/28. Callback number for patient is [phone].

## 2024-05-11 NOTE — Telephone Encounter (Signed)
 She is scheduled for a cbc August 28 and a TSH July 28. She wants to know if these can be combined?

## 2024-05-11 NOTE — Telephone Encounter (Signed)
 Ok to add tsh to the 05/2024 lab and cancel the 05/17/24 lab. Will need to make note on the 05/2027 lab appt is a tsh and a cbc lab (not a cbc only).

## 2024-05-11 NOTE — Telephone Encounter (Signed)
 Patient aware. July appt cancelled.

## 2024-05-13 DIAGNOSIS — M25511 Pain in right shoulder: Secondary | ICD-10-CM | POA: Diagnosis not present

## 2024-05-14 ENCOUNTER — Other Ambulatory Visit: Payer: Self-pay | Admitting: Internal Medicine

## 2024-05-14 DIAGNOSIS — Z96611 Presence of right artificial shoulder joint: Secondary | ICD-10-CM | POA: Diagnosis not present

## 2024-05-14 DIAGNOSIS — M25531 Pain in right wrist: Secondary | ICD-10-CM | POA: Diagnosis not present

## 2024-05-14 DIAGNOSIS — G5601 Carpal tunnel syndrome, right upper limb: Secondary | ICD-10-CM | POA: Diagnosis not present

## 2024-05-14 MED ORDER — PRAVASTATIN SODIUM 10 MG PO TABS: 10.0000 mg | ORAL_TABLET | ORAL | 2 refills | Status: DC

## 2024-05-14 NOTE — Telephone Encounter (Signed)
 Copied from CRM [id number]. Topic: Clinical - Medication Refill >> May 14, 2024 11:37 AM Sophia H wrote: Medication: pravastatin  (PRAVACHOL ) 10 MG tablet   Has the patient contacted their pharmacy? Yes, told to contact office   This is the patient's preferred pharmacy:  [facility] - [place], [place] - [address] [place] [address] Phone: [phone] Fax: [phone]  Is this the correct pharmacy for this prescription? Yes If no, delete pharmacy and type the correct one.   Has the prescription been filled recently? Yes  Is the patient out of the medication? Yes, taking last tablet tonight   Has the patient been seen for an appointment in the last year OR does the patient have an upcoming appointment? Yes, seen July 3  Can we respond through MyChart? No, please reach out by phone to cell phone  Agent: Please be advised that Rx refills may take up to 3 business days. We ask that you follow-up with your pharmacy.

## 2024-05-17 ENCOUNTER — Other Ambulatory Visit

## 2024-05-18 ENCOUNTER — Ambulatory Visit (INDEPENDENT_AMBULATORY_CARE_PROVIDER_SITE_OTHER): Payer: Medicare Other | Admitting: *Deleted

## 2024-05-18 VITALS — BP 122/62 | Ht 64.0 in | Wt 143.0 lb

## 2024-05-18 DIAGNOSIS — Z0000 Encounter for general adult medical examination without abnormal findings: Secondary | ICD-10-CM

## 2024-05-18 NOTE — Progress Notes (Signed)
 Subjective:   Kristen Strickland is a 78 y.o. who presents for a Medicare Wellness preventive visit.  As a reminder, Annual Wellness Visits don't include a physical exam, and some assessments may be limited, especially if this visit is performed virtually. We may recommend an in-person follow-up visit with your provider if needed.  Visit Complete: Virtual I connected with  Reena SHAUNNA Arts on 05/18/24 by a audio enabled telemedicine application and verified that I am speaking with the correct person using two identifiers.  Patient Location: Home  Provider Location: Home Office  I discussed the limitations of evaluation and management by telemedicine. The patient expressed understanding and agreed to proceed.  Vital Signs: Because this visit was a virtual/telehealth visit, some criteria may be missing or patient reported. Any vitals not documented were not able to be obtained and vitals that have been documented are patient reported.  VideoDeclined- This patient declined Librarian, academic. Therefore the visit was completed with audio only.  Persons Participating in Visit: Patient.  AWV Questionnaire: No: Patient Medicare AWV questionnaire was not completed prior to this visit.  Cardiac Risk Factors include: advanced age (>39men, >27 women);dyslipidemia;hypertension;Other (see comment), Risk factor comments: CAD     Objective:    Today's Vitals   05/18/24 1300  BP: 122/62  Weight: 143 lb (64.9 kg)  Height: 5' 4 (1.626 m)   Body mass index is 24.55 kg/m.     05/18/2024    1:20 PM 02/05/2024    6:23 AM 01/27/2024    1:19 PM 05/14/2023    2:53 PM 05/10/2022    2:39 PM 02/10/2022    1:58 PM 04/18/2021    3:06 PM  Advanced Directives  Does Patient Have a Medical Advance Directive? No No No No No No No  Would patient like information on creating a medical advance directive? No - Patient declined  No - Patient declined  No - Patient declined No - Patient  declined No - Patient declined    Current Medications (verified) Outpatient Encounter Medications as of 05/18/2024  Medication Sig   acetaminophen  (TYLENOL ) 650 MG CR tablet Take 650 mg by mouth in the morning.   amLODipine  (NORVASC ) 2.5 MG tablet TAKE ONE TABLET BY MOUTH ONCE DAILY   aspirin  EC 81 MG tablet Take 81 mg by mouth daily with lunch.   ezetimibe  (ZETIA ) 10 MG tablet Take 1 tablet (10 mg total) by mouth at bedtime.   gabapentin  (NEURONTIN ) 100 MG capsule TAKE ONE CAPSULE BY MOUTH EVERY DAY   losartan -hydrochlorothiazide  (HYZAAR) 100-25 MG tablet Take 1 tablet by mouth daily with lunch.   meloxicam  (MOBIC ) 7.5 MG tablet Take 1 tablet (7.5 mg total) by mouth every other day as needed for pain (pain/stiffness).   Multiple Vitamins-Minerals (CENTRUM ADULTS PO) Take 1 capsule by mouth in the morning.   pantoprazole  (PROTONIX ) 40 MG tablet Take 1 tablet (40 mg total) by mouth daily.   pravastatin  (PRAVACHOL ) 10 MG tablet Take 1 tablet (10 mg total) by mouth every Monday, Wednesday, and Friday.   SODIUM FLUORIDE 5000 PPM 1.1 % PSTE daily. (Patient taking differently: daily as needed.)   Probiotic Product (PROBIOTIC PO) Take 1 capsule by mouth at bedtime. (Patient not taking: Reported on 05/18/2024)   No facility-administered encounter medications on file as of 05/18/2024.    Allergies (verified) Patient has no known allergies.   History: Past Medical History:  Diagnosis Date   Aortic atherosclerosis (HCC)    Arthritis  Carotid arterial disease (HCC)    a. 11/2017 Carotid U/S: RICA 1-39%, LICA 40-59%.    Chronic fatigue    Chronic heart failure with preserved ejection fraction (HCC)    Chronic leg pain    Complete tear of right rotator cuff    Coronary artery disease involving native coronary artery of native heart without angina pectoris    DDD (degenerative disc disease), lumbar    Dyspnea on exertion    a. 02/2017 Echo: EF 60-65%, no rwma, mild MR. Nl RV size/fxn. Nl PASP;  b. 02/2017 MV: small, mild, fixed basal inferolateral defect - ? artifact vs scar. No ischemia. EF >65%.   Hypercholesterolemia    Hypertension    Insomnia    Long-term use of aspirin  therapy    Mild cognitive impairment    Palpitations    a. 02/2017 Holter: Avg HR 66 (51-115), rare PACs, four brief runs of PAT - up to 5 beats, max rate 157. Rare isolated PVCs. No sustained arrhythmias; b. 03/2018 24h Holter: Rare PACs, 3 beat run PAT (122 bpm), PVCs (2% of beats).   Radiculopathy, lumbar region    Sinus bradycardia    Skin cancer    Spondylolisthesis of lumbosacral region    Past Surgical History:  Procedure Laterality Date   ABDOMINAL EXPOSURE N/A 11/10/2018   Procedure: ABDOMINAL EXPOSURE;  Surgeon: Eliza Lonni RAMAN, MD;  Location[facility] OR;  Service: Vascular;  Laterality: N/A;   ABDOMINAL HYSTERECTOMY  10/21/1973   ANTERIOR CERVICAL DECOMP/DISCECTOMY FUSION N/A 04/09/2013   Procedure: ANTERIOR CERVICAL DECOMPRESSION/DISCECTOMY FUSION 2 LEVELS;  Surgeon: Catalina CHRISTELLA Stains, MD;  Location: [facility];  Service: Neurosurgery;  Laterality: N/A;  Cervical five-six Cervical six-seven  Anterior cervical decompression/diskectomy/fusion   ANTERIOR LUMBAR FUSION N/A 11/10/2018   Procedure: Anterior Lumbar Interbody Fusion-Lumbar five-Sacral one;  Surgeon: Louis Shove, MD;  Location: [facility] OR;  Service: Neurosurgery;  Laterality: N/A;   BACK SURGERY     ruptured disc   RECONSTRUCTION OF NOSE     REVERSE SHOULDER ARTHROPLASTY Right 02/05/2024   Procedure: ARTHROPLASTY, SHOULDER, TOTAL, REVERSE;  Surgeon: Edie Norleen PARAS, MD;  Location: [facility] ORS;  Service: Orthopedics;  Laterality: Right;   skin lesion removed Left 04/2024   left arm   Family History  Problem Relation Age of Onset   Cancer Mother        ovarian/uterine   Heart disease Mother    Heart attack Mother    Stroke Mother    Colon cancer Father    Dementia Father    COPD Brother    Congestive Heart Failure Brother    Stroke  Daughter    Heart attack Daughter    Suicidality Brother    Breast cancer Neg Hx    Social History   Socioeconomic History   Marital status: Married    Spouse name: Not on file   Number of children: 2   Years of education: Not on file   Highest education level: Not on file  Occupational History   Not on file  Tobacco Use   Smoking status: Never   Smokeless tobacco: Never  Vaping Use   Vaping status: Never Used  Substance and Sexual Activity   Alcohol use: Not Currently    Alcohol/week: 0.0 standard drinks of alcohol    Comment: rarely   Drug use: No   Sexual activity: Yes  Other Topics Concern   Not on file  Social History Narrative   ** Merged History Encounter **  Married   Social Drivers of Corporate investment banker Strain: Low Risk  (05/18/2024)   Overall Financial Resource Strain (CARDIA)    Difficulty of Paying Living Expenses: Not hard at all  Food Insecurity: No Food Insecurity (05/18/2024)   Hunger Vital Sign    Worried About Running Out of Food in the Last Year: Never true    Ran Out of Food in the Last Year: Never true  Transportation Needs: No Transportation Needs (05/18/2024)   PRAPARE - Administrator, Civil Service (Medical): No    Lack of Transportation (Non-Medical): No  Physical Activity: Inactive (05/18/2024)   Exercise Vital Sign    Days of Exercise per Week: 0 days    Minutes of Exercise per Session: 0 min  Stress: No Stress Concern Present (05/18/2024)   [employer] of Occupational Health - Occupational Stress Questionnaire    Feeling of Stress: Only a little  Social Connections: Socially Integrated (05/18/2024)   Social Connection and Isolation Panel    Frequency of Communication with Friends and Family: More than three times a week    Frequency of Social Gatherings with Friends and Family: More than three times a week    Attends Religious Services: More than 4 times per year    Active Member of [employer] or Organizations: Yes     Attends Banker Meetings: Never    Marital Status: Married    Tobacco Counseling Counseling given: Not Answered    Clinical Intake:  Pre-visit preparation completed: Yes  Pain : No/denies pain     BMI - recorded: 24.55 Nutritional Status: BMI of 19-24  Normal Nutritional Risks: None Diabetes: No  Lab Results  Component Value Date   HGBA1C 5.8 (H) 04/05/2024   HGBA1C 5.9 07/17/2023   HGBA1C 5.9 03/18/2023     How often do you need to have someone help you when you read instructions, pamphlets, or other written materials from your doctor or pharmacy?: 1 - Never  Interpreter Needed?: No  Information entered by :: R. Kelijah Towry LPN   Activities of Daily Living     05/18/2024    1:02 PM 01/27/2024    1:25 PM  In your present state of health, do you have any difficulty performing the following activities:  Hearing? 0   Vision? 0   Difficulty concentrating or making decisions? 1   Walking or climbing stairs? 0   Dressing or bathing? 0   Doing errands, shopping? 0 0  Preparing Food and eating ? N   Using the Toilet? N   In the past six months, have you accidently leaked urine? Y   Do you have problems with loss of bowel control? N   Managing your Medications? N   Managing your Finances? N   Housekeeping or managing your Housekeeping? N     Patient Care Team: Glendia Shad, MD as PCP - General (Internal Medicine) End, Lonni, MD as PCP - Cardiology (Cardiology) Louis Shove, MD as Consulting Physician (Neurosurgery)  I have updated your Care Teams any recent Medical Services you may have received from other providers in the past year.     Assessment:   This is a routine wellness examination for Alaney.  Hearing/Vision screen Hearing Screening - Comments:: No issues Vision Screening - Comments:: glasses   Goals Addressed             This Visit's Progress    Patient Stated       Wants to work  on getting her energy level up        Depression Screen     05/18/2024    1:12 PM 04/19/2024    3:49 PM 05/14/2023    2:42 PM 11/13/2022   10:38 AM 08/08/2022    8:12 AM 05/10/2022    2:38 PM 01/10/2022    6:55 AM  PHQ 2/9 Scores  PHQ - 2 Score 0 0 0 0 0 0 0  PHQ- 9 Score 6  3 2        Fall Risk     05/18/2024    1:08 PM 05/14/2023    2:37 PM 11/13/2022   10:38 AM 08/08/2022    8:12 AM 05/10/2022    2:40 PM  Fall Risk   Falls in the past year? 0  0 1 0  Number falls in past yr: 0 1 0 1   Injury with Fall? 0 0  0   Risk for fall due to : No Fall Risks History of fall(s);Impaired balance/gait;Impaired mobility No Fall Risks History of fall(s)   Follow up Falls evaluation completed;Falls prevention discussed Falls evaluation completed;Education provided;Falls prevention discussed Falls evaluation completed  Falls evaluation completed  Falls evaluation completed      Data saved with a previous flowsheet row definition    MEDICARE RISK AT HOME:  Medicare Risk at Home Any stairs in or around the home?: Yes If so, are there any without handrails?: No Home free of loose throw rugs in walkways, pet beds, electrical cords, etc?: Yes Adequate lighting in your home to reduce risk of falls?: Yes Life alert?: No Use of a cane, walker or w/c?: No Grab bars in the bathroom?: No Shower chair or bench in shower?: Yes Elevated toilet seat or a handicapped toilet?: No  TIMED UP AND GO:  Was the test performed?  No  Cognitive Function: 6CIT completed    03/20/2018    8:49 AM 03/19/2017    8:39 AM 03/12/2016   10:13 AM  MMSE - Mini Mental State Exam  Orientation to time 5 5  5    Orientation to Place 5 5  5    Registration 3 3  3    Attention/ Calculation 4 3  5    Attention/Calculation-comments  difficulty with simple calculation    Recall 1 1  2    Language- name 2 objects 2 2  2    Language- repeat 1 1 1   Language- follow 3 step command 3 3  3    Language- read & follow direction 1 1  1    Write a sentence 1 1  1    Copy  design 1 1  1    Total score 27 26  29       Data saved with a previous flowsheet row definition        05/18/2024    1:21 PM 05/14/2023    2:54 PM 05/10/2022    4:18 PM 04/18/2021    3:25 PM 04/03/2020    1:32 PM  6CIT Screen  What Year? 0 points 0 points 0 points 0 points 0 points  What month? 0 points 0 points 0 points 0 points 0 points  What time? 0 points 0 points 0 points 0 points   Count back from 20 0 points 0 points 0 points  0 points  Months in reverse 0 points 2 points 0 points 0 points 0 points  Repeat phrase 0 points  0 points  2 points  Total Score 0 points  0 points  Immunizations Immunization History  Administered Date(s) Administered   Moderna Sars-Covid-2 Vaccination 12/24/2019, 01/28/2020    Screening Tests Health Maintenance  Topic Date Due   DTaP/Tdap/Td (1 - Tdap) Never done   Medicare Annual Wellness (AWV)  05/13/2024   Pneumococcal Vaccine: 50+ Years (1 of 2 - PCV) 04/05/2025 (Originally 04/24/1965)   INFLUENZA VACCINE  05/21/2024   DEXA SCAN  Completed   Hepatitis C Screening  Completed   Hepatitis B Vaccines  Aged Out   HPV VACCINES  Aged Out   Meningococcal B Vaccine  Aged Out   MAMMOGRAM  Discontinued   Colonoscopy  Discontinued   COVID-19 Vaccine  Discontinued   Zoster Vaccines- Shingrix  Discontinued    Health Maintenance  Health Maintenance Due  Topic Date Due   DTaP/Tdap/Td (1 - Tdap) Never done   Medicare Annual Wellness (AWV)  05/13/2024   Health Maintenance Items Addressed: Discussed the need to update Tetanus (Tdap). Patient declines vaccines at this time. Patient declines Dexa at this time but will talk with PCP about it at next office  visit.   Additional Screening:  Vision Screening: Recommended annual ophthalmology exams for early detection of glaucoma and other disorders of the eye. Overdue  Patient will decide where she wants to go and will call and schedule an appointment Would you like a referral to an eye doctor? No     Dental Screening: Recommended annual dental exams for proper oral hygiene  Community Resource Referral / Chronic Care Management: CRR required this visit?  No   CCM required this visit?  No   Plan:    I have personally reviewed and noted the following in the patient's chart:   Medical and social history Use of alcohol, tobacco or illicit drugs  Current medications and supplements including opioid prescriptions. Patient is not currently taking opioid prescriptions. Functional ability and status Nutritional status Physical activity Advanced directives List of other physicians Hospitalizations, surgeries, and ER visits in previous 12 months Vitals Screenings to include cognitive, depression, and falls Referrals and appointments  In addition, I have reviewed and discussed with patient certain preventive protocols, quality metrics, and best practice recommendations. A written personalized care plan for preventive services as well as general preventive health recommendations were provided to patient.   Angeline Fredericks, LPN   [id number]/[id number]   After Visit Summary: (Declined) Due to this being a telephonic visit, with patients personalized plan was offered to patient but patient Declined AVS at this time   Notes: Nothing significant to report at this time.

## 2024-05-18 NOTE — Patient Instructions (Signed)
 Kristen Strickland , Thank you for taking time out of your busy schedule to complete your Annual Wellness Visit with me. I enjoyed our conversation and look forward to speaking with you again next year. I, as well as your care team,  appreciate your ongoing commitment to your health goals. Please review the following plan we discussed and let me know if I can assist you in the future. Your Game plan/ To Do List    Referrals: If you haven't heard from the office you've been referred to, please reach out to them at the phone provided.  Remember to call and schedule an eye appointment and update your Tetanus (Tdap) at your pharmacy Follow up Visits: Next Medicare AWV with our clinical staff: 05/24/25 @ 1:00   Have you seen your provider in the last 6 months (3 months if uncontrolled diabetes)? Yes Next Office Visit with your provider: 08/06/24  Clinician Recommendations:  Aim for 30 minutes of exercise or brisk walking, 6-8 glasses of water, and 5 servings of fruits and vegetables each day.       This is a list of the screening recommended for you and due dates:  Health Maintenance  Topic Date Due   DTaP/Tdap/Td vaccine (1 - Tdap) Never done   Pneumococcal Vaccine for age over 53 (1 of 2 - PCV) 04/05/2025*   Flu Shot  05/21/2024   Medicare Annual Wellness Visit  05/18/2025   DEXA scan (bone density measurement)  Completed   Hepatitis C Screening  Completed   Hepatitis B Vaccine  Aged Out   HPV Vaccine  Aged Out   Meningitis B Vaccine  Aged Out   Mammogram  Discontinued   Colon Cancer Screening  Discontinued   COVID-19 Vaccine  Discontinued   Zoster (Shingles) Vaccine  Discontinued  *Topic was postponed. The date shown is not the original due date.    Advanced directives: (Declined) Advance directive discussed with you today. Even though you declined this today, please call our office should you change your mind, and we can give you the proper paperwork for you to fill out. Advance Care Planning  is important because it:  [x]  Makes sure you receive the medical care that is consistent with your values, goals, and preferences  [x]  It provides guidance to your family and loved ones and reduces their decisional burden about whether or not they are making the right decisions based on your wishes.

## 2024-06-09 DIAGNOSIS — L988 Other specified disorders of the skin and subcutaneous tissue: Secondary | ICD-10-CM | POA: Diagnosis not present

## 2024-06-09 DIAGNOSIS — Z85828 Personal history of other malignant neoplasm of skin: Secondary | ICD-10-CM | POA: Diagnosis not present

## 2024-06-09 DIAGNOSIS — D0362 Melanoma in situ of left upper limb, including shoulder: Secondary | ICD-10-CM | POA: Diagnosis not present

## 2024-06-13 ENCOUNTER — Other Ambulatory Visit: Payer: Self-pay | Admitting: Internal Medicine

## 2024-06-17 ENCOUNTER — Other Ambulatory Visit

## 2024-06-17 ENCOUNTER — Other Ambulatory Visit (INDEPENDENT_AMBULATORY_CARE_PROVIDER_SITE_OTHER)

## 2024-06-17 DIAGNOSIS — E0590 Thyrotoxicosis, unspecified without thyrotoxic crisis or storm: Secondary | ICD-10-CM | POA: Diagnosis not present

## 2024-06-17 DIAGNOSIS — D649 Anemia, unspecified: Secondary | ICD-10-CM

## 2024-06-17 DIAGNOSIS — E785 Hyperlipidemia, unspecified: Secondary | ICD-10-CM

## 2024-06-17 DIAGNOSIS — R739 Hyperglycemia, unspecified: Secondary | ICD-10-CM

## 2024-06-17 LAB — CBC WITH DIFFERENTIAL/PLATELET
Basophils Absolute: 0.1 K/uL (ref 0.0–0.1)
Basophils Relative: 1.3 % (ref 0.0–3.0)
Eosinophils Absolute: 0.1 K/uL (ref 0.0–0.7)
Eosinophils Relative: 1.9 % (ref 0.0–5.0)
HCT: 37.2 % (ref 36.0–46.0)
Hemoglobin: 12 g/dL (ref 12.0–15.0)
Lymphocytes Relative: 27.2 % (ref 12.0–46.0)
Lymphs Abs: 1.4 K/uL (ref 0.7–4.0)
MCHC: 32.4 g/dL (ref 30.0–36.0)
MCV: 85.3 fl (ref 78.0–100.0)
Monocytes Absolute: 0.4 K/uL (ref 0.1–1.0)
Monocytes Relative: 8.3 % (ref 3.0–12.0)
Neutro Abs: 3.2 K/uL (ref 1.4–7.7)
Neutrophils Relative %: 61.3 % (ref 43.0–77.0)
Platelets: 244 K/uL (ref 150.0–400.0)
RBC: 4.36 Mil/uL (ref 3.87–5.11)
RDW: 15.7 % — ABNORMAL HIGH (ref 11.5–15.5)
WBC: 5.3 K/uL (ref 4.0–10.5)

## 2024-06-17 LAB — TSH: TSH: 3.53 u[IU]/mL (ref 0.35–5.50)

## 2024-06-18 ENCOUNTER — Ambulatory Visit: Payer: Self-pay | Admitting: Internal Medicine

## 2024-06-30 ENCOUNTER — Ambulatory Visit (INDEPENDENT_AMBULATORY_CARE_PROVIDER_SITE_OTHER): Payer: Medicare Other

## 2024-06-30 ENCOUNTER — Ambulatory Visit (INDEPENDENT_AMBULATORY_CARE_PROVIDER_SITE_OTHER): Payer: Medicare Other | Admitting: Nurse Practitioner

## 2024-06-30 ENCOUNTER — Encounter (INDEPENDENT_AMBULATORY_CARE_PROVIDER_SITE_OTHER): Payer: Self-pay | Admitting: Nurse Practitioner

## 2024-06-30 VITALS — BP 168/74 | HR 61 | Ht 64.0 in | Wt 145.0 lb

## 2024-06-30 DIAGNOSIS — E785 Hyperlipidemia, unspecified: Secondary | ICD-10-CM | POA: Diagnosis not present

## 2024-06-30 DIAGNOSIS — I10 Essential (primary) hypertension: Secondary | ICD-10-CM | POA: Diagnosis not present

## 2024-06-30 DIAGNOSIS — I6523 Occlusion and stenosis of bilateral carotid arteries: Secondary | ICD-10-CM | POA: Diagnosis not present

## 2024-06-30 NOTE — Progress Notes (Unsigned)
 Subjective:    Patient ID: Kristen Strickland, female    DOB: 08-04-1946, 78 y.o.   MRN: [id number] Chief Complaint  Patient presents with  . Carotid    1 year follow up     The patient is seen for follow up evaluation of carotid stenosis. The carotid stenosis followed by ultrasound.   The patient denies amaurosis fugax. There is no recent history of TIA symptoms or focal motor deficits. There is no prior documented CVA.  The patient is taking enteric-coated aspirin  81 mg daily.  There is no history of migraine headaches. There is no history of seizures.  No recent shortening of the patient's walking distance or new symptoms consistent with claudication.  No history of rest pain symptoms. No new ulcers or wounds of the lower extremities have occurred.  There is no history of DVT, PE or superficial thrombophlebitis. No documented recent episodes of angina or shortness of breath documented.   Carotid Duplex done today shows 1-39% stenosis of the RICA .  No change compared to last study in ***    Review of Systems     Objective:   Physical Exam  BP (!) 168/74   Pulse 61   Ht 5' 4 (1.626 m)   Wt 145 lb (65.8 kg)   BMI 24.89 kg/m   Past Medical History:  Diagnosis Date  . Aortic atherosclerosis (HCC)   . Arthritis   . Carotid arterial disease (HCC)    a. 11/2017 Carotid U/S: RICA 1-39%, LICA 40-59%.   . Chronic fatigue   . Chronic heart failure with preserved ejection fraction (HCC)   . Chronic leg pain   . Complete tear of right rotator cuff   . Coronary artery disease involving native coronary artery of native heart without angina pectoris   . DDD (degenerative disc disease), lumbar   . Dyspnea on exertion    a. 02/2017 Echo: EF 60-65%, no rwma, mild MR. Nl RV size/fxn. Nl PASP; b. 02/2017 MV: small, mild, fixed basal inferolateral defect - ? artifact vs scar. No ischemia. EF >65%.  . Hypercholesterolemia   . Hypertension   . Insomnia   . Long-term use of aspirin   therapy   . Mild cognitive impairment   . Palpitations    a. 02/2017 Holter: Avg HR 66 (51-115), rare PACs, four brief runs of PAT - up to 5 beats, max rate 157. Rare isolated PVCs. No sustained arrhythmias; b. 03/2018 24h Holter: Rare PACs, 3 beat run PAT (122 bpm), PVCs (2% of beats).  . Radiculopathy, lumbar region   . Sinus bradycardia   . Skin cancer   . Spondylolisthesis of lumbosacral region     Social History   Socioeconomic History  . Marital status: Married    Spouse name: Not on file  . Number of children: 2  . Years of education: Not on file  . Highest education level: Not on file  Occupational History  . Not on file  Tobacco Use  . Smoking status: Never  . Smokeless tobacco: Never  Vaping Use  . Vaping status: Never Used  Substance and Sexual Activity  . Alcohol use: Not Currently    Alcohol/week: 0.0 standard drinks of alcohol    Comment: rarely  . Drug use: No  . Sexual activity: Yes  Other Topics Concern  . Not on file  Social History Narrative   ** Merged History Encounter **    Married   Social Drivers of Dispensing optician  Resource Strain: Low Risk  (05/18/2024)   Overall Financial Resource Strain (CARDIA)   . Difficulty of Paying Living Expenses: Not hard at all  Food Insecurity: No Food Insecurity (05/18/2024)   Hunger Vital Sign   . Worried About Programme researcher, broadcasting/film/video in the Last Year: Never true   . Ran Out of Food in the Last Year: Never true  Transportation Needs: No Transportation Needs (05/18/2024)   PRAPARE - Transportation   . Lack of Transportation (Medical): No   . Lack of Transportation (Non-Medical): No  Physical Activity: Inactive (05/18/2024)   Exercise Vital Sign   . Days of Exercise per Week: 0 days   . Minutes of Exercise per Session: 0 min  Stress: No Stress Concern Present (05/18/2024)   [employer] of Occupational Health - Occupational Stress Questionnaire   . Feeling of Stress: Only a little  Social Connections: Socially  Integrated (05/18/2024)   Social Connection and Isolation Panel   . Frequency of Communication with Friends and Family: More than three times a week   . Frequency of Social Gatherings with Friends and Family: More than three times a week   . Attends Religious Services: More than 4 times per year   . Active Member of Clubs or Organizations: Yes   . Attends Banker Meetings: Never   . Marital Status: Married  Catering manager Violence: Not At Risk (05/18/2024)   Humiliation, Afraid, Rape, and Kick questionnaire   . Fear of Current or Ex-Partner: No   . Emotionally Abused: No   . Physically Abused: No   . Sexually Abused: No    Past Surgical History:  Procedure Laterality Date  . ABDOMINAL EXPOSURE N/A 11/10/2018   Procedure: ABDOMINAL EXPOSURE;  Surgeon: Eliza Lonni RAMAN, MD;  Location: [facility] OR;  Service: Vascular;  Laterality: N/A;  . ABDOMINAL HYSTERECTOMY  10/21/1973  . ANTERIOR CERVICAL DECOMP/DISCECTOMY FUSION N/A 04/09/2013   Procedure: ANTERIOR CERVICAL DECOMPRESSION/DISCECTOMY FUSION 2 LEVELS;  Surgeon: Catalina CHRISTELLA Stains, MD;  Location: [facility];  Service: Neurosurgery;  Laterality: N/A;  Cervical five-six Cervical six-seven  Anterior cervical decompression/diskectomy/fusion  . ANTERIOR LUMBAR FUSION N/A 11/10/2018   Procedure: Anterior Lumbar Interbody Fusion-Lumbar five-Sacral one;  Surgeon: Louis Shove, MD;  Location: [facility] OR;  Service: Neurosurgery;  Laterality: N/A;  . BACK SURGERY     ruptured disc  . RECONSTRUCTION OF NOSE    . REVERSE SHOULDER ARTHROPLASTY Right 02/05/2024   Procedure: ARTHROPLASTY, SHOULDER, TOTAL, REVERSE;  Surgeon: Edie Norleen PARAS, MD;  Location: [facility] ORS;  Service: Orthopedics;  Laterality: Right;  . skin lesion removed Left 04/2024   left arm    Family History  Problem Relation Age of Onset  . Cancer Mother        ovarian/uterine  . Heart disease Mother   . Heart attack Mother   . Stroke Mother   . Colon cancer Father   .  Dementia Father   . COPD Brother   . Congestive Heart Failure Brother   . Stroke Daughter   . Heart attack Daughter   . Suicidality Brother   . Breast cancer Neg Hx     No Known Allergies     Latest Ref Rng & Units 06/17/2024    8:43 AM 05/04/2024   10:07 AM 04/22/2024   10:10 AM  CBC  WBC 4.0 - 10.5 K/uL 5.3  4.1  6.0   Hemoglobin 12.0 - 15.0 g/dL 87.9  88.9  89.0   Hematocrit 36.0 -  46.0 % 37.2  33.3  33.5   Platelets 150.0 - 400.0 K/uL 244.0  238.0  477.0       CMP     Component Value Date/Time   NA 138 05/04/2024 1007   NA 136 04/05/2024 0747   K 4.1 05/04/2024 1007   CL 102 05/04/2024 1007   CO2 30 05/04/2024 1007   GLUCOSE 105 (H) 05/04/2024 1007   BUN 16 05/04/2024 1007   BUN 11 04/05/2024 0747   CREATININE 0.63 05/04/2024 1007   CREATININE 0.87 05/11/2021 1423   CALCIUM  9.4 05/04/2024 1007   PROT 6.7 04/05/2024 0747   ALBUMIN 4.0 04/05/2024 0747   AST 13 04/05/2024 0747   ALT 13 04/05/2024 0747   ALKPHOS 106 04/05/2024 0747   BILITOT 0.7 04/05/2024 0747   GFR 85.80 05/04/2024 1007   EGFR 84 04/05/2024 0747   GFRNONAA >60 01/27/2024 1401     No results found.     Assessment & Plan:   1. Bilateral carotid artery stenosis (Primary) ***  2. Primary hypertension Continue antihypertensive medications as already ordered, these medications have been reviewed and there are no changes at this time.  3. Hyperlipidemia LDL goal <70 Continue statin as ordered and reviewed, no changes at this time   Current Outpatient Medications on File Prior to Visit  Medication Sig Dispense Refill  . acetaminophen  (TYLENOL ) 650 MG CR tablet Take 650 mg by mouth in the morning.    . amLODipine  (NORVASC ) 2.5 MG tablet TAKE ONE TABLET BY MOUTH ONCE DAILY 90 tablet 0  . aspirin  EC 81 MG tablet Take 81 mg by mouth daily with lunch.    . ezetimibe  (ZETIA ) 10 MG tablet Take 1 tablet (10 mg total) by mouth at bedtime.    . gabapentin  (NEURONTIN ) 100 MG capsule TAKE ONE CAPSULE  BY MOUTH EVERY DAY 90 capsule 0  . losartan -hydrochlorothiazide  (HYZAAR) 100-25 MG tablet TAKE ONE TABLET BY MOUTH ONCE DAILY 90 tablet 1  . meloxicam  (MOBIC ) 7.5 MG tablet Take 1 tablet (7.5 mg total) by mouth every other day as needed for pain (pain/stiffness).    . Multiple Vitamins-Minerals (CENTRUM ADULTS PO) Take 1 capsule by mouth in the morning.    . pantoprazole  (PROTONIX ) 40 MG tablet TAKE ONE TABLET BY MOUTH EVERY DAY 90 tablet 1  . pravastatin  (PRAVACHOL ) 10 MG tablet Take 1 tablet (10 mg total) by mouth every Monday, Wednesday, and Friday. 39 tablet 2  . Probiotic Product (PROBIOTIC PO) Take 1 capsule by mouth at bedtime.    . SODIUM FLUORIDE 5000 PPM 1.1 % PSTE daily. (Patient not taking: Reported on 06/30/2024)     No current facility-administered medications on file prior to visit.    There are no Patient Instructions on file for this visit. No follow-ups on file.   Ahrianna Siglin E Tanessa Tidd, NP

## 2024-07-05 DIAGNOSIS — R200 Anesthesia of skin: Secondary | ICD-10-CM | POA: Diagnosis not present

## 2024-07-26 DIAGNOSIS — G5601 Carpal tunnel syndrome, right upper limb: Secondary | ICD-10-CM | POA: Diagnosis not present

## 2024-07-26 DIAGNOSIS — M19011 Primary osteoarthritis, right shoulder: Secondary | ICD-10-CM | POA: Diagnosis not present

## 2024-07-26 DIAGNOSIS — M12811 Other specific arthropathies, not elsewhere classified, right shoulder: Secondary | ICD-10-CM | POA: Diagnosis not present

## 2024-07-26 DIAGNOSIS — Z96611 Presence of right artificial shoulder joint: Secondary | ICD-10-CM | POA: Diagnosis not present

## 2024-07-30 ENCOUNTER — Other Ambulatory Visit: Payer: Self-pay | Admitting: Internal Medicine

## 2024-08-06 ENCOUNTER — Ambulatory Visit: Admitting: Internal Medicine

## 2024-08-06 VITALS — BP 118/70 | HR 60 | Resp 16 | Ht 64.0 in | Wt 145.6 lb

## 2024-08-06 DIAGNOSIS — F439 Reaction to severe stress, unspecified: Secondary | ICD-10-CM

## 2024-08-06 DIAGNOSIS — M25562 Pain in left knee: Secondary | ICD-10-CM | POA: Diagnosis not present

## 2024-08-06 DIAGNOSIS — G720 Drug-induced myopathy: Secondary | ICD-10-CM | POA: Diagnosis not present

## 2024-08-06 DIAGNOSIS — M21962 Unspecified acquired deformity of left lower leg: Secondary | ICD-10-CM | POA: Diagnosis not present

## 2024-08-06 DIAGNOSIS — I6523 Occlusion and stenosis of bilateral carotid arteries: Secondary | ICD-10-CM

## 2024-08-06 DIAGNOSIS — E785 Hyperlipidemia, unspecified: Secondary | ICD-10-CM | POA: Diagnosis not present

## 2024-08-06 DIAGNOSIS — M25552 Pain in left hip: Secondary | ICD-10-CM | POA: Diagnosis not present

## 2024-08-06 DIAGNOSIS — R739 Hyperglycemia, unspecified: Secondary | ICD-10-CM | POA: Diagnosis not present

## 2024-08-06 DIAGNOSIS — I10 Essential (primary) hypertension: Secondary | ICD-10-CM | POA: Diagnosis not present

## 2024-08-06 DIAGNOSIS — I700 Atherosclerosis of aorta: Secondary | ICD-10-CM | POA: Diagnosis not present

## 2024-08-06 DIAGNOSIS — T466X5A Adverse effect of antihyperlipidemic and antiarteriosclerotic drugs, initial encounter: Secondary | ICD-10-CM | POA: Diagnosis not present

## 2024-08-06 DIAGNOSIS — I5032 Chronic diastolic (congestive) heart failure: Secondary | ICD-10-CM

## 2024-08-06 DIAGNOSIS — D649 Anemia, unspecified: Secondary | ICD-10-CM | POA: Diagnosis not present

## 2024-08-06 NOTE — Progress Notes (Unsigned)
 Subjective:    Patient ID: Kristen Strickland, female    DOB: 1946/01/06, 78 y.o.   MRN: [id number]  Patient here for  Chief Complaint  Patient presents with   Medical Management of Chronic Issues    HPI Here for a scheduled follow up - follow up regarding hypertension, hypertension and hypercholesterolemia. Saw ortho 05/14/24 - CTS right - recommended wrist splint and NCS. NCS 07/05/24 - chronic, mild right CTS. Saw ortho 07/26/24 - s/p right reverse total shoulder arthroplasty. Stable. Had f/u with [facility] 06/30/24 - Carotid Duplex done today shows 1-39% stenosis of the RICA . There is a 40 to 59% stenosis of the left ICA. (Velocities significantly increased left) - recommended 6 month f/u.     Past Medical History:  Diagnosis Date   Aortic atherosclerosis    Arthritis    Carotid arterial disease    a. 11/2017 Carotid U/S: RICA 1-39%, LICA 40-59%.    Chronic fatigue    Chronic heart failure with preserved ejection fraction (HCC)    Chronic leg pain    Complete tear of right rotator cuff    Coronary artery disease involving native coronary artery of native heart without angina pectoris    DDD (degenerative disc disease), lumbar    Dyspnea on exertion    a. 02/2017 Echo: EF 60-65%, no rwma, mild MR. Nl RV size/fxn. Nl PASP; b. 02/2017 MV: small, mild, fixed basal inferolateral defect - ? artifact vs scar. No ischemia. EF >65%.   Hypercholesterolemia    Hypertension    Insomnia    Long-term use of aspirin  therapy    Mild cognitive impairment    Palpitations    a. 02/2017 Holter: Avg HR 66 (51-115), rare PACs, four brief runs of PAT - up to 5 beats, max rate 157. Rare isolated PVCs. No sustained arrhythmias; b. 03/2018 24h Holter: Rare PACs, 3 beat run PAT (122 bpm), PVCs (2% of beats).   Radiculopathy, lumbar region    Sinus bradycardia    Skin cancer    Spondylolisthesis of lumbosacral region    Past Surgical History:  Procedure Laterality Date   ABDOMINAL EXPOSURE N/A 11/10/2018    Procedure: ABDOMINAL EXPOSURE;  Surgeon: Eliza Lonni RAMAN, MD;  Location: [facility] OR;  Service: Vascular;  Laterality: N/A;   ABDOMINAL HYSTERECTOMY  10/21/1973   ANTERIOR CERVICAL DECOMP/DISCECTOMY FUSION N/A 04/09/2013   Procedure: ANTERIOR CERVICAL DECOMPRESSION/DISCECTOMY FUSION 2 LEVELS;  Surgeon: Catalina CHRISTELLA Stains, MD;  Location: [facility];  Service: Neurosurgery;  Laterality: N/A;  Cervical five-six Cervical six-seven  Anterior cervical decompression/diskectomy/fusion   ANTERIOR LUMBAR FUSION N/A 11/10/2018   Procedure: Anterior Lumbar Interbody Fusion-Lumbar five-Sacral one;  Surgeon: Louis Shove, MD;  Location: [facility] OR;  Service: Neurosurgery;  Laterality: N/A;   BACK SURGERY     ruptured disc   RECONSTRUCTION OF NOSE     REVERSE SHOULDER ARTHROPLASTY Right 02/05/2024   Procedure: ARTHROPLASTY, SHOULDER, TOTAL, REVERSE;  Surgeon: Edie Norleen PARAS, MD;  Location: [facility] ORS;  Service: Orthopedics;  Laterality: Right;   skin lesion removed Left 04/2024   left arm   Family History  Problem Relation Age of Onset   Cancer Mother        ovarian/uterine   Heart disease Mother    Heart attack Mother    Stroke Mother    Colon cancer Father    Dementia Father    COPD Brother    Congestive Heart Failure Brother    Stroke Daughter    Heart  attack Daughter    Suicidality Brother    Breast cancer Neg Hx    Social History   Socioeconomic History   Marital status: Married    Spouse name: Not on file   Number of children: 2   Years of education: Not on file   Highest education level: Not on file  Occupational History   Not on file  Tobacco Use   Smoking status: Never   Smokeless tobacco: Never  Vaping Use   Vaping status: Never Used  Substance and Sexual Activity   Alcohol use: Not Currently    Alcohol/week: 0.0 standard drinks of alcohol    Comment: rarely   Drug use: No   Sexual activity: Yes  Other Topics Concern   Not on file  Social History Narrative   ** Merged History  Encounter **    Married   Social Drivers of Corporate investment banker Strain: Low Risk  (05/18/2024)   Overall Financial Resource Strain (CARDIA)    Difficulty of Paying Living Expenses: Not hard at all  Food Insecurity: No Food Insecurity (05/18/2024)   Hunger Vital Sign    Worried About Running Out of Food in the Last Year: Never true    Ran Out of Food in the Last Year: Never true  Transportation Needs: No Transportation Needs (05/18/2024)   PRAPARE - Administrator, Civil Service (Medical): No    Lack of Transportation (Non-Medical): No  Physical Activity: Inactive (05/18/2024)   Exercise Vital Sign    Days of Exercise per Week: 0 days    Minutes of Exercise per Session: 0 min  Stress: No Stress Concern Present (05/18/2024)   [employer] of Occupational Health - Occupational Stress Questionnaire    Feeling of Stress: Only a little  Social Connections: Socially Integrated (05/18/2024)   Social Connection and Isolation Panel    Frequency of Communication with Friends and Family: More than three times a week    Frequency of Social Gatherings with Friends and Family: More than three times a week    Attends Religious Services: More than 4 times per year    Active Member of [employer] or Organizations: Yes    Attends Banker Meetings: Never    Marital Status: Married     Review of Systems     Objective:     BP 118/70   Pulse 60   Resp 16   Ht 5' 4 (1.626 m)   Wt 145 lb 9.6 oz (66 kg)   SpO2 98%   BMI 24.99 kg/m  Wt Readings from Last 3 Encounters:  08/06/24 145 lb 9.6 oz (66 kg)  06/30/24 145 lb (65.8 kg)  05/18/24 143 lb (64.9 kg)    Physical Exam  {Perform Simple Foot Exam  Perform Detailed exam:1} {Insert foot Exam (Optional):30965}   Outpatient Encounter Medications as of 08/06/2024  Medication Sig   acetaminophen  (TYLENOL ) 650 MG CR tablet Take 650 mg by mouth in the morning.   amLODipine  (NORVASC ) 2.5 MG tablet TAKE ONE TABLET  BY MOUTH ONCE DAILY   aspirin  EC 81 MG tablet Take 81 mg by mouth daily with lunch.   ezetimibe  (ZETIA ) 10 MG tablet Take 1 tablet (10 mg total) by mouth at bedtime.   gabapentin  (NEURONTIN ) 100 MG capsule TAKE ONE CAPSULE BY MOUTH EVERY DAY   losartan -hydrochlorothiazide  (HYZAAR) 100-25 MG tablet TAKE ONE TABLET BY MOUTH ONCE DAILY   meloxicam  (MOBIC ) 7.5 MG tablet Take 1 tablet (7.5 mg  total) by mouth every other day as needed for pain (pain/stiffness).   Multiple Vitamins-Minerals (CENTRUM ADULTS PO) Take 1 capsule by mouth in the morning.   pantoprazole  (PROTONIX ) 40 MG tablet TAKE ONE TABLET BY MOUTH EVERY DAY   pravastatin  (PRAVACHOL ) 10 MG tablet Take 1 tablet (10 mg total) by mouth every Monday, Wednesday, and Friday.   Probiotic Product (PROBIOTIC PO) Take 1 capsule by mouth at bedtime.   SODIUM FLUORIDE 5000 PPM 1.1 % PSTE daily. (Patient not taking: Reported on 06/30/2024)   No facility-administered encounter medications on file as of 08/06/2024.     Lab Results  Component Value Date   WBC 5.3 06/17/2024   HGB 12.0 06/17/2024   HCT 37.2 06/17/2024   PLT 244.0 06/17/2024   GLUCOSE 105 (H) 05/04/2024   CHOL 211 (H) 04/05/2024   TRIG 139 04/05/2024   HDL 54 04/05/2024   LDLDIRECT 151.7 08/16/2013   LDLCALC 132 (H) 04/05/2024   ALT 13 04/05/2024   AST 13 04/05/2024   NA 138 05/04/2024   K 4.1 05/04/2024   CL 102 05/04/2024   CREATININE 0.63 05/04/2024   BUN 16 05/04/2024   CO2 30 05/04/2024   TSH 3.53 06/17/2024   HGBA1C 5.8 (H) 04/05/2024    DG Shoulder Right Port Result Date: 02/05/2024 CLINICAL DATA:  Postop. EXAM: RIGHT SHOULDER - 1 VIEW COMPARISON:  None Available. FINDINGS: Reverse right shoulder arthroplasty in expected alignment. No periprosthetic lucency or fracture. Recent postsurgical change includes air and edema in the joint space and soft tissues. Overlying skin staples in place. IMPRESSION: Reverse right shoulder arthroplasty without immediate  postoperative complication. Electronically Signed   By: Andrea Gasman M.D.   On: 02/05/2024 13:06   US  OR NERVE BLOCK-IMAGE ONLY [facility]) Result Date: 02/05/2024 There is no interpretation for this exam.  This order is for images obtained during a surgical procedure.  Please See Surgeries Tab for more information regarding the procedure.       Assessment & Plan:  There are no diagnoses linked to this encounter.   Allena Hamilton, MD

## 2024-08-07 ENCOUNTER — Encounter: Payer: Self-pay | Admitting: Internal Medicine

## 2024-08-07 DIAGNOSIS — M25559 Pain in unspecified hip: Secondary | ICD-10-CM | POA: Insufficient documentation

## 2024-08-07 NOTE — Assessment & Plan Note (Signed)
 Intolerant to statin medication. Continue zetia . Follow lipid panel.

## 2024-08-07 NOTE — Assessment & Plan Note (Signed)
 Continue losartan /hydrochlorothiazide  and amlodipine . Blood pressure as outlined. Follow pressures. No change in medication. Follow metabolic panel.

## 2024-08-07 NOTE — Assessment & Plan Note (Signed)
 Continue zetia. Intolerant to statin medication.

## 2024-08-07 NOTE — Assessment & Plan Note (Signed)
 Most recent hgb 12. Follow.

## 2024-08-07 NOTE — Assessment & Plan Note (Signed)
 Persistent left hip pain - refer to ortho for further evaluation and treatment.

## 2024-08-07 NOTE — Assessment & Plan Note (Signed)
 Continues on losartan /hctz.  No evidence of volume overload on exam.  Breathing stable. Follow metabolic panel.

## 2024-08-07 NOTE — Assessment & Plan Note (Signed)
 Reports - gait affected - foot turned and increased foot pain. Will have podiatry evaluate.

## 2024-08-07 NOTE — Assessment & Plan Note (Signed)
 Overall appears to be doing well.  Follow.

## 2024-08-07 NOTE — Assessment & Plan Note (Signed)
 Intolerant to crestor .  Leg aches.  Continues zetia .

## 2024-08-07 NOTE — Assessment & Plan Note (Signed)
 Had f/u with [facility] 06/30/24 - Carotid Duplex - 1-39% stenosis of the RICA . There is a 40 to 59% stenosis of the left ICA. (Velocities significantly increased left) - recommended 6 month f/u.

## 2024-08-07 NOTE — Assessment & Plan Note (Signed)
 Follow met b and A1c.

## 2024-08-19 ENCOUNTER — Encounter: Payer: Self-pay | Admitting: Family Medicine

## 2024-08-19 ENCOUNTER — Ambulatory Visit: Payer: Self-pay

## 2024-08-19 ENCOUNTER — Ambulatory Visit: Admitting: Family Medicine

## 2024-08-19 VITALS — BP 134/65 | HR 75 | Resp 16 | Wt 147.0 lb

## 2024-08-19 DIAGNOSIS — R10A2 Flank pain, left side: Secondary | ICD-10-CM

## 2024-08-19 DIAGNOSIS — M545 Low back pain, unspecified: Secondary | ICD-10-CM | POA: Diagnosis not present

## 2024-08-19 DIAGNOSIS — R52 Pain, unspecified: Secondary | ICD-10-CM | POA: Diagnosis not present

## 2024-08-19 DIAGNOSIS — Z87448 Personal history of other diseases of urinary system: Secondary | ICD-10-CM | POA: Diagnosis not present

## 2024-08-19 DIAGNOSIS — R10A0 Flank pain, unspecified side: Secondary | ICD-10-CM | POA: Diagnosis not present

## 2024-08-19 LAB — POCT URINALYSIS DIPSTICK
Bilirubin, UA: NEGATIVE
Blood, UA: NEGATIVE
Glucose, UA: NEGATIVE
Ketones, UA: NEGATIVE
Leukocytes, UA: NEGATIVE
Nitrite, UA: NEGATIVE
Protein, UA: NEGATIVE
Spec Grav, UA: 1.01 (ref 1.010–1.025)
Urobilinogen, UA: 0.2 U/dL
pH, UA: 6 (ref 5.0–8.0)

## 2024-08-19 NOTE — Progress Notes (Signed)
 Acute Office Visit  Introduced to nurse practitioner role and practice setting.  All questions answered.  Discussed provider/patient relationship and expectations.   Subjective:     Patient ID: Kristen Strickland, female    DOB: 29-Jul-1946, 78 y.o.   MRN: [id number]  Chief Complaint  Patient presents with   Flank Pain    Started last night    Discussed the use of AI scribe software for clinical note transcription with the patient, who gave verbal consent to proceed.  History of Present Illness Kristen Strickland is a 78 year old female with hx of pyelonephritis presents with L flank pain.  She has been experiencing back pain for the past couple of days, which worsened last night while sitting in a chair. The pain is described as a dull ache, primarily located in the center/L of her back, and intensifies with deep breathing. No sharp pain is present, and the pain is not as severe as it was last night, with a maximum intensity of eight out of ten.  She has a history of pyelonephritis in June. She was hospitalized for four days during that episode and is concerned about her current symptoms due to this past experience. However, she has no recent fever, chills, nausea, vomiting, or difficulty eating or drinking. Denies dysuria, increased urinary frequency, urgency, odor.   She has a history of back surgery twice and neck surgery, which affects her ability to bend and turn. Mentions back pain could be due to this. She has been taking Tylenol  for pain relief but avoids ibuprofen or NSAIDs due to concerns about blood pressure. She has tried using heat for pain relief and reports some improvement.  Flank Pain Pertinent negatives include no fever or weight loss.    Review of Systems  Constitutional:  Negative for chills, fever, malaise/fatigue and weight loss.  HENT: Negative.    Eyes: Negative.   Respiratory: Negative.    Cardiovascular: Negative.   Gastrointestinal: Negative.    Genitourinary:  Positive for flank pain.  Musculoskeletal:  Positive for back pain.  Skin:  Negative for itching and rash.  Neurological: Negative.   Endo/Heme/Allergies: Negative.   Psychiatric/Behavioral: Negative.          Objective:    BP 134/65 (BP Location: Left Arm, Patient Position: Sitting, Cuff Size: Normal)   Pulse 75   Resp 16   Wt 147 lb (66.7 kg)   SpO2 98%   BMI 25.23 kg/m    Physical Exam Constitutional:      General: She is not in acute distress.    Appearance: Normal appearance. She is not ill-appearing, toxic-appearing or diaphoretic.  HENT:     Head: Normocephalic.     Nose: Nose normal.     Mouth/Throat:     Mouth: Mucous membranes are moist.     Pharynx: Oropharynx is clear.  Eyes:     Extraocular Movements: Extraocular movements intact.     Pupils: Pupils are equal, round, and reactive to light.  Cardiovascular:     Rate and Rhythm: Normal rate and regular rhythm.     Pulses: Normal pulses.     Heart sounds: Normal heart sounds. No murmur heard.    No friction rub. No gallop.  Pulmonary:     Effort: Pulmonary effort is normal. No respiratory distress.     Breath sounds: Normal breath sounds. No stridor. No wheezing, rhonchi or rales.  Chest:     Chest wall: No tenderness.  Abdominal:  General: Abdomen is flat. Bowel sounds are normal. There is no distension.     Palpations: Abdomen is soft. There is no mass.     Tenderness: There is no abdominal tenderness. There is no right CVA tenderness, left CVA tenderness, guarding or rebound.     Hernia: No hernia is present.  Musculoskeletal:     Thoracic back: Normal.     Lumbar back: Tenderness present.     Left hip: Tenderness present.     Right lower leg: No edema.     Left lower leg: No edema.  Skin:    General: Skin is warm and dry.     Capillary Refill: Capillary refill takes less than 2 seconds.  Neurological:     General: No focal deficit present.     Mental Status: She is alert  and oriented to person, place, and time. Mental status is at baseline.  Psychiatric:        Mood and Affect: Mood normal.        Behavior: Behavior normal.        Thought Content: Thought content normal.        Judgment: Judgment normal.     Results for orders placed or performed in visit on 08/19/24  POCT urinalysis dipstick  Result Value Ref Range   Color, UA Yellow    Clarity, UA Clear    Glucose, UA Negative Negative   Bilirubin, UA Negative    Ketones, UA Negative    Spec Grav, UA 1.010 1.010 - 1.025   Blood, UA Negative    pH, UA 6.0 5.0 - 8.0   Protein, UA Negative Negative   Urobilinogen, UA 0.2 0.2 or 1.0 E.U./dL   Nitrite, UA Negative    Leukocytes, UA Negative Negative   Appearance     Odor          Assessment & Plan:  Assessment and Plan Assessment & Plan Flank and low back pain  - flank pain started last night - L/middle of back location - no CVA tenderness, no spine or muscle tenderness palpable  - No abdominal tenderness to palpation, bowel sounds active - hx of Intermittent low back pain with sciatica, exacerbated by sitting & deep breaths, rated 8/10  - No current pain.  - denies fevers, chills, dysuria, odor, frequency, chills, lethargy - No SOB, DOB, chest pain, palpitations - Vitals are stable during visit - Previous back surgeries - hx of pyelonephritis  - urinary vs musculoskeletal pain  - POC UA - negative, will check urine culture - will treat if positive - Provided exercises for home management of L back pain -tenderness gluteal/lumbar region - states has appointment upcoming with ortho surg - Advised use of heat application to affected area. - Recommended Tylenol  for pain management - Instructed to follow up if pain worsens or if new symptoms develop. - keep hydrated, at least 60 - 70 ounces per day  History of pyelonephritis - with previous hospitalization. No current signs of nephritis based on urinalysis and absence of fever or  urinary symptoms. - Ensure adequate hydration to support kidney function. - Monitor for any new symptoms suggestive of nephritis recurrence. - POC UA - negative, will still send for culture given hx.     Problem List Items Addressed This Visit       Other   Back pain   Other Visit Diagnoses       Left flank pain    -  Primary   Relevant  Orders   POCT urinalysis dipstick (Completed)   Urine Culture     Pain, unspecified       Relevant Orders   Urine Culture     History of pyelonephritis       Relevant Orders   Urine Culture       No orders of the defined types were placed in this encounter.   Return if symptoms worsen or fail to improve.  Curtis DELENA Boom, FNP  I, Curtis DELENA Boom, FNP, have reviewed all documentation for this visit. The documentation on 08/19/24 for the exam, diagnosis, procedures, and orders are all accurate and complete.

## 2024-08-19 NOTE — Telephone Encounter (Signed)
 FYI Only or Action Required?: FYI only for provider: appointment scheduled on 08/19/24.  Patient was last seen in primary care on 08/06/2024 by Glendia Shad, MD.  Called Nurse Triage reporting Flank Pain.  Symptoms began several days ago.  Interventions attempted: Rest, hydration, or home remedies.  Symptoms are: gradually worsening.  Triage Disposition: See PCP When Office is Open (Within 3 Days)  Patient/caregiver understands and will follow disposition?:  Reason for Disposition  [1] MILD pain (i.e., scale 1-3; does not interfere with normal activities) AND [2] present > 3 days  Answer Assessment - Initial Assessment Questions Pt reports flank pain with hx of kidney infection 4-5 months ago and was hospitalized.  1. LOCATION: Where does it hurt? (e.g., left, right)     Flank  2. ONSET: When did the pain start?     1-2 days  3. SEVERITY: How bad is the pain? (e.g., Scale 1-10; mild, moderate, or severe)     It's not bad  4. PATTERN: Does the pain come and go, or is it constant?      Worse with deep breathing  5. CAUSE: What do you think is causing the pain?     Unknown  6. OTHER SYMPTOMS:  Do you have any other symptoms? (e.g., fever, abdomen pain, vomiting, leg weakness, burning with urination, blood in urine)     Denies  Protocols used: Flank Pain-A-AH Copied from CRM #[id number]. Topic: Clinical - Red Word Triage >> Aug 19, 2024  8:05 AM Adelita E wrote: [facility] that prompted transfer to Nurse Triage: Potential kidney infection. Patient stated she had one 4-5 months ago, and she thinks it is back, lower back pain.

## 2024-08-19 NOTE — Progress Notes (Deleted)
 Established Patient Office Visit  Introduced to nurse practitioner role and practice setting.  All questions answered.  Discussed provider/patient relationship and expectations.   Subjective   Patient ID: Kristen Strickland, female    DOB: [birth year]  Age: 78 y.o. MRN: [id number]  Chief Complaint  Patient presents with  . Flank Pain    Started last night    Discussed the use of AI scribe software for clinical note transcription with the patient, who gave verbal consent to proceed.  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  She has been experiencing back pain for the past couple of days, which worsened last night while sitting in a chair. The pain is described as a dull ache, primarily located in the center of her back, and intensifies with deep breathing. No sharp pain is present, and the pain is not as severe as it was last night, with a maximum intensity of eight out of ten.  She has a history of myeloma nephritis, which led to hospitalization approximately five months ago due to severe chills. She was hospitalized for four days during that episode and is concerned about her current symptoms due to this past experience. However, she has no recent fever, chills, nausea, vomiting, or difficulty eating or drinking.  She mentions having neuropathy in her foot and leg. She also reports swelling in her foot, which she attributes to stepping the wrong way recently.  She has a history of back surgery twice and neck surgery, which affects her ability to bend and turn. She has been taking Tylenol  for pain relief but avoids ibuprofen or NSAIDs due to concerns about blood pressure. She has tried using heat for pain relief and reports some improvement.     {History (Optional):23778}    08/19/2024    1:09 PM 05/18/2024    1:12 PM 04/19/2024    3:49 PM  Depression screen PHQ 2/9  Decreased Interest 0 0 0  Down,  Depressed, Hopeless 0 0 0  PHQ - 2 Score 0 0 0  Altered sleeping  0   Tired, decreased energy  2   Change in appetite  3   Feeling bad or failure about yourself   0   Trouble concentrating  1   Moving slowly or fidgety/restless  0   Suicidal thoughts  0   PHQ-9 Score  6   Difficult doing work/chores  Not difficult at all        11/13/2022   10:39 AM  GAD 7 : Generalized Anxiety Score  Nervous, Anxious, on Edge 0  Control/stop worrying 0  Worry too much - different things 0  Trouble relaxing 0  Restless 0  Easily annoyed or irritable 0  Afraid - awful might happen 0  Total GAD 7 Score 0  Anxiety Difficulty Not difficult at all     ROS  Negative unless indicated in HPI   Objective:     BP 134/65 (BP Location: Left Arm, Patient Position: Sitting, Cuff Size: Normal)   Pulse 75   Resp 16   Wt 147 lb (66.7 kg)   SpO2 98%   BMI 25.23 kg/m  {Vitals History (Optional):23777}  Physical Exam   Results for orders placed or performed in visit on 08/19/24  POCT urinalysis dipstick  Result Value Ref Range   Color, UA Yellow    Clarity, UA Clear    Glucose, UA Negative Negative  Bilirubin, UA Negative    Ketones, UA Negative    Spec Grav, UA 1.010 1.010 - 1.025   Blood, UA Negative    pH, UA 6.0 5.0 - 8.0   Protein, UA Negative Negative   Urobilinogen, UA 0.2 0.2 or 1.0 E.U./dL   Nitrite, UA Negative    Leukocytes, UA Negative Negative   Appearance     Odor      {Labs (Optional):23779}  The 10-year ASCVD risk score (Arnett DK, et al., 2019) is: 27.7%    Assessment & Plan:  Flank pain, unspecified laterality -     POCT urinalysis dipstick     Assessment and Plan Assessment & Plan Flank and low back pain with sciatica Intermittent low back pain with sciatica, exacerbated by sitting and breathing, rated 8/10 at worst. No current pain. Previous back surgeries. Differential includes musculoskeletal pain and possible sciatica due to inflammation. No signs of  acute kidney injury or urinary tract infection based on current urinalysis. - Sent urine for culture to rule out infection. - Provided exercises for home management of back pain. - Advised use of heat application to affected area. - Recommended Tylenol  for pain management, avoiding NSAIDs due to blood pressure concerns. - Instructed to follow up if pain worsens or if new symptoms develop.  History of nephritis Significant history of nephritis with previous hospitalization. No current signs of nephritis based on urinalysis and absence of fever or urinary symptoms. - Ensure adequate hydration to support kidney function. - Monitor for any new symptoms suggestive of nephritis recurrence.      No follow-ups on file.    Curtis DELENA Boom, FNP

## 2024-08-20 DIAGNOSIS — I2510 Atherosclerotic heart disease of native coronary artery without angina pectoris: Secondary | ICD-10-CM | POA: Diagnosis not present

## 2024-08-20 DIAGNOSIS — I700 Atherosclerosis of aorta: Secondary | ICD-10-CM | POA: Diagnosis not present

## 2024-08-20 DIAGNOSIS — M79672 Pain in left foot: Secondary | ICD-10-CM | POA: Diagnosis not present

## 2024-08-20 DIAGNOSIS — M19072 Primary osteoarthritis, left ankle and foot: Secondary | ICD-10-CM | POA: Diagnosis not present

## 2024-08-21 LAB — URINE CULTURE

## 2024-08-22 ENCOUNTER — Ambulatory Visit: Payer: Self-pay | Admitting: Family Medicine

## 2024-08-25 ENCOUNTER — Other Ambulatory Visit: Payer: Self-pay | Admitting: Internal Medicine

## 2024-09-15 ENCOUNTER — Other Ambulatory Visit (INDEPENDENT_AMBULATORY_CARE_PROVIDER_SITE_OTHER)

## 2024-09-15 DIAGNOSIS — E785 Hyperlipidemia, unspecified: Secondary | ICD-10-CM

## 2024-09-15 DIAGNOSIS — M25552 Pain in left hip: Secondary | ICD-10-CM | POA: Diagnosis not present

## 2024-09-15 DIAGNOSIS — R739 Hyperglycemia, unspecified: Secondary | ICD-10-CM | POA: Diagnosis not present

## 2024-09-15 DIAGNOSIS — Z9889 Other specified postprocedural states: Secondary | ICD-10-CM | POA: Diagnosis not present

## 2024-09-15 DIAGNOSIS — M47816 Spondylosis without myelopathy or radiculopathy, lumbar region: Secondary | ICD-10-CM | POA: Diagnosis not present

## 2024-09-15 DIAGNOSIS — M7632 Iliotibial band syndrome, left leg: Secondary | ICD-10-CM | POA: Diagnosis not present

## 2024-09-15 DIAGNOSIS — M7062 Trochanteric bursitis, left hip: Secondary | ICD-10-CM | POA: Diagnosis not present

## 2024-09-15 DIAGNOSIS — M1612 Unilateral primary osteoarthritis, left hip: Secondary | ICD-10-CM | POA: Diagnosis not present

## 2024-09-15 DIAGNOSIS — E0590 Thyrotoxicosis, unspecified without thyrotoxic crisis or storm: Secondary | ICD-10-CM

## 2024-09-15 LAB — BASIC METABOLIC PANEL WITH GFR
BUN: 19 mg/dL (ref 6–23)
CO2: 30 meq/L (ref 19–32)
Calcium: 9.7 mg/dL (ref 8.4–10.5)
Chloride: 100 meq/L (ref 96–112)
Creatinine, Ser: 0.7 mg/dL (ref 0.40–1.20)
GFR: 83.44 mL/min (ref >60.00)
Glucose, Bld: 94 mg/dL (ref 70–99)
Potassium: 4.1 meq/L (ref 3.5–5.1)
Sodium: 138 meq/L (ref 135–145)

## 2024-09-15 LAB — HEMOGLOBIN A1C: Hgb A1c MFr Bld: 6 % (ref 4.6–6.5)

## 2024-09-15 LAB — HEPATIC FUNCTION PANEL
ALT: 14 U/L (ref 0–35)
AST: 15 U/L (ref 0–37)
Albumin: 4.3 g/dL (ref 3.5–5.2)
Alkaline Phosphatase: 74 U/L (ref 39–117)
Bilirubin, Direct: 0.1 mg/dL (ref 0.0–0.3)
Total Bilirubin: 0.8 mg/dL (ref 0.2–1.2)
Total Protein: 6.8 g/dL (ref 6.0–8.3)

## 2024-09-15 LAB — LIPID PANEL
Cholesterol: 203 mg/dL — ABNORMAL HIGH (ref 0–200)
HDL: 58.7 mg/dL (ref >39.00)
LDL Cholesterol: 117 mg/dL — ABNORMAL HIGH (ref 0–99)
NonHDL: 144.28
Total CHOL/HDL Ratio: 3
Triglycerides: 135 mg/dL (ref 0.0–149.0)
VLDL: 27 mg/dL (ref 0.0–40.0)

## 2024-09-23 ENCOUNTER — Telehealth: Payer: Self-pay

## 2024-09-23 NOTE — Telephone Encounter (Signed)
 Copied from CRM [id number]. Topic: Clinical - Lab/Test Results >> Sep 22, 2024  4:43 PM China J wrote: Reason for CRM: Patient is calling for test results. She will follow up at her next appointment.

## 2024-10-19 ENCOUNTER — Ambulatory Visit: Payer: Self-pay

## 2024-10-19 NOTE — Telephone Encounter (Signed)
 FYI Only or Action Required?: FYI only for provider: urgent care advised.  Patient was last seen in primary care on 08/19/2024 by Wellington Curtis LABOR, FNP.  Called Nurse Triage reporting Cough, Fatigue, and Shortness of Breath.  Symptoms began 5 days ago.  Interventions attempted: OTC medications: Zicam.  Symptoms are: gradually worsening.  Triage Disposition: See HCP Within 4 Hours (Or PCP Triage)  Patient/caregiver understands and will follow disposition?: Yes            Copied from CRM #[id number]. Topic: Clinical - Red Word Triage >> Oct 19, 2024 10:14 AM Suzen RAMAN wrote: Red Word that prompted transfer to Nurse Triage: cough and scratchy throat... productive cough not improving..   flu/covid (-).SABRA stomach concern, burping and gas no upset stomach.SABRA deep cough; requesting an appt Reason for Disposition  [1] MILD difficulty breathing (e.g., minimal/no SOB at rest, SOB with walking, pulse < 100) AND [2] still present when not coughing  Answer Assessment - Initial Assessment Questions 1. ONSET: When did the cough begin?      5 days ago.  2. SEVERITY: How bad is the cough today?      Worsened x3 days. Started as hacking cough with scratchy throat and is now a deep, dry cough. Causing abdomen to be sore from coughing so much.  3. SPUTUM: Describe the color of your sputum (e.g., none, dry cough; clear, white, yellow, green)     Dry.  4. HEMOPTYSIS: Are you coughing up any blood? If Yes, ask: How much? (e.g., flecks, streaks, tablespoons, etc.)     No.  5. DIFFICULTY BREATHING: Are you having difficulty breathing? If Yes, ask: How bad is it? (e.g., mild, moderate, severe)      Yes, she states probably a little bit and states sometimes when sitting down but mostly when I'm up  6. FEVER: Do you have a fever? If Yes, ask: What is your temperature, how was it measured, and when did it start?     No.  7. CARDIAC HISTORY: Do you have any history of heart  disease? (e.g., heart attack, congestive heart failure)      Hypertension Carotid artery disease PAC (premature atrial contraction) PVC (premature ventricular contraction) Bilateral carotid artery stenosis Aortic atherosclerosis Mitral valve insufficiency Chronic heart failure with preserved ejection fraction (HFpEF) (HCC) Coronary artery disease involving native coronary artery of native heart without angina pectoris   8. LUNG HISTORY: Do you have any history of lung disease?  (e.g., pulmonary embolus, asthma, emphysema)     No.  9. PE RISK FACTORS: Do you have a history of blood clots? (or: recent major surgery, recent prolonged travel, bedridden)     No.  10. OTHER SYMPTOMS: Do you have any other symptoms? (e.g., runny nose, wheezing, chest pain)       No chest pain or fever. She states maybe a little bit wheezing mostly at night. Fatigue x 2 days, headache across eyebrows intermittent. Sneezing. Burping and flatulence.  11. PREGNANCY: Is there any chance you are pregnant? When was your last menstrual period?       N/A  12. TRAVEL: Have you traveled out of the country in the last month? (e.g., travel history, exposures)       No.  Home flu and COVID test were negative.  Protocols used: Cough - Acute Non-Productive-A-AH

## 2024-10-20 NOTE — Telephone Encounter (Signed)
 Please call pt and notify her that given her persistent symptoms including persistent cough, agree with evaluation to be able to confirm etiology and how best to treat.

## 2024-10-21 ENCOUNTER — Ambulatory Visit
Admission: RE | Admit: 2024-10-21 | Discharge: 2024-10-21 | Disposition: A | Discharge status: Home / Self Care | Source: Ambulatory Visit

## 2024-10-21 ENCOUNTER — Ambulatory Visit

## 2024-10-21 ENCOUNTER — Ambulatory Visit: Payer: Self-pay | Admitting: Nurse Practitioner

## 2024-10-21 VITALS — BP 136/61 | HR 57 | Temp 98.3°F | Resp 16 | Wt 149.0 lb

## 2024-10-21 DIAGNOSIS — J209 Acute bronchitis, unspecified: Secondary | ICD-10-CM | POA: Diagnosis not present

## 2024-10-21 DIAGNOSIS — R051 Acute cough: Secondary | ICD-10-CM | POA: Diagnosis not present

## 2024-10-21 MED ORDER — ALBUTEROL SULFATE HFA 108 (90 BASE) MCG/ACT IN AERS: 1.0000 | INHALATION_SPRAY | Freq: Four times a day (QID) | RESPIRATORY_TRACT | 0 refills | Status: AC | PRN

## 2024-10-21 MED ORDER — AMOXICILLIN-POT CLAVULANATE 875-125 MG PO TABS: 1.0000 | ORAL_TABLET | Freq: Two times a day (BID) | ORAL | 0 refills | Status: DC

## 2024-10-21 NOTE — ED Triage Notes (Signed)
" °  Deep Cough and lack of energy.  Symptoms started 10/14/2024.  Symptoms have included:  tickle in throat, mild headache, lack of appetite, sore throat, deep hacking cough.  Home test for COVID was negative. - Entered by patient   "

## 2024-10-21 NOTE — Discharge Instructions (Signed)
 Start Augmentin antibiotic as prescribed.  You may use your albuterol  inhaler as needed for wheezing or shortness of breath.  May take over-the-counter cough medicine as needed.  Lots of rest and fluids.  Please follow-up with your PCP in 2 to 3 days for recheck.  Please go to the ER for any worsening symptoms.  Hope you feel better soon!

## 2024-10-21 NOTE — ED Provider Notes (Addendum)
 " [facility]    CSN: [id number] Arrival date & time: 10/21/24  0849      History   Chief Complaint Chief Complaint  Patient presents with   Cough    Deep Cough and lack of energy.  Symptoms started 10/14/2024.  Symptoms have included:  tickle in throat, mild headache, lack of appetite, sore throat, deep hacking cough.  Home test for COVID was negative. - Entered by patient    HPI Kristen Strickland is a 79 y.o. female  presents for evaluation of URI symptoms for 7 days. Patient reports associated symptoms of cough, congestion, wheezing, SOB. Denies N/V/D, fever, ST, ear pain, body aches. Patient does not have a hx of asthma. Patient is not an active smoker.   Reports no kown sick contacts.  Pt has taken zycam OTC for symptoms.  Reports a negative home COVID test.  Pt has no other concerns at this time.    Cough Associated symptoms: shortness of breath and wheezing     Past Medical History:  Diagnosis Date   Aortic atherosclerosis    Arthritis    Carotid arterial disease    a. 11/2017 Carotid U/S: RICA 1-39%, LICA 40-59%.    Chronic fatigue    Chronic heart failure with preserved ejection fraction (HCC)    Chronic leg pain    Complete tear of right rotator cuff    Coronary artery disease involving native coronary artery of native heart without angina pectoris    DDD (degenerative disc disease), lumbar    Dyspnea on exertion    a. 02/2017 Echo: EF 60-65%, no rwma, mild MR. Nl RV size/fxn. Nl PASP; b. 02/2017 MV: small, mild, fixed basal inferolateral defect - ? artifact vs scar. No ischemia. EF >65%.   Hypercholesterolemia    Hypertension    Insomnia    Long-term use of aspirin  therapy    Mild cognitive impairment    Palpitations    a. 02/2017 Holter: Avg HR 66 (51-115), rare PACs, four brief runs of PAT - up to 5 beats, max rate 157. Rare isolated PVCs. No sustained arrhythmias; b. 03/2018 24h Holter: Rare PACs, 3 beat run PAT (122 bpm), PVCs (2% of beats).    Radiculopathy, lumbar region    Sinus bradycardia    Skin cancer    Spondylolisthesis of lumbosacral region     Patient Active Problem List   Diagnosis Date Noted   Hip pain 08/07/2024   Foot deformity, left 08/06/2024   Pyelonephritis 04/24/2024   Cramps of lower extremity 07/26/2023   Hearing loss 06/19/2023   Numbness and tingling of both lower extremities 06/19/2023   Statin myopathy 03/23/2023   Memory change 03/23/2023   Senile purpura 03/18/2023   Leg pain 01/28/2023   Knee pain, left 01/28/2023   Shoulder pain 11/13/2022   Joint pain 08/18/2022   Anemia 08/08/2022   Chronic heart failure with preserved ejection fraction (HFpEF) (HCC) 01/09/2022   Coronary artery disease involving native coronary artery of native heart without angina pectoris 01/09/2022   Mitral valve insufficiency 07/26/2021   Precordial pain 07/26/2021   Calculus of gallbladder without cholecystitis without obstruction 07/26/2021   Aortic atherosclerosis 05/18/2021   Body mass index (BMI) 22.0-22.9, adult 08/31/2020   Arthrodesis status 08/10/2020   Abdominal pain 05/24/2020   Spondylolisthesis, lumbosacral region 02/02/2020   History of lumbar fusion 11/30/2019   Bilateral leg pain 08/13/2019   Sleeping difficulties 06/29/2019   Left foot pain 05/30/2019   Hyperglycemia 05/30/2019  PAC (premature atrial contraction) 01/27/2019   PVC (premature ventricular contraction) 01/27/2019   Left leg swelling 01/27/2019   Bilateral carotid artery stenosis 01/27/2019   Degenerative spondylolisthesis 11/10/2018   Decreased sense of taste 10/12/2018   Impingement syndrome, shoulder, right 04/03/2018   Swelling of right hand 11/29/2017   Fatigue 11/29/2017   Palpitations 06/11/2017   DOE (dyspnea on exertion) 09/30/2016   Carotid artery disease 09/17/2015   Back pain 08/13/2015   Health care maintenance 02/08/2015   Stress 07/03/2014   Arthritis, degenerative 05/03/2014   Right knee pain 03/27/2014    Menopausal symptoms 02/27/2014   Dizziness 01/08/2014   Right shoulder pain 04/26/2013   Neck pain 04/07/2013   Hypertension 08/23/2012   Hyperlipidemia LDL goal <70 08/23/2012    Past Surgical History:  Procedure Laterality Date   ABDOMINAL EXPOSURE N/A 11/10/2018   Procedure: ABDOMINAL EXPOSURE;  Surgeon: Eliza Lonni RAMAN, MD;  Location[facility] OR;  Service: Vascular;  Laterality: N/A;   ABDOMINAL HYSTERECTOMY  10/21/1973   ANTERIOR CERVICAL DECOMP/DISCECTOMY FUSION N/A 04/09/2013   Procedure: ANTERIOR CERVICAL DECOMPRESSION/DISCECTOMY FUSION 2 LEVELS;  Surgeon: Catalina CHRISTELLA Stains, MD;  Location: [facility];  Service: Neurosurgery;  Laterality: N/A;  Cervical five-six Cervical six-seven  Anterior cervical decompression/diskectomy/fusion   ANTERIOR LUMBAR FUSION N/A 11/10/2018   Procedure: Anterior Lumbar Interbody Fusion-Lumbar five-Sacral one;  Surgeon: Louis Shove, MD;  Location: [facility] OR;  Service: Neurosurgery;  Laterality: N/A;   BACK SURGERY     ruptured disc   RECONSTRUCTION OF NOSE     REVERSE SHOULDER ARTHROPLASTY Right 02/05/2024   Procedure: ARTHROPLASTY, SHOULDER, TOTAL, REVERSE;  Surgeon: Edie Norleen PARAS, MD;  Location: [facility] ORS;  Service: Orthopedics;  Laterality: Right;   skin lesion removed Left 04/2024   left arm    OB History   No obstetric history on file.      Home Medications    Prior to Admission medications  Medication Sig Start Date End Date Taking? Authorizing Provider  acetaminophen  (TYLENOL ) 650 MG CR tablet Take 650 mg by mouth in the morning.   Yes [provider]  albuterol  (VENTOLIN  HFA) 108 (90 Base) MCG/ACT inhaler Inhale 1-2 puffs into the lungs every 6 (six) hours as needed for wheezing or shortness of breath. 10/21/24  Yes Cadell Gabrielson, Jodi R, NP  amLODipine  (NORVASC ) 2.5 MG tablet TAKE ONE TABLET BY MOUTH ONCE DAILY 08/26/24  Yes Scott, Charlene, MD  amoxicillin-clavulanate (AUGMENTIN) 875-125 MG tablet Take 1 tablet by mouth every 12  (twelve) hours. 10/21/24  Yes Shavonta Gossen, Jodi R, NP  aspirin  EC 81 MG tablet Take 81 mg by mouth daily with lunch.   Yes [provider]  ezetimibe  (ZETIA ) 10 MG tablet TAKE ONE TABLET BY MOUTH ONCE DAILY 08/26/24  Yes Glendia Shad, MD  gabapentin  (NEURONTIN ) 100 MG capsule TAKE ONE CAPSULE BY MOUTH EVERY DAY 07/30/24  Yes Glendia Shad, MD  losartan -hydrochlorothiazide  (HYZAAR) 100-25 MG tablet TAKE ONE TABLET BY MOUTH ONCE DAILY 06/15/24  Yes Glendia Shad, MD  Multiple Vitamins-Minerals (CENTRUM ADULTS PO) Take 1 capsule by mouth in the morning.   Yes [provider]  pantoprazole  (PROTONIX ) 40 MG tablet TAKE ONE TABLET BY MOUTH EVERY DAY 06/15/24  Yes Glendia Shad, MD  pravastatin  (PRAVACHOL ) 10 MG tablet Take 1 tablet (10 mg total) by mouth every Monday, Wednesday, and Friday. 05/14/24  Yes Glendia Shad, MD  SODIUM FLUORIDE 5000 PPM 1.1 % PSTE daily. 01/28/24  Yes [provider]    Family History Family  History  Problem Relation Age of Onset   Cancer Mother        ovarian/uterine   Heart disease Mother    Heart attack Mother    Stroke Mother    Colon cancer Father    Dementia Father    COPD Brother    Congestive Heart Failure Brother    Stroke Daughter    Heart attack Daughter    Suicidality Brother    Breast cancer Neg Hx     Social History Social History[1]   Allergies   Patient has no known allergies.   Review of Systems Review of Systems  HENT:  Positive for congestion.   Respiratory:  Positive for cough, shortness of breath and wheezing.      Physical Exam Triage Vital Signs ED Triage Vitals  Encounter Vitals Group     BP 10/21/24 0909 136/61     Girls Systolic BP Percentile --      Girls Diastolic BP Percentile --      Boys Systolic BP Percentile --      Boys Diastolic BP Percentile --      Pulse Rate 10/21/24 0909 (!) 57     Resp 10/21/24 0909 16     Temp 10/21/24 0909 98.3 F (36.8 C)     Temp Source 10/21/24 0909 Oral      SpO2 10/21/24 0909 96 %     Weight 10/21/24 0908 149 lb (67.6 kg)     Height --      Head Circumference --      Peak Flow --      Pain Score 10/21/24 0909 0     Pain Loc --      Pain Education --      Exclude from Growth Chart --    No data found.  Updated Vital Signs BP 136/61 (BP Location: Left Arm)   Pulse (!) 57   Temp 98.3 F (36.8 C) (Oral)   Resp 16   Wt 149 lb (67.6 kg)   SpO2 96%   BMI 25.58 kg/m   Visual Acuity Right Eye Distance:   Left Eye Distance:   Bilateral Distance:    Right Eye Near:   Left Eye Near:    Bilateral Near:     Physical Exam Vitals and nursing note reviewed.  Constitutional:      General: She is not in acute distress.    Appearance: She is well-developed. She is not ill-appearing.  HENT:     Head: Normocephalic and atraumatic.     Right Ear: Tympanic membrane and ear canal normal.     Left Ear: Tympanic membrane and ear canal normal.     Nose: Congestion present.     Mouth/Throat:     Mouth: Mucous membranes are moist.     Pharynx: Oropharynx is clear. Uvula midline. No oropharyngeal exudate or posterior oropharyngeal erythema.     Tonsils: No tonsillar exudate or tonsillar abscesses.  Eyes:     Conjunctiva/sclera: Conjunctivae normal.     Pupils: Pupils are equal, round, and reactive to light.  Cardiovascular:     Rate and Rhythm: Regular rhythm. Bradycardia present.     Heart sounds: Normal heart sounds.  Pulmonary:     Effort: Pulmonary effort is normal.     Breath sounds: No wheezing, rhonchi or rales.  Musculoskeletal:     Cervical back: Normal range of motion and neck supple.  Lymphadenopathy:     Cervical: No cervical adenopathy.  Skin:  General: Skin is warm and dry.  Neurological:     General: No focal deficit present.     Mental Status: She is alert and oriented to person, place, and time.  Psychiatric:        Mood and Affect: Mood normal.        Behavior: Behavior normal.      UC Treatments /  Results  Labs (all labs ordered are listed, but only abnormal results are displayed) Labs Reviewed - No data to display  EKG   Radiology No results found.  Procedures Procedures (including critical care time)  Medications Ordered in UC Medications - No data to display  Initial Impression / Assessment and Plan / UC Course  I have reviewed the triage vital signs and the nursing notes.  Pertinent labs & imaging results that were available during my care of the patient were reviewed by me and considered in my medical decision making (see chart for details).     Reviewed exam and symptoms with patient.  No red flags.  Wet read of x-ray without obvious consolidation, will contact for any positive results based on radiology overread once available.  Will start Augmentin and albuterol  inhaler.  She declines Rx cough medicine and will use OTC cough medicine as needed.  Encourage rest fluids and PCP follow-up 2 to 3 days for recheck.  ER precautions reviewed. Final Clinical Impressions(s) / UC Diagnoses   Final diagnoses:  Acute cough  Acute bronchitis, unspecified organism     Discharge Instructions      Start Augmentin antibiotic as prescribed.  You may use your albuterol  inhaler as needed for wheezing or shortness of breath.  May take over-the-counter cough medicine as needed.  Lots of rest and fluids.  Please follow-up with your PCP in 2 to 3 days for recheck.  Please go to the ER for any worsening symptoms.  Hope you feel better soon!     ED Prescriptions     Medication Sig Dispense Auth. Provider   amoxicillin-clavulanate (AUGMENTIN) 875-125 MG tablet Take 1 tablet by mouth every 12 (twelve) hours. 14 tablet Elohim Brune, Jodi R, NP   albuterol  (VENTOLIN  HFA) 108 (90 Base) MCG/ACT inhaler Inhale 1-2 puffs into the lungs every 6 (six) hours as needed for wheezing or shortness of breath. 1 each Loreda Myla SAUNDERS, NP      PDMP not reviewed this encounter.    Loreda Myla SAUNDERS,  NP 10/21/24 [phone]     [1]  Social History Tobacco Use   Smoking status: Never   Smokeless tobacco: Never  Vaping Use   Vaping status: Never Used  Substance Use Topics   Alcohol use: Not Currently    Alcohol/week: 0.0 standard drinks of alcohol    Comment: rarely   Drug use: No     Loreda Myla SAUNDERS, NP 10/21/24 0950  "

## 2024-10-25 ENCOUNTER — Ambulatory Visit: Admitting: Speech Pathology

## 2024-10-28 ENCOUNTER — Ambulatory Visit: Attending: Neurology | Admitting: Speech Pathology

## 2024-10-28 DIAGNOSIS — R41841 Cognitive communication deficit: Secondary | ICD-10-CM | POA: Insufficient documentation

## 2024-10-28 NOTE — Therapy (Signed)
 " OUTPATIENT SPEECH LANGUAGE PATHOLOGY  COGNITION EVALUATION   Patient Name: Kristen Strickland MRN: [id number] DOB:1946-10-03, 79 y.o., female Today's Date: 10/28/2024  PCP: Allena Hamilton, MD REFERRING PROVIDER: Jannett Fairly, MD   End of Session - 10/28/24 1923     Visit Number 1    Number of Visits 25    Date for Recertification  01/20/25    Authorization Type Medicare Part A/Part B    Progress Note Due on Visit 10    SLP Start Time 0930    SLP Stop Time  1015    SLP Time Calculation (min) 45 min    Activity Tolerance Patient tolerated treatment well          Past Medical History:  Diagnosis Date   Aortic atherosclerosis    Arthritis    Carotid arterial disease    a. 11/2017 Carotid U/S: RICA 1-39%, LICA 40-59%.    Chronic fatigue    Chronic heart failure with preserved ejection fraction (HCC)    Chronic leg pain    Complete tear of right rotator cuff    Coronary artery disease involving native coronary artery of native heart without angina pectoris    DDD (degenerative disc disease), lumbar    Dyspnea on exertion    a. 02/2017 Echo: EF 60-65%, no rwma, mild MR. Nl RV size/fxn. Nl PASP; b. 02/2017 MV: small, mild, fixed basal inferolateral defect - ? artifact vs scar. No ischemia. EF >65%.   Hypercholesterolemia    Hypertension    Insomnia    Long-term use of aspirin  therapy    Mild cognitive impairment    Palpitations    a. 02/2017 Holter: Avg HR 66 (51-115), rare PACs, four brief runs of PAT - up to 5 beats, max rate 157. Rare isolated PVCs. No sustained arrhythmias; b. 03/2018 24h Holter: Rare PACs, 3 beat run PAT (122 bpm), PVCs (2% of beats).   Radiculopathy, lumbar region    Sinus bradycardia    Skin cancer    Spondylolisthesis of lumbosacral region    Past Surgical History:  Procedure Laterality Date   ABDOMINAL EXPOSURE N/A 11/10/2018   Procedure: ABDOMINAL EXPOSURE;  Surgeon: Eliza Lonni RAMAN, MD;  Location: [facility] OR;  Service: Vascular;  Laterality: N/A;    ABDOMINAL HYSTERECTOMY  10/21/1973   ANTERIOR CERVICAL DECOMP/DISCECTOMY FUSION N/A 04/09/2013   Procedure: ANTERIOR CERVICAL DECOMPRESSION/DISCECTOMY FUSION 2 LEVELS;  Surgeon: Catalina CHRISTELLA Stains, MD;  Location: [facility];  Service: Neurosurgery;  Laterality: N/A;  Cervical five-six Cervical six-seven  Anterior cervical decompression/diskectomy/fusion   ANTERIOR LUMBAR FUSION N/A 11/10/2018   Procedure: Anterior Lumbar Interbody Fusion-Lumbar five-Sacral one;  Surgeon: Louis Shove, MD;  Location: [facility] OR;  Service: Neurosurgery;  Laterality: N/A;   BACK SURGERY     ruptured disc   RECONSTRUCTION OF NOSE     REVERSE SHOULDER ARTHROPLASTY Right 02/05/2024   Procedure: ARTHROPLASTY, SHOULDER, TOTAL, REVERSE;  Surgeon: Edie Norleen PARAS, MD;  Location: [facility] ORS;  Service: Orthopedics;  Laterality: Right;   skin lesion removed Left 04/2024   left arm   Patient Active Problem List   Diagnosis Date Noted   Hip pain 08/07/2024   Foot deformity, left 08/06/2024   Pyelonephritis 04/24/2024   Cramps of lower extremity 07/26/2023   Hearing loss 06/19/2023   Numbness and tingling of both lower extremities 06/19/2023   Statin myopathy 03/23/2023   Memory change 03/23/2023   Senile purpura 03/18/2023   Leg pain 01/28/2023   Knee pain, left 01/28/2023  Shoulder pain 11/13/2022   Joint pain 08/18/2022   Anemia 08/08/2022   Chronic heart failure with preserved ejection fraction (HFpEF) (HCC) 01/09/2022   Coronary artery disease involving native coronary artery of native heart without angina pectoris 01/09/2022   Mitral valve insufficiency 07/26/2021   Precordial pain 07/26/2021   Calculus of gallbladder without cholecystitis without obstruction 07/26/2021   Aortic atherosclerosis 05/18/2021   Body mass index (BMI) 22.0-22.9, adult 08/31/2020   Arthrodesis status 08/10/2020   Abdominal pain 05/24/2020   Spondylolisthesis, lumbosacral region 02/02/2020   History of lumbar fusion 11/30/2019    Bilateral leg pain 08/13/2019   Sleeping difficulties 06/29/2019   Left foot pain 05/30/2019   Hyperglycemia 05/30/2019   PAC (premature atrial contraction) 01/27/2019   PVC (premature ventricular contraction) 01/27/2019   Left leg swelling 01/27/2019   Bilateral carotid artery stenosis 01/27/2019   Degenerative spondylolisthesis 11/10/2018   Decreased sense of taste 10/12/2018   Impingement syndrome, shoulder, right 04/03/2018   Swelling of right hand 11/29/2017   Fatigue 11/29/2017   Palpitations 06/11/2017   DOE (dyspnea on exertion) 09/30/2016   Carotid artery disease 09/17/2015   Back pain 08/13/2015   Health care maintenance 02/08/2015   Stress 07/03/2014   Arthritis, degenerative 05/03/2014   Right knee pain 03/27/2014   Menopausal symptoms 02/27/2014   Dizziness 01/08/2014   Right shoulder pain 04/26/2013   Neck pain 04/07/2013   Hypertension 08/23/2012   Hyperlipidemia LDL goal <70 08/23/2012    ONSET DATE: date of referral 10/08/2024 with symptoms starting  ~ 2022  REFERRING DIAG:  G31.84 (ICD-10-CM) - Mild cognitive impairment    THERAPY DIAG:  Cognitive communication deficit  Rationale for Evaluation and Treatment Rehabilitation  SUBJECTIVE:   SUBJECTIVE STATEMENT: Pt pleasant, eager Pt accompanied by: self  PERTINENT HISTORY: Pt is a 79 year old female with mild cognitive impairment due to Alzheimer's disease, increase agitation/frustration/crying, + reduce beta amyloid 42/40 and elevated P-tau 181, history of borderline low Vitamin D, HTN  SLUMS 06/12/2023 20/30  MRI 06/20/2023 Brain: No acute infarction, hemorrhage, hydrocephalus, extra-axial collection or mass lesion. Normal brain volume and white matter appearance. No abnormal mineralization.   PAIN:  Are you having pain? No   FALLS: Has patient fallen in last 6 months?  No  LIVING ENVIRONMENT: Lives with: lives with their spouse Lives in: House/apartment  PLOF:  Level of  assistance: Independent with ADLs, Independent with IADLs Employment: Retired   PATIENT GOALS   to improve cognitive function to maintain independence   OBJECTIVE:   COGNITIVE COMMUNICATION Overall cognitive status: Impaired: Areas of impairment:  Attention: Impaired: Selective, Alternating, Divided Memory: Impaired: Working Teacher, Music term Prospective  Functional Impairments: pt reports increased emotional response to things, deficits in short term memory prompt her to ask repetitive questions. She reports if someone says something to me an hour or two later I might be able to remember what it was. She continues to manage her medicine and money, hasn't missed appts d/t use of calendar, continues to drive but does state  I have to stop and think sometimes when driving and I put notes beside highly visiable locations in house. Intermittent difficulty remembering lunchtime medicines, was late paying one bill recently but reports this is atypical   AUDITORY COMPREHENSION  Overall auditory comprehension: Appears intact YES/NO questions: Appears intact Following directions: Appears intact Conversation: Complex  READING COMPREHENSION: Intact  EXPRESSION: verbal  VERBAL EXPRESSION:   Overall verbal expression: Appears intact Level of generative/spontaneous verbalization: conversation Automatic speech:  name: intact and social response: intact  Repetition: Appears intact Naming: Confrontation: 76-100% and Divergent: 51-75% Pragmatics: Appears intact Interfering components: attention Effective technique: semantic cues Non-verbal means of communication: N/A  WRITTEN EXPRESSION: Dominant hand: right Written expression: Appears intact  ORAL MOTOR EXAMINATION Facial : WFL Lingual: WFL Velum: WFL Mandible: WFL Cough: WFL Voice: WFL  MOTOR SPEECH: Overall motor speech: Appears intact Respiration: diaphragmatic/abdominal breathing Phonation: normal Resonance: WFL Articulation:  Appears intact Intelligibility: Intelligible Motor planning: Appears intact  STANDARDIZED ASSESSMENTS: Addenbrooke's Cognitive Examination - ACE III The Addenbrooke's Cognitive Examination-III (ACE-III) is a brief cognitive test that assesses five cognitive domains. The total score is 100 with higher scores indicating better cognitive functioning. Cut off scores of 88 and 82 1are recommended for suspicion of dementia (88 has sensitivity of 1.00 and specificity of 0.96, 82 has sensitivity of 0.93 and specificity of 1.00). American Version A  Attention 16/18  Memory 17/26  Fluency 9/14  Language 23/26  Visuospatial 13/16  TOTAL ACE- III Score 78/100     PATIENT REPORTED OUTCOME MEASURES (PROM): To be completed over the next 3 sessions   TODAY'S TREATMENT:  Skilled education provided on current diagnoses, functional independence, safety and role of family within ST services   PATIENT EDUCATION: Education details: ST POC Person educated: Patient Education method: Explanation Education comprehension: needs further education   HOME EXERCISE PROGRAM:   N/A   GOALS:  Goals reviewed with patient? Yes  SHORT TERM GOALS: Target date: 10 sessions  With Mod I, patient will use strategies (ie., white board, daily planner/calendar, Apps on phone) to improve memory for important information with 90% accuracy.  Baseline: Goal status: INITIAL  2.   Pt. will recall 2/4 memory strategies (w-write it, r-repeat, a-associate it, p-picture) and utilize strategies in structured and functional memory tasks to recall 80% of presented information given min to moderate cues.  Baseline:  Goal status: INITIAL  3.  With Mod I, patient will report <2 missed (medications/bills/appointments) over 2 week period with use of external memory aids/strategies.  Baseline:  Goal status: INITIAL   LONG TERM GOALS: Target date: 01/20/2025  Patient will improve basic cognitive-communication function >80%  accuracy with the use of compensatory strategies in order to improve carryover from day to day, increase independence with daily living tasks, and to improve safety. Baseline:  Goal status: INITIAL   ASSESSMENT:  CLINICAL IMPRESSION: Patient is a 79 y.o. female who was seen today for a cognitive communication evaluation in the setting of Alzheimer's Disease. Pt presents with mild amnestic cognitive impairment with moderate deficits observed in memory.   OBJECTIVE IMPAIRMENTS include attention, memory, awareness, and executive functioning. These impairments are limiting patient from managing medications, managing appointments, managing finances, household responsibilities, ADLs/IADLs, and effectively communicating at home and in community. Factors affecting potential to achieve goals and functional outcome are ability to learn/carryover information and medical prognosis. Patient will benefit from skilled SLP services to address above impairments and improve overall function.  REHAB POTENTIAL: Good  PLAN: SLP FREQUENCY: 1-2x/week  SLP DURATION: 12 weeks  PLANNED INTERVENTIONS: Internal/external aids, Functional tasks, SLP instruction and feedback, Compensatory strategies, and Patient/family education   Dynastee Brummell B. Rubbie, M.S., CCC-SLP, CBIS Speech-Language Pathologist Certified Brain Injury Specialist [facility]  [facility] [phone] Ascom [phone] Fax [phone]  "

## 2024-11-01 ENCOUNTER — Other Ambulatory Visit: Payer: Self-pay | Admitting: Internal Medicine

## 2024-11-01 MED ORDER — GABAPENTIN 100 MG PO CAPS: 100.0000 mg | ORAL_CAPSULE | Freq: Every day | ORAL | 0 refills | Status: AC

## 2024-11-01 NOTE — Telephone Encounter (Signed)
 Copied from CRM #[id number]. Topic: Clinical - Medication Refill >> Nov 01, 2024  4:14 PM Thersia C wrote: Medication: gabapentin  (NEURONTIN ) 100 MG capsule  Has the patient contacted their pharmacy? Yes (Agent: If no, request that the patient contact the pharmacy for the refill. If patient does not wish to contact the pharmacy document the reason why and proceed with request.) (Agent: If yes, when and what did the pharmacy advise?)  This is the patient's preferred pharmacy:  [facility] - [place], [place] - [address] [place] [address] Phone: [phone] Fax: [phone]  Is this the correct pharmacy for this prescription? Yes If no, delete pharmacy and type the correct one.   Has the prescription been filled recently? No  Is the patient out of the medication? Yes  Has the patient been seen for an appointment in the last year OR does the patient have an upcoming appointment? Yes  Can we respond through MyChart? Yes  Agent: Please be advised that Rx refills may take up to 3 business days. We ask that you follow-up with your pharmacy.

## 2024-11-03 ENCOUNTER — Ambulatory Visit: Admitting: Speech Pathology

## 2024-11-05 ENCOUNTER — Ambulatory Visit: Admitting: Speech Pathology

## 2024-11-05 DIAGNOSIS — R41841 Cognitive communication deficit: Secondary | ICD-10-CM

## 2024-11-05 NOTE — Therapy (Signed)
 " OUTPATIENT SPEECH LANGUAGE PATHOLOGY  COGNITION TREATMENT NOTE  Patient Name: Kristen Strickland MRN: [id number] DOB:1946/03/25, 79 y.o., female Today's Date: 11/05/2024  PCP: Allena Hamilton, MD REFERRING PROVIDER: Jannett Fairly, MD   End of Session - 11/05/24 [phone]     Visit Number 2    Number of Visits 25    Date for Recertification  01/20/25    Authorization Type Medicare Part A/Part B    Progress Note Due on Visit 10    SLP Start Time 0845    SLP Stop Time  0930    SLP Time Calculation (min) 45 min    Activity Tolerance Patient tolerated treatment well          Past Medical History:  Diagnosis Date   Aortic atherosclerosis    Arthritis    Carotid arterial disease    a. 11/2017 Carotid U/S: RICA 1-39%, LICA 40-59%.    Chronic fatigue    Chronic heart failure with preserved ejection fraction (HCC)    Chronic leg pain    Complete tear of right rotator cuff    Coronary artery disease involving native coronary artery of native heart without angina pectoris    DDD (degenerative disc disease), lumbar    Dyspnea on exertion    a. 02/2017 Echo: EF 60-65%, no rwma, mild MR. Nl RV size/fxn. Nl PASP; b. 02/2017 MV: small, mild, fixed basal inferolateral defect - ? artifact vs scar. No ischemia. EF >65%.   Hypercholesterolemia    Hypertension    Insomnia    Long-term use of aspirin  therapy    Mild cognitive impairment    Palpitations    a. 02/2017 Holter: Avg HR 66 (51-115), rare PACs, four brief runs of PAT - up to 5 beats, max rate 157. Rare isolated PVCs. No sustained arrhythmias; b. 03/2018 24h Holter: Rare PACs, 3 beat run PAT (122 bpm), PVCs (2% of beats).   Radiculopathy, lumbar region    Sinus bradycardia    Skin cancer    Spondylolisthesis of lumbosacral region    Past Surgical History:  Procedure Laterality Date   ABDOMINAL EXPOSURE N/A 11/10/2018   Procedure: ABDOMINAL EXPOSURE;  Surgeon: Eliza Lonni RAMAN, MD;  Location: [facility] OR;  Service: Vascular;  Laterality: N/A;    ABDOMINAL HYSTERECTOMY  10/21/1973   ANTERIOR CERVICAL DECOMP/DISCECTOMY FUSION N/A 04/09/2013   Procedure: ANTERIOR CERVICAL DECOMPRESSION/DISCECTOMY FUSION 2 LEVELS;  Surgeon: Catalina CHRISTELLA Stains, MD;  Location: [facility];  Service: Neurosurgery;  Laterality: N/A;  Cervical five-six Cervical six-seven  Anterior cervical decompression/diskectomy/fusion   ANTERIOR LUMBAR FUSION N/A 11/10/2018   Procedure: Anterior Lumbar Interbody Fusion-Lumbar five-Sacral one;  Surgeon: Louis Shove, MD;  Location: [facility] OR;  Service: Neurosurgery;  Laterality: N/A;   BACK SURGERY     ruptured disc   RECONSTRUCTION OF NOSE     REVERSE SHOULDER ARTHROPLASTY Right 02/05/2024   Procedure: ARTHROPLASTY, SHOULDER, TOTAL, REVERSE;  Surgeon: Edie Norleen PARAS, MD;  Location: [facility] ORS;  Service: Orthopedics;  Laterality: Right;   skin lesion removed Left 04/2024   left arm   Patient Active Problem List   Diagnosis Date Noted   Hip pain 08/07/2024   Foot deformity, left 08/06/2024   Pyelonephritis 04/24/2024   Cramps of lower extremity 07/26/2023   Hearing loss 06/19/2023   Numbness and tingling of both lower extremities 06/19/2023   Statin myopathy 03/23/2023   Memory change 03/23/2023   Senile purpura 03/18/2023   Leg pain 01/28/2023   Knee pain, left 01/28/2023  Shoulder pain 11/13/2022   Joint pain 08/18/2022   Anemia 08/08/2022   Chronic heart failure with preserved ejection fraction (HFpEF) (HCC) 01/09/2022   Coronary artery disease involving native coronary artery of native heart without angina pectoris 01/09/2022   Mitral valve insufficiency 07/26/2021   Precordial pain 07/26/2021   Calculus of gallbladder without cholecystitis without obstruction 07/26/2021   Aortic atherosclerosis 05/18/2021   Body mass index (BMI) 22.0-22.9, adult 08/31/2020   Arthrodesis status 08/10/2020   Abdominal pain 05/24/2020   Spondylolisthesis, lumbosacral region 02/02/2020   History of lumbar fusion 11/30/2019    Bilateral leg pain 08/13/2019   Sleeping difficulties 06/29/2019   Left foot pain 05/30/2019   Hyperglycemia 05/30/2019   PAC (premature atrial contraction) 01/27/2019   PVC (premature ventricular contraction) 01/27/2019   Left leg swelling 01/27/2019   Bilateral carotid artery stenosis 01/27/2019   Degenerative spondylolisthesis 11/10/2018   Decreased sense of taste 10/12/2018   Impingement syndrome, shoulder, right 04/03/2018   Swelling of right hand 11/29/2017   Fatigue 11/29/2017   Palpitations 06/11/2017   DOE (dyspnea on exertion) 09/30/2016   Carotid artery disease 09/17/2015   Back pain 08/13/2015   Health care maintenance 02/08/2015   Stress 07/03/2014   Arthritis, degenerative 05/03/2014   Right knee pain 03/27/2014   Menopausal symptoms 02/27/2014   Dizziness 01/08/2014   Right shoulder pain 04/26/2013   Neck pain 04/07/2013   Hypertension 08/23/2012   Hyperlipidemia LDL goal <70 08/23/2012    ONSET DATE: date of referral 10/08/2024 with symptoms starting  ~ 2022  REFERRING DIAG:  G31.84 (ICD-10-CM) - Mild cognitive impairment    THERAPY DIAG:  Cognitive communication deficit  Rationale for Evaluation and Treatment Rehabilitation  SUBJECTIVE:   PERTINENT HISTORY: Pt is a 79 year old female with mild cognitive impairment due to Alzheimer's disease, increase agitation/frustration/crying, + reduce beta amyloid 42/40 and elevated P-tau 181, history of borderline low Vitamin D, HTN  SLUMS 06/12/2023 20/30  MRI 06/20/2023 Brain: No acute infarction, hemorrhage, hydrocephalus, extra-axial collection or mass lesion. Normal brain volume and white matter appearance. No abnormal mineralization.   PAIN:  Are you having pain? No   FALLS: Has patient fallen in last 6 months?  No  LIVING ENVIRONMENT: Lives with: lives with their spouse Lives in: House/apartment  PLOF:  Level of assistance: Independent with ADLs, Independent with IADLs Employment:  Retired   PATIENT GOALS   to improve cognitive function to maintain independence   SUBJECTIVE STATEMENT: Pt pleasant, eager, pt reading a book in the waiting room Pt accompanied by: self  OBJECTIVE:   Skilled treatment session targeted pt's cognitive communication goals. SLP facilitated session by providing the following interventions:  PATIENT REPORTED OUTCOME MEASURES (PROM):   MULTIFACTORIAL MEMORY QUESTIONNAIRE (MMQ)  Administered patient self-reported outcome measure Multifactorial Memory Questionnaire (MMQ). The Multifactorial Memory Questionnaire [facility]) consists of three scales measuring separate aspects of metamemory; Satisfaction, Ability and Strategy.   Pt's responses are converted to T-Scores with severity levels based on pt's T-Score.   Severity Levels (T-score) Very Low - < 20 Low - 20 to 29 Below Average - 30-39 Average - 40 to 60 Above Average - 60 to 70 High - 71 to 80 Very High - > 80  Pt reports:  Average - 40 to 60 Memory Satisfaction (T-score: 41) Below Average - 30-39 Memory Ability (T-score: 36) Average - 40 to 60 use of Memory Strategies (T-score: 58)     SLP further facilitated the session by providing skilled verbal and  written strategies for use of external memory aids - including creating a location for all items and using centralized notepad for written notes and finally reducing distraction when attempting to consume information that needs to be remembered. SABRA   PATIENT EDUCATION: Education details: as above Person educated: Patient Education method: Explanation Education comprehension: needs further education   HOME EXERCISE PROGRAM:   See above   GOALS:  Goals reviewed with patient? Yes  SHORT TERM GOALS: Target date: 10 sessions  With Mod I, patient will use strategies (ie., white board, daily planner/calendar, Apps on phone) to improve memory for important information with 90% accuracy.  Baseline: Goal status: INITIAL  2.   Pt.  will recall 2/4 memory strategies (w-write it, r-repeat, a-associate it, p-picture) and utilize strategies in structured and functional memory tasks to recall 80% of presented information given min to moderate cues.  Baseline:  Goal status: INITIAL  3.  With Mod I, patient will report <2 missed (medications/bills/appointments) over 2 week period with use of external memory aids/strategies.  Baseline:  Goal status: INITIAL   LONG TERM GOALS: Target date: 01/20/2025  Patient will improve basic cognitive-communication function >80% accuracy with the use of compensatory strategies in order to improve carryover from day to day, increase independence with daily living tasks, and to improve safety. Baseline:  Goal status: INITIAL   ASSESSMENT:  CLINICAL IMPRESSION: Patient is a 79 y.o. female who was seen today for a cognitive communication treatment in the setting of Alzheimer's Disease. Pt presents with mild amnestic cognitive impairment with moderate deficits observed in memory.   OBJECTIVE IMPAIRMENTS include attention, memory, awareness, and executive functioning. These impairments are limiting patient from managing medications, managing appointments, managing finances, household responsibilities, ADLs/IADLs, and effectively communicating at home and in community. Factors affecting potential to achieve goals and functional outcome are ability to learn/carryover information and medical prognosis. Patient will benefit from skilled SLP services to address above impairments and improve overall function.  REHAB POTENTIAL: Good  PLAN: SLP FREQUENCY: 1-2x/week  SLP DURATION: 12 weeks  PLANNED INTERVENTIONS: Internal/external aids, Functional tasks, SLP instruction and feedback, Compensatory strategies, and Patient/family education   Gerlean Cid B. Rubbie, M.S., CCC-SLP, CBIS Speech-Language Pathologist Certified Brain Injury Specialist [facility]  [facility] [phone] Ascom [phone] Fax [phone]  "

## 2024-11-08 ENCOUNTER — Ambulatory Visit: Admitting: Speech Pathology

## 2024-11-08 DIAGNOSIS — R41841 Cognitive communication deficit: Secondary | ICD-10-CM

## 2024-11-08 NOTE — Therapy (Unsigned)
 " OUTPATIENT SPEECH LANGUAGE PATHOLOGY  COGNITION TREATMENT NOTE  Patient Name: Kristen Strickland MRN: [id number] DOB:07/18/1946, 79 y.o., female Today's Date: 11/08/2024  PCP: Allena Hamilton, MD REFERRING PROVIDER: Jannett Fairly, MD   End of Session - 11/08/24 2049     Visit Number 3    Number of Visits 25    Date for Recertification  01/20/25    Authorization Type Medicare Part A/Part B    Progress Note Due on Visit 10    SLP Start Time 0930    SLP Stop Time  1015    SLP Time Calculation (min) 45 min    Activity Tolerance Patient tolerated treatment well          Past Medical History:  Diagnosis Date   Aortic atherosclerosis    Arthritis    Carotid arterial disease    a. 11/2017 Carotid U/S: RICA 1-39%, LICA 40-59%.    Chronic fatigue    Chronic heart failure with preserved ejection fraction (HCC)    Chronic leg pain    Complete tear of right rotator cuff    Coronary artery disease involving native coronary artery of native heart without angina pectoris    DDD (degenerative disc disease), lumbar    Dyspnea on exertion    a. 02/2017 Echo: EF 60-65%, no rwma, mild MR. Nl RV size/fxn. Nl PASP; b. 02/2017 MV: small, mild, fixed basal inferolateral defect - ? artifact vs scar. No ischemia. EF >65%.   Hypercholesterolemia    Hypertension    Insomnia    Long-term use of aspirin  therapy    Mild cognitive impairment    Palpitations    a. 02/2017 Holter: Avg HR 66 (51-115), rare PACs, four brief runs of PAT - up to 5 beats, max rate 157. Rare isolated PVCs. No sustained arrhythmias; b. 03/2018 24h Holter: Rare PACs, 3 beat run PAT (122 bpm), PVCs (2% of beats).   Radiculopathy, lumbar region    Sinus bradycardia    Skin cancer    Spondylolisthesis of lumbosacral region    Past Surgical History:  Procedure Laterality Date   ABDOMINAL EXPOSURE N/A 11/10/2018   Procedure: ABDOMINAL EXPOSURE;  Surgeon: Eliza Lonni RAMAN, MD;  Location: [facility];  Service: Vascular;  Laterality: N/A;    ABDOMINAL HYSTERECTOMY  10/21/1973   ANTERIOR CERVICAL DECOMP/DISCECTOMY FUSION N/A 04/09/2013   Procedure: ANTERIOR CERVICAL DECOMPRESSION/DISCECTOMY FUSION 2 LEVELS;  Surgeon: Catalina CHRISTELLA Stains, MD;  Location: [facility];  Service: Neurosurgery;  Laterality: N/A;  Cervical five-six Cervical six-seven  Anterior cervical decompression/diskectomy/fusion   ANTERIOR LUMBAR FUSION N/A 11/10/2018   Procedure: Anterior Lumbar Interbody Fusion-Lumbar five-Sacral one;  Surgeon: Louis Shove, MD;  Location: [facility] OR;  Service: Neurosurgery;  Laterality: N/A;   BACK SURGERY     ruptured disc   RECONSTRUCTION OF NOSE     REVERSE SHOULDER ARTHROPLASTY Right 02/05/2024   Procedure: ARTHROPLASTY, SHOULDER, TOTAL, REVERSE;  Surgeon: Edie Norleen PARAS, MD;  Location: [facility] ORS;  Service: Orthopedics;  Laterality: Right;   skin lesion removed Left 04/2024   left arm   Patient Active Problem List   Diagnosis Date Noted   Hip pain 08/07/2024   Foot deformity, left 08/06/2024   Pyelonephritis 04/24/2024   Cramps of lower extremity 07/26/2023   Hearing loss 06/19/2023   Numbness and tingling of both lower extremities 06/19/2023   Statin myopathy 03/23/2023   Memory change 03/23/2023   Senile purpura 03/18/2023   Leg pain 01/28/2023   Knee pain, left 01/28/2023  Shoulder pain 11/13/2022   Joint pain 08/18/2022   Anemia 08/08/2022   Chronic heart failure with preserved ejection fraction (HFpEF) (HCC) 01/09/2022   Coronary artery disease involving native coronary artery of native heart without angina pectoris 01/09/2022   Mitral valve insufficiency 07/26/2021   Precordial pain 07/26/2021   Calculus of gallbladder without cholecystitis without obstruction 07/26/2021   Aortic atherosclerosis 05/18/2021   Body mass index (BMI) 22.0-22.9, adult 08/31/2020   Arthrodesis status 08/10/2020   Abdominal pain 05/24/2020   Spondylolisthesis, lumbosacral region 02/02/2020   History of lumbar fusion 11/30/2019    Bilateral leg pain 08/13/2019   Sleeping difficulties 06/29/2019   Left foot pain 05/30/2019   Hyperglycemia 05/30/2019   PAC (premature atrial contraction) 01/27/2019   PVC (premature ventricular contraction) 01/27/2019   Left leg swelling 01/27/2019   Bilateral carotid artery stenosis 01/27/2019   Degenerative spondylolisthesis 11/10/2018   Decreased sense of taste 10/12/2018   Impingement syndrome, shoulder, right 04/03/2018   Swelling of right hand 11/29/2017   Fatigue 11/29/2017   Palpitations 06/11/2017   DOE (dyspnea on exertion) 09/30/2016   Carotid artery disease 09/17/2015   Back pain 08/13/2015   Health care maintenance 02/08/2015   Stress 07/03/2014   Arthritis, degenerative 05/03/2014   Right knee pain 03/27/2014   Menopausal symptoms 02/27/2014   Dizziness 01/08/2014   Right shoulder pain 04/26/2013   Neck pain 04/07/2013   Hypertension 08/23/2012   Hyperlipidemia LDL goal <70 08/23/2012    ONSET DATE: date of referral 10/08/2024 with symptoms starting  ~ 2022  REFERRING DIAG:  G31.84 (ICD-10-CM) - Mild cognitive impairment    THERAPY DIAG:  Cognitive communication deficit  Rationale for Evaluation and Treatment Rehabilitation  SUBJECTIVE:   PERTINENT HISTORY: Pt is a 79 year old female with mild cognitive impairment due to Alzheimer's disease, increase agitation/frustration/crying, + reduce beta amyloid 42/40 and elevated P-tau 181, history of borderline low Vitamin D, HTN  SLUMS 06/12/2023 20/30  MRI 06/20/2023 Brain: No acute infarction, hemorrhage, hydrocephalus, extra-axial collection or mass lesion. Normal brain volume and white matter appearance. No abnormal mineralization.   PAIN:  Are you having pain? No   FALLS: Has patient fallen in last 6 months?  No  LIVING ENVIRONMENT: Lives with: lives with their spouse Lives in: House/apartment  PLOF:  Level of assistance: Independent with ADLs, Independent with IADLs Employment:  Retired   PATIENT GOALS   to improve cognitive function to maintain independence   SUBJECTIVE STATEMENT: Pt pleasant, eager, pt reading a book in the waiting room Pt accompanied by: self  OBJECTIVE:   Skilled treatment session targeted pt's cognitive communication goals. SLP facilitated session by providing the following interventions:  Pt tends to perseverate on remembering what is improtant and not remembering what isn't important. As such she is not developing habitual use of external memory aids. Extensive education provided on establishing system of compensatory memory aids to promote accurate recall of information in the present as well as potential memory decline. Recommend pt increase use of paper pencil access. While pt voices understanding, she also voices some hesitancy. However she reached for pen to write down information related to new appt time at end of session.    PATIENT EDUCATION: Education details: as above Person educated: Patient Education method: Explanation Education comprehension: needs further education   HOME EXERCISE PROGRAM:   See above   GOALS:  Goals reviewed with patient? Yes  SHORT TERM GOALS: Target date: 10 sessions  With Mod I, patient will use  strategies (ie., white board, daily planner/calendar, Apps on phone) to improve memory for important information with 90% accuracy.  Baseline: Goal status: INITIAL  2.   Pt. will recall 2/4 memory strategies (w-write it, r-repeat, a-associate it, p-picture) and utilize strategies in structured and functional memory tasks to recall 80% of presented information given min to moderate cues.  Baseline:  Goal status: INITIAL  3.  With Mod I, patient will report <2 missed (medications/bills/appointments) over 2 week period with use of external memory aids/strategies.  Baseline:  Goal status: INITIAL   LONG TERM GOALS: Target date: 01/20/2025  Patient will improve basic cognitive-communication  function >80% accuracy with the use of compensatory strategies in order to improve carryover from day to day, increase independence with daily living tasks, and to improve safety. Baseline:  Goal status: INITIAL   ASSESSMENT:  CLINICAL IMPRESSION: Patient is a 79 y.o. female who was seen today for a cognitive communication treatment in the setting of Alzheimer's Disease. Pt presents with mild amnestic cognitive impairment with moderate deficits observed in memory.   OBJECTIVE IMPAIRMENTS include attention, memory, awareness, and executive functioning. These impairments are limiting patient from managing medications, managing appointments, managing finances, household responsibilities, ADLs/IADLs, and effectively communicating at home and in community. Factors affecting potential to achieve goals and functional outcome are ability to learn/carryover information and medical prognosis. Patient will benefit from skilled SLP services to address above impairments and improve overall function.  REHAB POTENTIAL: Good  PLAN: SLP FREQUENCY: 1-2x/week  SLP DURATION: 12 weeks  PLANNED INTERVENTIONS: Internal/external aids, Functional tasks, SLP instruction and feedback, Compensatory strategies, and Patient/family education   Kristen Strickland, M.S., CCC-SLP, CBIS Speech-Language Pathologist Certified Brain Injury Specialist Kristen Strickland  Kristen Strickland [phone] Ascom [phone] Fax [phone]  "

## 2024-11-11 ENCOUNTER — Ambulatory Visit: Admitting: Speech Pathology

## 2024-11-15 ENCOUNTER — Ambulatory Visit

## 2024-11-15 ENCOUNTER — Ambulatory Visit
Admission: EM | Admit: 2024-11-15 | Discharge: 2024-11-15 | Disposition: A | Discharge status: Home / Self Care | Attending: Physician Assistant | Admitting: Physician Assistant

## 2024-11-15 DIAGNOSIS — G8929 Other chronic pain: Secondary | ICD-10-CM

## 2024-11-15 DIAGNOSIS — K5900 Constipation, unspecified: Secondary | ICD-10-CM

## 2024-11-15 DIAGNOSIS — R1030 Lower abdominal pain, unspecified: Secondary | ICD-10-CM

## 2024-11-15 DIAGNOSIS — M545 Low back pain, unspecified: Secondary | ICD-10-CM | POA: Diagnosis not present

## 2024-11-15 LAB — POCT URINE DIPSTICK
Blood, UA: NEGATIVE
Glucose, UA: NEGATIVE mg/dL
Ketones, POC UA: NEGATIVE mg/dL
Leukocytes, UA: NEGATIVE
Nitrite, UA: NEGATIVE
Spec Grav, UA: 1.015
Urobilinogen, UA: 0.2 U/dL
pH, UA: 5.5

## 2024-11-15 MED ORDER — SENNOSIDES-DOCUSATE SODIUM 8.6-50 MG PO TABS: 1.0000 | ORAL_TABLET | Freq: Every day | ORAL | 0 refills | Status: AC | PRN

## 2024-11-15 NOTE — ED Provider Notes (Signed)
 " [facility]    CSN: [id number] Arrival date & time: 11/15/24  1004      History   Chief Complaint Chief Complaint  Patient presents with   Abdominal Pain    HPI CHAUNTAE HULTS is a 79 y.o. female with history of CHF, CAD, hypertension, hyperlipidemia, insomnia, mild cognitive impairment, aortic atherosclerosis, carotid artery stenosis, and palpitations presents for central lower back pain and diffuse mild abdominal cramping. Reports reduced appetite. No fever, chills, sweats, diarrhea, dark or bloody stool. Denies dysuria, frequency, hematuria, vaginal discharge. Reports constipation over the past few weeks which she attributes to memantine. She began this medication about 1 month ago. She took Miralax  last night which did produce a BM and she reports feeling a little better.  Does have a history of chronic lower back pain which has been known to radiate to abdomen and cause abdominal discomfort.  Takes Celebrex  and Tylenol .  Patient surgical history significant for abdominal hysterectomy, back surgery, shoulder surgery.  HPI  Past Medical History:  Diagnosis Date   Aortic atherosclerosis    Arthritis    Carotid arterial disease    a. 11/2017 Carotid U/S: RICA 1-39%, LICA 40-59%.    Chronic fatigue    Chronic heart failure with preserved ejection fraction (HCC)    Chronic leg pain    Complete tear of right rotator cuff    Coronary artery disease involving native coronary artery of native heart without angina pectoris    DDD (degenerative disc disease), lumbar    Dyspnea on exertion    a. 02/2017 Echo: EF 60-65%, no rwma, mild MR. Nl RV size/fxn. Nl PASP; b. 02/2017 MV: small, mild, fixed basal inferolateral defect - ? artifact vs scar. No ischemia. EF >65%.   Hypercholesterolemia    Hypertension    Insomnia    Long-term use of aspirin  therapy    Mild cognitive impairment    Palpitations    a. 02/2017 Holter: Avg HR 66 (51-115), rare PACs, four brief runs of PAT -  up to 5 beats, max rate 157. Rare isolated PVCs. No sustained arrhythmias; b. 03/2018 24h Holter: Rare PACs, 3 beat run PAT (122 bpm), PVCs (2% of beats).   Radiculopathy, lumbar region    Sinus bradycardia    Skin cancer    Spondylolisthesis of lumbosacral region     Patient Active Problem List   Diagnosis Date Noted   Hip pain 08/07/2024   Foot deformity, left 08/06/2024   Pyelonephritis 04/24/2024   Cramps of lower extremity 07/26/2023   Hearing loss 06/19/2023   Numbness and tingling of both lower extremities 06/19/2023   Statin myopathy 03/23/2023   Memory change 03/23/2023   Senile purpura 03/18/2023   Leg pain 01/28/2023   Knee pain, left 01/28/2023   Shoulder pain 11/13/2022   Joint pain 08/18/2022   Anemia 08/08/2022   Chronic heart failure with preserved ejection fraction (HFpEF) (HCC) 01/09/2022   Coronary artery disease involving native coronary artery of native heart without angina pectoris 01/09/2022   Mitral valve insufficiency 07/26/2021   Precordial pain 07/26/2021   Calculus of gallbladder without cholecystitis without obstruction 07/26/2021   Aortic atherosclerosis 05/18/2021   Body mass index (BMI) 22.0-22.9, adult 08/31/2020   Arthrodesis status 08/10/2020   Abdominal pain 05/24/2020   Spondylolisthesis, lumbosacral region 02/02/2020   History of lumbar fusion 11/30/2019   Bilateral leg pain 08/13/2019   Sleeping difficulties 06/29/2019   Left foot pain 05/30/2019   Hyperglycemia 05/30/2019   PAC (  premature atrial contraction) 01/27/2019   PVC (premature ventricular contraction) 01/27/2019   Left leg swelling 01/27/2019   Bilateral carotid artery stenosis 01/27/2019   Degenerative spondylolisthesis 11/10/2018   Decreased sense of taste 10/12/2018   Impingement syndrome, shoulder, right 04/03/2018   Swelling of right hand 11/29/2017   Fatigue 11/29/2017   Palpitations 06/11/2017   DOE (dyspnea on exertion) 09/30/2016   Carotid artery disease  09/17/2015   Back pain 08/13/2015   Health care maintenance 02/08/2015   Stress 07/03/2014   Arthritis, degenerative 05/03/2014   Right knee pain 03/27/2014   Menopausal symptoms 02/27/2014   Dizziness 01/08/2014   Right shoulder pain 04/26/2013   Neck pain 04/07/2013   Hypertension 08/23/2012   Hyperlipidemia LDL goal <70 08/23/2012    Past Surgical History:  Procedure Laterality Date   ABDOMINAL EXPOSURE N/A 11/10/2018   Procedure: ABDOMINAL EXPOSURE;  Surgeon: Eliza Lonni RAMAN, MD;  Location: [facility] OR;  Service: Vascular;  Laterality: N/A;   ABDOMINAL HYSTERECTOMY  10/21/1973   ANTERIOR CERVICAL DECOMP/DISCECTOMY FUSION N/A 04/09/2013   Procedure: ANTERIOR CERVICAL DECOMPRESSION/DISCECTOMY FUSION 2 LEVELS;  Surgeon: Catalina CHRISTELLA Stains, MD;  Location: [facility];  Service: Neurosurgery;  Laterality: N/A;  Cervical five-six Cervical six-seven  Anterior cervical decompression/diskectomy/fusion   ANTERIOR LUMBAR FUSION N/A 11/10/2018   Procedure: Anterior Lumbar Interbody Fusion-Lumbar five-Sacral one;  Surgeon: Louis Shove, MD;  Location: [facility] OR;  Service: Neurosurgery;  Laterality: N/A;   BACK SURGERY     ruptured disc   RECONSTRUCTION OF NOSE     REVERSE SHOULDER ARTHROPLASTY Right 02/05/2024   Procedure: ARTHROPLASTY, SHOULDER, TOTAL, REVERSE;  Surgeon: Edie Norleen PARAS, MD;  Location: [facility];  Service: Orthopedics;  Laterality: Right;   skin lesion removed Left 04/2024   left arm    OB History   No obstetric history on file.      Home Medications    Prior to Admission medications  Medication Sig Start Date End Date Taking? Authorizing Provider  celecoxib  (CELEBREX ) 100 MG capsule Take 100 mg by mouth. 10/28/24  Yes [provider]  senna-docusate (SENOKOT-S) 8.6-50 MG tablet Take 1 tablet by mouth daily as needed for mild constipation or moderate constipation. 11/15/24  Yes Arvis Huxley B, PA-C  acetaminophen  (TYLENOL ) 650 MG CR tablet Take 650 mg by mouth in  the morning.    [provider]  albuterol  (VENTOLIN  HFA) 108 (90 Base) MCG/ACT inhaler Inhale 1-2 puffs into the lungs every 6 (six) hours as needed for wheezing or shortness of breath. 10/21/24   Mayer, Jodi R, NP  amLODipine  (NORVASC ) 2.5 MG tablet TAKE ONE TABLET BY MOUTH ONCE DAILY 08/26/24   Glendia Shad, MD  aspirin  EC 81 MG tablet Take 81 mg by mouth daily with lunch.    [provider]  ezetimibe  (ZETIA ) 10 MG tablet TAKE ONE TABLET BY MOUTH ONCE DAILY 08/26/24   Glendia Shad, MD  gabapentin  (NEURONTIN ) 100 MG capsule Take 1 capsule (100 mg total) by mouth daily. 11/01/24   Glendia Shad, MD  losartan -hydrochlorothiazide  (HYZAAR) 100-25 MG tablet TAKE ONE TABLET BY MOUTH ONCE DAILY 06/15/24   Glendia Shad, MD  memantine (NAMENDA) 5 MG tablet Take 5 mg by mouth daily.    [provider]  Multiple Vitamins-Minerals (CENTRUM ADULTS PO) Take 1 capsule by mouth in the morning.    [provider]  pantoprazole  (PROTONIX ) 40 MG tablet TAKE ONE TABLET BY MOUTH EVERY DAY 06/15/24   Glendia Shad, MD  pravastatin  (PRAVACHOL ) 10 MG  tablet Take 1 tablet (10 mg total) by mouth every Monday, Wednesday, and Friday. 05/14/24   Glendia Shad, MD  SODIUM FLUORIDE 5000 PPM 1.1 % PSTE daily. 01/28/24   [provider]    Family History Family History  Problem Relation Age of Onset   Cancer Mother        ovarian/uterine   Heart disease Mother    Heart attack Mother    Stroke Mother    Colon cancer Father    Dementia Father    COPD Brother    Congestive Heart Failure Brother    Stroke Daughter    Heart attack Daughter    Suicidality Brother    Breast cancer Neg Hx     Social History Social History[1]   Allergies   Patient has no known allergies.   Review of Systems Review of Systems  Constitutional:  Negative for chills, fatigue and fever.  HENT:  Negative for congestion.   Respiratory:  Negative for cough.   Cardiovascular:  Negative  for chest pain.  Gastrointestinal:  Positive for abdominal pain and constipation. Negative for anal bleeding, blood in stool, diarrhea, nausea and vomiting.  Genitourinary:  Negative for decreased urine volume, dysuria, flank pain, frequency, hematuria, pelvic pain, urgency, vaginal bleeding, vaginal discharge and vaginal pain.  Musculoskeletal:  Positive for back pain.  Skin:  Negative for rash.  Neurological:  Negative for weakness and numbness.     Physical Exam Triage Vital Signs ED Triage Vitals  Encounter Vitals Group     BP      Girls Systolic BP Percentile      Girls Diastolic BP Percentile      Boys Systolic BP Percentile      Boys Diastolic BP Percentile      Pulse      Resp      Temp      Temp src      SpO2      Weight      Height      Head Circumference      Peak Flow      Pain Score      Pain Loc      Pain Education      Exclude from Growth Chart    No data found.  Updated Vital Signs BP (!) 120/58   Pulse 62   Temp 97.6 F (36.4 C) (Oral)   Resp 18   SpO2 95%       Physical Exam Vitals and nursing note reviewed.  Constitutional:      General: She is not in acute distress.    Appearance: Normal appearance. She is not ill-appearing or toxic-appearing.  HENT:     Head: Normocephalic and atraumatic.  Eyes:     General: No scleral icterus.       Right eye: No discharge.        Left eye: No discharge.     Conjunctiva/sclera: Conjunctivae normal.  Cardiovascular:     Rate and Rhythm: Normal rate and regular rhythm.     Heart sounds: Normal heart sounds.  Pulmonary:     Effort: Pulmonary effort is normal. No respiratory distress.     Breath sounds: Normal breath sounds.  Abdominal:     Palpations: Abdomen is soft.     Tenderness: There is abdominal tenderness (generalized). There is no right CVA tenderness, left CVA tenderness or guarding.  Musculoskeletal:     Cervical back: Neck supple.     Lumbar back:  Bony tenderness (L5-S1) present.  Decreased range of motion.  Skin:    General: Skin is dry.  Neurological:     General: No focal deficit present.     Mental Status: She is alert. Mental status is at baseline.     Motor: No weakness.     Gait: Gait normal.  Psychiatric:        Mood and Affect: Mood normal.        Behavior: Behavior normal.      UC Treatments / Results  Labs (all labs ordered are listed, but only abnormal results are displayed) Labs Reviewed  POCT URINE DIPSTICK - Abnormal; Notable for the following components:      Result Value   Bilirubin, UA small (*)    Protein Ur, POC trace (*)    All other components within normal limits    EKG   Radiology DG Abdomen 1 View Result Date: 11/15/2024 EXAM: 1 VIEW XRAY OF THE ABDOMEN 11/15/2024 11:05:43 AM COMPARISON: 05/24/2020. CLINICAL HISTORY: Lower abdominal pain and constipation for a while. FINDINGS: BOWEL: Nonobstructive bowel gas pattern. Mild colonic stool burden predominantly within the left hemicolon. SOFT TISSUES: Pelvic phleboliths. BONES: L5-S1 disc fusion. Degenerative changes of the lumbar spine. Levoconvex lumbar scoliosis. Mild-to-moderate bilateral hip osteoarthritis. IMPRESSION: 1. Mild colonic stool burden predominantly within the left hemicolon. Electronically signed by: Waddell Calk MD 11/15/2024 11:21 AM EST RP Workstation: [id number]    Procedures Procedures (including critical care time)  Medications Ordered in UC Medications - No data to display  Initial Impression / Assessment and Plan / UC Course  I have reviewed the triage vital signs and the nursing notes.  Pertinent labs & imaging results that were available during my care of the patient were reviewed by me and considered in my medical decision making (see chart for details).   79 year old female presents for lower abdominal pain and lower back pain for the past week.  Also reports constipation which she thinks is related to her Namenda.  Recently started this medication  in the past month.  She took MiraLAX  yesterday, had a BM and states this helped symptoms a little bit.  No associated fevers, vomiting, diarrhea, black or bloody stool.  Vitals are stable.  She is afebrile.  Overall well-appearing.  No acute distress.  On exam she has mild generalized abdominal tenderness.  No CVA tenderness.  Tenderness of the L5-S1 midline and reduced range of motion of back.  Has discomfort with flexion and extension of back.  Urinalysis not consistent with UTI.  KUB obtained at patient request.  KUB shows mild to moderate constipation which I discussed with patient.  Advised her to continue MiraLAX .  I sent senna to pharmacy.  Encouraged increasing rest and fluids.  Suggest following up with primary care provider or prescriber regarding the Namenda.  Discussed that some of her symptoms like the back pain is likely chronic and to continue Celebrex  and Tylenol .  Advised going to emergency department if abdominal pain or back pain worsens or she develops a fever.   Final Clinical Impressions(s) / UC Diagnoses   Final diagnoses:  Lower abdominal pain  Constipation, unspecified constipation type  Chronic midline low back pain, unspecified whether sciatica present     Discharge Instructions      - Your urine test is not consistent with UTI. - The x-ray we performed today does show constipation so this could be a partial cause of your symptoms.  Potential medication side effect from the Namenda.  Speak to your prescriber. - Some of your symptoms could also be related to chronic back pain.  Continue Tylenol  and Celebrex . - I sent senna to the pharmacy to help with constipation.  You can also take MiraLAX .  Increase fluids and rest. - Make a PCP follow-up appointment if symptoms or not improving. - Go to the ER if any acute worsening of your pain or fever.     ED Prescriptions     Medication Sig Dispense Auth. Provider   senna-docusate (SENOKOT-S) 8.6-50 MG tablet Take 1  tablet by mouth daily as needed for mild constipation or moderate constipation. 20 tablet Josselyne Onofrio B, PA-C      PDMP not reviewed this encounter.     [1]  Social History Tobacco Use   Smoking status: Never   Smokeless tobacco: Never  Vaping Use   Vaping status: Never Used  Substance Use Topics   Alcohol use: Not Currently    Alcohol/week: 0.0 standard drinks of alcohol    Comment: rarely   Drug use: No     Arvis Jolan NOVAK, PA-C 11/15/24 1145  "

## 2024-11-15 NOTE — Discharge Instructions (Addendum)
-   Your urine test is not consistent with UTI. - The x-ray we performed today does show constipation so this could be a partial cause of your symptoms.  Potential medication side effect from the Namenda.  Speak to your prescriber. - Some of your symptoms could also be related to chronic back pain.  Continue Tylenol  and Celebrex . - I sent senna to the pharmacy to help with constipation.  You can also take MiraLAX .  Increase fluids and rest. - Make a PCP follow-up appointment if symptoms or not improving. - Go to the ER if any acute worsening of your pain or fever.

## 2024-11-15 NOTE — ED Triage Notes (Signed)
 Patient presents to [facility] for lower abdominal pain and back pain x 1 week. States she was seen at [facility] and they performed a UA. No abnormal findings. Not treating with any OTC meds. She started memantine and celebrex . She is unsure if these meds have caused her symptoms.

## 2024-11-16 ENCOUNTER — Ambulatory Visit: Payer: Self-pay

## 2024-11-16 ENCOUNTER — Encounter: Payer: Self-pay | Admitting: Internal Medicine

## 2024-11-16 NOTE — Telephone Encounter (Signed)
 Reviewed note. I do not see a recent urine culture. It appears that neurology sent in rx for macrobid. Is she taking this medication. If she is having abdominal pain and symptoms, needs to be evaluated.

## 2024-11-17 ENCOUNTER — Ambulatory Visit: Admitting: Speech Pathology

## 2024-11-19 ENCOUNTER — Ambulatory Visit: Admitting: Speech Pathology

## 2024-11-19 DIAGNOSIS — R41841 Cognitive communication deficit: Secondary | ICD-10-CM

## 2024-11-19 NOTE — Therapy (Signed)
 " OUTPATIENT SPEECH LANGUAGE PATHOLOGY  COGNITION TREATMENT NOTE DISCHARGE SUMMARY   Patient Name: Kristen Strickland MRN: [id number] DOB:01-30-1946, 79 y.o., female Today's Date: 11/19/2024  PCP: Allena Hamilton, MD REFERRING PROVIDER: Jannett Fairly, MD   End of Session - 11/19/24 1022     Visit Number 4    Number of Visits 25    Date for Recertification  01/20/25    Authorization Type Medicare Part A/Part B    Progress Note Due on Visit 10    SLP Start Time 1015    SLP Stop Time  1100    SLP Time Calculation (min) 45 min    Activity Tolerance Patient tolerated treatment well          Past Medical History:  Diagnosis Date   Aortic atherosclerosis    Arthritis    Carotid arterial disease    a. 11/2017 Carotid U/S: RICA 1-39%, LICA 40-59%.    Chronic fatigue    Chronic heart failure with preserved ejection fraction (HCC)    Chronic leg pain    Complete tear of right rotator cuff    Coronary artery disease involving native coronary artery of native heart without angina pectoris    DDD (degenerative disc disease), lumbar    Dyspnea on exertion    a. 02/2017 Echo: EF 60-65%, no rwma, mild MR. Nl RV size/fxn. Nl PASP; b. 02/2017 MV: small, mild, fixed basal inferolateral defect - ? artifact vs scar. No ischemia. EF >65%.   Hypercholesterolemia    Hypertension    Insomnia    Long-term use of aspirin  therapy    Mild cognitive impairment    Palpitations    a. 02/2017 Holter: Avg HR 66 (51-115), rare PACs, four brief runs of PAT - up to 5 beats, max rate 157. Rare isolated PVCs. No sustained arrhythmias; b. 03/2018 24h Holter: Rare PACs, 3 beat run PAT (122 bpm), PVCs (2% of beats).   Radiculopathy, lumbar region    Sinus bradycardia    Skin cancer    Spondylolisthesis of lumbosacral region    Past Surgical History:  Procedure Laterality Date   ABDOMINAL EXPOSURE N/A 11/10/2018   Procedure: ABDOMINAL EXPOSURE;  Surgeon: Eliza Lonni RAMAN, MD;  Location: [facility] OR;  Service:  Vascular;  Laterality: N/A;   ABDOMINAL HYSTERECTOMY  10/21/1973   ANTERIOR CERVICAL DECOMP/DISCECTOMY FUSION N/A 04/09/2013   Procedure: ANTERIOR CERVICAL DECOMPRESSION/DISCECTOMY FUSION 2 LEVELS;  Surgeon: Catalina CHRISTELLA Stains, MD;  Location: [facility];  Service: Neurosurgery;  Laterality: N/A;  Cervical five-six Cervical six-seven  Anterior cervical decompression/diskectomy/fusion   ANTERIOR LUMBAR FUSION N/A 11/10/2018   Procedure: Anterior Lumbar Interbody Fusion-Lumbar five-Sacral one;  Surgeon: Louis Shove, MD;  Location: [facility] OR;  Service: Neurosurgery;  Laterality: N/A;   BACK SURGERY     ruptured disc   RECONSTRUCTION OF NOSE     REVERSE SHOULDER ARTHROPLASTY Right 02/05/2024   Procedure: ARTHROPLASTY, SHOULDER, TOTAL, REVERSE;  Surgeon: Edie Norleen PARAS, MD;  Location: [facility] ORS;  Service: Orthopedics;  Laterality: Right;   skin lesion removed Left 04/2024   left arm   Patient Active Problem List   Diagnosis Date Noted   Hip pain 08/07/2024   Foot deformity, left 08/06/2024   Pyelonephritis 04/24/2024   Cramps of lower extremity 07/26/2023   Hearing loss 06/19/2023   Numbness and tingling of both lower extremities 06/19/2023   Statin myopathy 03/23/2023   Memory change 03/23/2023   Senile purpura 03/18/2023   Leg pain 01/28/2023   Knee pain,  left 01/28/2023   Shoulder pain 11/13/2022   Joint pain 08/18/2022   Anemia 08/08/2022   Chronic heart failure with preserved ejection fraction (HFpEF) (HCC) 01/09/2022   Coronary artery disease involving native coronary artery of native heart without angina pectoris 01/09/2022   Mitral valve insufficiency 07/26/2021   Precordial pain 07/26/2021   Calculus of gallbladder without cholecystitis without obstruction 07/26/2021   Aortic atherosclerosis 05/18/2021   Body mass index (BMI) 22.0-22.9, adult 08/31/2020   Arthrodesis status 08/10/2020   Abdominal pain 05/24/2020   Spondylolisthesis, lumbosacral region 02/02/2020   History of  lumbar fusion 11/30/2019   Bilateral leg pain 08/13/2019   Sleeping difficulties 06/29/2019   Left foot pain 05/30/2019   Hyperglycemia 05/30/2019   PAC (premature atrial contraction) 01/27/2019   PVC (premature ventricular contraction) 01/27/2019   Left leg swelling 01/27/2019   Bilateral carotid artery stenosis 01/27/2019   Degenerative spondylolisthesis 11/10/2018   Decreased sense of taste 10/12/2018   Impingement syndrome, shoulder, right 04/03/2018   Swelling of right hand 11/29/2017   Fatigue 11/29/2017   Palpitations 06/11/2017   DOE (dyspnea on exertion) 09/30/2016   Carotid artery disease 09/17/2015   Back pain 08/13/2015   Health care maintenance 02/08/2015   Stress 07/03/2014   Arthritis, degenerative 05/03/2014   Right knee pain 03/27/2014   Menopausal symptoms 02/27/2014   Dizziness 01/08/2014   Right shoulder pain 04/26/2013   Neck pain 04/07/2013   Hypertension 08/23/2012   Hyperlipidemia LDL goal <70 08/23/2012    ONSET DATE: date of referral 10/08/2024 with symptoms starting  ~ 2022  REFERRING DIAG:  G31.84 (ICD-10-CM) - Mild cognitive impairment    THERAPY DIAG:  Cognitive communication deficit  Rationale for Evaluation and Treatment Rehabilitation  SUBJECTIVE:   PERTINENT HISTORY: Pt is a 79 year old female with mild cognitive impairment due to Alzheimer's disease, increase agitation/frustration/crying, + reduce beta amyloid 42/40 and elevated P-tau 181, history of borderline low Vitamin D, HTN  SLUMS 06/12/2023 20/30  MRI 06/20/2023 Brain: No acute infarction, hemorrhage, hydrocephalus, extra-axial collection or mass lesion. Normal brain volume and white matter appearance. No abnormal mineralization.   PAIN:  Are you having pain? No   FALLS: Has patient fallen in last 6 months?  No  LIVING ENVIRONMENT: Lives with: lives with their spouse Lives in: House/apartment  PLOF:  Level of assistance: Independent with ADLs, Independent with  IADLs Employment: Retired   PATIENT GOALS   to improve cognitive function to maintain independence   SUBJECTIVE STATEMENT: Pt pleasant, eager, Pt missed session d/t UTI - urgent care visit Pt accompanied by: self  OBJECTIVE:   Skilled treatment session targeted pt's cognitive communication goals. SLP facilitated session by providing the following interventions:  Pt arrives to session after not feeling well and multiple medical visits. She had great verbal recall of all information as confirmed in her chart. Recommend that she schedule follow up appt with her PCP to discuss I hate I can't tolerate that memory medicine.   Pt reports difficulty creating habits for use of external memory aids d/t sickness and increased medical appts recently. 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    Pt is independent with all education that has been provided.   Education completed on potentially learning new skills on her phone and as well as having her daughter attend her medical appointments with her.  PATIENT EDUCATION: Education details: as above Person educated: Patient Education method: Explanation Education comprehension: needs further education   HOME EXERCISE PROGRAM:   See above   GOALS:  Goals reviewed with patient? Yes  SHORT TERM GOALS: Target date: 10 sessions  Updated 11/19/2024 With Mod I, patient will use strategies (ie., white board, daily planner/calendar, Apps on phone) to improve memory for important information with 90% accuracy.  Baseline: Goal status: INITIAL: MET pt has knowledge of using these strategies but has had several medical barriers to implementation  2.   Pt. will recall 2/4 memory strategies (w-write it, r-repeat, a-associate it, p-picture) and utilize strategies in structured and functional memory tasks  to recall 80% of presented information given min to moderate cues.  Baseline:  Goal status: INITIAL: MET  3.  With Mod I, patient will report <2 missed (medications/bills/appointments) over 2 week period with use of external memory aids/strategies.  Baseline:  Goal status: INITIAL: MET   LONG TERM GOALS: Target date: 01/20/2025  Updated: 11/19/2024 Patient will improve basic cognitive-communication function >80% accuracy with the use of compensatory strategies in order to improve carryover from day to day, increase independence with daily living tasks, and to improve safety. Baseline:  Goal status: INITIAL: MET   ASSESSMENT:  CLINICAL IMPRESSION: Patient is a 79 y.o. female who was seen today for a cognitive communication treatment in the setting of Alzheimer's Disease. Pt presents with mild amnestic cognitive impairment with moderate deficits observed in memory. She has made great progress, demonstrates understanding of cognitive promoting activities, use of external memory aids. As such she has met maximal benefit from skilled ST services. She is aware that if she should experience further decline, she can request re-evaluation.   OBJECTIVE IMPAIRMENTS include attention, memory, awareness, and executive functioning. These impairments are limiting patient from managing medications, managing appointments, managing finances, household responsibilities, ADLs/IADLs, and effectively communicating at home and in community. Factors affecting potential to achieve goals and functional outcome are ability to learn/carryover information and medical prognosis. Patient will benefit from skilled SLP services to address above impairments and improve overall function.  REHAB POTENTIAL: Good    Marilyne Haseley B. Rubbie, M.S., CCC-SLP, CBIS Speech-Language Pathologist Certified Brain Injury Specialist [facility]  [facility] [phone] Ascom  [phone] Fax [phone]  "

## 2024-11-22 ENCOUNTER — Ambulatory Visit: Admitting: Speech Pathology

## 2024-11-23 ENCOUNTER — Telehealth: Payer: Self-pay

## 2024-11-24 ENCOUNTER — Ambulatory Visit: Admitting: Speech Pathology

## 2024-11-24 NOTE — Telephone Encounter (Signed)
 Called pt she said yes to the date and time. But your last patient on 11/26/2024 is at 11:30

## 2024-11-24 NOTE — Telephone Encounter (Signed)
 Ok to add 12:00.

## 2024-11-24 NOTE — Telephone Encounter (Signed)
 See if there is anyway she can come in today at 3:00.  Thanks.

## 2024-11-24 NOTE — Telephone Encounter (Signed)
 See if she can come in at 12:00 on Friday 11/26/24 - for appt with me.  Thanks.

## 2024-11-24 NOTE — Telephone Encounter (Signed)
 Called pt she is unable to come in today at 3 but she stated she would like to come in tomorrow for a lab appointment to drop off a urine sample. I stated to pt I would have to consult with you before I schedule a lab appointment.

## 2024-11-25 NOTE — Telephone Encounter (Signed)
 Noted.

## 2024-11-26 ENCOUNTER — Encounter: Payer: Self-pay | Admitting: Internal Medicine

## 2024-11-26 ENCOUNTER — Ambulatory Visit: Admitting: Internal Medicine

## 2024-11-26 VITALS — BP 136/80 | HR 56 | Temp 97.8°F | Ht 64.0 in | Wt 148.6 lb

## 2024-11-26 DIAGNOSIS — E785 Hyperlipidemia, unspecified: Secondary | ICD-10-CM

## 2024-11-26 DIAGNOSIS — R8281 Pyuria: Secondary | ICD-10-CM | POA: Insufficient documentation

## 2024-11-26 DIAGNOSIS — R300 Dysuria: Secondary | ICD-10-CM

## 2024-11-26 DIAGNOSIS — R1013 Epigastric pain: Secondary | ICD-10-CM

## 2024-11-26 LAB — HEPATIC FUNCTION PANEL
ALT: 37 U/L — ABNORMAL HIGH (ref 3–35)
AST: 26 U/L (ref 5–37)
Albumin: 4.1 g/dL (ref 3.5–5.2)
Alkaline Phosphatase: 106 U/L (ref 39–117)
Bilirubin, Direct: 0.1 mg/dL (ref 0.1–0.3)
Total Bilirubin: 0.6 mg/dL (ref 0.2–1.2)
Total Protein: 6.8 g/dL (ref 6.0–8.3)

## 2024-11-26 LAB — CBC WITH DIFFERENTIAL/PLATELET
Basophils Absolute: 0.1 10*3/uL (ref 0.0–0.1)
Basophils Relative: 1 % (ref 0.0–3.0)
Eosinophils Absolute: 0.2 10*3/uL (ref 0.0–0.7)
Eosinophils Relative: 1.9 % (ref 0.0–5.0)
HCT: 35.4 % — ABNORMAL LOW (ref 36.0–46.0)
Hemoglobin: 11.8 g/dL — ABNORMAL LOW (ref 12.0–15.0)
Lymphocytes Relative: 17.2 % (ref 12.0–46.0)
Lymphs Abs: 1.4 10*3/uL (ref 0.7–4.0)
MCHC: 33.4 g/dL (ref 30.0–36.0)
MCV: 86.6 fl (ref 78.0–100.0)
Monocytes Absolute: 0.6 10*3/uL (ref 0.1–1.0)
Monocytes Relative: 6.9 % (ref 3.0–12.0)
Neutro Abs: 6 10*3/uL (ref 1.4–7.7)
Neutrophils Relative %: 73 % (ref 43.0–77.0)
Platelets: 314 10*3/uL (ref 150.0–400.0)
RBC: 4.08 Mil/uL (ref 3.87–5.11)
RDW: 14 % (ref 11.5–15.5)
WBC: 8.3 10*3/uL (ref 4.0–10.5)

## 2024-11-26 LAB — BASIC METABOLIC PANEL WITH GFR
BUN: 15 mg/dL (ref 6–23)
CO2: 29 meq/L (ref 19–32)
Calcium: 9.5 mg/dL (ref 8.4–10.5)
Chloride: 101 meq/L (ref 96–112)
Creatinine, Ser: 0.72 mg/dL (ref 0.40–1.20)
GFR: 80.55 mL/min
Glucose, Bld: 86 mg/dL (ref 70–99)
Potassium: 4.2 meq/L (ref 3.5–5.1)
Sodium: 139 meq/L (ref 135–145)

## 2024-11-26 LAB — POCT URINE DIPSTICK
Bilirubin, UA: NEGATIVE
Blood, UA: NEGATIVE
Glucose, UA: NEGATIVE mg/dL
Ketones, POC UA: NEGATIVE mg/dL
Nitrite, UA: NEGATIVE
POC PROTEIN,UA: NEGATIVE
Spec Grav, UA: <=1.005 — AB
Urobilinogen, UA: 0.2 U/dL
pH, UA: 5.5

## 2024-11-26 LAB — URINALYSIS, ROUTINE W REFLEX MICROSCOPIC
Bilirubin Urine: NEGATIVE
Hgb urine dipstick: NEGATIVE
Ketones, ur: NEGATIVE
Nitrite: NEGATIVE
Specific Gravity, Urine: <=1.005 — AB (ref 1.000–1.030)
Total Protein, Urine: NEGATIVE
Urine Glucose: NEGATIVE
Urobilinogen, UA: 0.2 (ref 0.0–1.0)
pH: 6.5 (ref 5.0–8.0)

## 2024-11-26 MED ORDER — PANTOPRAZOLE SODIUM 40 MG PO TBEC: 40.0000 mg | DELAYED_RELEASE_TABLET | Freq: Every day | ORAL | 0 refills | Status: AC

## 2024-11-26 MED ORDER — CEFDINIR 300 MG PO CAPS: 300.0000 mg | ORAL_CAPSULE | Freq: Two times a day (BID) | ORAL | 0 refills | Status: AC

## 2024-11-26 MED ORDER — PRAVASTATIN SODIUM 10 MG PO TABS: 10.0000 mg | ORAL_TABLET | ORAL | 0 refills | Status: AC

## 2024-11-26 MED ORDER — AMLODIPINE BESYLATE 2.5 MG PO TABS: 2.5000 mg | ORAL_TABLET | Freq: Every day | ORAL | 1 refills | Status: AC

## 2024-11-26 MED ORDER — LOSARTAN POTASSIUM-HCTZ 100-25 MG PO TABS: 1.0000 | ORAL_TABLET | Freq: Every day | ORAL | 0 refills | Status: AC

## 2024-11-26 NOTE — Patient Instructions (Signed)
 Start the antibiotic (omnicef ) - one capsule twice a day.   Take a probiotic while you are on the antibiotic and for two weeks after completing the antibiotic.   Saline nasal spray - flush nose 1-2x/day  Nasacort  nasal spray - 2 sprays each nostril one time per day.

## 2024-11-26 NOTE — Progress Notes (Unsigned)
 "  Subjective:    Patient ID: Kristen Strickland, female    DOB: Nov 15, 1945, 79 y.o.   MRN: [id number]  Patient here for No chief complaint on file.   HPI Here for a work in appt. She was seen 11/15/24 - UC - low back pain and diffuse mild abdominal cramping. Some constipation. KUB - mild to moderated constipation. Saw ortho 10/27/24 - f/u left him and thigh pain. Recommended to continue celebrex .    Past Medical History:  Diagnosis Date   Aortic atherosclerosis    Arthritis    Carotid arterial disease    a. 11/2017 Carotid U/S: RICA 1-39%, LICA 40-59%.    Chronic fatigue    Chronic heart failure with preserved ejection fraction (HCC)    Chronic leg pain    Complete tear of right rotator cuff    Coronary artery disease involving native coronary artery of native heart without angina pectoris    DDD (degenerative disc disease), lumbar    Dyspnea on exertion    a. 02/2017 Echo: EF 60-65%, no rwma, mild MR. Nl RV size/fxn. Nl PASP; b. 02/2017 MV: small, mild, fixed basal inferolateral defect - ? artifact vs scar. No ischemia. EF >65%.   Hypercholesterolemia    Hypertension    Insomnia    Long-term use of aspirin  therapy    Mild cognitive impairment    Palpitations    a. 02/2017 Holter: Avg HR 66 (51-115), rare PACs, four brief runs of PAT - up to 5 beats, max rate 157. Rare isolated PVCs. No sustained arrhythmias; b. 03/2018 24h Holter: Rare PACs, 3 beat run PAT (122 bpm), PVCs (2% of beats).   Radiculopathy, lumbar region    Sinus bradycardia    Skin cancer    Spondylolisthesis of lumbosacral region    Past Surgical History:  Procedure Laterality Date   ABDOMINAL EXPOSURE N/A 11/10/2018   Procedure: ABDOMINAL EXPOSURE;  Surgeon: Eliza Lonni RAMAN, MD;  Location: [facility] OR;  Service: Vascular;  Laterality: N/A;   ABDOMINAL HYSTERECTOMY  10/21/1973   ANTERIOR CERVICAL DECOMP/DISCECTOMY FUSION N/A 04/09/2013   Procedure: ANTERIOR CERVICAL DECOMPRESSION/DISCECTOMY FUSION 2 LEVELS;  Surgeon:  Catalina CHRISTELLA Stains, MD;  Location: [facility];  Service: Neurosurgery;  Laterality: N/A;  Cervical five-six Cervical six-seven  Anterior cervical decompression/diskectomy/fusion   ANTERIOR LUMBAR FUSION N/A 11/10/2018   Procedure: Anterior Lumbar Interbody Fusion-Lumbar five-Sacral one;  Surgeon: Louis Shove, MD;  Location: [facility] OR;  Service: Neurosurgery;  Laterality: N/A;   BACK SURGERY     ruptured disc   RECONSTRUCTION OF NOSE     REVERSE SHOULDER ARTHROPLASTY Right 02/05/2024   Procedure: ARTHROPLASTY, SHOULDER, TOTAL, REVERSE;  Surgeon: Edie Norleen PARAS, MD;  Location: [facility] ORS;  Service: Orthopedics;  Laterality: Right;   skin lesion removed Left 04/2024   left arm   Family History  Problem Relation Age of Onset   Cancer Mother        ovarian/uterine   Heart disease Mother    Heart attack Mother    Stroke Mother    Colon cancer Father    Dementia Father    COPD Brother    Congestive Heart Failure Brother    Stroke Daughter    Heart attack Daughter    Suicidality Brother    Breast cancer Neg Hx    Social History   Socioeconomic History   Marital status: Married    Spouse name: Not on file   Number of children: 2   Years of education: Not  on file   Highest education level: 12th grade  Occupational History   Not on file  Tobacco Use   Smoking status: Never   Smokeless tobacco: Never  Vaping Use   Vaping status: Never Used  Substance and Sexual Activity   Alcohol use: Not Currently    Alcohol/week: 0.0 standard drinks of alcohol    Comment: rarely   Drug use: No   Sexual activity: Yes  Other Topics Concern   Not on file  Social History Narrative   ** Merged History Encounter **    Married   Social Drivers of Health   Tobacco Use: Low Risk (11/15/2024)   Patient History    Smoking Tobacco Use: Never    Smokeless Tobacco Use: Never    Passive Exposure: Not on file  Financial Resource Strain: Low Risk (11/25/2024)   Overall Financial Resource Strain (CARDIA)     Difficulty of Paying Living Expenses: Not hard at all  Food Insecurity: No Food Insecurity (11/25/2024)   Epic    Worried About Radiation Protection Practitioner of Food in the Last Year: Never true    Ran Out of Food in the Last Year: Never true  Transportation Needs: No Transportation Needs (11/25/2024)   Epic    Lack of Transportation (Medical): No    Lack of Transportation (Non-Medical): No  Physical Activity: Unknown (11/25/2024)   Exercise Vital Sign    Days of Exercise per Week: Patient declined    Minutes of Exercise per Session: Not on file  Stress: Patient Declined (11/25/2024)   [employer] of Occupational Health - Occupational Stress Questionnaire    Feeling of Stress: Patient declined  Social Connections: Socially Integrated (11/25/2024)   Social Connection and Isolation Panel    Frequency of Communication with Friends and Family: More than three times a week    Frequency of Social Gatherings with Friends and Family: More than three times a week    Attends Religious Services: More than 4 times per year    Active Member of Clubs or Organizations: Yes    Attends Banker Meetings: More than 4 times per year    Marital Status: Married  Depression (PHQ2-9): Low Risk (08/19/2024)   Depression (PHQ2-9)    PHQ-2 Score: 0  Alcohol Screen: Low Risk (05/18/2024)   Alcohol Screen    Last Alcohol Screening Score (AUDIT): 0  Housing: Low Risk (11/25/2024)   Epic    Unable to Pay for Housing in the Last Year: No    Number of Times Moved in the Last Year: 0    Homeless in the Last Year: No  Utilities: Not At Risk (10/27/2024)   Received from [facility]   Epic    In the past 12 months has the electric, gas, oil, or water company threatened to shut off services in your home?: No  Health Literacy: Adequate Health Literacy (05/18/2024)   B1300 Health Literacy    Frequency of need for help with medical instructions: Never     Review of Systems     Objective:     There were  no vitals taken for this visit. Wt Readings from Last 3 Encounters:  10/21/24 149 lb (67.6 kg)  08/19/24 147 lb (66.7 kg)  08/06/24 145 lb 9.6 oz (66 kg)    Physical Exam  {Perform Simple Foot Exam  Perform Detailed exam:1} {Insert foot Exam (Optional):30965}   Outpatient Encounter Medications as of 11/26/2024  Medication Sig   acetaminophen  (TYLENOL ) 650 MG  CR tablet Take 650 mg by mouth in the morning.   albuterol  (VENTOLIN  HFA) 108 (90 Base) MCG/ACT inhaler Inhale 1-2 puffs into the lungs every 6 (six) hours as needed for wheezing or shortness of breath.   amLODipine  (NORVASC ) 2.5 MG tablet TAKE ONE TABLET BY MOUTH ONCE DAILY   aspirin  EC 81 MG tablet Take 81 mg by mouth daily with lunch.   celecoxib  (CELEBREX ) 100 MG capsule Take 100 mg by mouth.   ezetimibe  (ZETIA ) 10 MG tablet TAKE ONE TABLET BY MOUTH ONCE DAILY   gabapentin  (NEURONTIN ) 100 MG capsule Take 1 capsule (100 mg total) by mouth daily.   losartan -hydrochlorothiazide  (HYZAAR) 100-25 MG tablet TAKE ONE TABLET BY MOUTH ONCE DAILY   memantine (NAMENDA) 5 MG tablet Take 5 mg by mouth daily.   Multiple Vitamins-Minerals (CENTRUM ADULTS PO) Take 1 capsule by mouth in the morning.   pantoprazole  (PROTONIX ) 40 MG tablet TAKE ONE TABLET BY MOUTH EVERY DAY   pravastatin  (PRAVACHOL ) 10 MG tablet Take 1 tablet (10 mg total) by mouth every Monday, Wednesday, and Friday.   senna-docusate (SENOKOT-S) 8.6-50 MG tablet Take 1 tablet by mouth daily as needed for mild constipation or moderate constipation.   SODIUM FLUORIDE 5000 PPM 1.1 % PSTE daily.   No facility-administered encounter medications on file as of 11/26/2024.     Lab Results  Component Value Date   WBC 5.3 06/17/2024   HGB 12.0 06/17/2024   HCT 37.2 06/17/2024   PLT 244.0 06/17/2024   GLUCOSE 94 09/15/2024   CHOL 203 (H) 09/15/2024   TRIG 135.0 09/15/2024   HDL 58.70 09/15/2024   LDLDIRECT 151.7 08/16/2013   LDLCALC 117 (H) 09/15/2024   ALT 14 09/15/2024   AST 15  09/15/2024   NA 138 09/15/2024   K 4.1 09/15/2024   CL 100 09/15/2024   CREATININE 0.70 09/15/2024   BUN 19 09/15/2024   CO2 30 09/15/2024   TSH 3.53 06/17/2024   HGBA1C 6.0 09/15/2024    DG Abdomen 1 View Result Date: 11/15/2024 EXAM: 1 VIEW XRAY OF THE ABDOMEN 11/15/2024 11:05:43 AM COMPARISON: 05/24/2020. CLINICAL HISTORY: Lower abdominal pain and constipation for a while. FINDINGS: BOWEL: Nonobstructive bowel gas pattern. Mild colonic stool burden predominantly within the left hemicolon. SOFT TISSUES: Pelvic phleboliths. BONES: L5-S1 disc fusion. Degenerative changes of the lumbar spine. Levoconvex lumbar scoliosis. Mild-to-moderate bilateral hip osteoarthritis. IMPRESSION: 1. Mild colonic stool burden predominantly within the left hemicolon. Electronically signed by: Waddell Calk MD 11/15/2024 11:21 AM EST RP Workstation: [id number]       Assessment & Plan:  Hyperlipidemia LDL goal <70     Allena Hamilton, MD "

## 2024-11-29 ENCOUNTER — Ambulatory Visit: Admitting: Speech Pathology

## 2024-12-01 ENCOUNTER — Ambulatory Visit: Admitting: Speech Pathology

## 2024-12-06 ENCOUNTER — Ambulatory Visit: Admitting: Speech Pathology

## 2024-12-08 ENCOUNTER — Ambulatory Visit: Admitting: Speech Pathology

## 2024-12-10 ENCOUNTER — Ambulatory Visit: Admitting: Internal Medicine

## 2024-12-13 ENCOUNTER — Ambulatory Visit: Admitting: Speech Pathology

## 2024-12-15 ENCOUNTER — Ambulatory Visit: Admitting: Speech Pathology

## 2024-12-20 ENCOUNTER — Ambulatory Visit: Admitting: Speech Pathology

## 2024-12-22 ENCOUNTER — Ambulatory Visit: Admitting: Speech Pathology

## 2024-12-27 ENCOUNTER — Ambulatory Visit: Admitting: Speech Pathology

## 2024-12-29 ENCOUNTER — Ambulatory Visit: Admitting: Speech Pathology

## 2024-12-29 ENCOUNTER — Encounter (INDEPENDENT_AMBULATORY_CARE_PROVIDER_SITE_OTHER)

## 2024-12-29 ENCOUNTER — Ambulatory Visit (INDEPENDENT_AMBULATORY_CARE_PROVIDER_SITE_OTHER): Admitting: Nurse Practitioner

## 2025-01-03 ENCOUNTER — Ambulatory Visit: Admitting: Speech Pathology

## 2025-01-05 ENCOUNTER — Ambulatory Visit: Admitting: Speech Pathology

## 2025-01-10 ENCOUNTER — Ambulatory Visit: Admitting: Speech Pathology

## 2025-01-12 ENCOUNTER — Ambulatory Visit: Admitting: Speech Pathology

## 2025-01-17 ENCOUNTER — Ambulatory Visit: Admitting: Speech Pathology

## 2025-01-19 ENCOUNTER — Ambulatory Visit: Admitting: Speech Pathology

## 2025-01-24 ENCOUNTER — Ambulatory Visit: Admitting: Speech Pathology

## 2025-01-25 ENCOUNTER — Ambulatory Visit: Admitting: Internal Medicine

## 2025-01-26 ENCOUNTER — Ambulatory Visit: Admitting: Speech Pathology

## 2025-01-28 ENCOUNTER — Ambulatory Visit: Admitting: Internal Medicine

## 2025-01-31 ENCOUNTER — Ambulatory Visit: Admitting: Speech Pathology

## 2025-02-02 ENCOUNTER — Ambulatory Visit: Admitting: Speech Pathology

## 2025-05-24 ENCOUNTER — Ambulatory Visit
